# Patient Record
Sex: Female | Born: 1960
Health system: Southern US, Community
[De-identification: ages and names within clinical notes are randomized; demographics above are authoritative.]

## PROBLEM LIST (undated history)

## (undated) DIAGNOSIS — F419 Anxiety disorder, unspecified: Secondary | ICD-10-CM

## (undated) DIAGNOSIS — R011 Cardiac murmur, unspecified: Secondary | ICD-10-CM

## (undated) DIAGNOSIS — I10 Essential (primary) hypertension: Secondary | ICD-10-CM

## (undated) DIAGNOSIS — E785 Hyperlipidemia, unspecified: Secondary | ICD-10-CM

## (undated) DIAGNOSIS — K219 Gastro-esophageal reflux disease without esophagitis: Secondary | ICD-10-CM

## (undated) DIAGNOSIS — K8010 Calculus of gallbladder with chronic cholecystitis without obstruction: Secondary | ICD-10-CM

## (undated) DIAGNOSIS — R079 Chest pain, unspecified: Secondary | ICD-10-CM

## (undated) DIAGNOSIS — C801 Malignant (primary) neoplasm, unspecified: Secondary | ICD-10-CM

## (undated) HISTORY — PX: UPPER GASTROINTESTINAL ENDOSCOPY: SHX188

## (undated) HISTORY — DX: Malignant (primary) neoplasm, unspecified: C80.1

## (undated) HISTORY — DX: Calculus of gallbladder with chronic cholecystitis without obstruction: K80.10

## (undated) HISTORY — DX: Gastro-esophageal reflux disease without esophagitis: K21.9

## (undated) HISTORY — PX: COLONOSCOPY: SHX174

## (undated) HISTORY — PX: ABDOMINAL HYSTERECTOMY: SHX81

## (undated) HISTORY — DX: Chest pain, unspecified: R07.9

## (undated) HISTORY — PX: ABDOMINAL SURGERY: SHX537

## (undated) HISTORY — DX: Cardiac murmur, unspecified: R01.1

---

## 1997-07-25 ENCOUNTER — Ambulatory Visit (HOSPITAL_COMMUNITY): Admission: RE | Admit: 1997-07-25 | Discharge: 1997-07-25 | Payer: Self-pay | Admitting: *Deleted

## 1997-10-13 ENCOUNTER — Encounter: Admission: RE | Admit: 1997-10-13 | Discharge: 1998-01-11 | Payer: Self-pay | Admitting: Obstetrics and Gynecology

## 1997-11-17 ENCOUNTER — Inpatient Hospital Stay (HOSPITAL_COMMUNITY): Admission: AD | Admit: 1997-11-17 | Discharge: 1997-11-17 | Payer: Self-pay | Admitting: Obstetrics and Gynecology

## 1997-12-12 ENCOUNTER — Encounter (HOSPITAL_COMMUNITY): Admission: RE | Admit: 1997-12-12 | Discharge: 1997-12-28 | Payer: Self-pay | Admitting: Obstetrics and Gynecology

## 1997-12-19 ENCOUNTER — Inpatient Hospital Stay (HOSPITAL_COMMUNITY): Admission: AD | Admit: 1997-12-19 | Discharge: 1997-12-19 | Payer: Self-pay | Admitting: Obstetrics and Gynecology

## 1997-12-28 ENCOUNTER — Inpatient Hospital Stay (HOSPITAL_COMMUNITY): Admission: AD | Admit: 1997-12-28 | Discharge: 1997-12-30 | Payer: Self-pay | Admitting: Obstetrics and Gynecology

## 1998-02-27 ENCOUNTER — Ambulatory Visit (HOSPITAL_COMMUNITY): Admission: RE | Admit: 1998-02-27 | Discharge: 1998-02-27 | Payer: Self-pay | Admitting: Obstetrics and Gynecology

## 1999-08-25 ENCOUNTER — Emergency Department (HOSPITAL_COMMUNITY): Admission: EM | Admit: 1999-08-25 | Discharge: 1999-08-25 | Payer: Self-pay | Admitting: Emergency Medicine

## 1999-10-24 ENCOUNTER — Ambulatory Visit (HOSPITAL_COMMUNITY): Admission: RE | Admit: 1999-10-24 | Discharge: 1999-10-24 | Payer: Self-pay | Admitting: Gastroenterology

## 1999-11-28 ENCOUNTER — Other Ambulatory Visit: Admission: RE | Admit: 1999-11-28 | Discharge: 1999-11-28 | Payer: Self-pay | Admitting: Obstetrics and Gynecology

## 2002-07-05 ENCOUNTER — Other Ambulatory Visit: Admission: RE | Admit: 2002-07-05 | Discharge: 2002-07-05 | Payer: Self-pay | Admitting: Obstetrics and Gynecology

## 2003-08-16 ENCOUNTER — Ambulatory Visit (HOSPITAL_COMMUNITY): Admission: RE | Admit: 2003-08-16 | Discharge: 2003-08-16 | Payer: Self-pay | Admitting: Obstetrics and Gynecology

## 2003-08-21 ENCOUNTER — Other Ambulatory Visit: Admission: RE | Admit: 2003-08-21 | Discharge: 2003-08-21 | Payer: Self-pay | Admitting: Obstetrics and Gynecology

## 2004-10-29 ENCOUNTER — Emergency Department (HOSPITAL_COMMUNITY): Admission: EM | Admit: 2004-10-29 | Discharge: 2004-10-30 | Payer: Self-pay | Admitting: Emergency Medicine

## 2004-12-18 ENCOUNTER — Other Ambulatory Visit: Admission: RE | Admit: 2004-12-18 | Discharge: 2004-12-18 | Payer: Self-pay | Admitting: Obstetrics and Gynecology

## 2005-01-28 ENCOUNTER — Observation Stay (HOSPITAL_COMMUNITY): Admission: AD | Admit: 2005-01-28 | Discharge: 2005-01-29 | Payer: Self-pay | Admitting: Obstetrics and Gynecology

## 2005-02-04 ENCOUNTER — Encounter (INDEPENDENT_AMBULATORY_CARE_PROVIDER_SITE_OTHER): Payer: Self-pay | Admitting: *Deleted

## 2005-02-04 ENCOUNTER — Observation Stay (HOSPITAL_COMMUNITY): Admission: RE | Admit: 2005-02-04 | Discharge: 2005-02-05 | Payer: Self-pay | Admitting: Obstetrics and Gynecology

## 2005-08-14 ENCOUNTER — Ambulatory Visit (HOSPITAL_COMMUNITY): Admission: RE | Admit: 2005-08-14 | Discharge: 2005-08-14 | Payer: Self-pay | Admitting: Obstetrics and Gynecology

## 2005-08-14 IMAGING — MG MM DIGITAL SCREENING BILAT
5 series · 5 of 5 positions shown · non-contrast
Comparison: none

Bilateral CC and MLO view(s) were taken.

SCREENING MAMMOGRAM:
There is a fibroglandular pattern.  No masses or malignant type calcifications are identified.  
Compared with prior studies.

[R CC]
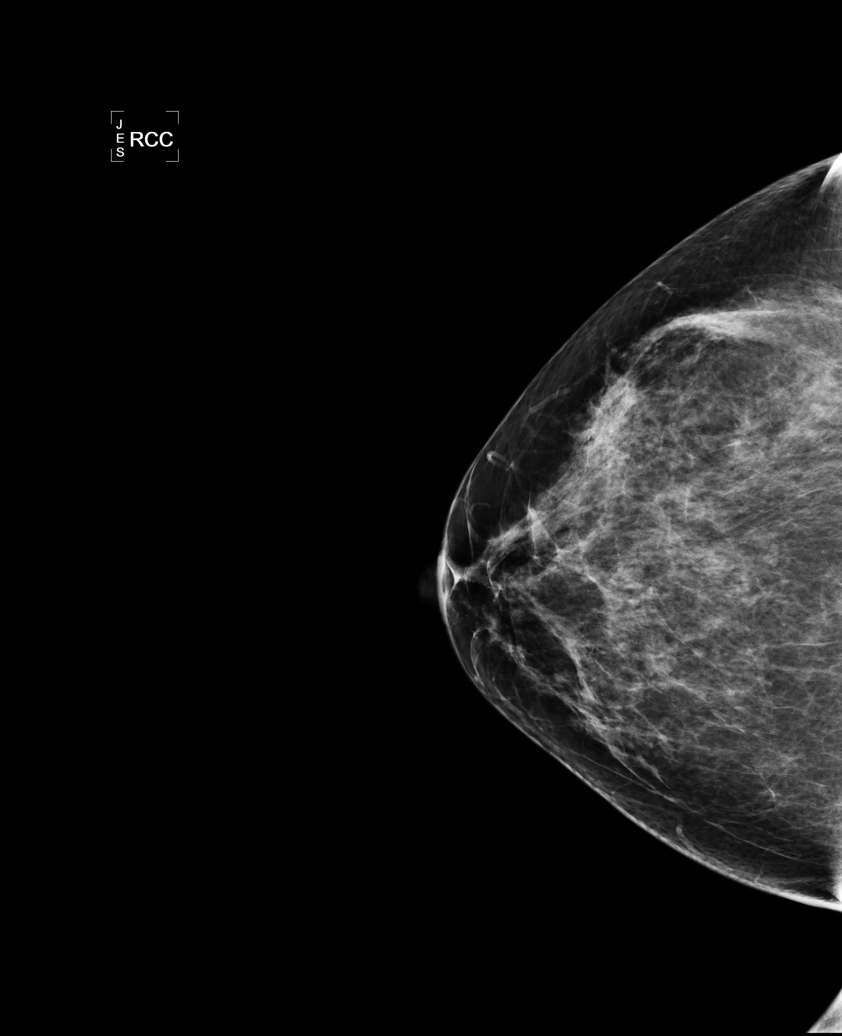

[R MLO]
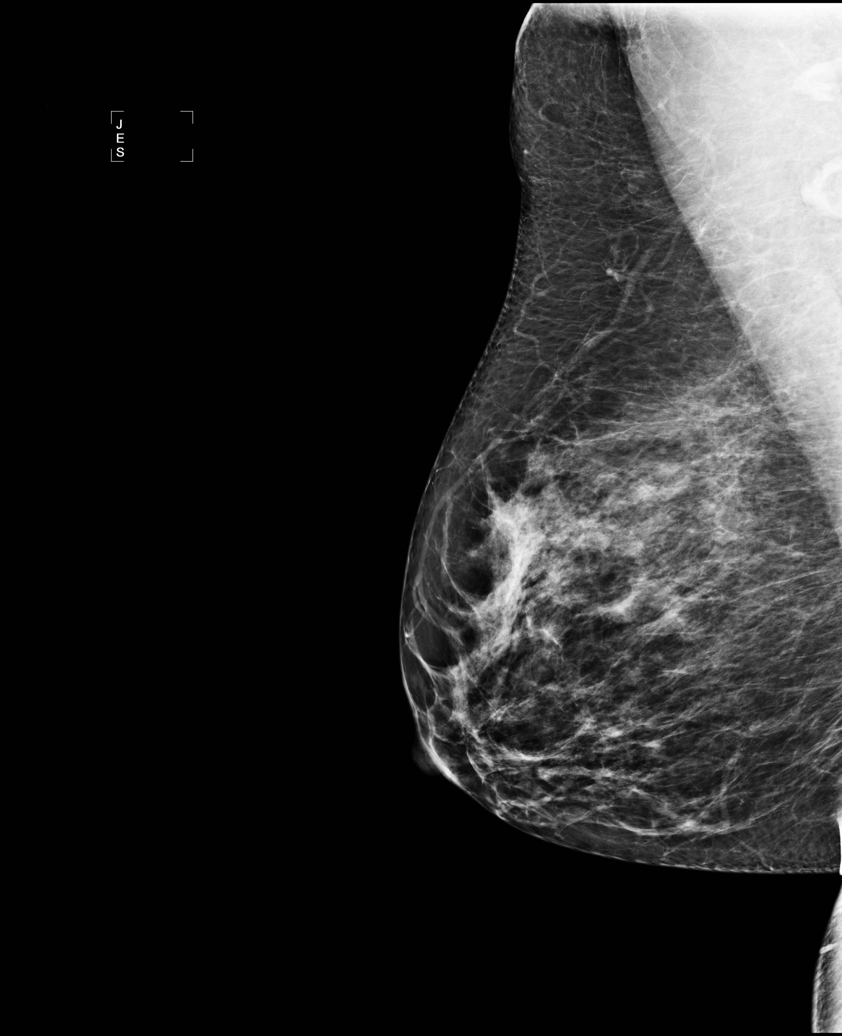

[L CC]
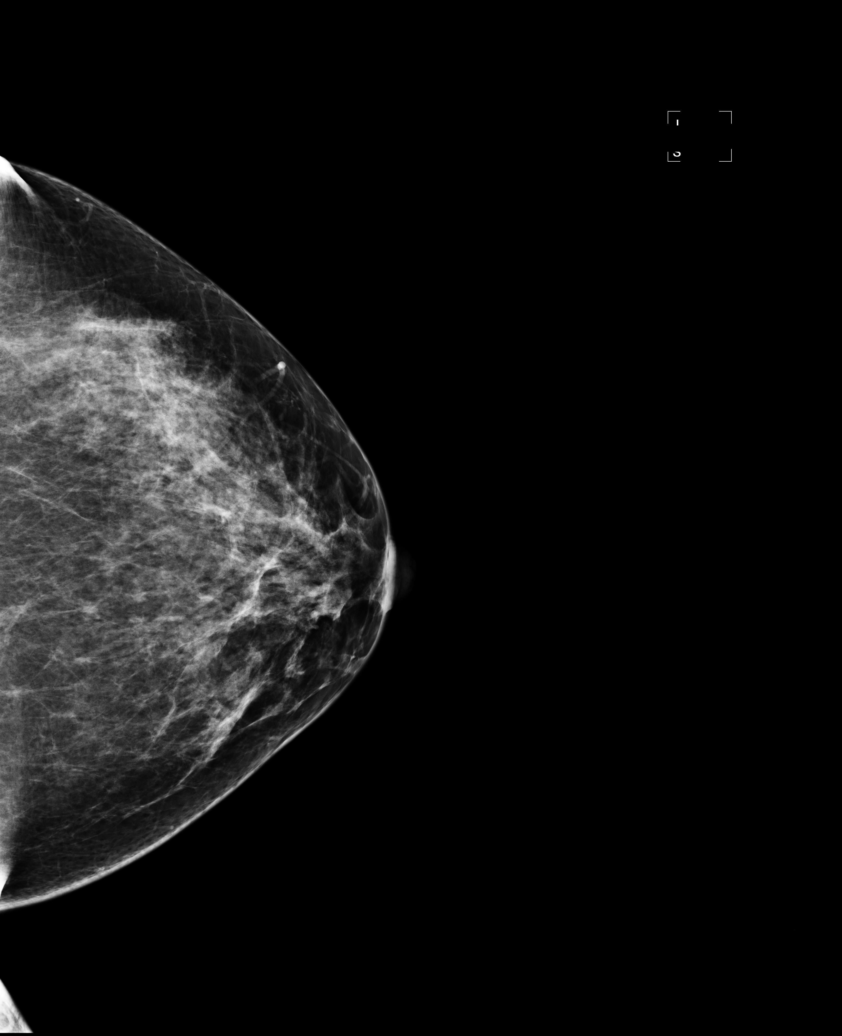

[L MLO (1 of 2)]
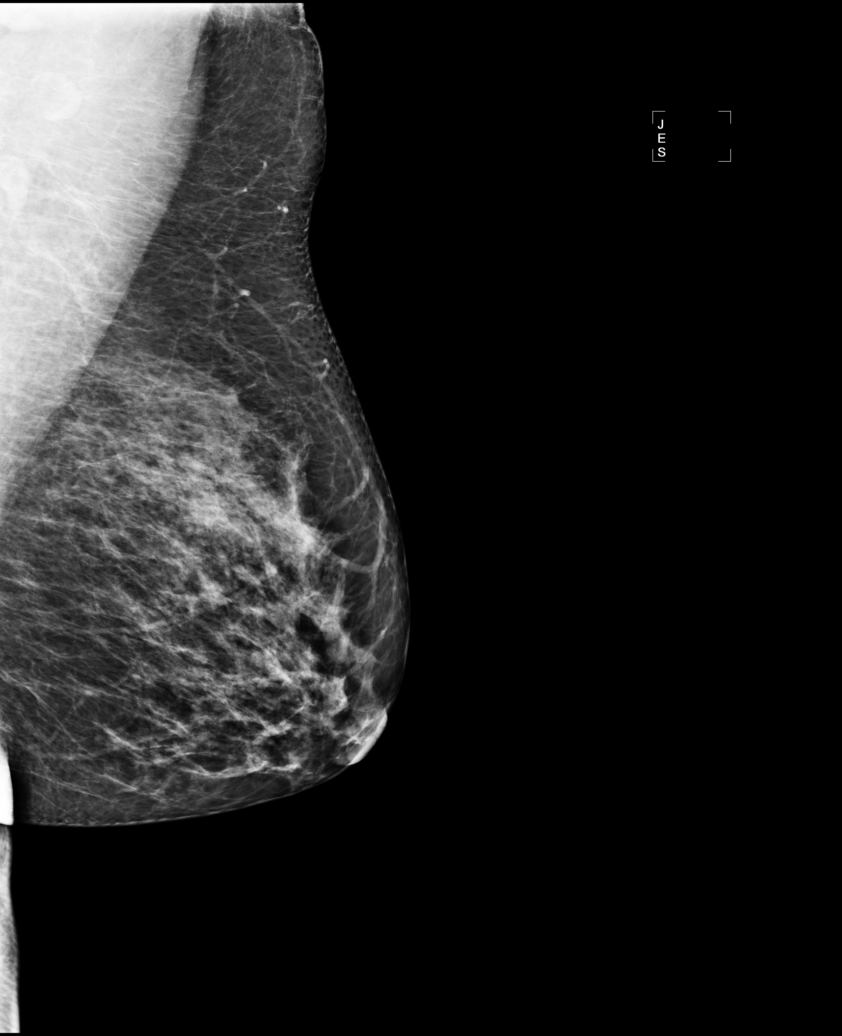

[L MLO (2 of 2)]
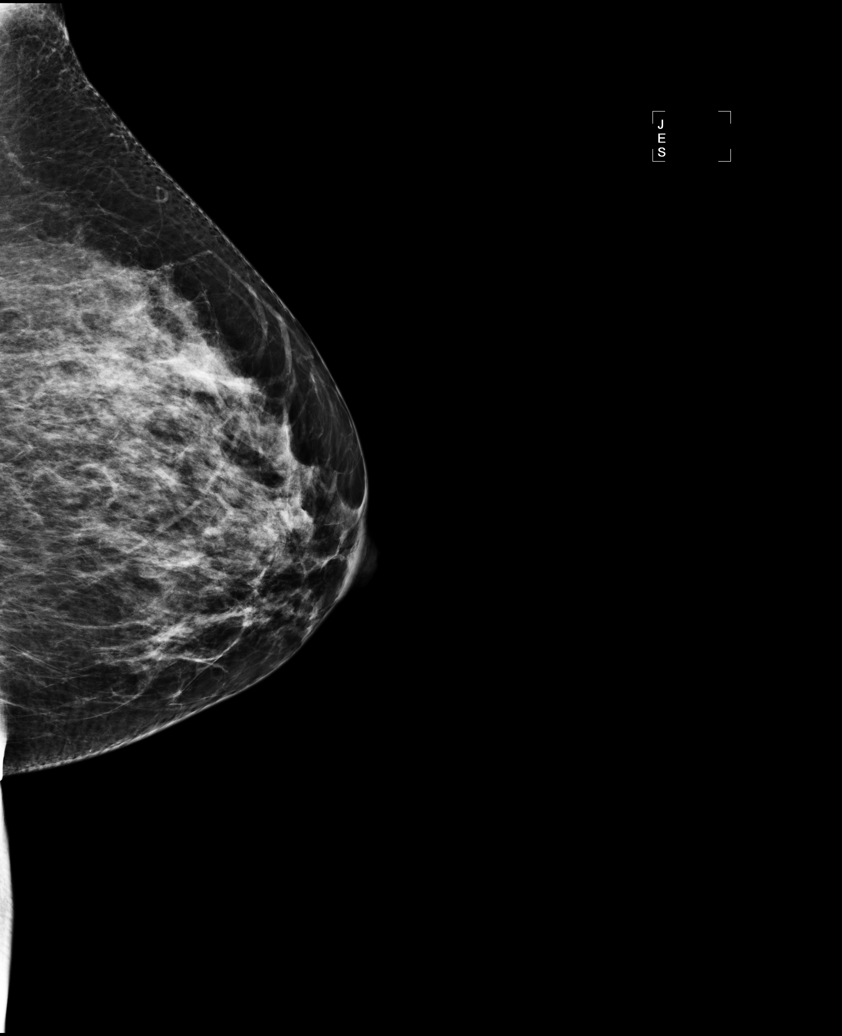

[5 of 5 positions shown; findings below may reference images not displayed]

IMPRESSION: No specific mammographic evidence of malignancy.  Next screening mammogram is recommended in one 
year.

ASSESSMENT: Negative - BI-RADS 1

Screening mammogram in 1 year.

## 2005-09-01 ENCOUNTER — Other Ambulatory Visit: Admission: RE | Admit: 2005-09-01 | Discharge: 2005-09-01 | Payer: Self-pay | Admitting: Obstetrics and Gynecology

## 2008-08-12 ENCOUNTER — Emergency Department (HOSPITAL_COMMUNITY): Admission: EM | Admit: 2008-08-12 | Discharge: 2008-08-12 | Payer: Self-pay | Admitting: Emergency Medicine

## 2008-08-16 ENCOUNTER — Other Ambulatory Visit: Admission: RE | Admit: 2008-08-16 | Discharge: 2008-08-16 | Payer: Self-pay | Admitting: Obstetrics and Gynecology

## 2008-08-16 ENCOUNTER — Ambulatory Visit: Payer: Self-pay | Admitting: Obstetrics and Gynecology

## 2008-08-16 LAB — CONVERTED CEMR LAB: Trich, Wet Prep: NONE SEEN

## 2008-09-07 ENCOUNTER — Ambulatory Visit: Payer: Self-pay | Admitting: Obstetrics and Gynecology

## 2010-04-03 ENCOUNTER — Encounter
Admission: RE | Admit: 2010-04-03 | Discharge: 2010-04-03 | Payer: Self-pay | Source: Home / Self Care | Admitting: Obstetrics and Gynecology

## 2010-04-03 IMAGING — MG MM DIGITAL SCREENING
5 series · 5 of 5 positions shown · non-contrast
Comparison: none

DG SCREEN MAMMOGRAM BILATERAL
Bilateral CC and MLO view(s) were taken.

DIGITAL SCREENING MAMMOGRAM WITH CAD:
There are scattered fibroglandular densities.  No masses or malignant type calcifications are 
identified.  Compared with prior studies.
Images were processed with CAD.

[R CC]
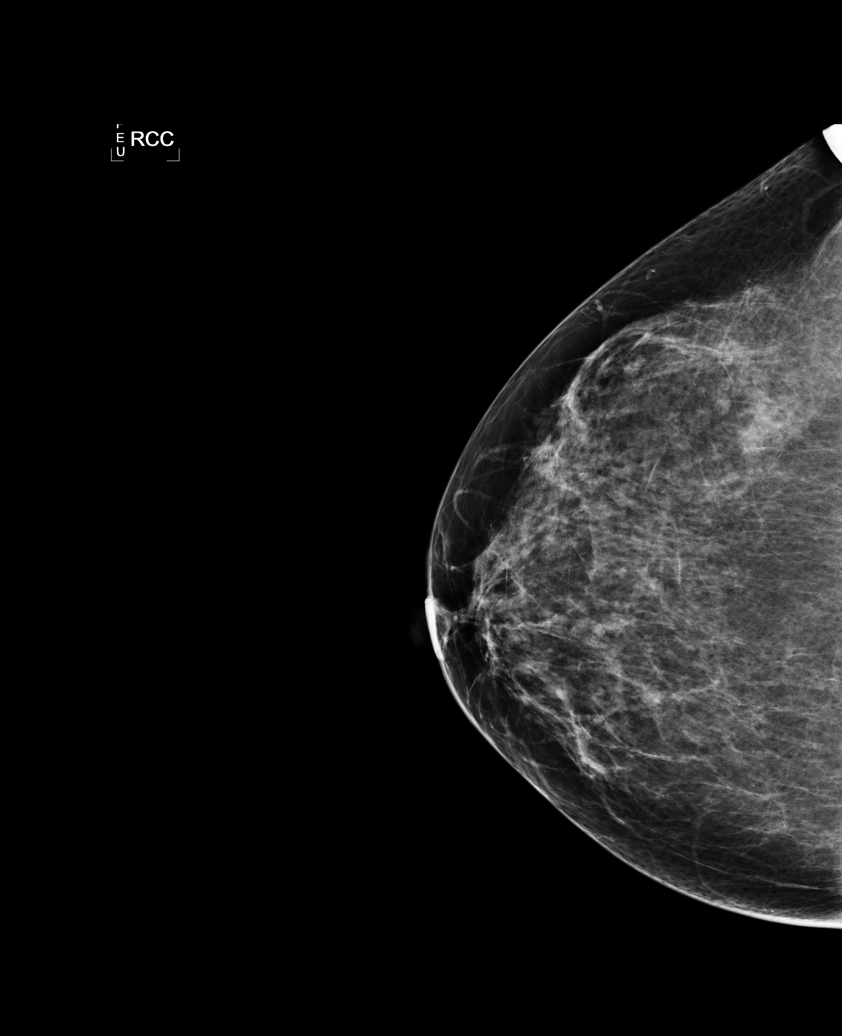

[L CC]
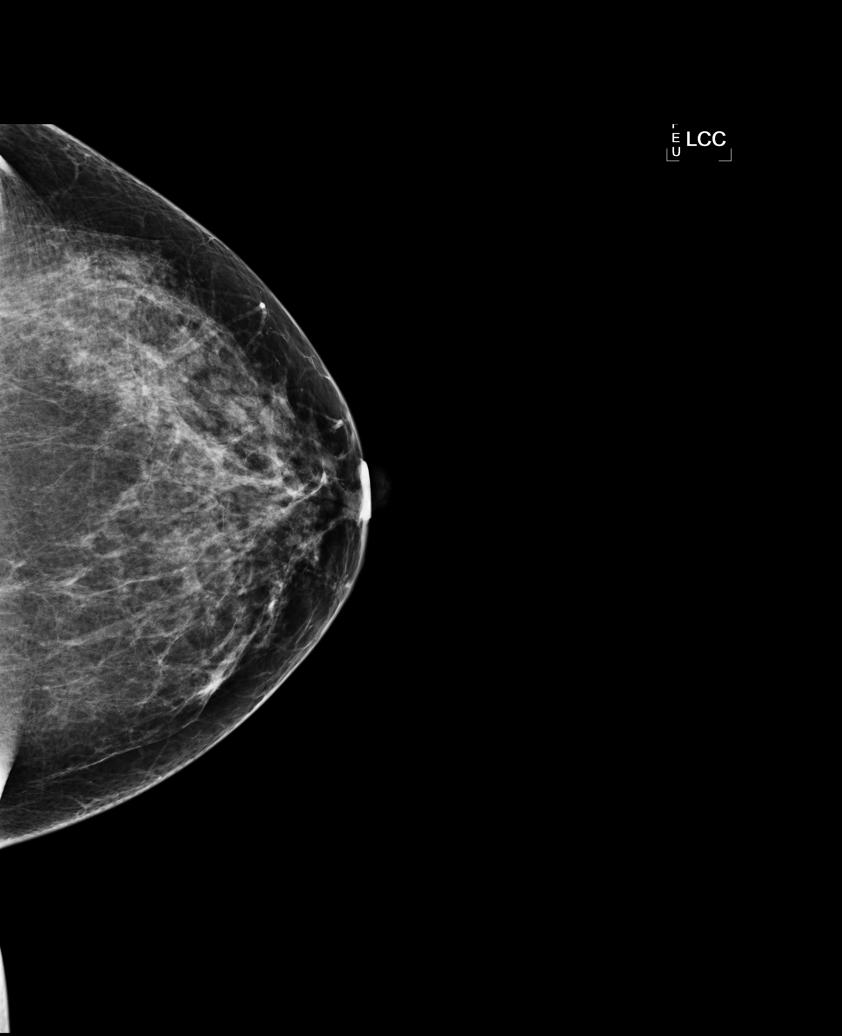

[L MLO (1 of 2)]
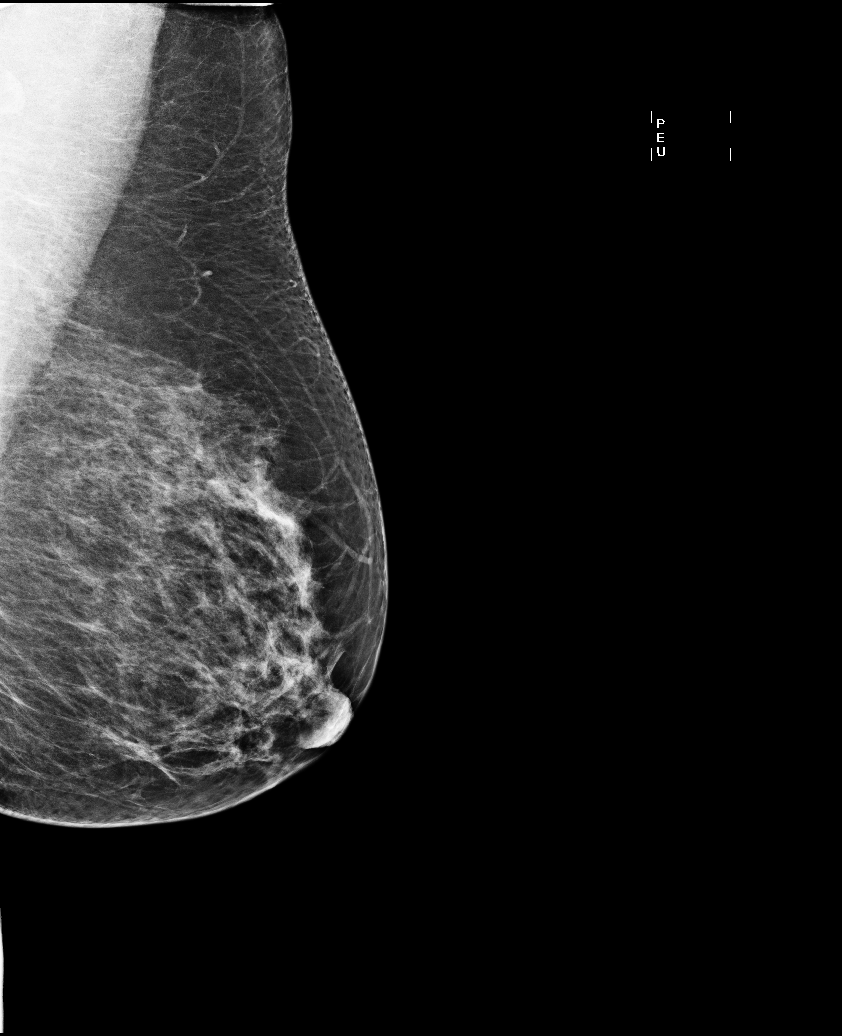

[R MLO]
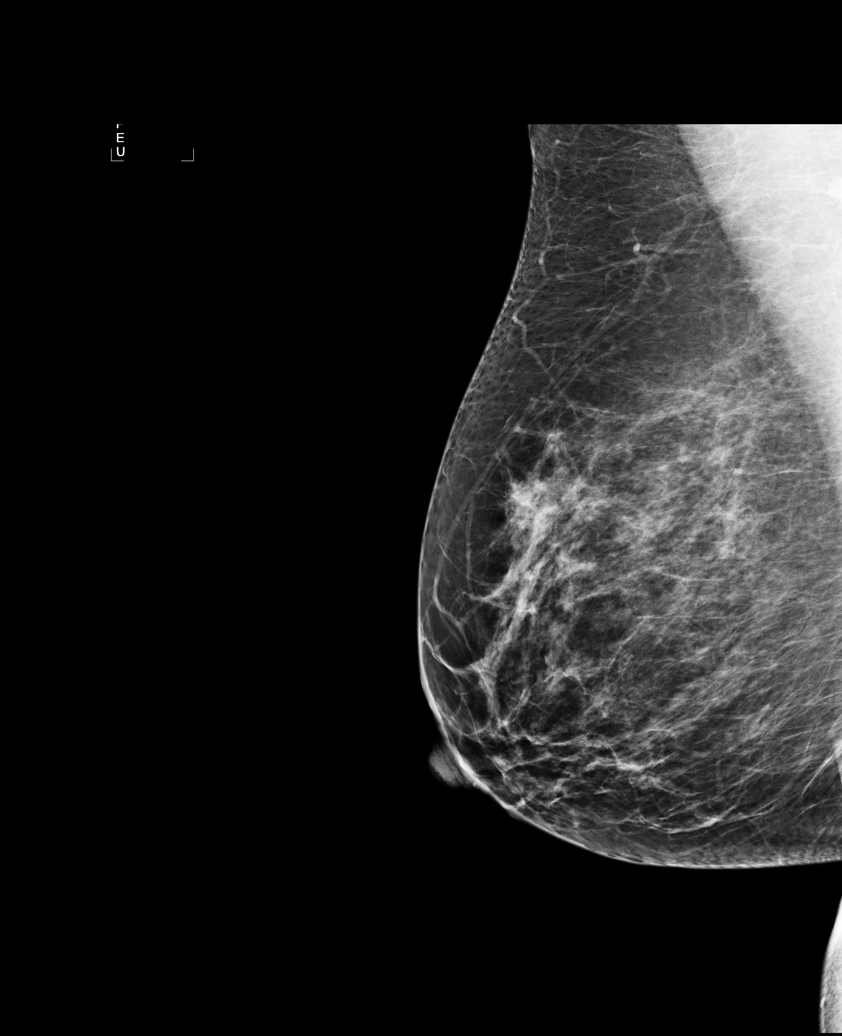

[L MLO (2 of 2)]
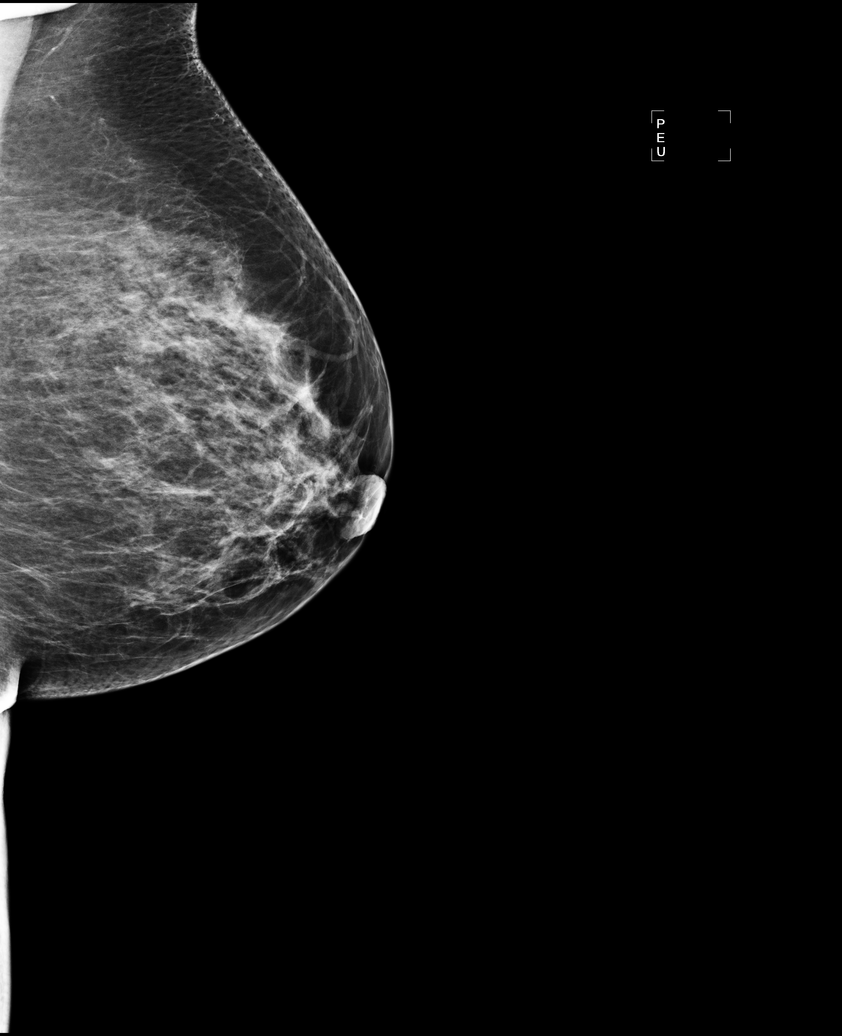

[5 of 5 positions shown; findings below may reference images not displayed]

IMPRESSION: No specific mammographic evidence of malignancy.  Next screening mammogram is recommended in one 
year.

A result letter of this screening mammogram will be mailed directly to the patient.

ASSESSMENT: Negative - BI-RADS 1

Screening mammogram in 1 year.
,

## 2010-08-07 LAB — WET PREP, GENITAL
Clue Cells Wet Prep HPF POC: NONE SEEN
Trich, Wet Prep: NONE SEEN
Yeast Wet Prep HPF POC: NONE SEEN

## 2010-09-10 NOTE — Group Therapy Note (Signed)
NAME:  Christina Burke, Christina Burke NO.:  [id number]   MEDICAL RECORD NO.:  [id number]          PATIENT TYPE:  WOC   LOCATION:  [facility]                   FACILITY:  [facility]   PHYSICIAN:  Deirdre Christy Gentles, CNM       DATE OF BIRTH:  21-Aug-1960   DATE OF SERVICE:                                  CLINIC NOTE   REASON FOR VISIT:  She is here for a vulvar biopsy after being evaluated  at [facility].   HISTORY:  This is a 50 year old G3, P 2-0-1-2 who presented to [facility] with a 79-month history of itching of the  perivulvar region and white skin discoloration.  She self-treated with  OTC antifungal medications with no relief.  She was seen 08/10/2008 at  [facility] and was noted to have cyst at the vaginal cuff and  vulvar perineal lesions that were thought to be vitiligo versus lichen  sclerosus.  Dr. Penne Lash examined her there and felt that she needed  biopsy.   ALLERGIES:  AMOXICILLIN.   CURRENT MEDICATIONS:  1. Clonazepam.  2. Unknown type of blood pressure pill.   IMMUNIZATIONS:  Usual childhood immunizations.   Surgical menopause in 2006.  No vaginal bleeding.   GYNECOLOGIC HISTORY:  Significant for having had a LAVH by Dr. Jackelyn Knife  for abnormal uterine bleeding and pathology report showed endometrioid  carcinoma.  No other surgeries.   FAMILY HISTORY:  Significant for the parents and maternal grandmother  for heart disease.  Father and grandmother with MI.  Hypertension in  mother, father, and brother.  Cancer in both parents.  Blood clots in  father and sister.   PAST MEDICAL HISTORY:  Significant for heart murmur, hypertension and  endometrioid carcinoma as above.   SOCIAL HISTORY:  Does not work outside the home.  Lives with 2 sons age  47 and 25.  Nonsmoker.  Drinks occasional alcohol, caffeine once a day.  Negative history of illicit drug use.   REVIEW OF SYSTEMS:  Positive for some mild SUI and feeling short  of  breath during the periods of anxiety and some night sweats, but no  severe hot flashes.   PHYSICAL EXAMINATION:  Physical exam was done August 10, 2008.  Today her  blood pressure is 142/87, her weight is 210 and her height 5 feet 3  inches.  She is afebrile.  Dr. Okey Dupre was present for speculum exam and  exam of external genitalia.  Vaginal cuff is well visualized, thought to  be some small inclusion cyst posteriorly, for which the patient is  reassured, is of no real concerns.  Both labia majora have white skin  discoloration and some neurodermatitis from scratching.  This whiteness  extends to perineum.  In consultation with Dr. Okey Dupre.  Biopsy was done,  the left labium majus at fourchette.   PROCEDURE NOTE:  Using sterile technique the area is cleansed with  Betadine and it is then infiltrated with 1% Xylocaine.  A punch biopsy  was undertaken and with take pickups grasping about 2 mm area, which  easily twisted off.  There was slight bleeding and silver nitrate stick  used  to achieve hemostasis.  The patient tolerated procedure very well.   ASSESSMENT:  Vulvar dystrophy.   PLAN:  Await path results and have the patient return in 2 weeks.  She  should return if she has severe bleeding or pain from biopsy site.  We  will also find the results of her path of cervical cuff as well as GC  chlamydia, which were done August 10, 2008 at [facility].           ______________________________  Caren Griffins, CNM     DP/MEDQ  D:  08/16/2008  T:  08/17/2008  Job:  [id number]

## 2010-09-13 NOTE — H&P (Signed)
Christina Burke, Christina Burke              ACCOUNT NO.:  [id number]   MEDICAL RECORD NO.:  [id number]           PATIENT TYPE:   LOCATION:                                 FACILITY:   PHYSICIAN:  Zenaida Niece, M.D.DATE OF BIRTH:  [birth year]   DATE OF ADMISSION:  02/04/2005  DATE OF DISCHARGE:                                HISTORY & PHYSICAL   CHIEF COMPLAINT:  Menorrhagia with anemia and possible fibroids.   HISTORY OF PRESENT ILLNESS:  This is a 50 year old, white female, P2-0-2-2  who was seen for an annual exam on December 18, 2004.  At that time, she was  saying that she had menses every month which were fairly heavy and that she  had recently bled for approximately 6 weeks.  She does have a ParaGard IUD  in proper position.  Hemoglobin at that visit was 7.7.  She was counseled on  all options and did decide to proceed with definitive surgical therapy.  At  her preop visit on October 3, she complained of significant shortness of  breath and she had just finished a menses.  Hemoglobin in the office was  6.5.  She was thus sent to the hospital and transfused with 3 units of  packed red blood cells in preparation for surgery.   PAST OBSTETRICAL HISTORY:  Significant for two vaginal deliveries at term  without complications.  She did have gestational diabetes with both  pregnancies.  She has also had one miscarriage and one elected termination.   PAST MEDICAL HISTORY:  1.  Remote history of a heart murmur.  2.  She does have recent depression.   PAST SURGICAL HISTORY:  She has had a laparoscopy in 1993, for infertility  and denies other surgeries.   ALLERGIES:  PENICILLIN causes a rash.   CURRENT MEDICATIONS:  Paxil 20 mg daily.   FAMILY HISTORY:  Mother had uterine cancer.  Maternal aunt had breast  cancer.   REVIEW OF SYSTEMS:  She does have some urinary frequency and normal bowel  movements.   SOCIAL HISTORY:  She is married and denies alcohol, tobacco or drug use.   PHYSICAL EXAMINATION:  VITAL SIGNS:  Weight is approximately 180 pounds.  Last blood pressure was 140/110, pulse 80.  GENERAL:  This is a pale, white female in no acute distress.  NECK:  Supple without lymphadenopathy or thyromegaly.  LUNGS:  Clear to auscultation.  HEART:  Regular rate and rhythm with a 3/6 systolic murmur.  ABDOMEN:  Soft, nontender, nondistended without palpable masses.  EXTREMITIES:  No edema and are nontender.  PELVIC:  External genitalia reveals normal cervix.  On speculum exam, the  cervix is normal with ParaGard in proper position with strings at  approximately 3 cm.  On bimanual exam, she has an anteverted, slightly  enlarged irregular uterus and no adnexal masses.  This is confirmed by  rectovaginal exam.   ASSESSMENT:  Abnormal uterine bleeding with significant anemia that has  required blood transfusion.  All options have been discussed with the  patient and she agrees to undergo surgical therapy with hysterectomy.  Risks  of surgery including bleeding, infection and damage to surrounding organs  have been discussed with the patient.   PLAN:  Plan is to admit the patient on the day of surgery for a vaginal  hysterectomy and cystoscopy.      Zenaida Niece, M.D.  Electronically Signed     TDM/MEDQ  D:  02/03/2005  T:  02/03/2005  Job:  [id number]

## 2010-09-13 NOTE — Procedures (Signed)
[facility]  Patient:    Christina Burke, Christina Burke                     MRN: [id number] Proc. Date: 10/24/99 Adm. Date:  [id number] Attending:  Charna Elizabeth CC:         Reuben Likes, M.D.                           Procedure Report  DATE OF BIRTH:  09-30-1960  REFERRING PHYSICIAN:  Reuben Likes, M.D.  PROCEDURE PERFORMED:  Colonoscopy.  ENDOSCOPIST:  Anselmo Rod, M.D.  INSTRUMENT USED:  Olympus video colonoscope.  INDICATIONS FOR PROCEDURE:  A 50 year old white female with a history of questionable proctitis on a recent flexible sigmoidoscopy, rule out UC.  PREPROCEDURE PREPARATION:  Informed consent was procured from the patient. The patient was fasted for eight hours prior to the procedure, and prepped with a bottle of magnesium citrate and a gallon of NuLytely the night prior to the procedure.  PREPROCEDURE PHYSICAL:  VITAL SIGNS:  Stable.  NECK:  Supple.  CHEST:  Clear to auscultation, S1 and S2 regular.  ABDOMEN:  Soft with normal abdominal bowel sounds.  DESCRIPTION OF PROCEDURE:  The patient was placed in the left lateral decubitus position, and sedated with Demerol and Versed for the colonoscopy. Once the patient was adequately sedated, maintained on low flow oxygen and continuous cardiac monitoring, the Olympus video colonoscope was advanced from the rectum to the cecum, and terminal ileum without difficulty.  The entire colonic mucosa appeared healthy without any evidence of erosions, ulcerations, masses, or polyps.  The patient tolerated the procedure well.  The terminal ileum also appeared normal.  IMPRESSION:  Normal colonoscopy to terminal ileum.  RECOMMENDATIONS: 1. Continue high fiber diet. 2. Follow up with Reuben Likes, M.D. for further recommendations. 3. The patient is to contact the office if she has any GI problems.  Her    diarrhea seems to have resolved at this time. DD:  10/24/99 TD:   10/25/99 Job: [id number]

## 2010-09-13 NOTE — Op Note (Signed)
Christina Burke, Christina Burke              ACCOUNT NO.:  [id number]   MEDICAL RECORD NO.:  [id number]          PATIENT TYPE:  INP   LOCATION:  9399                          FACILITY:  [facility]   PHYSICIAN:  Zenaida Niece, M.D.DATE OF BIRTH:  28-Jul-1960   DATE OF PROCEDURE:  02/04/2005  DATE OF DISCHARGE:                                 OPERATIVE REPORT   PREOPERATIVE DIAGNOSIS:  Abnormal uterine bleeding with anemia.   POSTOPERATIVE DIAGNOSIS:  Abnormal uterine bleeding with anemia.   PROCEDURES:  Transvaginal hysterectomy and cystoscopy.   SURGEON:  Zenaida Niece, M.D.   ASSISTANT:  Malachi Pro. Ambrose Mantle, M.D.   ANESTHESIA:  Spinal.   ESTIMATED BLOOD LOSS:  250 mL.   SPECIMENS:  Uterus.   COMPLICATIONS:  None.   FINDINGS:  She had a slightly enlarged uterus with normal tubes and ovaries  with a simple cyst on the right ovary. She had normal bladder and patent  ureters by cystoscopy.   PROCEDURE IN DETAIL:  The patient was taken to the operating room, placed in  the sitting position. Dr. Pamalee Leyden instilled spinal anesthesia and she was  placed in the dorsosupine position. Legs were then placed in mobile stirrups  and elevated. Perineum and vagina were then prepped and draped in the usual  sterile fashion and bladder drained with a red rubber catheter. The patient  did appear to have adequate anesthesia. Weighted speculum was inserted into  the vagina and the cervix was grasped with Christella Hartigan tenaculums. The  cervicovaginal mucosa was then infiltrated with a dilute solution of  Pitressin and then incised circumferentially with electrocautery. Sharp  dissection was then used to free the cervix and uterus from the bladder  anteriorly and then to enter the posterior cul-de-sac. A bonano speculum was  placed in the posterior cul-de-sac. Uterosacral ligaments were clamped,  transected and ligated on each side and tagged for later use. Uterine  arteries were clamped, transected and ligated on  each side with #1 chromic.  The bladder was pushed off anteriorly and anterior peritoneum was identified  and entered sharply. The Deaver retractor was then used to retract the  bladder anterior. Cardinal ligaments, broad ligaments were clamped,  transected and ligated on each side with #1 chromic. Uterus was then  delivered through the incision posteriorly. Utero-ovarian pedicles were  clamped and transected and the uterus was removed. Both utero-ovarian  pedicles were doubly ligated with #1 chromic. Bleeding from both sides was  controlled with figure-of-eight sutures of #1 chromic to achieve hemostasis.  Uterosacral ligaments were then plicated in the midline with 2-0 silk. A  simple cyst on the right ovary was incised with electrocautery and drained  clear fluid. The previously tagged uterosacral pedicles were then tied in  the midline. There was bleeding coming from the posterior vaginal cuff which  I felt would be closed with the vaginal closure. The vagina was then closed  in a vertical fashion with running locking 2-0 Vicryl with adequate closure  and adequate hemostasis.   Attention was turned to cystoscopy. The patient had been given indigo  carmine IV. The bladder  was drained of approximately 150 mL of blue-tinged  urine. The 70 degrees cystoscope was inserted. The bladder filled with  sterile solution. Both ureteral orifices were identified and were fairly  lateral. Indigo carmine was seen to flow freely from each orifice. There was  no obvious injury to the bladder. The cystoscope was removed and a Foley  catheter was placed. The patient tolerated the procedure well and was taken  to the recovery room in stable condition. All instrument counts were  correct, she received Ancef 1 gram prior to the procedure, she had PAS hose  on throughout procedure.      Zenaida Niece, M.D.  Electronically Signed     TDM/MEDQ  D:  02/04/2005  T:  02/04/2005  Job:  [id number]

## 2011-02-21 ENCOUNTER — Other Ambulatory Visit: Payer: Self-pay | Admitting: Obstetrics and Gynecology

## 2011-02-21 DIAGNOSIS — Z1231 Encounter for screening mammogram for malignant neoplasm of breast: Secondary | ICD-10-CM

## 2011-04-09 ENCOUNTER — Ambulatory Visit
Admission: RE | Admit: 2011-04-09 | Discharge: 2011-04-09 | Disposition: A | Discharge status: Home / Self Care | Payer: BC Managed Care – PPO | Source: Ambulatory Visit | Attending: Obstetrics and Gynecology | Admitting: Obstetrics and Gynecology

## 2011-04-09 DIAGNOSIS — Z1231 Encounter for screening mammogram for malignant neoplasm of breast: Secondary | ICD-10-CM

## 2011-04-09 IMAGING — MG MM DIGITAL SCREENING BILAT
4 series · 4 of 4 positions shown · non-contrast
Comparison: none

DG SCREEN MAMMOGRAM BILATERAL
Bilateral CC and MLO view(s) were taken.

DIGITAL SCREENING MAMMOGRAM WITH CAD:
The breast tissue is heterogeneously dense.  No masses or malignant type calcifications are 
identified.  Compared with prior studies.
Images were processed with CAD.

[R CC]
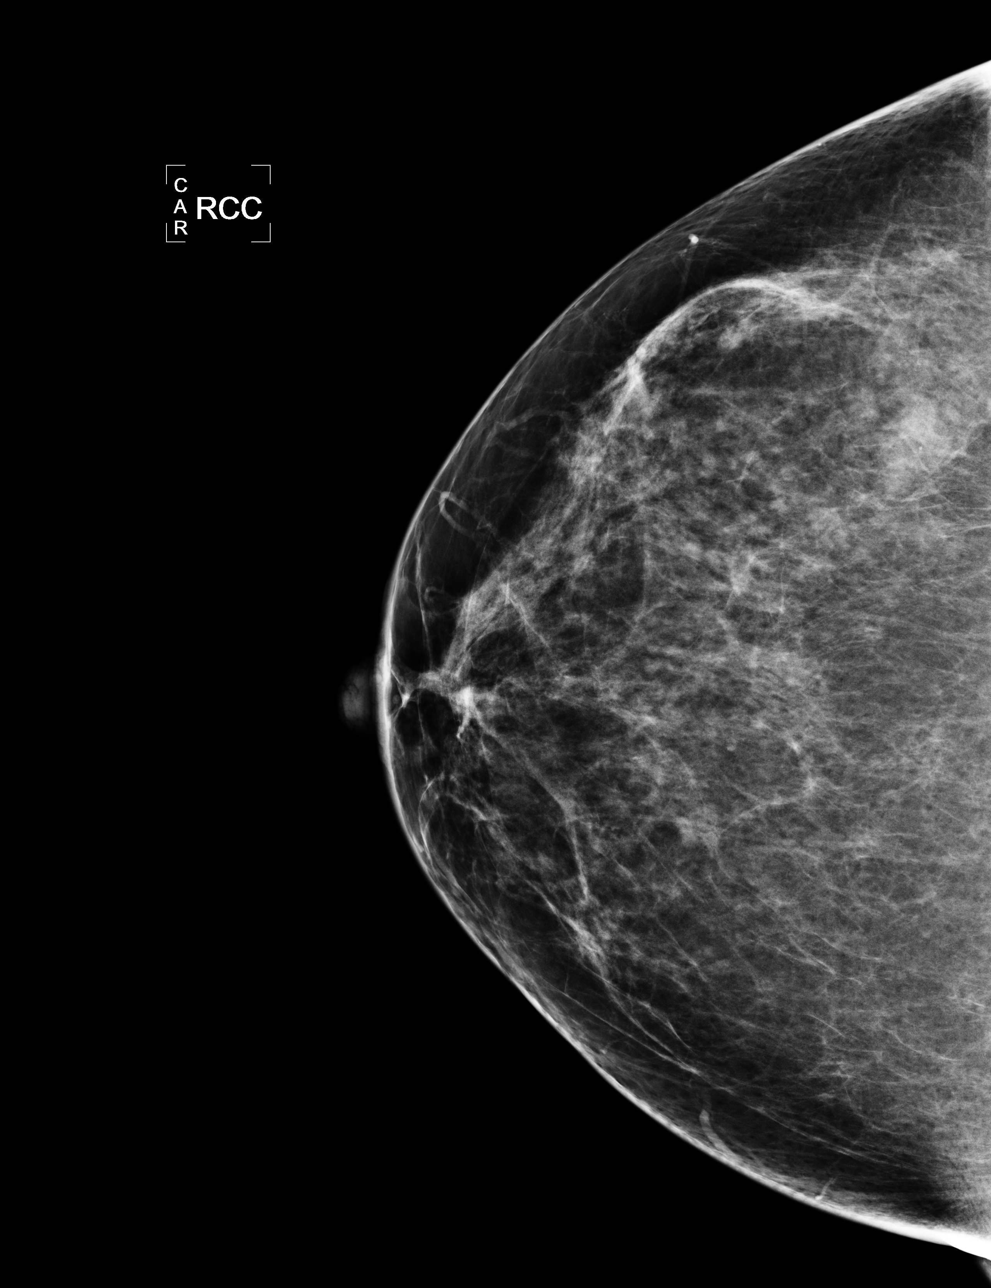

[L CC]
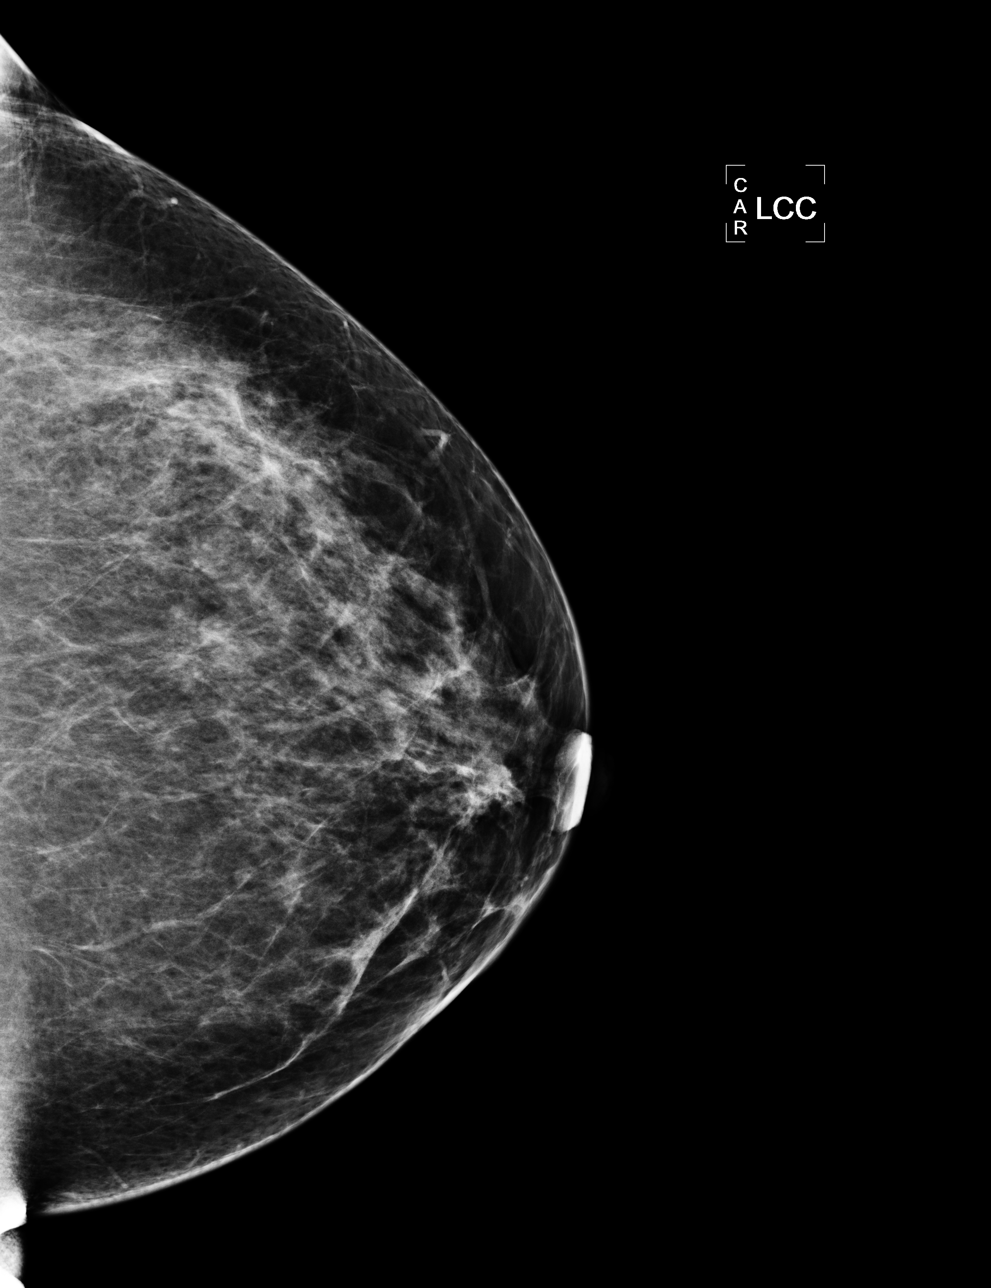

[L MLO]
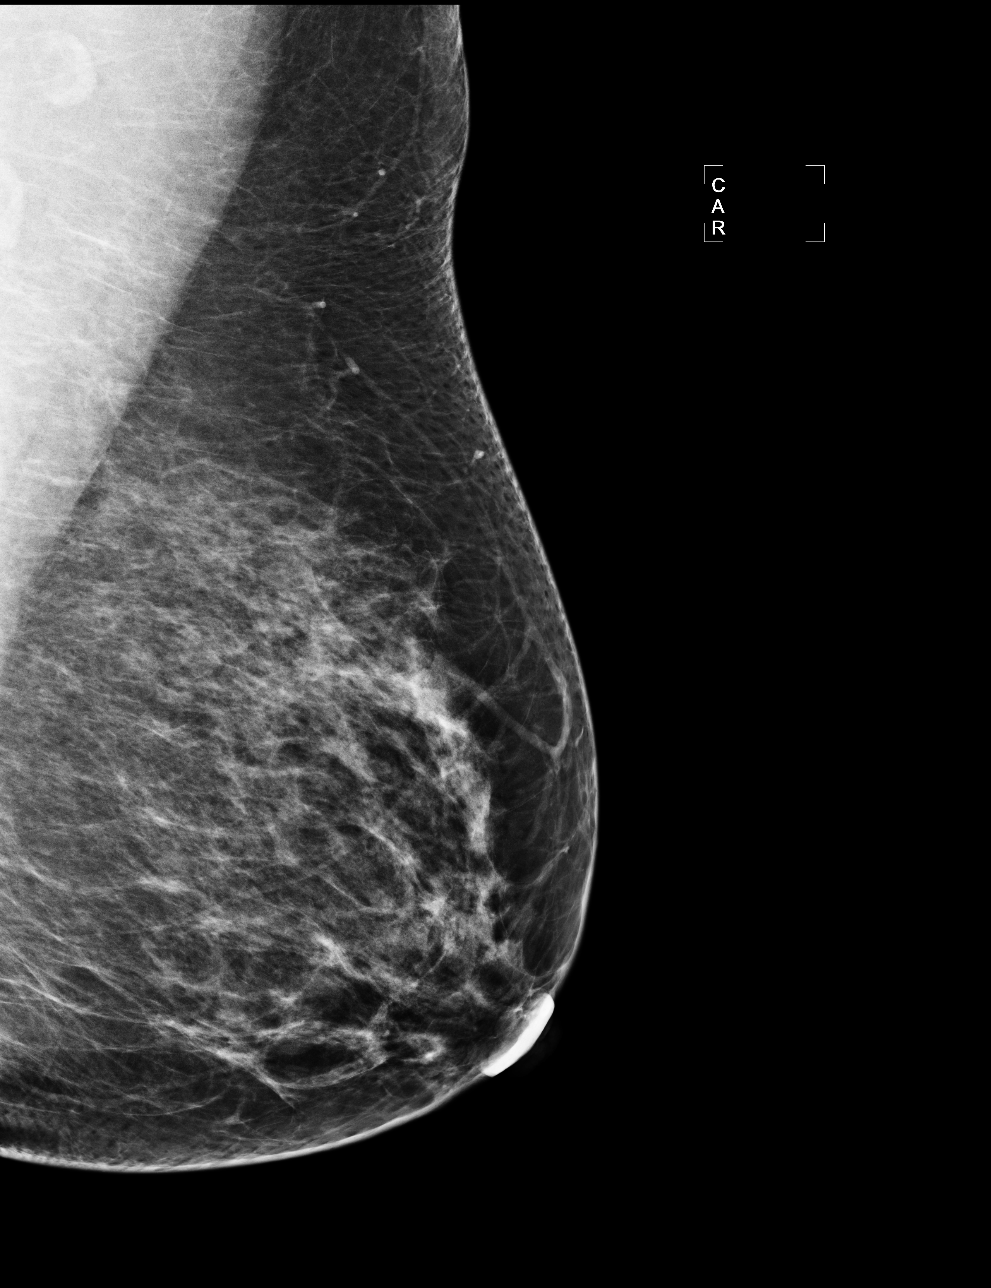

[R MLO]
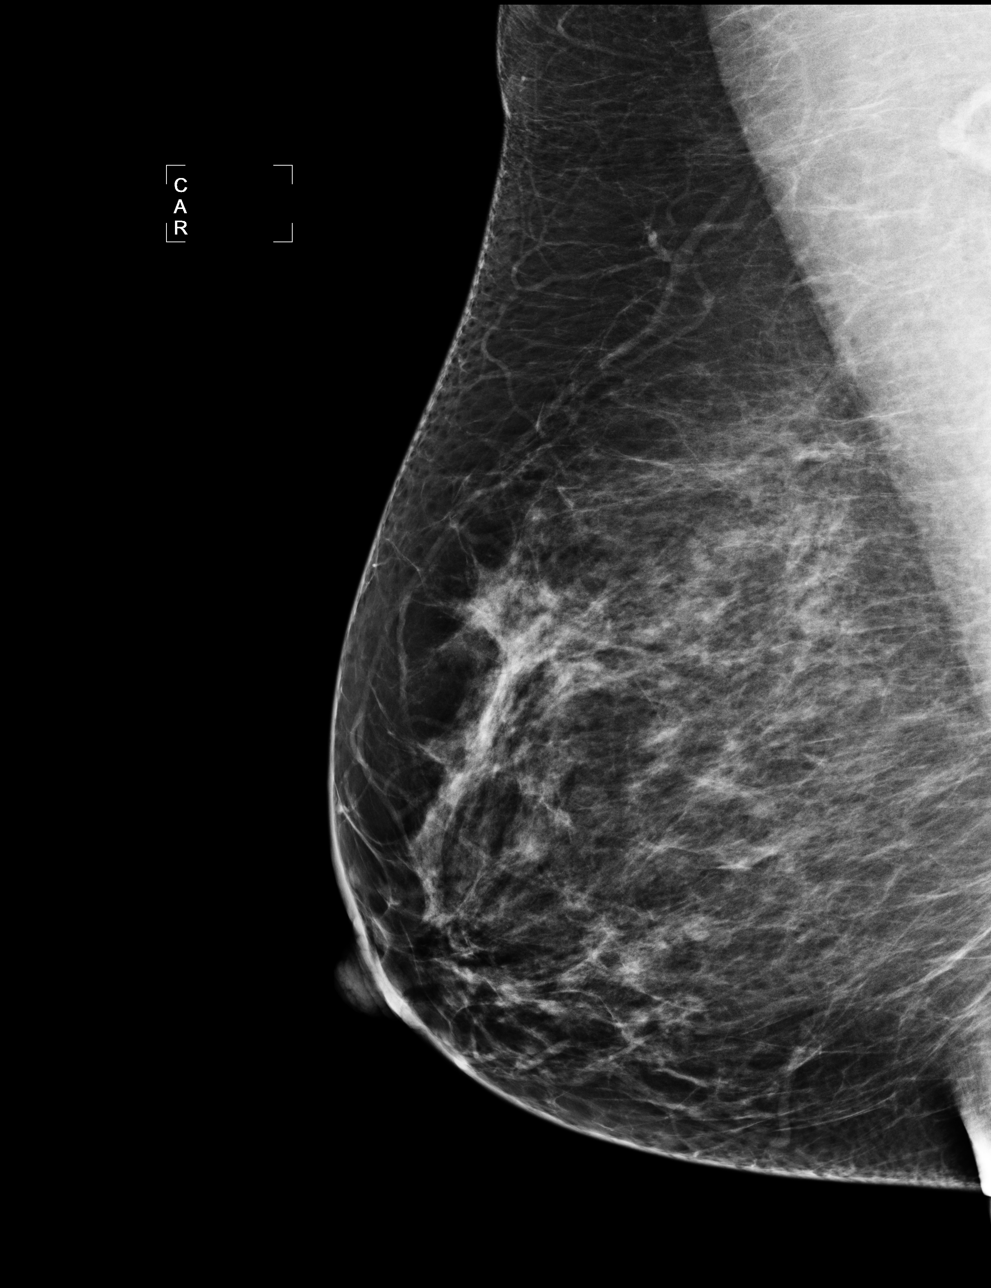

[4 of 4 positions shown; findings below may reference images not displayed]

IMPRESSION: No specific mammographic evidence of malignancy.  Next screening mammogram is recommended in one 
year.

A result letter of this screening mammogram will be mailed directly to the patient.

ASSESSMENT: Negative - BI-RADS 1

Screening mammogram in 1 year.
,

## 2013-07-24 ENCOUNTER — Emergency Department (HOSPITAL_COMMUNITY): Payer: Medicaid Other

## 2013-07-24 ENCOUNTER — Encounter (HOSPITAL_COMMUNITY): Payer: Self-pay | Admitting: Emergency Medicine

## 2013-07-24 ENCOUNTER — Emergency Department (HOSPITAL_COMMUNITY)
Admission: EM | Admit: 2013-07-24 | Discharge: 2013-07-24 | Disposition: A | Discharge status: Home / Self Care | Payer: Medicaid Other | Attending: Emergency Medicine | Admitting: Emergency Medicine

## 2013-07-24 DIAGNOSIS — I10 Essential (primary) hypertension: Secondary | ICD-10-CM | POA: Insufficient documentation

## 2013-07-24 DIAGNOSIS — E860 Dehydration: Secondary | ICD-10-CM | POA: Insufficient documentation

## 2013-07-24 DIAGNOSIS — Z8659 Personal history of other mental and behavioral disorders: Secondary | ICD-10-CM | POA: Insufficient documentation

## 2013-07-24 HISTORY — DX: Essential (primary) hypertension: I10

## 2013-07-24 HISTORY — DX: Anxiety disorder, unspecified: F41.9

## 2013-07-24 LAB — COMPREHENSIVE METABOLIC PANEL
ALK PHOS: 69 U/L (ref 39–117)
ALT: 17 U/L (ref 0–35)
AST: 19 U/L (ref 0–37)
Albumin: 4.1 g/dL (ref 3.5–5.2)
BUN: 15 mg/dL (ref 6–23)
CALCIUM: 9.1 mg/dL (ref 8.4–10.5)
CHLORIDE: 95 meq/L — AB (ref 96–112)
CO2: 24 mEq/L (ref 19–32)
CREATININE: 0.89 mg/dL (ref 0.50–1.10)
GFR calc Af Amer: 85 mL/min — ABNORMAL LOW (ref >90)
GFR calc non Af Amer: 73 mL/min — ABNORMAL LOW (ref >90)
Glucose, Bld: 178 mg/dL — ABNORMAL HIGH (ref 70–99)
Potassium: 3.5 mEq/L — ABNORMAL LOW (ref 3.7–5.3)
Sodium: 133 mEq/L — ABNORMAL LOW (ref 137–147)
TOTAL PROTEIN: 7.3 g/dL (ref 6.0–8.3)
Total Bilirubin: 0.3 mg/dL (ref 0.3–1.2)

## 2013-07-24 LAB — CBC WITH DIFFERENTIAL/PLATELET
BASOS PCT: 0 % (ref 0–1)
Basophils Absolute: 0 10*3/uL (ref 0.0–0.1)
EOS PCT: 0 % (ref 0–5)
Eosinophils Absolute: 0 10*3/uL (ref 0.0–0.7)
HEMATOCRIT: 38.3 % (ref 36.0–46.0)
Hemoglobin: 13 g/dL (ref 12.0–15.0)
LYMPHS ABS: 0.6 10*3/uL — AB (ref 0.7–4.0)
LYMPHS PCT: 10 % — AB (ref 12–46)
MCH: 30.6 pg (ref 26.0–34.0)
MCHC: 33.9 g/dL (ref 30.0–36.0)
MCV: 90.1 fL (ref 78.0–100.0)
Monocytes Absolute: 0.4 10*3/uL (ref 0.1–1.0)
Monocytes Relative: 6 % (ref 3–12)
Neutro Abs: 5.3 10*3/uL (ref 1.7–7.7)
Neutrophils Relative %: 83 % — ABNORMAL HIGH (ref 43–77)
Platelets: 181 10*3/uL (ref 150–400)
RBC: 4.25 MIL/uL (ref 3.87–5.11)
RDW: 13 % (ref 11.5–15.5)
WBC: 6.3 10*3/uL (ref 4.0–10.5)

## 2013-07-24 LAB — URINALYSIS, ROUTINE W REFLEX MICROSCOPIC
Bilirubin Urine: NEGATIVE
GLUCOSE, UA: NEGATIVE mg/dL
Hgb urine dipstick: NEGATIVE
KETONES UR: NEGATIVE mg/dL
Leukocytes, UA: NEGATIVE
Nitrite: NEGATIVE
PH: 5.5 (ref 5.0–8.0)
Protein, ur: NEGATIVE mg/dL
SPECIFIC GRAVITY, URINE: 1.022 (ref 1.005–1.030)
UROBILINOGEN UA: 0.2 mg/dL (ref 0.0–1.0)

## 2013-07-24 LAB — TROPONIN I

## 2013-07-24 LAB — I-STAT CG4 LACTIC ACID, ED: LACTIC ACID, VENOUS: 1.32 mmol/L (ref 0.5–2.2)

## 2013-07-24 IMAGING — CR DG CHEST 2V
2 series · 2 of 2 positions shown · non-contrast
Comparison: None

CLINICAL DATA: Hypotension, shortness of breath, dizziness and
weakness.

EXAM:
CHEST - 2 VIEW

[w chest lat]
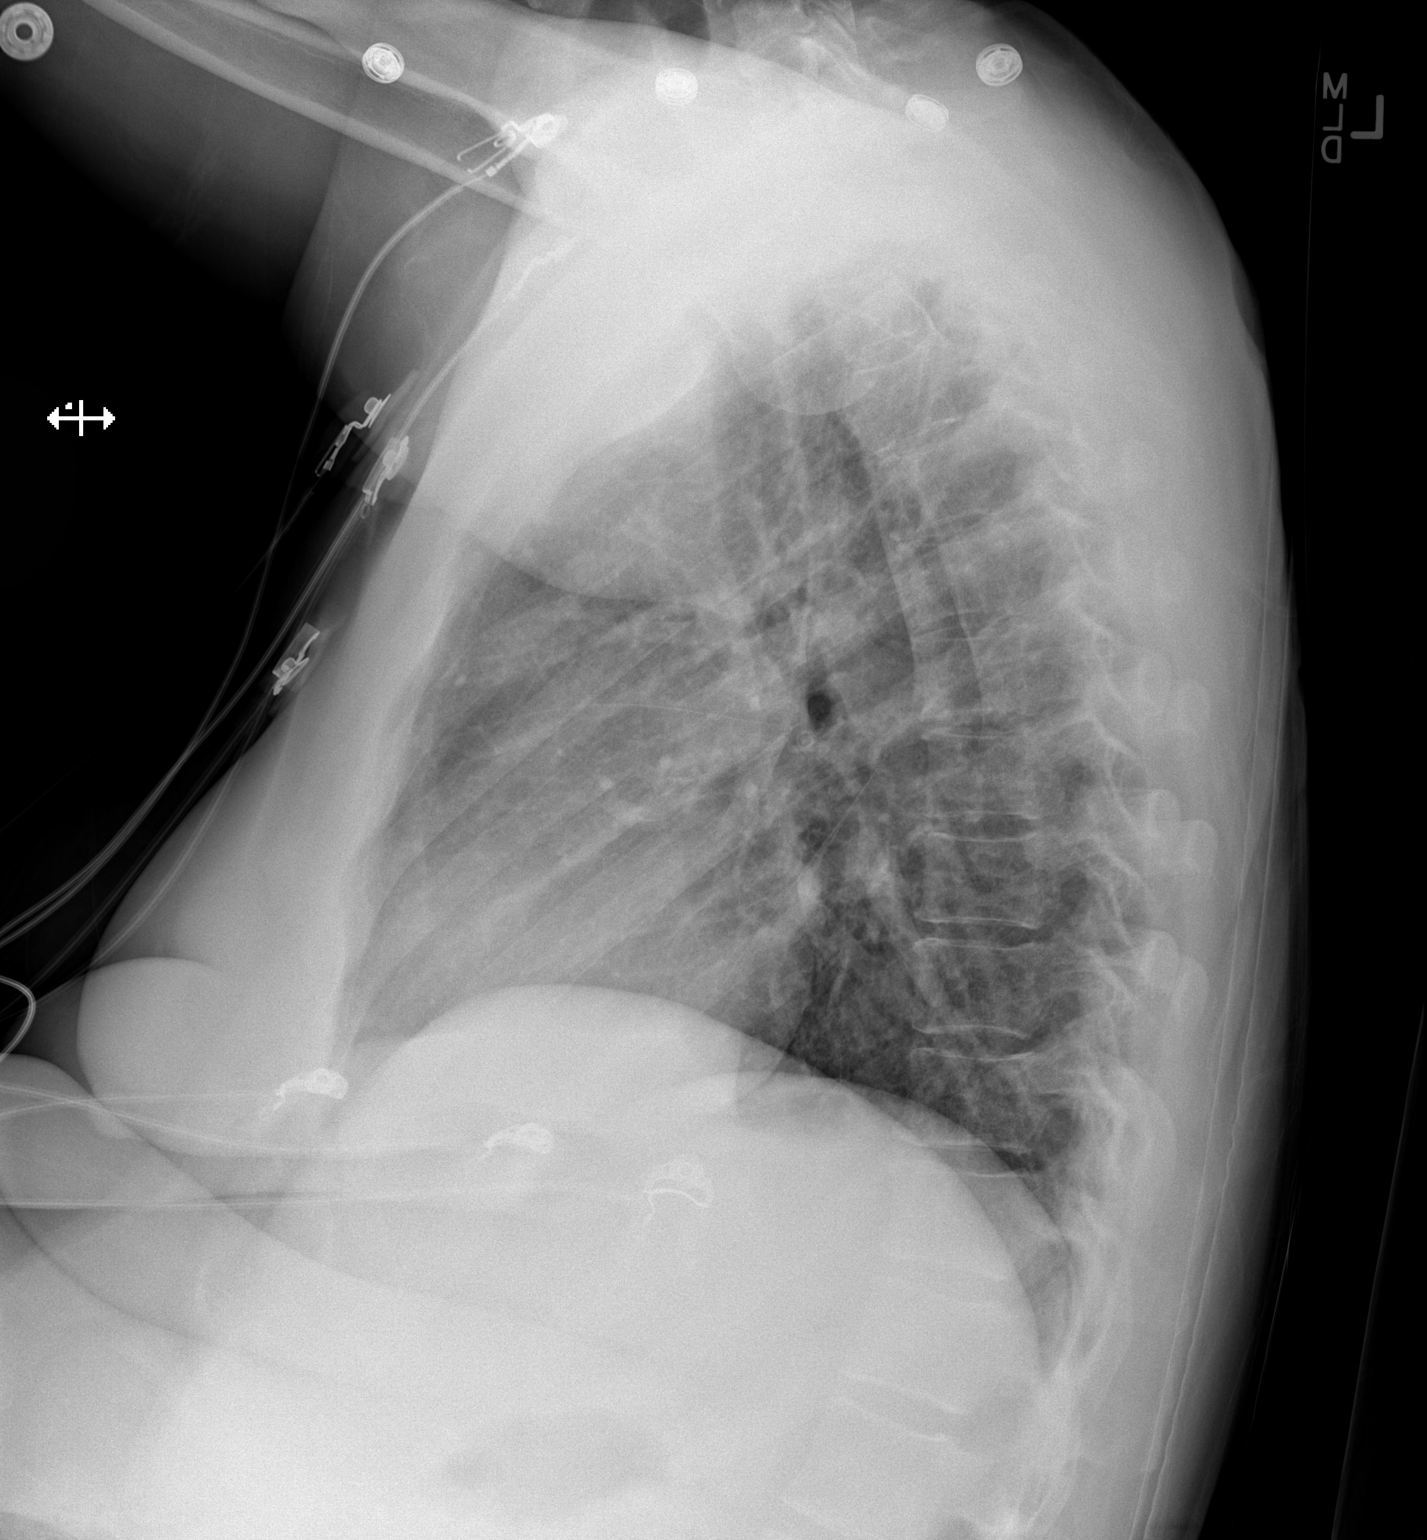

[w chest pa]
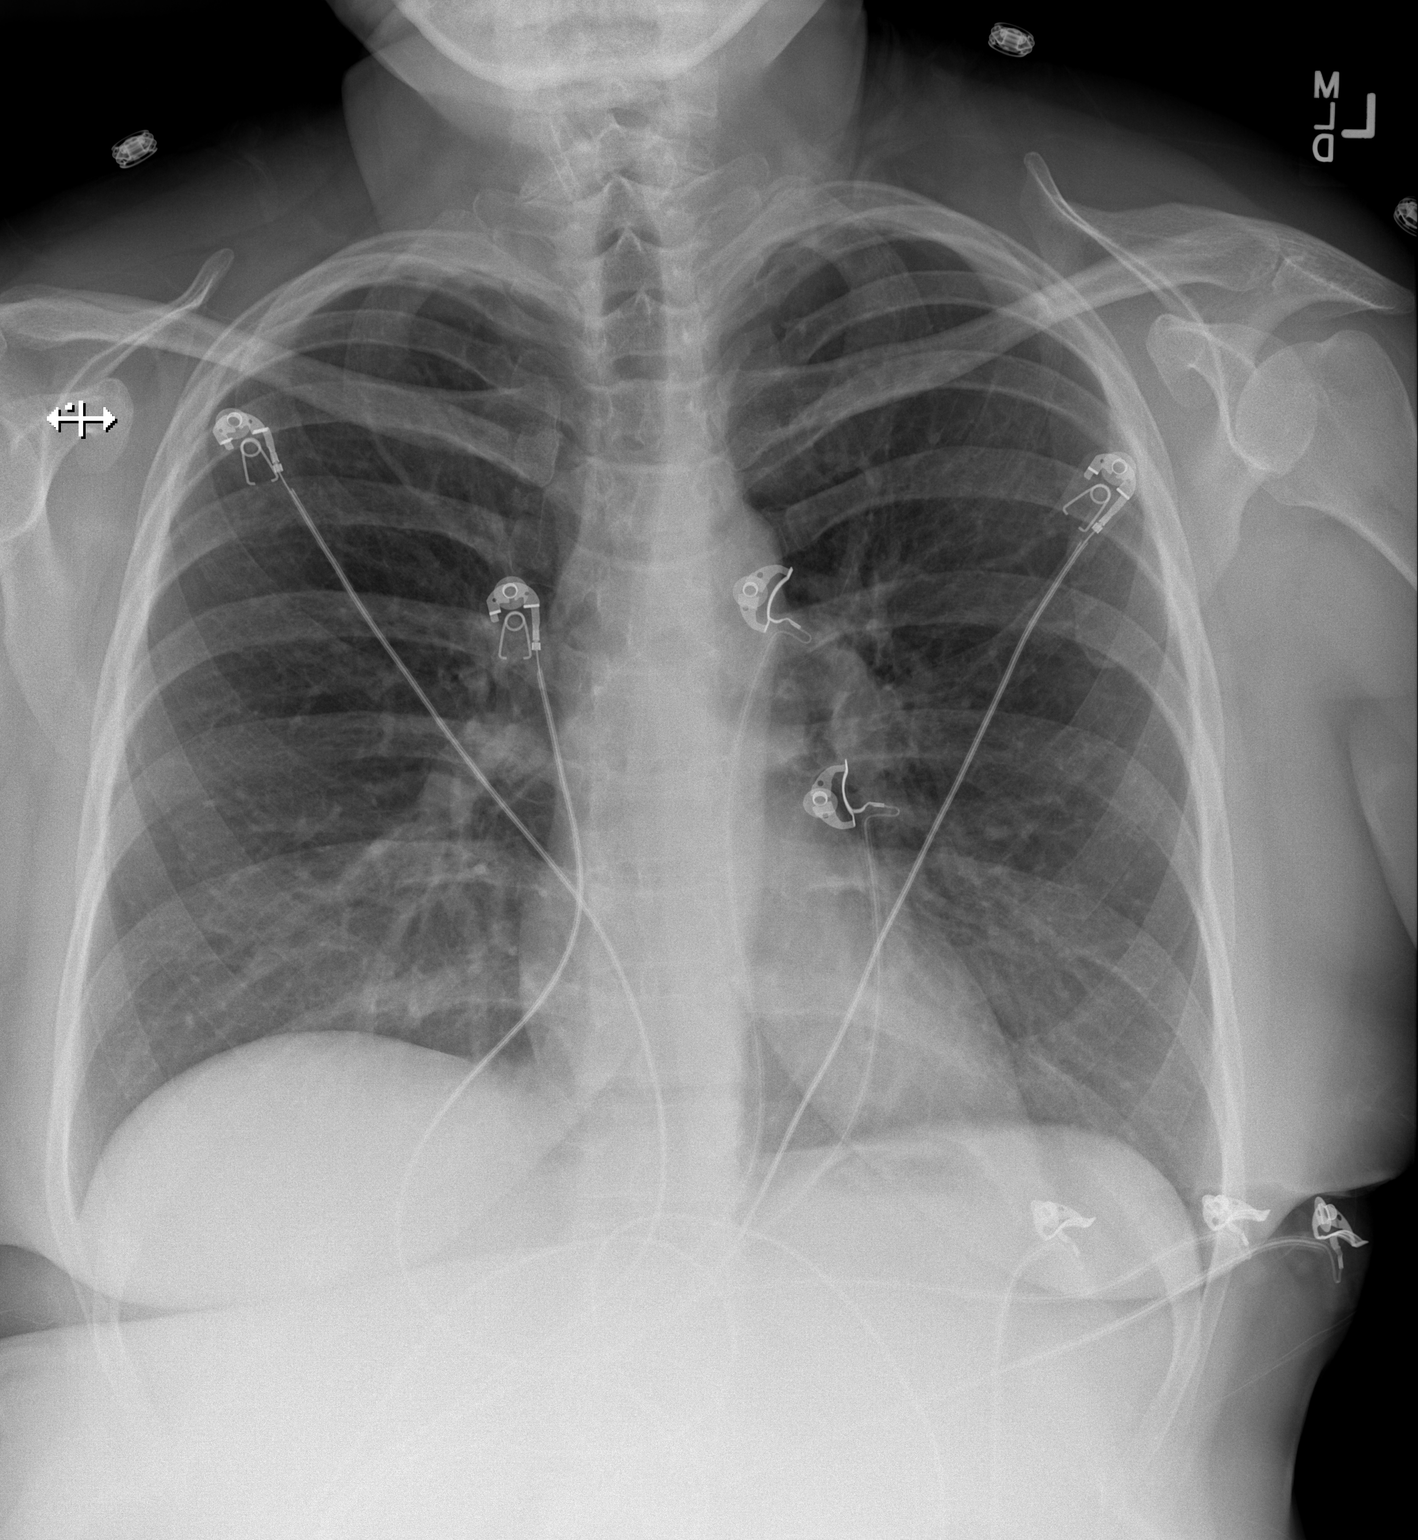

[2 of 2 positions shown; findings below may reference images not displayed]

FINDINGS: The heart size and mediastinal contours are within normal limits.
There is minimal atelectasis at the right lung base. There is no
evidence of pulmonary edema, consolidation, pneumothorax, nodule or
pleural fluid. The visualized skeletal structures are unremarkable.
IMPRESSION: No active disease.

## 2013-07-24 MED ORDER — SODIUM CHLORIDE 0.9 % IV BOLUS (SEPSIS)
1000.0000 mL | Freq: Once | INTRAVENOUS | Status: AC
  Administered 2013-07-24: 1000 mL via INTRAVENOUS

## 2013-07-24 MED ORDER — SODIUM CHLORIDE 0.9 % IV SOLN: INTRAVENOUS | Status: DC

## 2013-07-24 MED ORDER — DEXAMETHASONE SODIUM PHOSPHATE 10 MG/ML IJ SOLN
10.0000 mg | Freq: Once | INTRAMUSCULAR | Status: AC
  Administered 2013-07-24: 10 mg via INTRAMUSCULAR
  Filled 2013-07-24: qty 1

## 2013-07-24 NOTE — ED Provider Notes (Signed)
CSN: [id number]     Arrival date & time 07/24/13  1055 History   First MD Initiated Contact with Patient 07/24/13 1109     Chief Complaint  Patient presents with  . Near Syncope     (Consider location/radiation/quality/duration/timing/severity/associated sxs/prior Treatment) Patient is a 53 y.o. female presenting with near-syncope. The history is provided by the patient.  Near Syncope   patient here complaining of syncope x2 prior to arrival. She notes URI symptoms x3 days. No reported vomiting or diarrhea. No chest pain prior to her syncopal event. She did become dizzy and lightheaded. She was noted to be orthostatic per EMS. Does have recent sick exposure. Symptoms are persistent and worse with standing. Denies any rashes. No recent travel history. No treatment used prior to arrival. She does present via EMS.  Past Medical History  Diagnosis Date  . Hypertension   . Anxiety    History reviewed. No pertinent past surgical history. History reviewed. No pertinent family history. History  Substance Use Topics  . Smoking status: Never Smoker   . Smokeless tobacco: Not on file  . Alcohol Use: Yes     Comment: occsional   OB History   Grav Para Term Preterm Abortions TAB SAB Ect Mult Living                 Review of Systems  Cardiovascular: Positive for near-syncope.  All other systems reviewed and are negative.      Allergies  Review of patient's allergies indicates not on file.  Home Medications  No current outpatient prescriptions on file. BP 94/49  Pulse 104  Temp(Src) 98.8 F (37.1 C) (Oral)  Resp 22  SpO2 98% Physical Exam  Nursing note and vitals reviewed. Constitutional: She is oriented to person, place, and time. She appears well-developed and well-nourished.  Non-toxic appearance. No distress.  HENT:  Head: Normocephalic and atraumatic.  Eyes: Conjunctivae, EOM and lids are normal. Pupils are equal, round, and reactive to light.  Neck: Normal range of  motion. Neck supple. No rigidity. No tracheal deviation and normal range of motion present. No mass present.  Cardiovascular: Normal rate, regular rhythm and normal heart sounds.  Exam reveals no gallop.   No murmur heard. Pulmonary/Chest: Effort normal and breath sounds normal. No stridor. No respiratory distress. She has no decreased breath sounds. She has no wheezes. She has no rhonchi. She has no rales.  Abdominal: Soft. Normal appearance and bowel sounds are normal. She exhibits no distension. There is no tenderness. There is no rebound and no CVA tenderness.  Musculoskeletal: Normal range of motion. She exhibits no edema and no tenderness.  Neurological: She is alert and oriented to person, place, and time. She has normal strength. No cranial nerve deficit or sensory deficit. GCS eye subscore is 4. GCS verbal subscore is 5. GCS motor subscore is 6.  Skin: Skin is warm and dry. No abrasion and no rash noted.  Psychiatric: Her speech is normal and behavior is normal. Her affect is blunt.    ED Course  Procedures (including critical care time) Labs Review Labs Reviewed  CBC WITH DIFFERENTIAL  COMPREHENSIVE METABOLIC PANEL  URINALYSIS, ROUTINE W REFLEX MICROSCOPIC  TROPONIN I  I-STAT CG4 LACTIC ACID, ED   Imaging Review No results found.   EKG Interpretation None      MDM   Final diagnoses:  None    Patient given IV fluids for orthostatic vital signs. Feels much better after 2 L. Patient is concerned that  some  of her symptoms may be due to seasonal allergies. She has requested dose of IM steroids. Repeat orthostatics have improved. She is stable for discharge    Leota Jacobsen, MD 07/24/13 [phone]

## 2013-07-24 NOTE — ED Notes (Signed)
EMS reports pt has had cold symptoms for a few days, today orthostatic and dizzy, husband just returned from [place] with strep throat. IV started by EMS without any fluids given for bp.

## 2013-07-24 NOTE — Discharge Instructions (Signed)
Dehydration, Adult Dehydration is when you lose more fluids from the body than you take in. Vital organs like the kidneys, brain, and heart cannot function without a proper amount of fluids and salt. Any loss of fluids from the body can cause dehydration.  CAUSES   Vomiting.  Diarrhea.  Excessive sweating.  Excessive urine output.  Fever. SYMPTOMS  Mild dehydration  Thirst.  Dry lips.  Slightly dry mouth. Moderate dehydration  Very dry mouth.  Sunken eyes.  Skin does not bounce back quickly when lightly pinched and released.  Dark urine and decreased urine production.  Decreased tear production.  Headache. Severe dehydration  Very dry mouth.  Extreme thirst.  Rapid, weak pulse (more than 100 beats per minute at rest).  Cold hands and feet.  Not able to sweat in spite of heat and temperature.  Rapid breathing.  Blue lips.  Confusion and lethargy.  Difficulty being awakened.  Minimal urine production.  No tears. DIAGNOSIS  Your caregiver will diagnose dehydration based on your symptoms and your exam. Blood and urine tests will help confirm the diagnosis. The diagnostic evaluation should also identify the cause of dehydration. TREATMENT  Treatment of mild or moderate dehydration can often be done at home by increasing the amount of fluids that you drink. It is best to drink small amounts of fluid more often. Drinking too much at one time can make vomiting worse. Refer to the home care instructions below. Severe dehydration needs to be treated at the hospital where you will probably be given intravenous (IV) fluids that contain water and electrolytes. HOME CARE INSTRUCTIONS   Ask your caregiver about specific rehydration instructions.  Drink enough fluids to keep your urine clear or pale yellow.  Drink small amounts frequently if you have nausea and vomiting.  Eat as you normally do.  Avoid:  Foods or drinks high in sugar.  Carbonated  drinks.  Juice.  Extremely hot or cold fluids.  Drinks with caffeine.  Fatty, greasy foods.  Alcohol.  Tobacco.  Overeating.  Gelatin desserts.  Wash your hands well to avoid spreading bacteria and viruses.  Only take over-the-counter or prescription medicines for pain, discomfort, or fever as directed by your caregiver.  Ask your caregiver if you should continue all prescribed and over-the-counter medicines.  Keep all follow-up appointments with your caregiver. SEEK MEDICAL CARE IF:  You have abdominal pain and it increases or stays in one area (localizes).  You have a rash, stiff neck, or severe headache.  You are irritable, sleepy, or difficult to awaken.  You are weak, dizzy, or extremely thirsty. SEEK IMMEDIATE MEDICAL CARE IF:   You are unable to keep fluids down or you get worse despite treatment.  You have frequent episodes of vomiting or diarrhea.  You have blood or green matter (bile) in your vomit.  You have blood in your stool or your stool looks black and tarry.  You have not urinated in 6 to 8 hours, or you have only urinated a small amount of very dark urine.  You have a fever.  You faint. MAKE SURE YOU:   Understand these instructions.  Will watch your condition.  Will get help right away if you are not doing well or get worse. Document Released: 04/14/2005 Document Revised: 07/07/2011 Document Reviewed: 12/02/2010 [employer] Patient Information 2014 [employer].  

## 2013-11-21 ENCOUNTER — Emergency Department (HOSPITAL_COMMUNITY): Payer: Medicaid Other

## 2013-11-21 ENCOUNTER — Emergency Department (HOSPITAL_COMMUNITY)
Admission: EM | Admit: 2013-11-21 | Discharge: 2013-11-21 | Disposition: A | Discharge status: Home / Self Care | Payer: Medicaid Other | Attending: Emergency Medicine | Admitting: Emergency Medicine

## 2013-11-21 ENCOUNTER — Encounter (HOSPITAL_COMMUNITY): Payer: Self-pay | Admitting: Emergency Medicine

## 2013-11-21 ENCOUNTER — Other Ambulatory Visit: Payer: Self-pay

## 2013-11-21 DIAGNOSIS — R079 Chest pain, unspecified: Secondary | ICD-10-CM

## 2013-11-21 DIAGNOSIS — Z862 Personal history of diseases of the blood and blood-forming organs and certain disorders involving the immune mechanism: Secondary | ICD-10-CM | POA: Diagnosis not present

## 2013-11-21 DIAGNOSIS — Z79899 Other long term (current) drug therapy: Secondary | ICD-10-CM | POA: Diagnosis not present

## 2013-11-21 DIAGNOSIS — Z8639 Personal history of other endocrine, nutritional and metabolic disease: Secondary | ICD-10-CM | POA: Diagnosis not present

## 2013-11-21 DIAGNOSIS — Z7982 Long term (current) use of aspirin: Secondary | ICD-10-CM | POA: Insufficient documentation

## 2013-11-21 DIAGNOSIS — Z87891 Personal history of nicotine dependence: Secondary | ICD-10-CM | POA: Insufficient documentation

## 2013-11-21 DIAGNOSIS — I10 Essential (primary) hypertension: Secondary | ICD-10-CM | POA: Diagnosis not present

## 2013-11-21 DIAGNOSIS — Z880 Allergy status to penicillin: Secondary | ICD-10-CM | POA: Diagnosis not present

## 2013-11-21 DIAGNOSIS — F411 Generalized anxiety disorder: Secondary | ICD-10-CM | POA: Insufficient documentation

## 2013-11-21 HISTORY — DX: Hyperlipidemia, unspecified: E78.5

## 2013-11-21 LAB — TROPONIN I: Troponin I: <0.3 ng/mL (ref <0.30)

## 2013-11-21 LAB — BASIC METABOLIC PANEL
ANION GAP: 15 (ref 5–15)
BUN: 12 mg/dL (ref 6–23)
CO2: 24 mEq/L (ref 19–32)
Calcium: 9.2 mg/dL (ref 8.4–10.5)
Chloride: 98 mEq/L (ref 96–112)
Creatinine, Ser: 0.75 mg/dL (ref 0.50–1.10)
Glucose, Bld: 83 mg/dL (ref 70–99)
POTASSIUM: 3.8 meq/L (ref 3.7–5.3)
SODIUM: 137 meq/L (ref 137–147)

## 2013-11-21 LAB — CBC
HCT: 38.2 % (ref 36.0–46.0)
Hemoglobin: 12.6 g/dL (ref 12.0–15.0)
MCH: 30.5 pg (ref 26.0–34.0)
MCHC: 33 g/dL (ref 30.0–36.0)
MCV: 92.5 fL (ref 78.0–100.0)
PLATELETS: 237 10*3/uL (ref 150–400)
RBC: 4.13 MIL/uL (ref 3.87–5.11)
RDW: 13.7 % (ref 11.5–15.5)
WBC: 8.1 10*3/uL (ref 4.0–10.5)

## 2013-11-21 LAB — PRO B NATRIURETIC PEPTIDE: Pro B Natriuretic peptide (BNP): 61.6 pg/mL (ref 0–125)

## 2013-11-21 LAB — I-STAT TROPONIN, ED: TROPONIN I, POC: 0 ng/mL (ref 0.00–0.08)

## 2013-11-21 IMAGING — CR DG CHEST 2V
2 series · 2 of 2 positions shown · non-contrast
Comparison: [DATE]

CLINICAL DATA: Intermittent chest pain for 1 week.

EXAM:
CHEST  2 VIEW

[w chest pa]
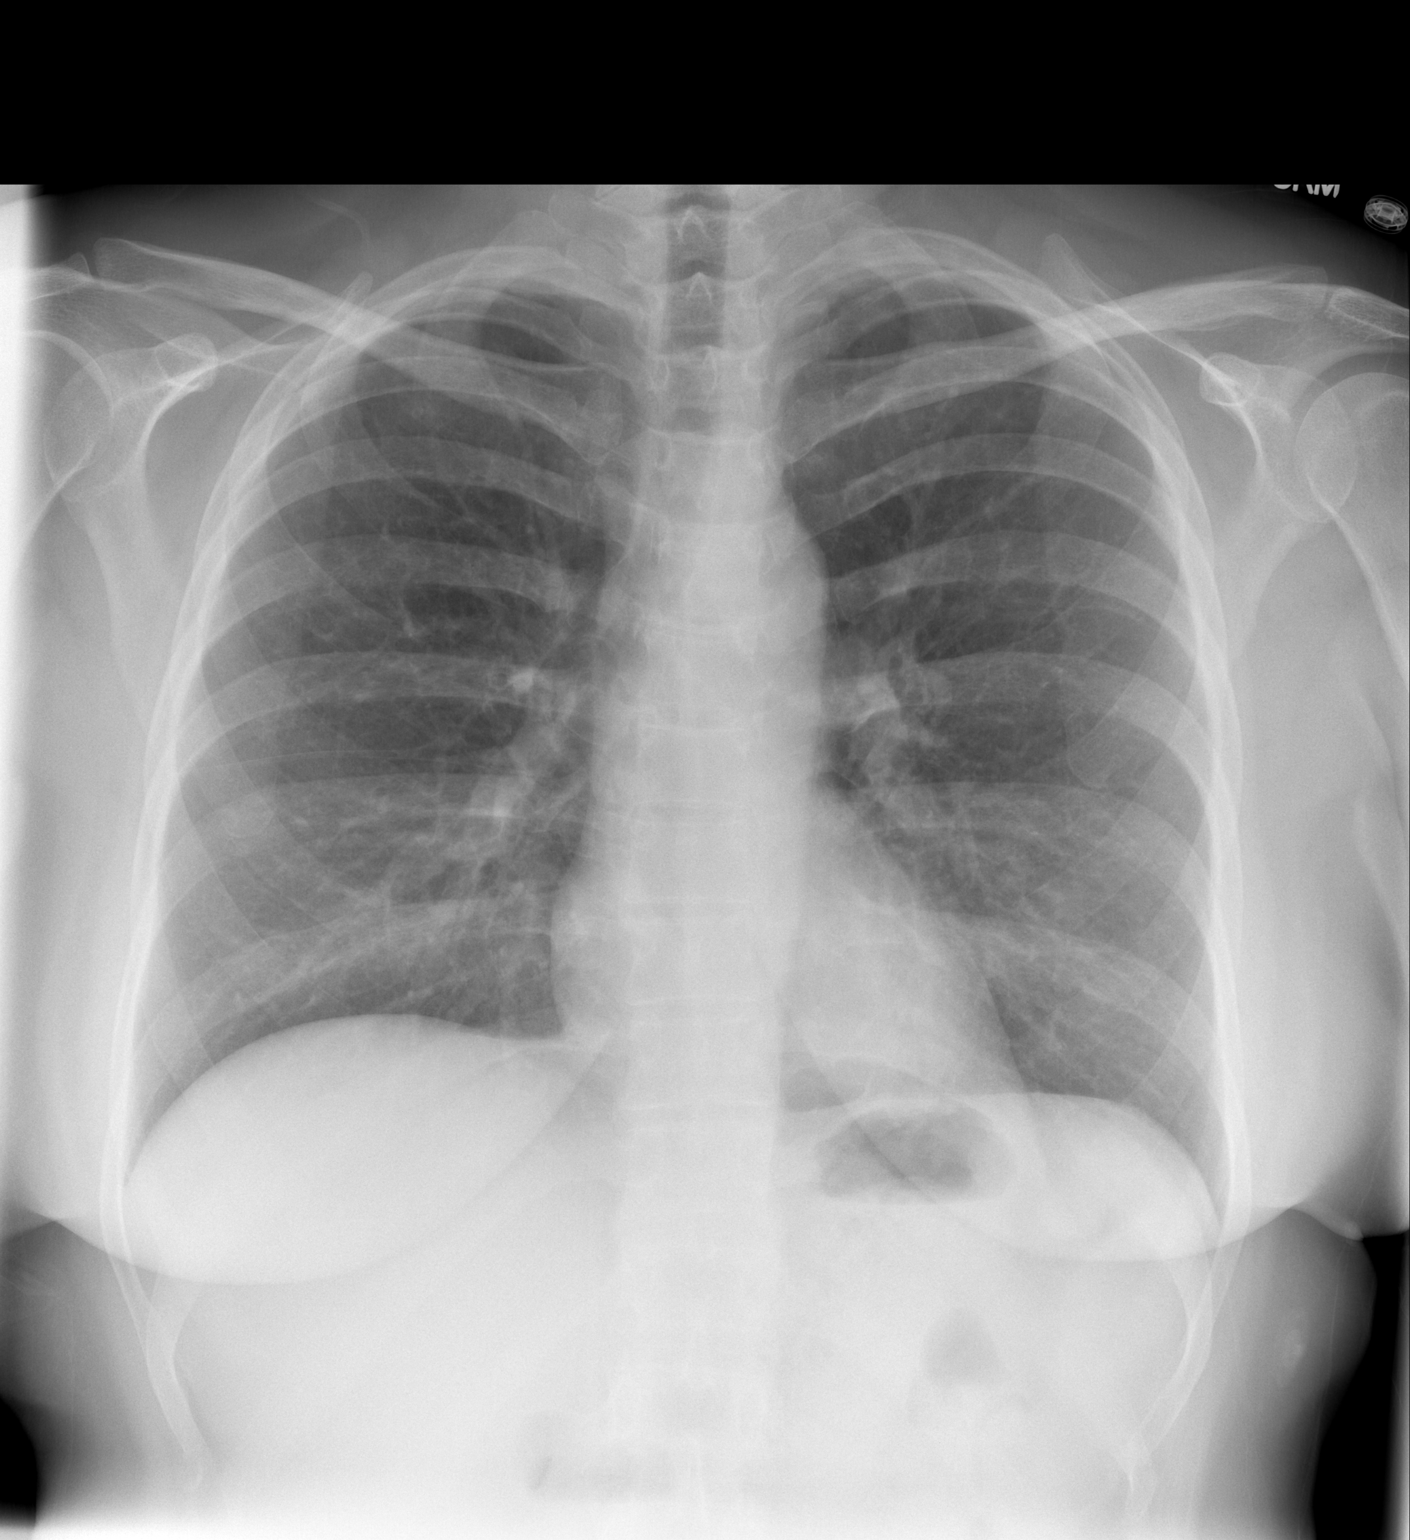

[w chest lat]
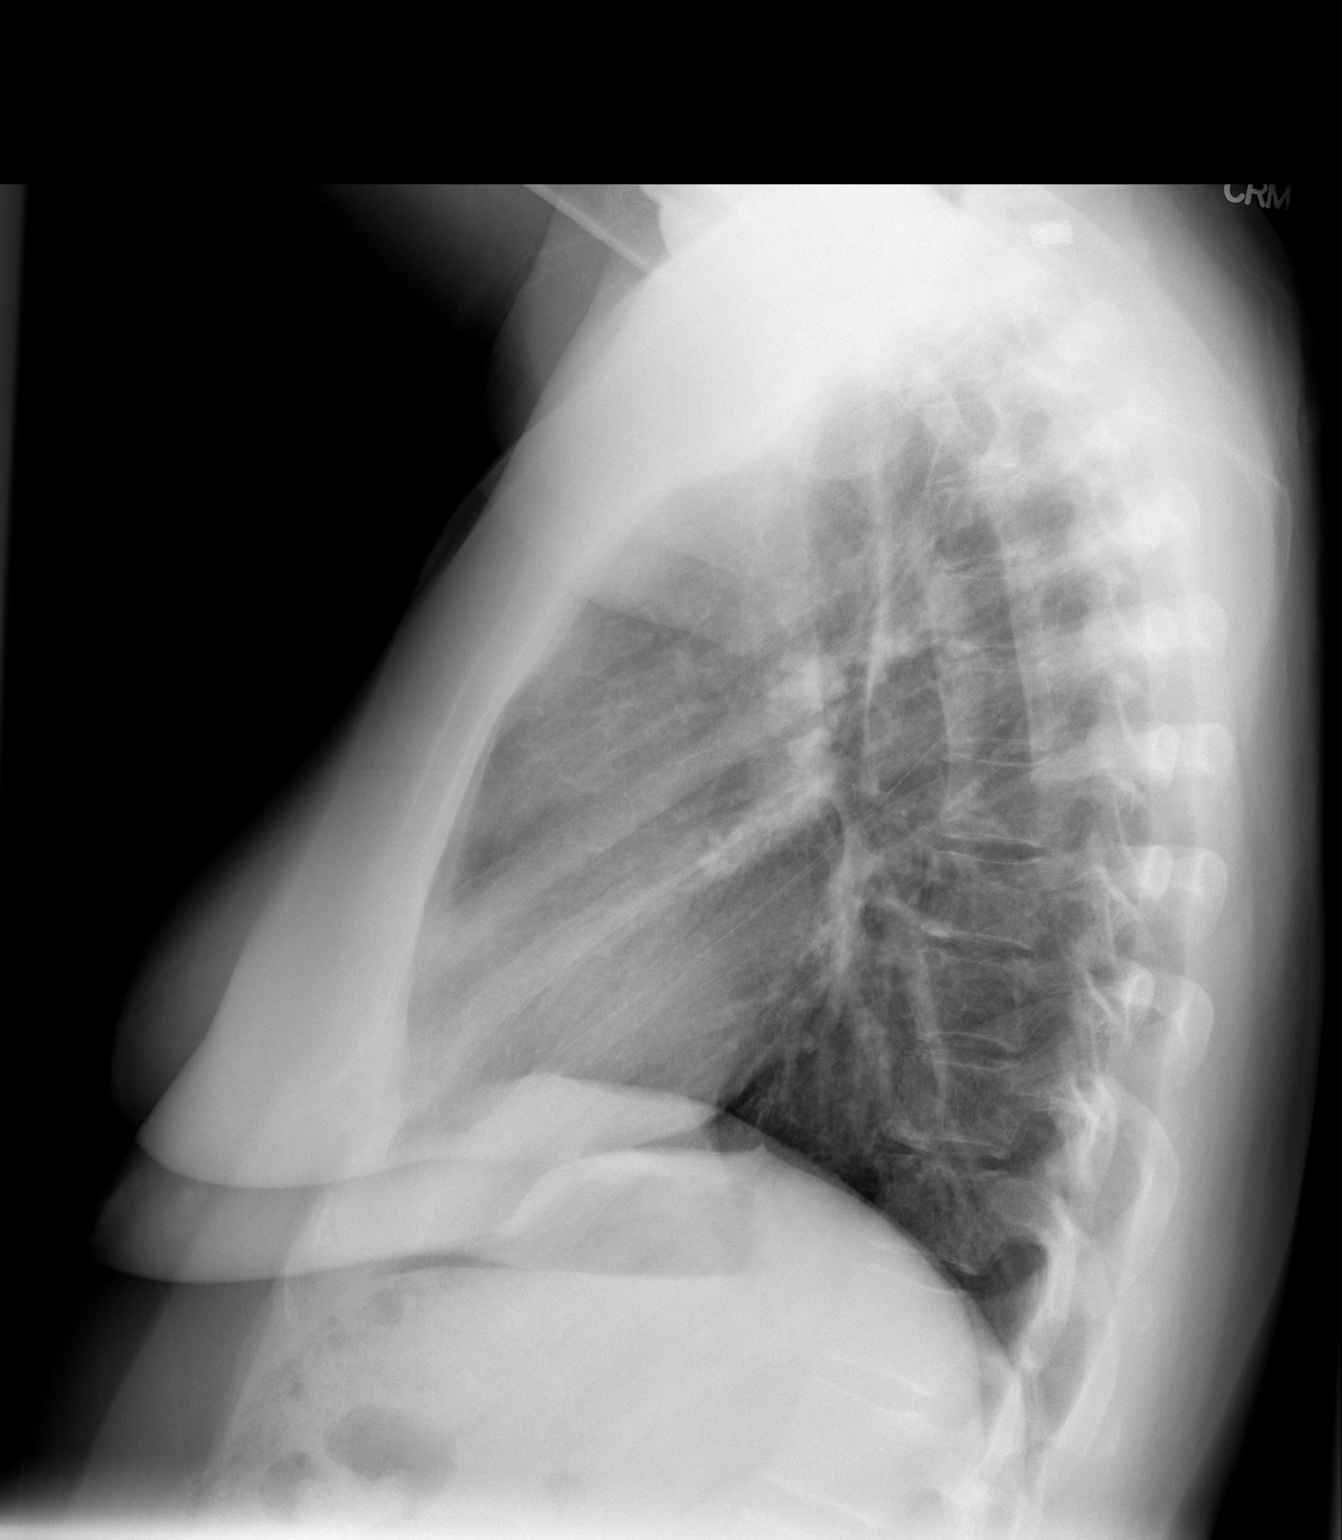

[2 of 2 positions shown; findings below may reference images not displayed]

FINDINGS: The heart size and mediastinal contours are within normal limits.
Both lungs are clear. The visualized skeletal structures are
unremarkable.
IMPRESSION: Normal chest.

## 2013-11-21 NOTE — ED Provider Notes (Signed)
CSN: [id number]     Arrival date & time 11/21/13  1518 History   First MD Initiated Contact with Patient 11/21/13 1728     Chief Complaint  Patient presents with  . Chest Pain    The patient has been having chest pain for a week.  She went to her PCP two weeks ago and was told she was having "irregular heartbeat".  They gave her aspirin and referred her to a cardiologist.     (Consider location/radiation/quality/duration/timing/severity/associated sxs/prior Treatment) HPI Patient presents with concerns of chest pain. Has been present for greater than one week. The pain is intermittent, currently not present. There is associated sensation of palpitations, but no dyspnea, no pleuritic or exertional change. Pain is left-sided, sore, moderate. No clear precipitating, alleviating, exacerbating factors. When the pain is present, there is no lightheadedness, syncope, nausea, vomiting, confusion, disorientation.  Past Medical History  Diagnosis Date  . Hypertension   . Anxiety   . Hyperlipidemia    Past Surgical History  Procedure Laterality Date  . Abdominal surgery    . Abdominal hysterectomy     History reviewed. No pertinent family history. History  Substance Use Topics  . Smoking status: Former Research scientist (life sciences)  . Smokeless tobacco: Never Used  . Alcohol Use: Yes     Comment: occsional   OB History   Grav Para Term Preterm Abortions TAB SAB Ect Mult Living                 Review of Systems  Constitutional:       Per HPI, otherwise negative  HENT:       Per HPI, otherwise negative  Respiratory:       Per HPI, otherwise negative  Cardiovascular:       Per HPI, otherwise negative  Gastrointestinal: Negative for vomiting.  Endocrine:       Negative aside from HPI  Genitourinary:       Neg aside from HPI   Musculoskeletal:       Per HPI, otherwise negative  Skin: Negative.   Neurological: Negative for syncope.      Allergies  Amoxicillin  Home Medications   Prior  to Admission medications   Medication Sig Start Date End Date Taking? Authorizing Provider  aspirin EC 81 MG tablet Take 324 mg by mouth once.   Yes Historical Provider, MD  benazepril (LOTENSIN) 20 MG tablet Take 20 mg by mouth daily.   Yes Historical Provider, MD  clonazePAM (KLONOPIN) 1 MG tablet Take 1 mg by mouth 2 (two) times daily.   Yes Historical Provider, MD  hydrochlorothiazide (HYDRODIURIL) 25 MG tablet Take 25 mg by mouth daily.   Yes Historical Provider, MD   BP 123/66  Pulse 77  Temp(Src) 98.3 F (36.8 C) (Oral)  Resp 18  Ht 5\' 3"  (1.6 m)  Wt 195 lb (88.451 kg)  BMI 34.55 kg/m2  SpO2 98% Physical Exam  Nursing note and vitals reviewed. Constitutional: She is oriented to person, place, and time. She appears well-developed and well-nourished. No distress.  HENT:  Head: Normocephalic and atraumatic.  Eyes: Conjunctivae and EOM are normal.  Cardiovascular: Normal rate and regular rhythm.   Pulmonary/Chest: Effort normal and breath sounds normal. No stridor. No respiratory distress.  Abdominal: She exhibits no distension.  Musculoskeletal: She exhibits no edema.  Neurological: She is alert and oriented to person, place, and time. No cranial nerve deficit.  Skin: Skin is warm and dry.  Psychiatric: She has a normal mood  and affect.    ED Course  Procedures (including critical care time) Labs Review Labs Reviewed  [place], ED    Imaging Review No results found.   EKG Interpretation   Date/Time:  Monday November 21 2013 15:29:27 EDT Ventricular Rate:  80 PR Interval:  150 QRS Duration: 84 QT Interval:  376 QTC Calculation: 433 R Axis:   62 Text Interpretation:  Normal sinus rhythm Normal ECG Sinus rhythm Normal  ECG Confirmed by Carmin Muskrat  MD ([id number]) on 11/21/2013 5:30:03 PM     On repeat exam the patient is in no distress. Second troponin is unremarkable.  She will followup with primary care. MDM   Final  diagnoses:  Chest pain at rest    Patient presents with chest pain palpitations, is asymptomatic here.  Patient has minimal risk profile, is awake and alert, with no notable changes on monitor or in vital signs emergency course the patient has previously been advised to call cardiology for dizziness monitoring, and after reassuring evaluation here was discharged in similar condition.     Carmin Muskrat, MD 11/21/13 2317

## 2013-11-21 NOTE — Discharge Instructions (Signed)
As discussed, your evaluation today has been largely reassuring.  But, it is important that you monitor your condition carefully, and do not hesitate to return to the ED if you develop new, or concerning changes in your condition.  Otherwise, please follow-up with our cardiologists for appropriate ongoing care.   Chest Pain (Nonspecific) It is often hard to give a specific diagnosis for the cause of chest pain. There is always a chance that your pain could be related to something serious, such as a heart attack or a blood clot in the lungs. You need to follow up with your health care provider for further evaluation. CAUSES   Heartburn.  Pneumonia or bronchitis.  Anxiety or stress.  Inflammation around your heart (pericarditis) or lung (pleuritis or pleurisy).  A blood clot in the lung.  A collapsed lung (pneumothorax). It can develop suddenly on its own (spontaneous pneumothorax) or from trauma to the chest.  Shingles infection (herpes zoster virus). The chest wall is composed of bones, muscles, and cartilage. Any of these can be the source of the pain.  The bones can be bruised by injury.  The muscles or cartilage can be strained by coughing or overwork.  The cartilage can be affected by inflammation and become sore (costochondritis). DIAGNOSIS  Lab tests or other studies may be needed to find the cause of your pain. Your health care provider may have you take a test called an ambulatory electrocardiogram (ECG). An ECG records your heartbeat patterns over a 24-hour period. You may also have other tests, such as:  Transthoracic echocardiogram (TTE). During echocardiography, sound waves are used to evaluate how blood flows through your heart.  Transesophageal echocardiogram (TEE).  Cardiac monitoring. This allows your health care provider to monitor your heart rate and rhythm in real time.  Holter monitor. This is a portable device that records your heartbeat and can help  diagnose heart arrhythmias. It allows your health care provider to track your heart activity for several days, if needed.  Stress tests by exercise or by giving medicine that makes the heart beat faster. TREATMENT   Treatment depends on what may be causing your chest pain. Treatment may include:  Acid blockers for heartburn.  Anti-inflammatory medicine.  Pain medicine for inflammatory conditions.  Antibiotics if an infection is present.  You may be advised to change lifestyle habits. This includes stopping smoking and avoiding alcohol, caffeine, and chocolate.  You may be advised to keep your head raised (elevated) when sleeping. This reduces the chance of acid going backward from your stomach into your esophagus. Most of the time, nonspecific chest pain will improve within 2-3 days with rest and mild pain medicine.  HOME CARE INSTRUCTIONS   If antibiotics were prescribed, take them as directed. Finish them even if you start to feel better.  For the next few days, avoid physical activities that bring on chest pain. Continue physical activities as directed.  Do not use any tobacco products, including cigarettes, chewing tobacco, or electronic cigarettes.  Avoid drinking alcohol.  Only take medicine as directed by your health care provider.  Follow your health care provider's suggestions for further testing if your chest pain does not go away.  Keep any follow-up appointments you made. If you do not go to an appointment, you could develop lasting (chronic) problems with pain. If there is any problem keeping an appointment, call to reschedule. SEEK MEDICAL CARE IF:   Your chest pain does not go away, even after treatment.  You  have a rash with blisters on your chest.  You have a fever. SEEK IMMEDIATE MEDICAL CARE IF:   You have increased chest pain or pain that spreads to your arm, neck, jaw, back, or abdomen.  You have shortness of breath.  You have an increasing cough,  or you cough up blood.  You have severe back or abdominal pain.  You feel nauseous or vomit.  You have severe weakness.  You faint.  You have chills. This is an emergency. Do not wait to see if the pain will go away. Get medical help at once. Call your local emergency services (911 in [place] Do not drive yourself to the hospital. MAKE SURE YOU:   Understand these instructions.  Will watch your condition.  Will get help right away if you are not doing well or get worse. Document Released: 01/22/2005 Document Revised: 04/19/2013 Document Reviewed: 11/18/2007 [facility] Patient Information 2015 [place], [place]. This information is not intended to replace advice given to you by your health care provider. Make sure you discuss any questions you have with your health care provider.

## 2013-11-21 NOTE — ED Notes (Signed)
The patient has been having chest pain for a week.  She went to her PCP two weeks ago and was told she was having "irregular heartbeat".  They gave her aspirin and referred her to a cardiologist.  The patient says her chest pain is intermittent.  Today she began to feel the pain so she decided to come to the ED to be evaluated.  She denies any other symptoms except for trouble sleeping.

## 2013-12-08 ENCOUNTER — Encounter: Payer: Self-pay | Admitting: Cardiovascular Disease

## 2013-12-08 ENCOUNTER — Ambulatory Visit (INDEPENDENT_AMBULATORY_CARE_PROVIDER_SITE_OTHER): Payer: Medicaid Other | Admitting: Cardiovascular Disease

## 2013-12-08 VITALS — BP 148/70 | HR 76 | Ht 63.0 in | Wt 198.0 lb

## 2013-12-08 DIAGNOSIS — R079 Chest pain, unspecified: Secondary | ICD-10-CM

## 2013-12-08 DIAGNOSIS — R011 Cardiac murmur, unspecified: Secondary | ICD-10-CM

## 2013-12-08 HISTORY — DX: Cardiac murmur, unspecified: R01.1

## 2013-12-08 HISTORY — DX: Chest pain, unspecified: R07.9

## 2013-12-08 NOTE — Progress Notes (Signed)
Patient ID: Christina Burke, female   DOB: 1960-05-19, 53 y.o.   MRN: [id number]    53 yo seen in ER last week with atypical chest pain.  R/O normal ECG and told to f/u with cardiology  She has been under some stress with relationships  Single parent to two teenagers.  Divorced 3 years ago.  Drinks on occasion not daily.  Non smoker.  Had positional left and right sided pain No pleurisy.  No associated PND, orhtopnea or syncope Gets palpitations at times but nothing sustained. Long standing history of murmur Indicates murmur with [place] 20 years ago during her first pregnancy but no f/u.  No history of SBE or congenital disease  Chest pain not returned since ER d/c    ROS: Denies fever, malais, weight loss, blurry vision, decreased visual acuity, cough, sputum, SOB, hemoptysis, pleuritic pain, palpitaitons, heartburn, abdominal pain, melena, lower extremity edema, claudication, or rash.  All other systems reviewed and negative   General: Affect appropriate Healthy:  appears stated age 68: normal Neck supple with no adenopathy JVP normal no bruits no thyromegaly Lungs clear with no wheezing and good diaphragmatic motion Heart:  S1/S2 2/6 SEM  murmur,rub, gallop or click PMI normal Abdomen: benighn, BS positve, no tenderness, no AAA no bruit.  No HSM or HJR Distal pulses intact with no bruits No edema Neuro non-focal Skin warm and dry No muscular weakness  Medications Current Outpatient Prescriptions  Medication Sig Dispense Refill  . benazepril (LOTENSIN) 20 MG tablet Take 20 mg by mouth daily.      . clonazePAM (KLONOPIN) 1 MG tablet Take 1 mg by mouth 2 (two) times daily.      . hydrochlorothiazide (HYDRODIURIL) 25 MG tablet Take 25 mg by mouth daily.       No current facility-administered medications for this visit.    Allergies Amoxicillin  Family History: No family history on file.  Social History: History   Social History  . Marital Status: Legally Separated   Spouse Name: N/A    Number of Children: N/A  . Years of Education: N/A   Occupational History  . Not on file.   Social History Main Topics  . Smoking status: Former Research scientist (life sciences)  . Smokeless tobacco: Never Used  . Alcohol Use: Yes     Comment: occsional  . Drug Use: No  . Sexual Activity: Yes    Birth Control/ Protection: None   Other Topics Concern  . Not on file   Social History Narrative  . No narrative on file    Electrocardiogram:  NSR normal ECG   Assessment and Plan

## 2013-12-08 NOTE — Assessment & Plan Note (Signed)
Significant aortic outflow tract murmur  F/U echo

## 2013-12-08 NOTE — Patient Instructions (Signed)
Your physician recommends that you continue on your current medications as directed. Please refer to the Current Medication list given to you today.  Your physician has requested that you have an echocardiogram. Echocardiography is a painless test that uses sound waves to create images of your heart. It provides your doctor with information about the size and shape of your heart and how well your heart's chambers and valves are working. This procedure takes approximately one hour. There are no restrictions for this procedure.   Your physician has requested that you have an exercise tolerance test. For further information please visit [email or web address]. Please also follow instruction sheet, as given.  Your physician wants you to follow-up in: 1 year. You will receive a reminder letter in the mail two months in advance. If you don't receive a letter, please call our office to schedule the follow-up appointment.

## 2013-12-08 NOTE — Assessment & Plan Note (Signed)
Atypical requiring ER visit Normal ECG  F/U ETT

## 2014-01-18 ENCOUNTER — Ambulatory Visit (HOSPITAL_COMMUNITY)
Admission: RE | Admit: 2014-01-18 | Discharge: 2014-01-18 | Disposition: A | Discharge status: Home / Self Care | Payer: Medicaid Other | Source: Ambulatory Visit | Attending: Cardiology | Admitting: Cardiology

## 2014-01-18 ENCOUNTER — Ambulatory Visit (HOSPITAL_BASED_OUTPATIENT_CLINIC_OR_DEPARTMENT_OTHER)
Admission: RE | Admit: 2014-01-18 | Discharge: 2014-01-18 | Disposition: A | Discharge status: Home / Self Care | Payer: Medicaid Other | Source: Ambulatory Visit | Attending: Cardiology | Admitting: Cardiology

## 2014-01-18 ENCOUNTER — Other Ambulatory Visit (HOSPITAL_COMMUNITY): Payer: Medicaid Other

## 2014-01-18 DIAGNOSIS — R011 Cardiac murmur, unspecified: Secondary | ICD-10-CM | POA: Insufficient documentation

## 2014-01-18 DIAGNOSIS — R079 Chest pain, unspecified: Secondary | ICD-10-CM | POA: Insufficient documentation

## 2014-01-18 DIAGNOSIS — Z87891 Personal history of nicotine dependence: Secondary | ICD-10-CM | POA: Insufficient documentation

## 2014-01-18 DIAGNOSIS — I369 Nonrheumatic tricuspid valve disorder, unspecified: Secondary | ICD-10-CM

## 2014-01-18 DIAGNOSIS — Z8249 Family history of ischemic heart disease and other diseases of the circulatory system: Secondary | ICD-10-CM | POA: Insufficient documentation

## 2014-01-18 NOTE — Progress Notes (Signed)
2D Echocardiogram Complete.  01/18/2014   Carmon Sahli, RDCS

## 2014-01-18 NOTE — Procedures (Signed)
Exercise Treadmill Test  Pre-Exercise Testing Evaluation  NSR, LVH by voltage criteria only  Test  Exercise Tolerance Test Ordering MD: Jenkins Rouge, MD    Unique Test No: 1  Treadmill:  1  Indication for ETT: chest pain - rule out ischemia  Contraindication to ETT: No   Stress Modality: exercise - treadmill  Cardiac Imaging Performed: non   Protocol: standard Bruce - maximal  Max BP:  198/71  Max MPHR (bpm):  167 85% MPR (bpm):  141  MPHR obtained (bpm):  153 % MPHR obtained:  91  Reached 85% MPHR (min:sec):  3:30 Total Exercise Time (min-sec):  5  Workload in METS:  7.0 Borg Scale: 15  Reason ETT Terminated:  ST depression > 2.5 mm Pt. ok'd to be D/C home per Dr. Sallyanne Kuster   ST Segment Analysis At Rest: normal ST segments - no evidence of significant ST depression With Exercise: significant ischemic ST depression; > 31mm horizontal ST depression is seen in the inferior leads and > 1 mm horizontal depression is seen in V4-V6  Other Information Arrhythmia:  No Angina during ETT:  absent (0) Quality of ETT:  diagnostic  ETT Interpretation:  abnormal - evidence of ST depression consistent with ischemia  Comments: Good exercise tolerance. Normotensive response to exercise. Duke score -14  Recommendations: Further evaluation for coronary disease is probably indicated.  Sanda Klein, MD, [facility] [phone] office [phone] pager

## 2014-01-25 ENCOUNTER — Ambulatory Visit (INDEPENDENT_AMBULATORY_CARE_PROVIDER_SITE_OTHER): Payer: Medicaid Other | Admitting: Cardiovascular Disease

## 2014-01-25 ENCOUNTER — Encounter: Payer: Self-pay | Admitting: Cardiovascular Disease

## 2014-01-25 VITALS — BP 122/78 | HR 78 | Ht 63.5 in | Wt 204.0 lb

## 2014-01-25 DIAGNOSIS — R079 Chest pain, unspecified: Secondary | ICD-10-CM

## 2014-01-25 DIAGNOSIS — R9430 Abnormal result of cardiovascular function study, unspecified: Secondary | ICD-10-CM

## 2014-01-25 DIAGNOSIS — I10 Essential (primary) hypertension: Secondary | ICD-10-CM

## 2014-01-25 MED ORDER — NITROGLYCERIN 0.4 MG SL SUBL: 0.4000 mg | SUBLINGUAL_TABLET | SUBLINGUAL | Status: DC | PRN

## 2014-01-25 NOTE — Patient Instructions (Addendum)
Your physician has recommended you make the following change in your medication:  1) an Rx has been sent to your pharmacy for Nitro-glycerin use as directed.  Your physician has requested that you have cardiac CT. Cardiac computed tomography (CT) is a painless test that uses an x-ray machine to take clear, detailed pictures of your heart. For further information please visit [email or web address]. Please follow instruction sheet as given.   Your physician recommends that you schedule a follow-up appointment with Dr.Nishan his  next available day in the office   Nitroglycerin sublingual tablets What is this medicine? NITROGLYCERIN (nye troe GLI ser in) is a type of vasodilator. It relaxes blood vessels, increasing the blood and oxygen supply to your heart. This medicine is used to relieve chest pain caused by angina. It is also used to prevent chest pain before activities like climbing stairs, going outdoors in cold weather, or sexual activity. This medicine may be used for other purposes; ask your health care provider or pharmacist if you have questions. COMMON BRAND NAME(S): Nitroquick, Nitrostat, Nitrotab What should I tell my health care provider before I take this medicine? They need to know if you have any of these conditions: -anemia -head injury, recent stroke, or bleeding in the brain -liver disease -previous heart attack -an unusual or allergic reaction to nitroglycerin, other medicines, foods, dyes, or preservatives -pregnant or trying to get pregnant -breast-feeding How should I use this medicine? Take this medicine by mouth as needed. At the first sign of an angina attack (chest pain or tightness) place one tablet under your tongue. You can also take this medicine 5 to 10 minutes before an event likely to produce chest pain. Follow the directions on the prescription label. Let the tablet dissolve under the tongue. Do not swallow whole. Replace the dose if you accidentally swallow  it. It will help if your mouth is not dry. Saliva around the tablet will help it to dissolve more quickly. Do not eat or drink, smoke or chew tobacco while a tablet is dissolving. If you are not better within 5 minutes after taking ONE dose of nitroglycerin, call 9-1-1 immediately to seek emergency medical care. Do not take more than 3 nitroglycerin tablets over 15 minutes. If you take this medicine often to relieve symptoms of angina, your doctor or health care professional may provide you with different instructions to manage your symptoms. If symptoms do not go away after following these instructions, it is important to call 9-1-1 immediately. Do not take more than 3 nitroglycerin tablets over 15 minutes. Talk to your pediatrician regarding the use of this medicine in children. Special care may be needed. Overdosage: If you think you have taken too much of this medicine contact a poison control center or emergency room at once. NOTE: This medicine is only for you. Do not share this medicine with others. What if I miss a dose? This does not apply. This medicine is only used as needed. What may interact with this medicine? Do not take this medicine with any of the following medications: -certain migraine medicines like ergotamine and dihydroergotamine (DHE) -medicines used to treat erectile dysfunction like sildenafil, tadalafil, and vardenafil -riociguat This medicine may also interact with the following medications: -alteplase -aspirin -heparin -medicines for high blood pressure -medicines for mental depression -other medicines used to treat angina -phenothiazines like chlorpromazine, mesoridazine, prochlorperazine, thioridazine This list may not describe all possible interactions. Give your health care provider a list of all the medicines, herbs, non-prescription drugs,  or dietary supplements you use. Also tell them if you smoke, drink alcohol, or use illegal drugs. Some items may interact  with your medicine. What should I watch for while using this medicine? Tell your doctor or health care professional if you feel your medicine is no longer working. Keep this medicine with you at all times. Sit or lie down when you take your medicine to prevent falling if you feel dizzy or faint after using it. Try to remain calm. This will help you to feel better faster. If you feel dizzy, take several deep breaths and lie down with your feet propped up, or bend forward with your head resting between your knees. You may get drowsy or dizzy. Do not drive, use machinery, or do anything that needs mental alertness until you know how this drug affects you. Do not stand or sit up quickly, especially if you are an older patient. This reduces the risk of dizzy or fainting spells. Alcohol can make you more drowsy and dizzy. Avoid alcoholic drinks. Do not treat yourself for coughs, colds, or pain while you are taking this medicine without asking your doctor or health care professional for advice. Some ingredients may increase your blood pressure. What side effects may I notice from receiving this medicine? Side effects that you should report to your doctor or health care professional as soon as possible: -blurred vision -dry mouth -skin rash -sweating -the feeling of extreme pressure in the head -unusually weak or tired Side effects that usually do not require medical attention (report to your doctor or health care professional if they continue or are bothersome): -flushing of the face or neck -headache -irregular heartbeat, palpitations -nausea, vomiting This list may not describe all possible side effects. Call your doctor for medical advice about side effects. You may report side effects to FDA at 1-800-FDA-1088. Where should I keep my medicine? Keep out of the reach of children. Store at room temperature between 20 and 25 degrees C (68 and 77 degrees F). Store in [employer]. Protect from  light and moisture. Keep tightly closed. Throw away any unused medicine after the expiration date. NOTE: This sheet is a summary. It may not cover all possible information. If you have questions about this medicine, talk to your doctor, pharmacist, or health care provider.  2015, Elsevier/Gold Standard. (2013-02-10 17:57:36)

## 2014-01-25 NOTE — Assessment & Plan Note (Signed)
With abnormal ETT  Family history and HTN  Patient prefers f/u non invasive study Cardiac CT preferred if this not approved can do myovue   SL nitro called in

## 2014-01-25 NOTE — Progress Notes (Signed)
Patient ID: Christina Burke, female   DOB: [birth year], 53 y.o.   MRN: [id number] 53 y.o seen in ER last week with atypical chest pain. R/O normal ECG and told to f/u with cardiology She has been under some stress with relationships Single parent to two teenagers. Divorced 3 years ago. Drinks on occasion not daily. Non smoker. Had positional left and right sided pain No pleurisy. No associated PND, orhtopnea or syncope Gets palpitations at times but nothing sustained. Long standing history of murmur Indicates murmur with [place] 20 years ago during her first pregnancy but no f/u. No history of SBE or congenital disease Chest pain not returned since ER d/c   9/23  ETT abnormal  With Exercise: significant ischemic ST depression; > 23mm  horizontal ST depression is seen in the inferior leads and > 1 mm horizontal depression is seen in V4-V6  Other Information Arrhythmia: No Angina during ETT: absent (0) Quality of ETT: diagnostic  ETT Interpretation: abnormal - evidence of ST depression  consistent with ischemia  Comments: Good exercise tolerance. Normotensive response to exercise. Duke score -14  Recommendations: Further evaluation for coronary disease is probably indicated.  Long discussion with patient  Options including cardiac CT, myovue and cath discussed she prefers non invasive evaluation first      ROS: Denies fever, malais, weight loss, blurry vision, decreased visual acuity, cough, sputum, SOB, hemoptysis, pleuritic pain, palpitaitons, heartburn, abdominal pain, melena, lower extremity edema, claudication, or rash.  All other systems reviewed and negative  General: Affect appropriate Healthy:  appears stated age 53: normal Neck supple with no adenopathy JVP normal no bruits no thyromegaly Lungs clear with no wheezing and good diaphragmatic motion Heart:  S1/S2 no murmur, no rub, gallop or click PMI normal Abdomen: benighn, BS positve, no tenderness, no AAA no bruit.  No  HSM or HJR Distal pulses intact with no bruits No edema Neuro non-focal Skin warm and dry No muscular weakness   Current Outpatient Prescriptions  Medication Sig Dispense Refill  . benazepril (LOTENSIN) 20 MG tablet Take 20 mg by mouth daily.      . clonazePAM (KLONOPIN) 1 MG tablet Take 1 mg by mouth 2 (two) times daily.      . hydrochlorothiazide (HYDRODIURIL) 25 MG tablet Take 25 mg by mouth daily.       No current facility-administered medications for this visit.    Allergies  Amoxicillin  Electrocardiogram:  SR rate 72 normal   Assessment and Plan

## 2014-01-25 NOTE — Assessment & Plan Note (Signed)
Well controlled.  Continue current medications and low sodium Dash type diet.   Continue lotensin

## 2014-01-30 ENCOUNTER — Ambulatory Visit (HOSPITAL_COMMUNITY)
Admission: RE | Admit: 2014-01-30 | Discharge: 2014-01-30 | Disposition: A | Discharge status: Home / Self Care | Payer: Medicaid Other | Source: Ambulatory Visit | Attending: Cardiovascular Disease | Admitting: Cardiovascular Disease

## 2014-01-30 DIAGNOSIS — R9439 Abnormal result of other cardiovascular function study: Secondary | ICD-10-CM | POA: Diagnosis present

## 2014-01-30 DIAGNOSIS — R9430 Abnormal result of cardiovascular function study, unspecified: Secondary | ICD-10-CM

## 2014-01-30 DIAGNOSIS — R079 Chest pain, unspecified: Secondary | ICD-10-CM | POA: Diagnosis not present

## 2014-01-30 IMAGING — CT CT HEART MORP W/ CTA COR W/ SCORE W/ CA W/CM &/OR W/O CM
1 of 10 series · 1 of 20 positions shown, 2 images · non-contrast
Comparison: No priors.

CLINICAL DATA: Chest pain abnormal exercise treadmill test

EXAM:
Cardiac CTA
MEDICATIONS:
Sub lingual nitro. 4mg and lopressor 15mg
TECHNIQUE: The patient was scanned on a [employer] [REDACTED]ice scanner. Gantry
rotation speed was 270 msecs. Collimation was .9mm. A 100 kV
prospective scan was triggered in the descending thoracic aorta at
111 HU's with 5% padding centered around 78% of the R-R interval.
Average HR during the scan was 74 bpm. The 3D data set was
interpreted on a dedicated work station using MPR, MIP and VRT
modes. A total of 80cc of contrast was used.

[Series 300: locator · axial · 0.35mm/px · z∈[-153,-153]mm · 1 of 1 slices shown, 2 images]
[im 1/1  vessel]
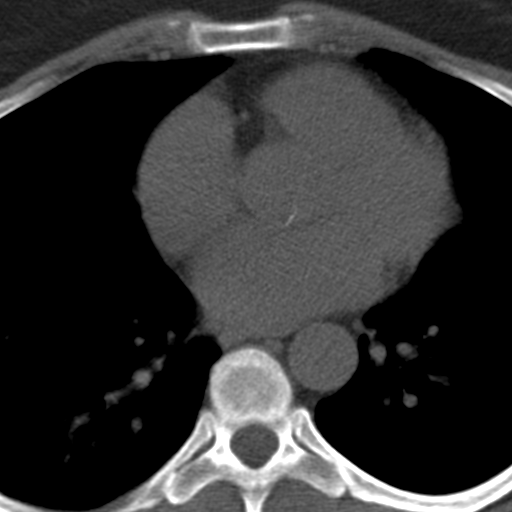
[im 1/1  lung]
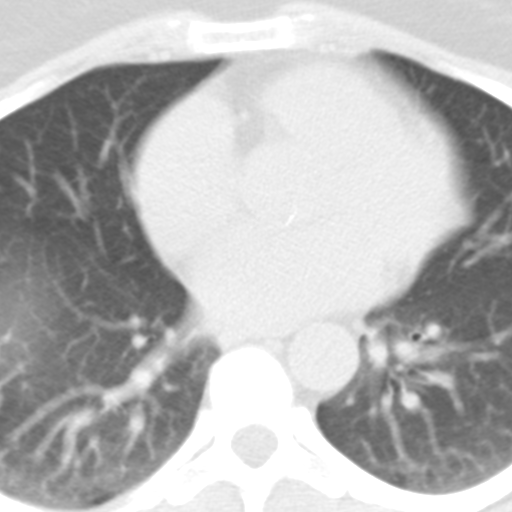

[1 of 20 positions shown; findings below may reference images not displayed]

FINDINGS: Non-cardiac: See separate report from [REDACTED]. No
significant findings on limited lung and soft tissue windows.

Calcium Score: 0

Aorta:  Normal root 3.4 cm Descending thoracic aorta 2.4 cm

Coronary Arteries: Right dominant with no anomalies

LM: Normal

LAD:  Normal

D1:  Small and normal

D2: Normal

Circumflex: Normal

OM1: Normal

RCA:  Normal

PDA: Normal

PLA:  Normal
IMPRESSION: 1) Normal right dominant coronary arteries

2) Calcium Score 0

3) Normal ascending aorta 3.4 cm

4) No significant non cardiac findings

METZLER

EXAM:
OVER-READ INTERPRETATION  CT CHEST

The following report is an over-read performed by radiologist Dr.
over-read does not include interpretation of cardiac or coronary
anatomy or pathology. The coronary calcium score/coronary CTA
interpretation by the cardiologist is attached.
FINDINGS: Within the visualized portions of the thorax there are no suspicious
appearing pulmonary nodules or masses, there is no acute
consolidative airspace disease, no lymphadenopathy and no
pneumothorax. Visualized portions of the upper abdomen are
unremarkable. There are no aggressive appearing lytic or blastic
lesions noted in the visualized portions of the skeleton.
IMPRESSION: 1. No significant incidental noncardiac findings noted.

## 2014-01-30 MED ORDER — NITROGLYCERIN 0.4 MG SL SUBL
SUBLINGUAL_TABLET | SUBLINGUAL | Status: AC
  Administered 2014-01-30: 0.4 mg
  Filled 2014-01-30: qty 1

## 2014-01-30 MED ORDER — IOHEXOL 350 MG/ML SOLN
80.0000 mL | Freq: Once | INTRAVENOUS | Status: AC | PRN
  Administered 2014-01-30: 80 mL via INTRAVENOUS

## 2014-01-30 MED ORDER — METOPROLOL TARTRATE 1 MG/ML IV SOLN
5.0000 mg | INTRAVENOUS | Status: DC | PRN
Start: 2014-01-30 — End: 2014-01-31
  Administered 2014-01-30 (×3): 5 mg via INTRAVENOUS
  Filled 2014-01-30: qty 5

## 2014-01-30 MED ORDER — NITROGLYCERIN 0.4 MG SL SUBL
0.4000 mg | SUBLINGUAL_TABLET | SUBLINGUAL | Status: DC | PRN
Start: 2014-01-30 — End: 2014-01-31
  Filled 2014-01-30: qty 25

## 2014-01-30 MED ORDER — METOPROLOL TARTRATE 1 MG/ML IV SOLN
INTRAVENOUS | Status: AC
  Filled 2014-01-30: qty 15

## 2014-05-22 ENCOUNTER — Emergency Department (HOSPITAL_COMMUNITY)
Admission: EM | Admit: 2014-05-22 | Discharge: 2014-05-22 | Disposition: A | Discharge status: Home / Self Care | Payer: No Typology Code available for payment source | Attending: Emergency Medicine | Admitting: Emergency Medicine

## 2014-05-22 ENCOUNTER — Encounter (HOSPITAL_COMMUNITY): Payer: Self-pay | Admitting: Emergency Medicine

## 2014-05-22 ENCOUNTER — Emergency Department (HOSPITAL_COMMUNITY): Payer: No Typology Code available for payment source

## 2014-05-22 DIAGNOSIS — I10 Essential (primary) hypertension: Secondary | ICD-10-CM | POA: Diagnosis not present

## 2014-05-22 DIAGNOSIS — Y9389 Activity, other specified: Secondary | ICD-10-CM | POA: Diagnosis not present

## 2014-05-22 DIAGNOSIS — Y92410 Unspecified street and highway as the place of occurrence of the external cause: Secondary | ICD-10-CM | POA: Insufficient documentation

## 2014-05-22 DIAGNOSIS — Y998 Other external cause status: Secondary | ICD-10-CM | POA: Diagnosis not present

## 2014-05-22 DIAGNOSIS — Z8639 Personal history of other endocrine, nutritional and metabolic disease: Secondary | ICD-10-CM | POA: Diagnosis not present

## 2014-05-22 DIAGNOSIS — S3991XA Unspecified injury of abdomen, initial encounter: Secondary | ICD-10-CM | POA: Insufficient documentation

## 2014-05-22 DIAGNOSIS — Z880 Allergy status to penicillin: Secondary | ICD-10-CM | POA: Diagnosis not present

## 2014-05-22 DIAGNOSIS — Z87891 Personal history of nicotine dependence: Secondary | ICD-10-CM | POA: Diagnosis not present

## 2014-05-22 DIAGNOSIS — Z79899 Other long term (current) drug therapy: Secondary | ICD-10-CM | POA: Diagnosis not present

## 2014-05-22 DIAGNOSIS — F419 Anxiety disorder, unspecified: Secondary | ICD-10-CM | POA: Insufficient documentation

## 2014-05-22 DIAGNOSIS — S3992XA Unspecified injury of lower back, initial encounter: Secondary | ICD-10-CM | POA: Diagnosis present
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
## 2014-05-22 MED ORDER — HYDROCODONE-ACETAMINOPHEN 5-325 MG PO TABS: 1.0000 | ORAL_TABLET | Freq: Four times a day (QID) | ORAL | Status: DC | PRN

## 2014-05-22 MED ORDER — KETOROLAC TROMETHAMINE 60 MG/2ML IM SOLN
60.0000 mg | Freq: Once | INTRAMUSCULAR | Status: AC
  Administered 2014-05-22: 60 mg via INTRAMUSCULAR
  Filled 2014-05-22: qty 2

## 2014-05-22 MED ORDER — PREDNISONE 50 MG PO TABS: 50.0000 mg | ORAL_TABLET | Freq: Every day | ORAL | Status: DC

## 2014-05-22 NOTE — Discharge Instructions (Signed)
Return here as needed. Follow up with your doctor. Use ice and heat on your lower back. The x-rays were normal.

## 2014-05-22 NOTE — ED Notes (Signed)
Declined W/C at D/C and was escorted to lobby by RN. 
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
## 2014-06-09 ENCOUNTER — Ambulatory Visit: Payer: Medicaid Other | Admitting: Gastroenterology

## 2014-07-28 ENCOUNTER — Telehealth: Payer: Self-pay | Admitting: Gastroenterology

## 2014-07-28 NOTE — Telephone Encounter (Signed)
Rec'd from [facility] forward 4 pages to [place]

## 2014-08-01 ENCOUNTER — Ambulatory Visit: Payer: Medicaid Other | Admitting: Gastroenterology

## 2014-08-10 ENCOUNTER — Ambulatory Visit (INDEPENDENT_AMBULATORY_CARE_PROVIDER_SITE_OTHER): Payer: Medicaid Other | Admitting: Gastroenterology

## 2014-08-10 ENCOUNTER — Encounter: Payer: Self-pay | Admitting: Gastroenterology

## 2014-08-10 VITALS — BP 124/76 | HR 80 | Ht 63.5 in | Wt 198.0 lb

## 2014-08-10 DIAGNOSIS — K219 Gastro-esophageal reflux disease without esophagitis: Secondary | ICD-10-CM | POA: Diagnosis not present

## 2014-08-10 HISTORY — DX: Gastro-esophageal reflux disease without esophagitis: K21.9

## 2014-08-10 MED ORDER — DEXLANSOPRAZOLE 60 MG PO CPDR: 60.0000 mg | DELAYED_RELEASE_CAPSULE | Freq: Every day | ORAL | Status: DC

## 2014-08-10 NOTE — Patient Instructions (Signed)

## 2014-08-10 NOTE — Progress Notes (Signed)
_                                                                                                                History of Present Illness:  Ms. Christina Burke is a 54 year old white female referred at the request of Dr. Pricilla Holm for evaluation of chest discomfort and burning.  This has been a problem for years.  She complains of burning in the back of her throat and chest and sometimes the inability to belch.  She's had intermittent dysphagia to solids.  She denies sore throat or hoarseness.  They occur anytime during the day.  She has tried over-the-counter Prilosec and Nexium without much improvement.  She is on no gastric irritants including nonsteroidals.  She had a screening colonoscopy approximately 3 years ago and claims it was normal.  Past Medical History  Diagnosis Date  . Hypertension   . Anxiety   . Hyperlipidemia    Past Surgical History  Procedure Laterality Date  . Abdominal surgery    . Abdominal hysterectomy     family history includes Colon polyps in her sister; Diabetes in her brother and sister; Gallbladder disease in her mother; Heart disease in her father. There is no history of Colon cancer, Kidney disease, or Esophageal cancer. Current Outpatient Prescriptions  Medication Sig Dispense Refill  . benazepril (LOTENSIN) 20 MG tablet Take 20 mg by mouth daily.    . clonazePAM (KLONOPIN) 1 MG tablet Take 1 mg by mouth 2 (two) times daily.    . hydrochlorothiazide (HYDRODIURIL) 25 MG tablet Take 25 mg by mouth daily.    Marland Kitchen HYDROcodone-acetaminophen (NORCO/VICODIN) 5-325 MG per tablet Take 1 tablet by mouth every 6 (six) hours as needed for moderate pain. 20 tablet 0  . nitroGLYCERIN (NITROSTAT) 0.4 MG SL tablet Place 1 tablet (0.4 mg total) under the tongue every 5 (five) minutes as needed. 25 tablet 3   No current facility-administered medications for this visit.   Allergies as of 08/10/2014 - Review Complete 08/10/2014  Allergen Reaction Noted  .  Amoxicillin Rash 07/24/2013    reports that she has never smoked. She has never used smokeless tobacco. She reports that she drinks alcohol. She reports that she does not use illicit drugs.   Review of Systems: Pertinent positive and negative review of systems were noted in the above HPI section. All other review of systems were otherwise negative.  Vital signs were reviewed in today's medical record Physical Exam: General: Well developed , well nourished, no acute distress Skin: anicteric Head: Normocephalic and atraumatic Eyes:  sclerae anicteric, EOMI Ears: Normal auditory acuity Mouth: No deformity or lesions Neck: Supple, no masses or thyromegaly Lungs: Clear throughout to auscultation Heart: Regular rate and rhythm; no murmurs, rubs or bruits Abdomen: Soft, non tender and non distended. No masses, hepatosplenomegaly or hernias noted. Normal Bowel sounds.  There is no succussion splash Rectal:deferred Musculoskeletal: Symmetrical with no gross deformities  Skin: No lesions on visible extremities Pulses:  Normal pulses noted Extremities: No clubbing, cyanosis,  edema or deformities noted Neurological: Alert oriented x 4, grossly nonfocal Cervical Nodes:  No significant cervical adenopathy Inguinal Nodes: No significant inguinal adenopathy Psychological:  Alert and cooperative. Normal mood and affect  See Assessment and Plan under Problem List

## 2014-08-10 NOTE — Addendum Note (Signed)
Addended by: Oda Kilts on: 08/10/2014 02:00 PM   Modules accepted: Orders

## 2014-08-10 NOTE — Assessment & Plan Note (Signed)
Patient is symptomatic despite PPI therapy with Prilosec and Nexium.  She also has some dysphagia which may represent an early esophageal stricture.  Recommendations #1 antireflux measures #2 dexilant 60 Mill grams daily #3 upper endoscopy with dilation as indicated

## 2014-08-17 ENCOUNTER — Telehealth: Payer: Self-pay | Admitting: Gastroenterology

## 2014-08-17 ENCOUNTER — Ambulatory Visit (AMBULATORY_SURGERY_CENTER): Payer: Medicaid Other | Admitting: Gastroenterology

## 2014-08-17 ENCOUNTER — Encounter: Payer: Self-pay | Admitting: Gastroenterology

## 2014-08-17 VITALS — BP 92/52 | HR 63 | Temp 97.9°F | Resp 16 | Ht 63.5 in | Wt 198.0 lb

## 2014-08-17 DIAGNOSIS — R1310 Dysphagia, unspecified: Secondary | ICD-10-CM

## 2014-08-17 DIAGNOSIS — K222 Esophageal obstruction: Secondary | ICD-10-CM

## 2014-08-17 DIAGNOSIS — K219 Gastro-esophageal reflux disease without esophagitis: Secondary | ICD-10-CM | POA: Diagnosis not present

## 2014-08-17 MED ORDER — SODIUM CHLORIDE 0.9 % IV SOLN: 500.0000 mL | INTRAVENOUS | Status: DC

## 2014-08-17 NOTE — Patient Instructions (Signed)
Impressions/recommendations:  Stricture (handout given) Dilation diet (handout given)  Continue Dexilant Dilation as needed  YOU HAD AN ENDOSCOPIC PROCEDURE TODAY AT [facility]:   Refer to the procedure report that was given to you for any specific questions about what was found during the examination.  If the procedure report does not answer your questions, please call your gastroenterologist to clarify.  If you requested that your care partner not be given the details of your procedure findings, then the procedure report has been included in a sealed envelope for you to review at your convenience later.  YOU SHOULD EXPECT: Some feelings of bloating in the abdomen. Passage of more gas than usual.  Walking can help get rid of the air that was put into your GI tract during the procedure and reduce the bloating. If you had a lower endoscopy (such as a colonoscopy or flexible sigmoidoscopy) you may notice spotting of blood in your stool or on the toilet paper. If you underwent a bowel prep for your procedure, you may not have a normal bowel movement for a few days.  Please Note:  You might notice some irritation and congestion in your nose or some drainage.  This is from the oxygen used during your procedure.  There is no need for concern and it should clear up in a day or so.  SYMPTOMS TO REPORT IMMEDIATELY:    Following upper endoscopy (EGD)  Vomiting of blood or coffee ground material  New chest pain or pain under the shoulder blades  Painful or persistently difficult swallowing  New shortness of breath  Fever of 100F or higher  Black, tarry-looking stools  For urgent or emergent issues, a gastroenterologist can be reached at any hour by calling [phone].  DIET: Your first meal following the procedure should be a small meal and then it is ok to progress to your normal diet. Heavy or fried foods are harder to digest and may make you feel nauseous or bloated.   Likewise, meals heavy in dairy and vegetables can increase bloating.  Drink plenty of fluids but you should avoid alcoholic beverages for 24 hours.  ACTIVITY:  You should plan to take it easy for the rest of today and you should NOT DRIVE or use heavy machinery until tomorrow (because of the sedation medicines used during the test).    FOLLOW UP: Our staff will call the number listed on your records the next business day following your procedure to check on you and address any questions or concerns that you may have regarding the information given to you following your procedure. If we do not reach you, we will leave a message.  However, if you are feeling well and you are not experiencing any problems, there is no need to return our call.  We will assume that you have returned to your regular daily activities without incident.  If any biopsies were taken you will be contacted by phone or by letter within the next 1-3 weeks.  Please call us at [phone] if you have not heard about the biopsies in 3 weeks.    SIGNATURES/CONFIDENTIALITY: You and/or your care partner have signed paperwork which will be entered into your electronic medical record.  These signatures attest to the fact that that the information above on your After Visit Summary has been reviewed and is understood.  Full responsibility of the confidentiality of this discharge information lies with you and/or your care-partner.

## 2014-08-17 NOTE — Progress Notes (Signed)
A/ox3 pleased with MAC, report to Tracy RN 

## 2014-08-17 NOTE — Progress Notes (Signed)
Patient states that Dexilant is not covered with Medicaid. Spoke with Dr. Deatra Ina, he wants patient to take Vashon and asked Beth to follow up on. [facility], she will look into getting this resolved and be in touch with patient.

## 2014-08-17 NOTE — Progress Notes (Signed)
Called to room to assist during endoscopic procedure.  Patient ID and intended procedure confirmed with present staff. Received instructions for my participation in the procedure from the performing physician.  

## 2014-08-17 NOTE — Progress Notes (Signed)
Pt has dried fever blister lower lip, reported to anesthesia

## 2014-08-17 NOTE — Telephone Encounter (Signed)
Received 4 pages from [facility]. Sent to Dr. Deatra Ina. 08/17/14/ss

## 2014-08-17 NOTE — Telephone Encounter (Signed)
Received medical records from [facility]. Sent to Dr. Deatra Ina. 08/17/14/ss.

## 2014-08-17 NOTE — Op Note (Signed)
[place]  [address]. [place], [address]   ENDOSCOPY PROCEDURE REPORT  PATIENT: Christina Burke, Christina Burke  MR#: [id number] BIRTHDATE: 01-08-1961 , 57  yrs. old GENDER: female ENDOSCOPIST: Inda Castle, MD REFERRED BY: PROCEDURE DATE:  08/17/2014 PROCEDURE:  EGD, diagnostic and Maloney dilation of esophagus ASA CLASS:     Class II INDICATIONS:  dysphagia. MEDICATIONS: Monitored anesthesia care and Propofol 150 mg IV TOPICAL ANESTHETIC:  DESCRIPTION OF PROCEDURE: After the risks benefits and alternatives of the procedure were thoroughly explained, informed consent was obtained.  The LB [id number] endoscope was introduced through the mouth and advanced to the second portion of the duodenum , Without limitations.  The instrument was slowly withdrawn as the mucosa was fully examined.    ESOPHAGUS: There was a peptic stricture at the gastroesophageal junction.  The stricture was traversable.  The stricture was dilated using a 20mm (52Fr) Maloney dilator.  there was mild resistance and no heme. Except for the findings listed, the EGD was otherwise normal.  Retroflexed views revealed no abnormalities. The scope was then withdrawn from the patient and the procedure completed.  COMPLICATIONS: There were no immediate complications.  ENDOSCOPIC IMPRESSION: 1.   There was a stricture at the gastroesophageal junction; The stricture was dilated using a 45mm (52Fr) Maloney dilator 2.   EGD was otherwise normal  RECOMMENDATIONS: Continue dexilant repeat dilation as needed  REPEAT EXAM:  eSigned:  Inda Castle, MD 08/17/2014 2:32 PM    CC: Anders Grant, PA-C

## 2014-08-18 ENCOUNTER — Telehealth: Payer: Self-pay | Admitting: *Deleted

## 2014-08-18 NOTE — Telephone Encounter (Signed)
  Follow up Call-  Call back number 08/17/2014  Post procedure Call Back phone  # [phone]  Permission to leave phone message Yes     Patient questions:  Do you have a fever, pain , or abdominal swelling? No. Pain Score  0 *  Have you tolerated food without any problems? Yes.    Have you been able to return to your normal activities? Yes.    Do you have any questions about your discharge instructions: Diet   No. Medications  No. Follow up visit  No.  Do you have questions or concerns about your Care? No.  Actions: * If pain score is 4 or above: No action needed, pain <4.

## 2014-08-23 ENCOUNTER — Telehealth: Payer: Self-pay | Admitting: Gastroenterology

## 2014-08-23 MED ORDER — OMEPRAZOLE 40 MG PO CPDR: 40.0000 mg | DELAYED_RELEASE_CAPSULE | Freq: Every day | ORAL | Status: DC

## 2014-08-23 NOTE — Telephone Encounter (Signed)
Ok

## 2014-08-23 NOTE — Telephone Encounter (Signed)
FYI DR Deatra Ina,  PTS INSURANCE WOULD NOT COVER DEXILANT BECAUSE SHE HAD NEVER BEEN ON ANOTHER PPI. SENT HER IN OMEPRAZOLE 40MG  ONCE A DAY

## 2015-03-01 ENCOUNTER — Emergency Department (HOSPITAL_COMMUNITY): Payer: Medicaid Other

## 2015-03-01 ENCOUNTER — Emergency Department (HOSPITAL_COMMUNITY)
Admission: EM | Admit: 2015-03-01 | Discharge: 2015-03-01 | Disposition: A | Discharge status: Home / Self Care | Payer: Medicaid Other | Attending: Emergency Medicine | Admitting: Emergency Medicine

## 2015-03-01 ENCOUNTER — Encounter (HOSPITAL_COMMUNITY): Payer: Self-pay | Admitting: Emergency Medicine

## 2015-03-01 DIAGNOSIS — Z880 Allergy status to penicillin: Secondary | ICD-10-CM | POA: Insufficient documentation

## 2015-03-01 DIAGNOSIS — Z79899 Other long term (current) drug therapy: Secondary | ICD-10-CM | POA: Diagnosis not present

## 2015-03-01 DIAGNOSIS — I10 Essential (primary) hypertension: Secondary | ICD-10-CM | POA: Diagnosis not present

## 2015-03-01 DIAGNOSIS — R5383 Other fatigue: Secondary | ICD-10-CM | POA: Diagnosis not present

## 2015-03-01 DIAGNOSIS — R42 Dizziness and giddiness: Secondary | ICD-10-CM | POA: Diagnosis present

## 2015-03-01 DIAGNOSIS — R55 Syncope and collapse: Secondary | ICD-10-CM | POA: Diagnosis not present

## 2015-03-01 DIAGNOSIS — F419 Anxiety disorder, unspecified: Secondary | ICD-10-CM | POA: Insufficient documentation

## 2015-03-01 DIAGNOSIS — Z8639 Personal history of other endocrine, nutritional and metabolic disease: Secondary | ICD-10-CM | POA: Diagnosis not present

## 2015-03-01 DIAGNOSIS — Z8719 Personal history of other diseases of the digestive system: Secondary | ICD-10-CM | POA: Insufficient documentation

## 2015-03-01 LAB — URINALYSIS, ROUTINE W REFLEX MICROSCOPIC
BILIRUBIN URINE: NEGATIVE
Glucose, UA: NEGATIVE mg/dL
HGB URINE DIPSTICK: NEGATIVE
KETONES UR: 40 mg/dL — AB
Leukocytes, UA: NEGATIVE
Nitrite: NEGATIVE
PROTEIN: NEGATIVE mg/dL
Specific Gravity, Urine: 1.016 (ref 1.005–1.030)
UROBILINOGEN UA: 0.2 mg/dL (ref 0.0–1.0)
pH: 6.5 (ref 5.0–8.0)

## 2015-03-01 LAB — CBC WITH DIFFERENTIAL/PLATELET
BASOS ABS: 0 10*3/uL (ref 0.0–0.1)
Basophils Relative: 1 %
EOS ABS: 0.1 10*3/uL (ref 0.0–0.7)
EOS PCT: 1 %
HCT: 39.5 % (ref 36.0–46.0)
Hemoglobin: 13 g/dL (ref 12.0–15.0)
LYMPHS ABS: 1.3 10*3/uL (ref 0.7–4.0)
Lymphocytes Relative: 22 %
MCH: 30 pg (ref 26.0–34.0)
MCHC: 32.9 g/dL (ref 30.0–36.0)
MCV: 91 fL (ref 78.0–100.0)
Monocytes Absolute: 0.3 10*3/uL (ref 0.1–1.0)
Monocytes Relative: 5 %
Neutro Abs: 4.4 10*3/uL (ref 1.7–7.7)
Neutrophils Relative %: 71 %
PLATELETS: 215 10*3/uL (ref 150–400)
RBC: 4.34 MIL/uL (ref 3.87–5.11)
RDW: 13.4 % (ref 11.5–15.5)
WBC: 6.1 10*3/uL (ref 4.0–10.5)

## 2015-03-01 LAB — BASIC METABOLIC PANEL
Anion gap: 13 (ref 5–15)
BUN: 17 mg/dL (ref 6–20)
CALCIUM: 9.6 mg/dL (ref 8.9–10.3)
CO2: 24 mmol/L (ref 22–32)
CREATININE: 0.86 mg/dL (ref 0.44–1.00)
Chloride: 101 mmol/L (ref 101–111)
Glucose, Bld: 88 mg/dL (ref 65–99)
Potassium: 3.4 mmol/L — ABNORMAL LOW (ref 3.5–5.1)
SODIUM: 138 mmol/L (ref 135–145)

## 2015-03-01 LAB — RAPID URINE DRUG SCREEN, HOSP PERFORMED
AMPHETAMINES: NOT DETECTED
Barbiturates: NOT DETECTED
Benzodiazepines: NOT DETECTED
Cocaine: NOT DETECTED
OPIATES: NOT DETECTED
Tetrahydrocannabinol: NOT DETECTED

## 2015-03-01 LAB — I-STAT TROPONIN, ED: Troponin i, poc: 0 ng/mL (ref 0.00–0.08)

## 2015-03-01 IMAGING — DX DG CHEST 2V
2 series · 2 of 2 positions shown · non-contrast
Comparison: Prior chest x-ray [DATE]

CLINICAL DATA: 54-year-old female with shortness of breath,
weakness and dizziness

EXAM:
CHEST  2 VIEW

[chest pa]
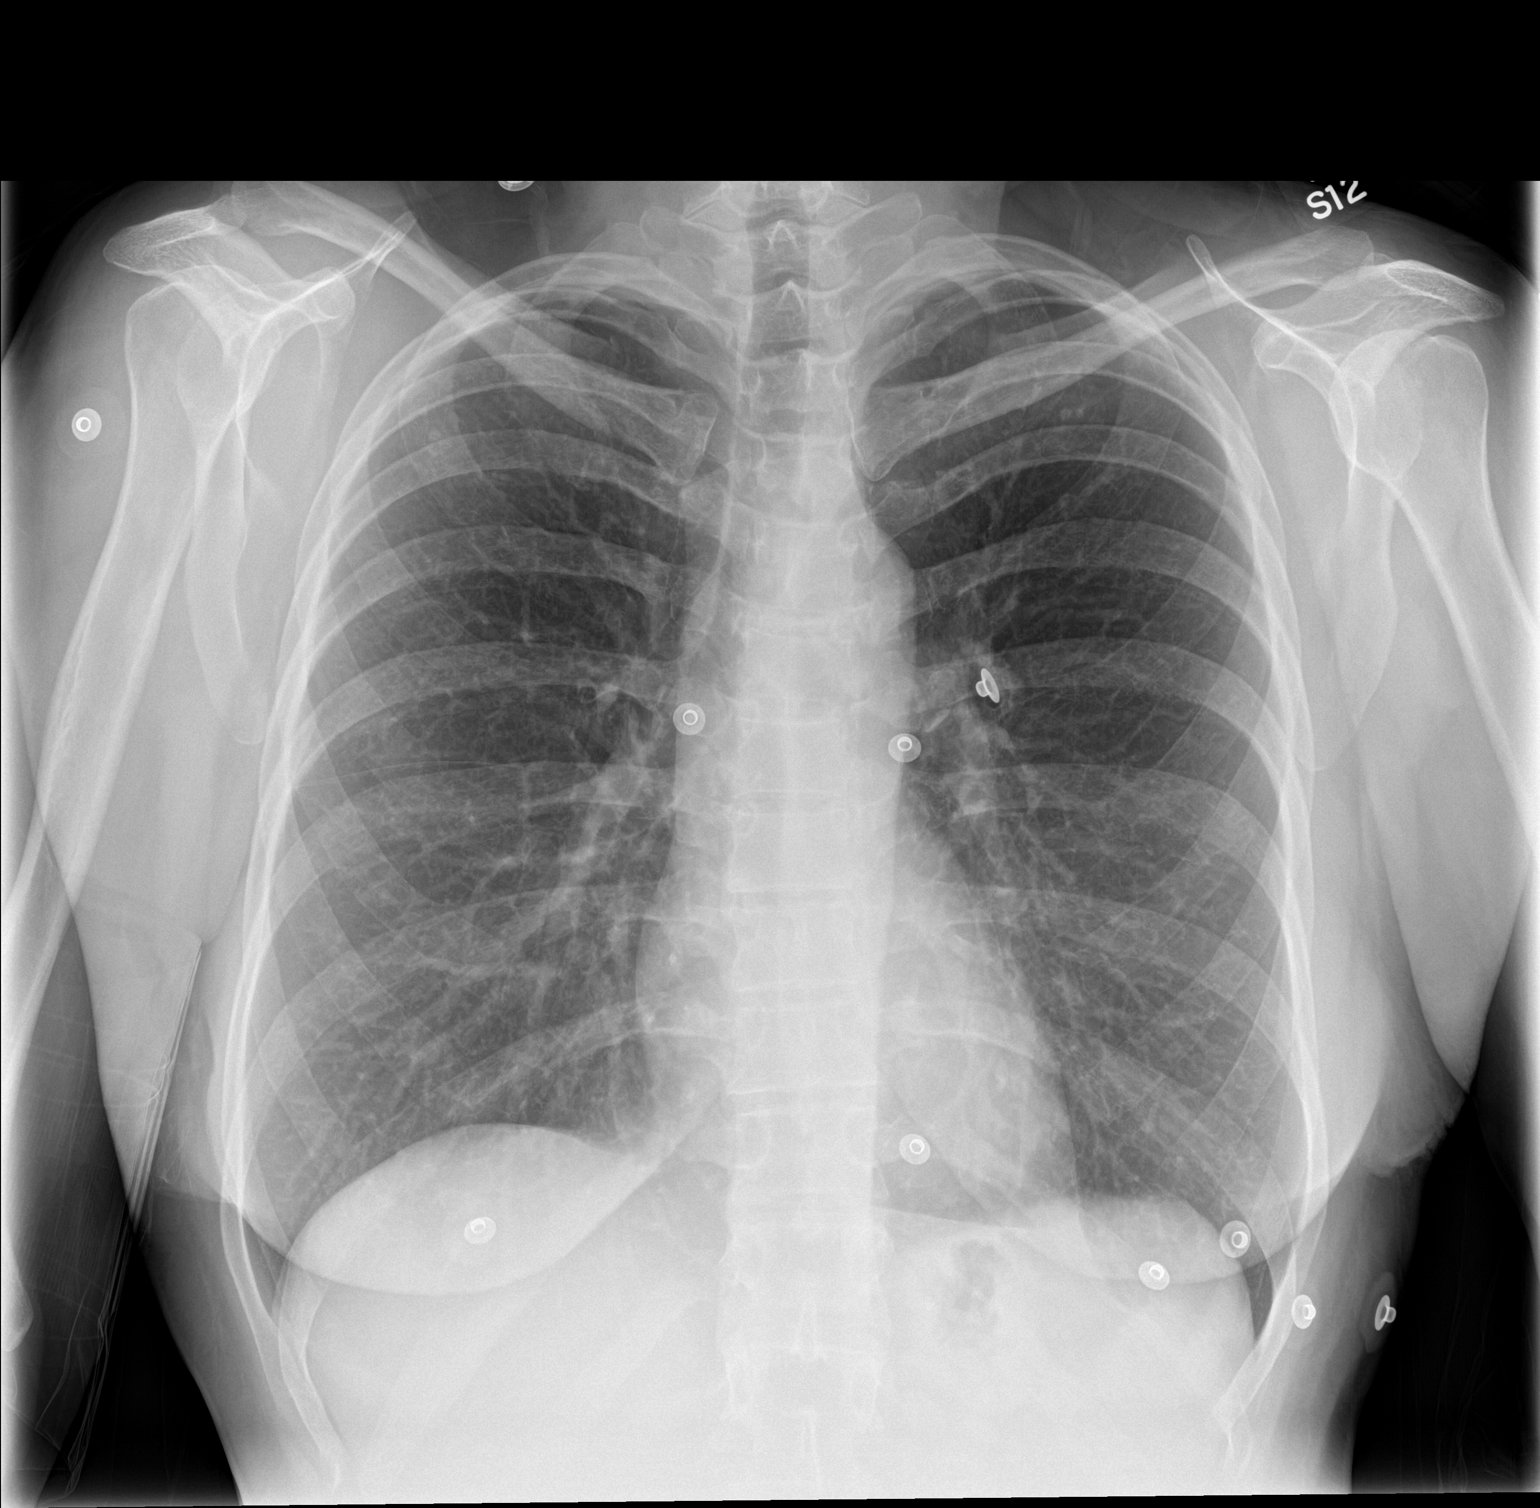

[chest lat]
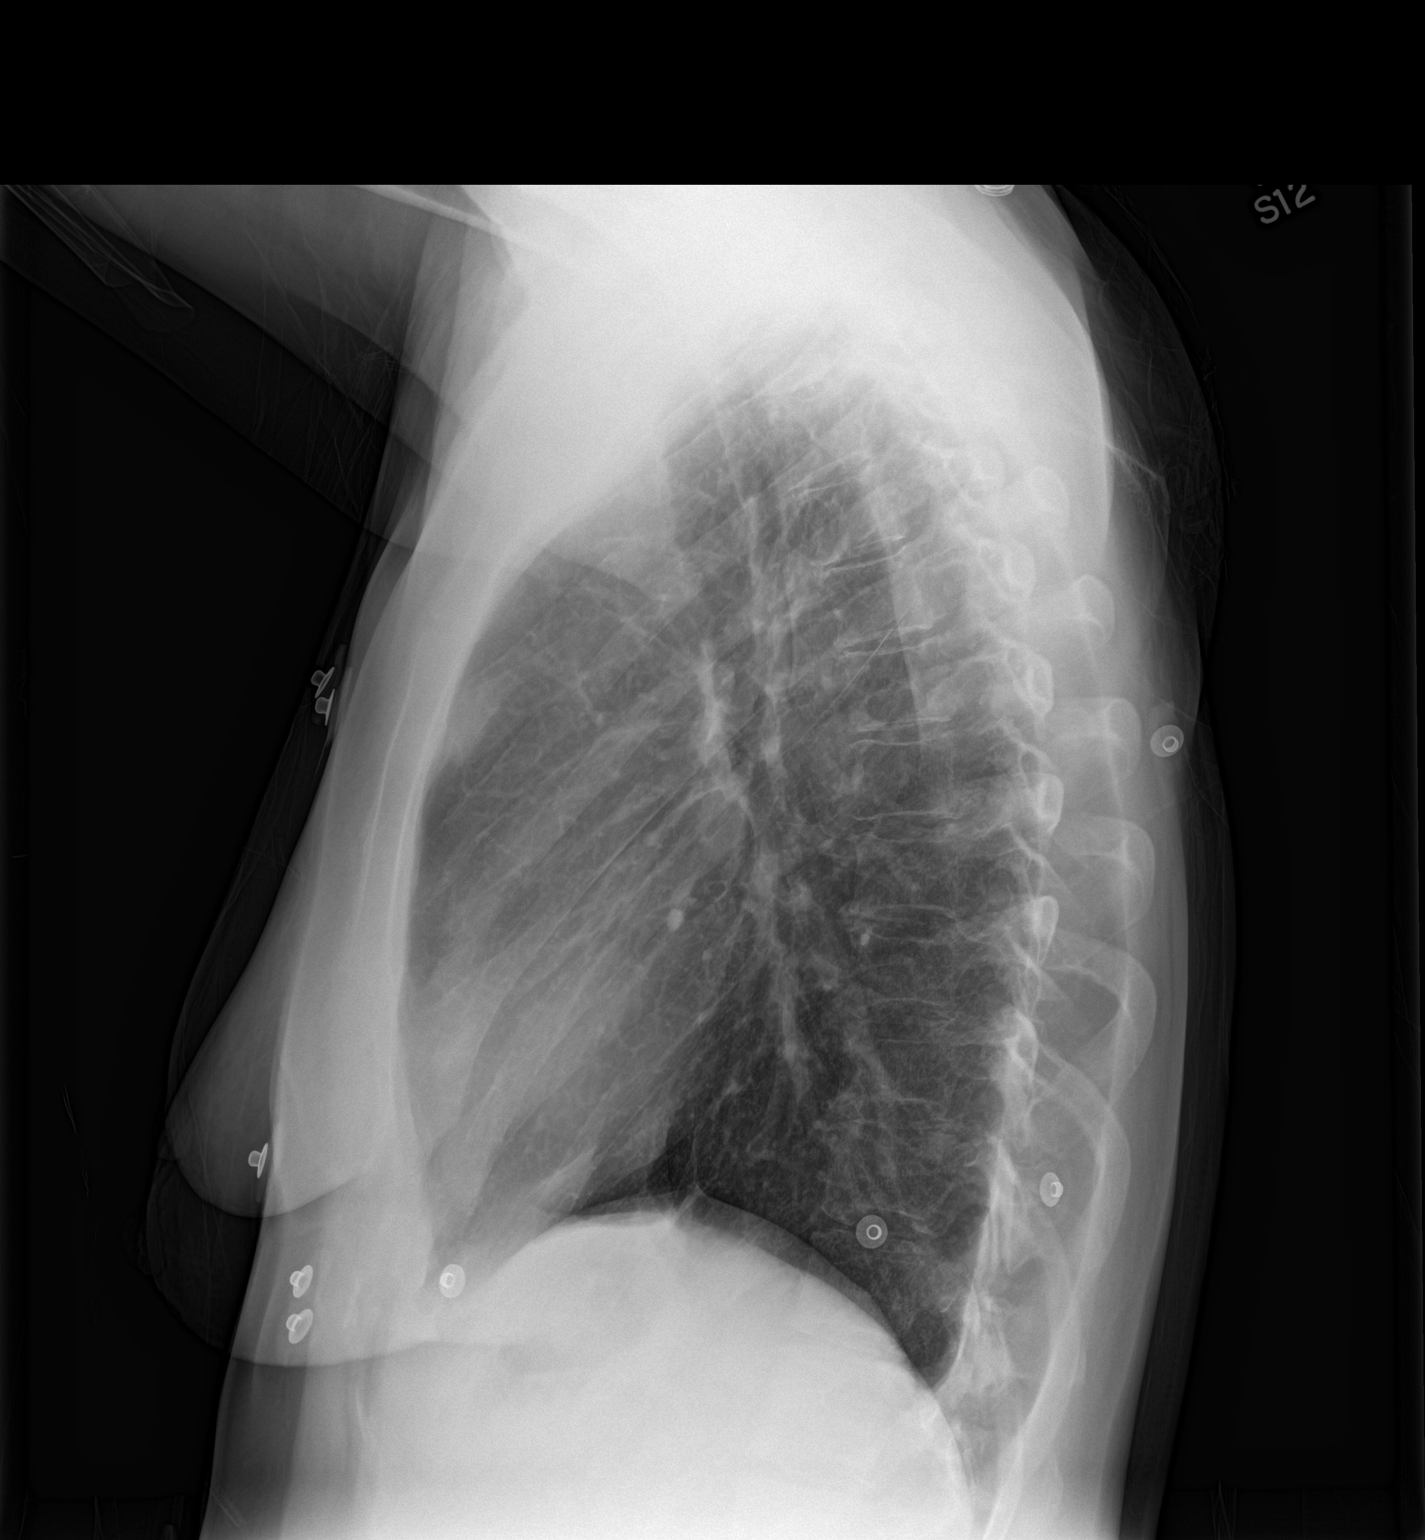

[2 of 2 positions shown; findings below may reference images not displayed]

FINDINGS: The lungs are clear and negative for focal airspace consolidation,
pulmonary edema or suspicious pulmonary nodule. No pleural effusion
or pneumothorax. Cardiac and mediastinal contours are within normal
limits. No acute fracture or lytic or blastic osseous lesions. The
visualized upper abdominal bowel gas pattern is unremarkable.
IMPRESSION: Negative chest x-ray.
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
## 2015-03-01 NOTE — Discharge Instructions (Signed)
Dizziness Dizziness is a common problem. It makes you feel unsteady or lightheaded. You may feel like you are about to pass out (faint). Dizziness can lead to injury if you stumble or fall. Anyone can get dizzy, but dizziness is more common in older adults. This condition can be caused by a number of things, including:  Medicines.  Dehydration.  Illness. HOME CARE Following these instructions may help with your condition: Eating and Drinking  Drink enough fluid to keep your pee (urine) clear or pale yellow. This helps to keep you from getting dehydrated. Try to drink more clear fluids, such as water.  Do not drink alcohol.  Limit how much caffeine you drink or eat if told by your doctor.  Limit how much salt you drink or eat if told by your doctor. Activity  Avoid making quick movements.  When you stand up from sitting in a chair, steady yourself until you feel okay.  In the morning, first sit up on the side of the bed. When you feel okay, stand slowly while you hold onto something. Do this until you know that your balance is fine.  Move your legs often if you need to stand in one place for a long time. Tighten and relax your muscles in your legs while you are standing.  Do not drive or use heavy machinery if you feel dizzy.  Avoid bending down if you feel dizzy. Place items in your home so that they are easy for you to reach without leaning over. Lifestyle  Do not use any tobacco products, including cigarettes, chewing tobacco, or electronic cigarettes. If you need help quitting, ask your doctor.  Try to lower your stress level, such as with yoga or meditation. Talk with your doctor if you need help. General Instructions  Watch your dizziness for any changes.  Take medicines only as told by your doctor. Talk with your doctor if you think that your dizziness is caused by a medicine that you are taking.  Tell a friend or a family member that you are feeling dizzy. If he or  she notices any changes in your behavior, have this person call your doctor.  Keep all follow-up visits as told by your doctor. This is important. GET HELP IF:  Your dizziness does not go away.  Your dizziness or light-headedness gets worse.  You feel sick to your stomach (nauseous).  You have trouble hearing.  You have new symptoms.  You are unsteady on your feet or you feel like the room is spinning. GET HELP RIGHT AWAY IF:  You throw up (vomit) or have diarrhea and are unable to eat or drink anything.  You have trouble:  Talking.  Walking.  Swallowing.  Using your arms, hands, or legs.  You feel generally weak.  You are not thinking clearly or you have trouble forming sentences. It may take a friend or family member to notice this.  You have:  Chest pain.  Pain in your belly (abdomen).  Shortness of breath.  Sweating.  Your vision changes.  You are bleeding.  You have a headache.  You have neck pain or a stiff neck.  You have a fever.   This information is not intended to replace advice given to you by your health care provider. Make sure you discuss any questions you have with your health care provider.   Document Released: 04/03/2011 Document Revised: 08/29/2014 Document Reviewed: 04/10/2014 Elsevier Interactive Patient Education 2016 [employer]  Fatigue Fatigue is feeling  tired all of the time, a lack of energy, or a lack of motivation. Occasional or mild fatigue is often a normal response to activity or life in general. However, long-lasting (chronic) or extreme fatigue may indicate an underlying medical condition. HOME CARE INSTRUCTIONS  Watch your fatigue for any changes. The following actions may help to lessen any discomfort you are feeling:  Talk to your health care provider about how much sleep you need each night. Try to get the required amount every night.  Take medicines only as directed by your health care provider.  Eat a  healthy and nutritious diet. Ask your health care provider if you need help changing your diet.  Drink enough fluid to keep your urine clear or pale yellow.  Practice ways of relaxing, such as yoga, meditation, massage therapy, or acupuncture.  Exercise regularly.   Change situations that cause you stress. Try to keep your work and personal routine reasonable.  Do not abuse illegal drugs.  Limit alcohol intake to no more than 1 drink per day for nonpregnant women and 2 drinks per day for men. One drink equals 12 ounces of beer, 5 ounces of wine, or 1 ounces of hard liquor.  Take a multivitamin, if directed by your health care provider. SEEK MEDICAL CARE IF:   Your fatigue does not get better.  You have a fever.   You have unintentional weight loss or gain.  You have headaches.   You have difficulty:   Falling asleep.  Sleeping throughout the night.  You feel angry, guilty, anxious, or sad.   You are unable to have a bowel movement (constipation).   You skin is dry.   Your legs or another part of your body is swollen.  SEEK IMMEDIATE MEDICAL CARE IF:   You feel confused.   Your vision is blurry.  You feel faint or pass out.   You have a severe headache.   You have severe abdominal, pelvic, or back pain.   You have chest pain, shortness of breath, or an irregular or fast heartbeat.   You are unable to urinate or you urinate less than normal.   You develop abnormal bleeding, such as bleeding from the rectum, vagina, nose, lungs, or nipples.  You vomit blood.   You have thoughts about harming yourself or committing suicide.   You are worried that you might harm someone else.    This information is not intended to replace advice given to you by your health care provider. Make sure you discuss any questions you have with your health care provider.   Document Released: 02/09/2007 Document Revised: 05/05/2014 Document Reviewed:  08/16/2013 Elsevier Interactive Patient Education [employer].

## 2015-03-01 NOTE — ED Provider Notes (Signed)
Arrival Date & Time: 03/01/15 & 1549 History   Chief Complaint  Patient presents with  . Shortness of Breath  . Dizziness  . Near Syncope  . Nausea   HPI Christina Burke is a 54 y.o. female who presents after a presyncopal episode with dizziness and lightheadedness without vertiginous symptoms that occurred around 12:15. 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 Patient states she became cold clammy felt weak vision began to "become very gray". Patient went to car turned on air conditioner did not feel better and made decision to call EMS. EMS arrived and transported the patient to the emergency department and patient endorses all symptomatically resolution except for a sensation that patient believes "I think I need to use the restroom". When asked clarify she states that she feels as if she has developed diarrhea over the past 2 days. No urinary symptoms no abdominal pain no back pain no chest pain or shortness of breath currently.   Past on I of having withdrawal from the fungus or ascites really Medical History  I reviewed & agree with nursing's documentation on PMHx, PSHx, SHx and FHx. Past Medical History  Diagnosis Date  . Hypertension   . Anxiety   . Hyperlipidemia   . GERD (gastroesophageal reflux disease) 08/10/2014   Past Surgical History  Procedure Laterality Date  . Abdominal surgery    . Abdominal hysterectomy     Social History   Social History  . Marital Status: Legally Separated    Spouse Name: N/A  . Number of Children: 2  . Years of Education: N/A   Occupational History  . Unemployed    Social History Main Topics  . Smoking status: Never Smoker   . Smokeless tobacco: Never Used  . Alcohol Use: 0.0 oz/week    0 Standard drinks or equivalent per week     Comment: occsional  . Drug Use: No  . Sexual Activity: Yes    Birth Control/  Protection: None   Other Topics Concern  . None   Social History Narrative   Family History  Problem Relation Age of Onset  . Colon cancer Neg Hx   . Colon polyps Sister   . Diabetes Sister     x3  . Diabetes Brother   . Kidney disease Neg Hx   . Gallbladder disease Mother   . Esophageal cancer Neg Hx   . Heart disease Father    Review of Systems   Complete ROS performed by me, all positives & negatives documented as above in HPI. All other ROS negative.  Allergies  Amoxicillin  Home Medications   Prior to Admission medications   Medication Sig Start Date End Date Taking? Authorizing Provider  clonazePAM (KLONOPIN) 1 MG tablet Take 1 mg by mouth 2 (two) times daily.   Yes Historical Provider, MD  hydrochlorothiazide (HYDRODIURIL) 25 MG tablet Take 25 mg by mouth daily.   Yes Historical Provider, MD  benazepril (LOTENSIN) 20 MG tablet Take 20 mg by mouth daily.    Historical Provider, MD  dexlansoprazole (DEXILANT) 60 MG capsule Take 1 capsule (60 mg total) by mouth daily. Patient not taking: Reported on 08/17/2014 08/10/14   Inda Castle, MD  HYDROcodone-acetaminophen (NORCO/VICODIN) 5-325 MG per tablet Take 1 tablet by mouth every 6 (six) hours as needed for moderate pain. Patient not taking: Reported on 08/17/2014 05/22/14   Dalia Heading,  PA-C  nitroGLYCERIN (NITROSTAT) 0.4 MG SL tablet Place 1 tablet (0.4 mg total) under the tongue every 5 (five) minutes as needed. 01/25/14   Josue Hector, MD  omeprazole (PRILOSEC) 40 MG capsule Take 1 capsule (40 mg total) by mouth daily. Patient not taking: Reported on 03/01/2015 08/23/14   Inda Castle, MD    Physical Exam  BP 134/67 mmHg  Pulse 68  Temp(Src) 98.6 F (37 C) (Oral)  Resp 10  Ht 5\' 3"  (1.6 m)  Wt 158 lb (71.668 kg)  BMI 28.00 kg/m2  SpO2 96% Physical Exam  Constitutional: She is oriented to person, place, and time. She appears well-developed and well-nourished.  Non-toxic appearance. She does not appear  ill. No distress.  HENT:  Head: Normocephalic and atraumatic.  Right Ear: External ear normal.  Left Ear: External ear normal.  Eyes: Pupils are equal, round, and reactive to light. No scleral icterus.  Neck: Normal range of motion. Neck supple. No tracheal deviation present.  Cardiovascular: Normal heart sounds and intact distal pulses.   No murmur heard. Pulmonary/Chest: Effort normal and breath sounds normal. No stridor. No respiratory distress. She has no wheezes. She has no rales.  Abdominal: Soft. Bowel sounds are normal. She exhibits no distension. There is no tenderness. There is no rebound and no guarding.  Musculoskeletal: Normal range of motion.  Neurological: She is alert and oriented to person, place, and time. She has normal strength and normal reflexes. No cranial nerve deficit or sensory deficit.  Skin: Skin is warm and dry. No pallor.  Psychiatric: She has a normal mood and affect. Her behavior is normal.  Nursing note and vitals reviewed.  ED Course  Procedures Labs Review Labs Reviewed  BASIC METABOLIC PANEL - Abnormal; Notable for the following:    Potassium 3.4 (*)    All other components within normal limits  URINALYSIS, ROUTINE W REFLEX MICROSCOPIC (NOT AT [facility]) - Abnormal; Notable for the following:    APPearance HAZY (*)    Ketones, ur 40 (*)    All other components within normal limits  CBC WITH DIFFERENTIAL/PLATELET  URINE RAPID DRUG SCREEN, HOSP PERFORMED  I-STAT TROPOININ, ED   Imaging Review No results found.  Laboratory and Imaging results were personally reviewed by myself and used in the medical decision making of this patient's treatment and disposition.  EKG Interpretation  EKG Interpretation  Date/Time:  Thursday March 01 2015 20:20:16 EDT Ventricular Rate:  75 PR Interval:  167 QRS Duration: 77 QT Interval:  386 QTC Calculation: 431 R Axis:   77 Text Interpretation:  Sinus rhythm Ventricular premature complex Inferior infarct,  acute (LCx) Minimal ST elevation, anterior leads Lateral leads are also involved No significant change since last tracing except pVC Confirmed by KNAPP  MD-J, JON ([id number]) on 03/01/2015 8:23:25 PM      MDM  Christina Burke is a 54 y.o. female with H&P as above. ED clinical course as follows:  Patient presents with symptoms concerning for presyncope.  Endorses no chest pain, no palpitations, and no SOB.  PMHx remarkable for no CAD but HTN.  Patient endorses no prior MI, no prior Arrhythmia and no blood clots/clotting disorders.  DDx I considered includes neurocardiogenic (vasovagal), arrhythmia, structural heart disease, MI, orthostatic hypotension (from intravascular depletion), electrolyte abnormalities, symptomatic anemia and PE.  VS stable, without evidence of cardiopulmonary instability. Exam without evidence of impaired perfusion or worsening volume overload.  Ascultation of Heart and Lung sounds without concern.  Patient does not  have clinical signs or symptoms of DVT. I do not believe PE is the most likely diagnosis. The patient's heart rate is below 100, the patient has not been immobilized, and the patient has not had surgery in the previous 4 weeks. The patient is not displaying hemoptysis and has not suffered from a malignancy within the last 6 months.  Initial ECG reveals no evidence of STEMI, or new onset arrhythmia. STE is diffuse and reveals no reciprocal changes. ECG obtained in repeat which revealed no interval changes suggestive of progression to ischemia.   Troponin sent, which was negative  Labs reveal no abnormalities that would be the likely culprit of today's symptoms. Hgb/Hct stable. BG and electrolytes within normal limits.  My visualization of Imaging revealed CXR without evidence of acute cardiopulmonary disease.   Patient Orthostatics are normal and ambulation without symptom return.  Patient required no interventions at this time.  Disposition:  Discharge  No further emergency or inpatient evaluation or treatment is indicated at this time. Patient without high risk PMHx requiring further emergency or inpatient evaluation or treatment and therefore is being discharged in stable condition. Patient was given strict return precautions and expresses understanding. All questions and concerns were answered. Follow up arranged per AVS, and patient endorses understanding to do so without fail.   Clinical Impression:  1. Other fatigue   2. Lightheaded    Patient care discussed with Dr. Tomi Bamberger, who oversaw their evaluation & treatment & voiced agreement. House Officer: Voncille Lo, MD, Emergency Medicine.  Voncille Lo, MD 03/04/15 0240  Dorie Rank, MD 03/05/15 [phone]

## 2015-03-30 DIAGNOSIS — E785 Hyperlipidemia, unspecified: Secondary | ICD-10-CM | POA: Insufficient documentation

## 2015-03-30 DIAGNOSIS — F419 Anxiety disorder, unspecified: Secondary | ICD-10-CM | POA: Insufficient documentation

## 2015-03-30 DIAGNOSIS — I10 Essential (primary) hypertension: Secondary | ICD-10-CM | POA: Insufficient documentation

## 2015-04-03 ENCOUNTER — Ambulatory Visit (INDEPENDENT_AMBULATORY_CARE_PROVIDER_SITE_OTHER): Payer: Medicaid Other | Admitting: Cardiovascular Disease

## 2015-04-03 ENCOUNTER — Encounter: Payer: Self-pay | Admitting: Cardiovascular Disease

## 2015-04-03 VITALS — BP 110/66 | HR 71 | Ht 63.5 in | Wt 153.8 lb

## 2015-04-03 DIAGNOSIS — I10 Essential (primary) hypertension: Secondary | ICD-10-CM | POA: Diagnosis not present

## 2015-04-03 DIAGNOSIS — R011 Cardiac murmur, unspecified: Secondary | ICD-10-CM | POA: Diagnosis not present

## 2015-04-03 NOTE — Patient Instructions (Signed)

## 2015-04-03 NOTE — Progress Notes (Signed)
Patient ID: Christina Burke, female   DOB: Apr 25, 1961, 54 y.o.   MRN: [id number]   54 y.o.  seen in ER last week with atypical chest pain. R/O normal ECG and told to f/u with cardiology She has been under some stress with relationships Single parent to two teenagers. Divorced 3 years ago. Drinks on occasion not daily. Non smoker. Had positional left and right sided pain No pleurisy. No associated PND, orhtopnea or syncope Gets palpitations at times but nothing sustained. Long standing history of murmur Indicates murmur with [place] 20 years ago during her first pregnancy but no f/u. No history of SBE or congenital disease Chest pain not returned since ER d/c   01/18/14   ETT abnormal  With Exercise: significant ischemic ST depression; > 48mm  horizontal ST depression is seen in the inferior leads and > 1 mm horizontal depression is seen in V4-V6  Other Information Arrhythmia: No Angina during ETT: absent (0) Quality of ETT: diagnostic  ETT Interpretation: abnormal - evidence of ST depression  consistent with ischemia  Comments: Good exercise tolerance. Normotensive response to exercise. Duke score -14  Recommendations: Further evaluation for coronary disease is probably indicated.  Long discussion with patient  Options including cardiac CT, myovue and cath discussed she prefers non invasive evaluation first   01/30/14 had normal cardiac CT Calcium score 0 and normal right dominant coronary arteries  Seen in ER 11/3 with pre syncope in setting of heat, standing diarrhea and taking BP meds.  ER w/u negative and d/c off BP meds Note on low carb diet she has lost 60 lbs    ROS: Denies fever, malais, weight loss, blurry vision, decreased visual acuity, cough, sputum, SOB, hemoptysis, pleuritic pain, palpitaitons, heartburn, abdominal pain, melena, lower extremity edema, claudication, or rash.  All other systems reviewed and negative  General: Affect appropriate Healthy:  appears stated  age 49: normal Neck supple with no adenopathy JVP normal no bruits no thyromegaly Lungs clear with no wheezing and good diaphragmatic motion Heart:  S1/S2 no murmur, no rub, gallop or click PMI normal Abdomen: benighn, BS positve, no tenderness, no AAA no bruit.  No HSM or HJR Distal pulses intact with no bruits No edema Neuro non-focal Skin warm and dry No muscular weakness   Current Outpatient Prescriptions  Medication Sig Dispense Refill  . clonazePAM (KLONOPIN) 1 MG tablet Take 1 mg by mouth 2 (two) times daily.    . nitroGLYCERIN (NITROSTAT) 0.4 MG SL tablet Place 1 tablet (0.4 mg total) under the tongue every 5 (five) minutes as needed. 25 tablet 3  . omeprazole (PRILOSEC) 40 MG capsule Take 1 capsule (40 mg total) by mouth daily. 30 capsule 3   No current facility-administered medications for this visit.    Allergies  Amoxicillin   Electrocardiogram:  September 2015  SR rate 72 normal   03/02/15  SR PAC  J point elevation inferolateral leads no other signs pericarditis   Assessment and Plan  Chest Pain : resolved normal cardiac CT 12/2013  observe GERD:  Doing better on low carb diet with weight loss continue prilosec Pre syncope:  Likely related to heat and dehydration with weight loss and diarrhea.  Stay off BP meds for now      [facility]

## 2015-04-27 ENCOUNTER — Encounter (HOSPITAL_COMMUNITY): Payer: Self-pay | Admitting: Emergency Medicine

## 2015-04-27 ENCOUNTER — Emergency Department (INDEPENDENT_AMBULATORY_CARE_PROVIDER_SITE_OTHER)
Admission: EM | Admit: 2015-04-27 | Discharge: 2015-04-27 | Disposition: A | Discharge status: Home / Self Care | Payer: PRIVATE HEALTH INSURANCE | Source: Home / Self Care | Attending: Family Medicine | Admitting: Family Medicine

## 2015-04-27 DIAGNOSIS — T148 Other injury of unspecified body region: Secondary | ICD-10-CM

## 2015-04-27 DIAGNOSIS — T148XXA Other injury of unspecified body region, initial encounter: Secondary | ICD-10-CM

## 2015-04-27 DIAGNOSIS — M545 Low back pain, unspecified: Secondary | ICD-10-CM

## 2015-04-27 MED ORDER — DICLOFENAC SODIUM 1 % TD GEL: 1.0000 "application " | Freq: Four times a day (QID) | TRANSDERMAL | Status: DC

## 2015-04-27 MED ORDER — NAPROXEN 375 MG PO TABS: 375.0000 mg | ORAL_TABLET | Freq: Two times a day (BID) | ORAL | Status: DC
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
## 2015-10-08 ENCOUNTER — Emergency Department (HOSPITAL_COMMUNITY): Payer: Medicaid Other

## 2015-10-08 ENCOUNTER — Encounter (HOSPITAL_COMMUNITY): Payer: Self-pay | Admitting: *Deleted

## 2015-10-08 ENCOUNTER — Emergency Department (HOSPITAL_COMMUNITY)
Admission: EM | Admit: 2015-10-08 | Discharge: 2015-10-08 | Disposition: A | Discharge status: Home / Self Care | Payer: Medicaid Other | Attending: Emergency Medicine | Admitting: Emergency Medicine

## 2015-10-08 DIAGNOSIS — Z79899 Other long term (current) drug therapy: Secondary | ICD-10-CM | POA: Insufficient documentation

## 2015-10-08 DIAGNOSIS — E785 Hyperlipidemia, unspecified: Secondary | ICD-10-CM | POA: Insufficient documentation

## 2015-10-08 DIAGNOSIS — I10 Essential (primary) hypertension: Secondary | ICD-10-CM | POA: Insufficient documentation

## 2015-10-08 DIAGNOSIS — I493 Ventricular premature depolarization: Secondary | ICD-10-CM | POA: Insufficient documentation

## 2015-10-08 DIAGNOSIS — R002 Palpitations: Secondary | ICD-10-CM

## 2015-10-08 LAB — I-STAT TROPONIN, ED: TROPONIN I, POC: 0 ng/mL (ref 0.00–0.08)

## 2015-10-08 LAB — BASIC METABOLIC PANEL
ANION GAP: 7 (ref 5–15)
BUN: 10 mg/dL (ref 6–20)
CALCIUM: 9.9 mg/dL (ref 8.9–10.3)
CO2: 29 mmol/L (ref 22–32)
CREATININE: 0.77 mg/dL (ref 0.44–1.00)
Chloride: 105 mmol/L (ref 101–111)
Glucose, Bld: 110 mg/dL — ABNORMAL HIGH (ref 65–99)
Potassium: 4.4 mmol/L (ref 3.5–5.1)
SODIUM: 141 mmol/L (ref 135–145)

## 2015-10-08 LAB — CBC
HCT: 41.6 % (ref 36.0–46.0)
HEMOGLOBIN: 13.5 g/dL (ref 12.0–15.0)
MCH: 29.4 pg (ref 26.0–34.0)
MCHC: 32.5 g/dL (ref 30.0–36.0)
MCV: 90.6 fL (ref 78.0–100.0)
PLATELETS: 218 10*3/uL (ref 150–400)
RBC: 4.59 MIL/uL (ref 3.87–5.11)
RDW: 13 % (ref 11.5–15.5)
WBC: 4.7 10*3/uL (ref 4.0–10.5)

## 2015-10-08 IMAGING — CR DG CHEST 2V
2 series · 2 of 2 positions shown · non-contrast
Comparison: Chest radiograph dated [DATE]

CLINICAL DATA: 54-year-old female with palpitations.

EXAM:
CHEST  2 VIEW

[chest pa]
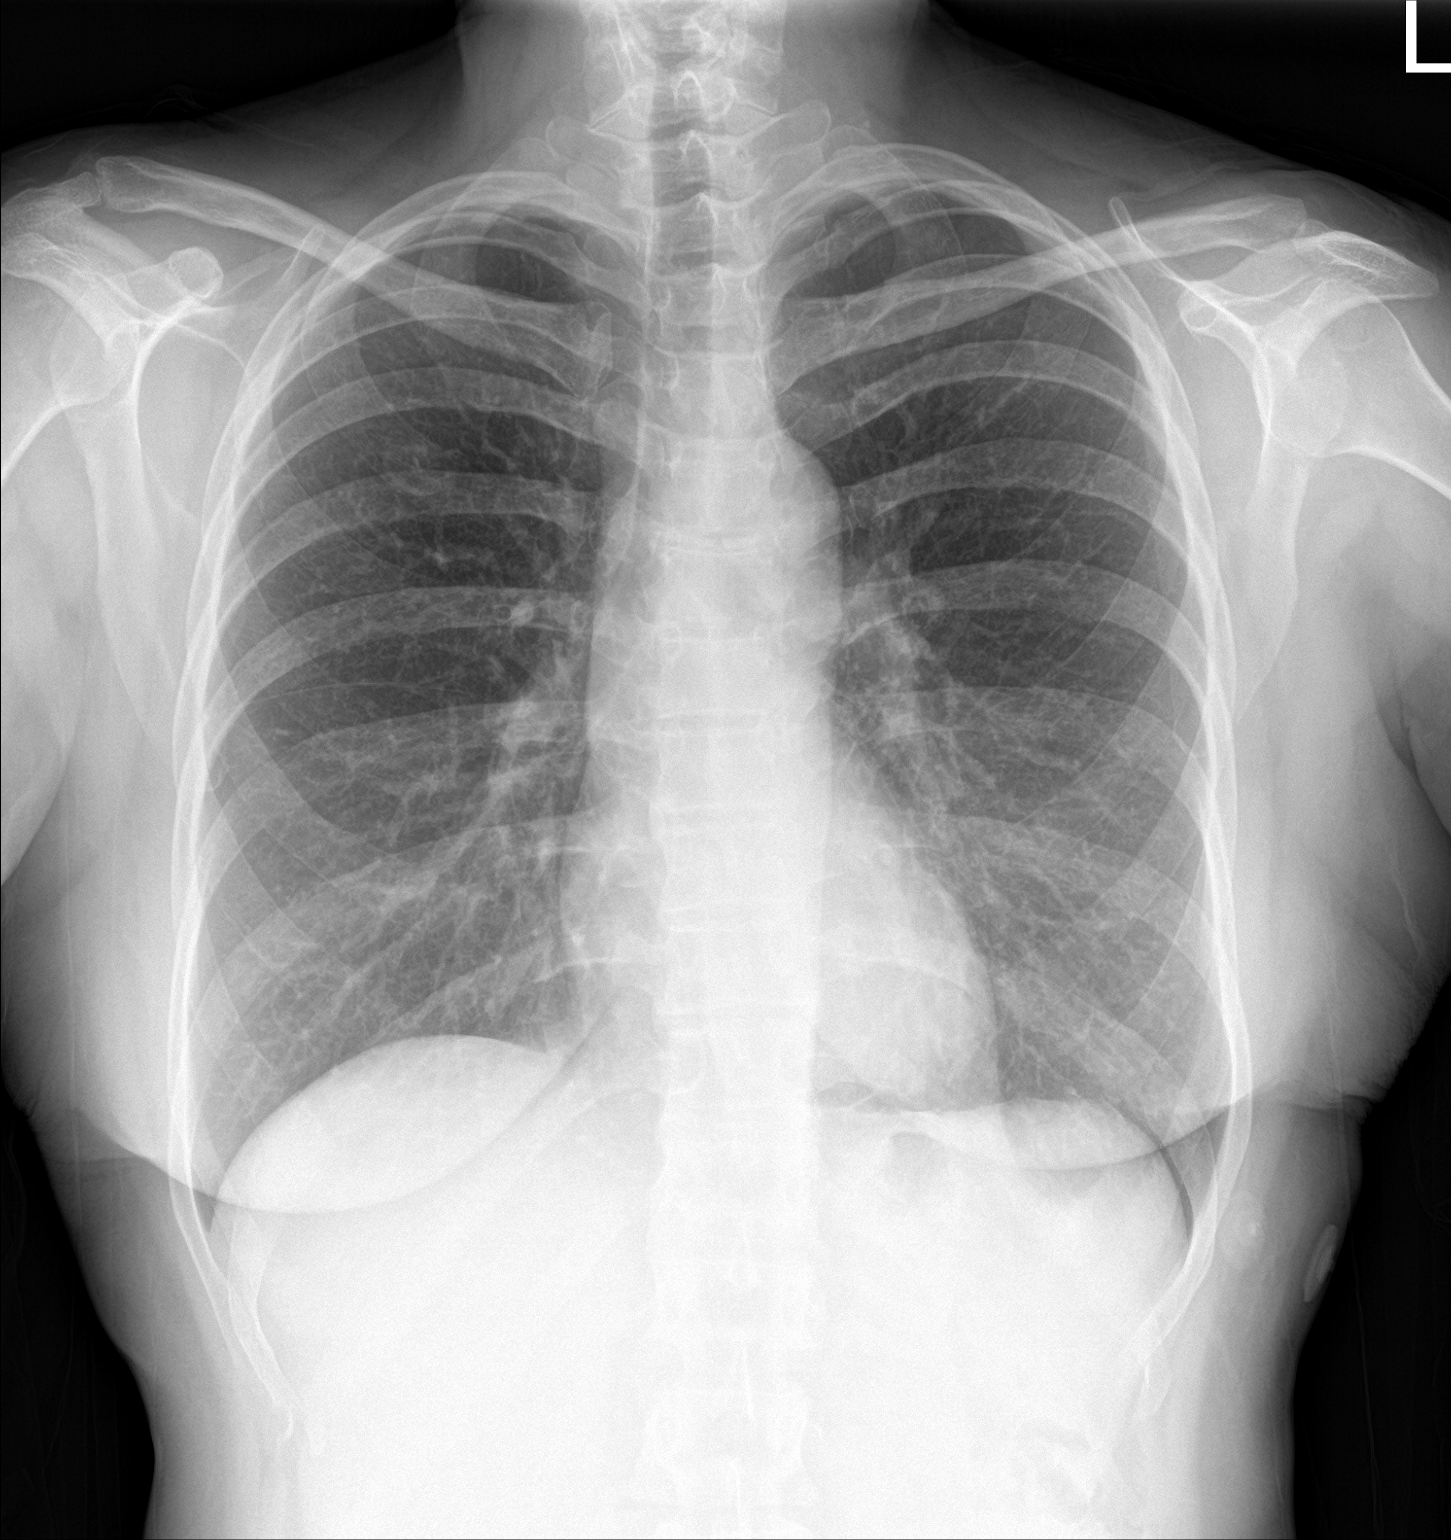

[chest lat]
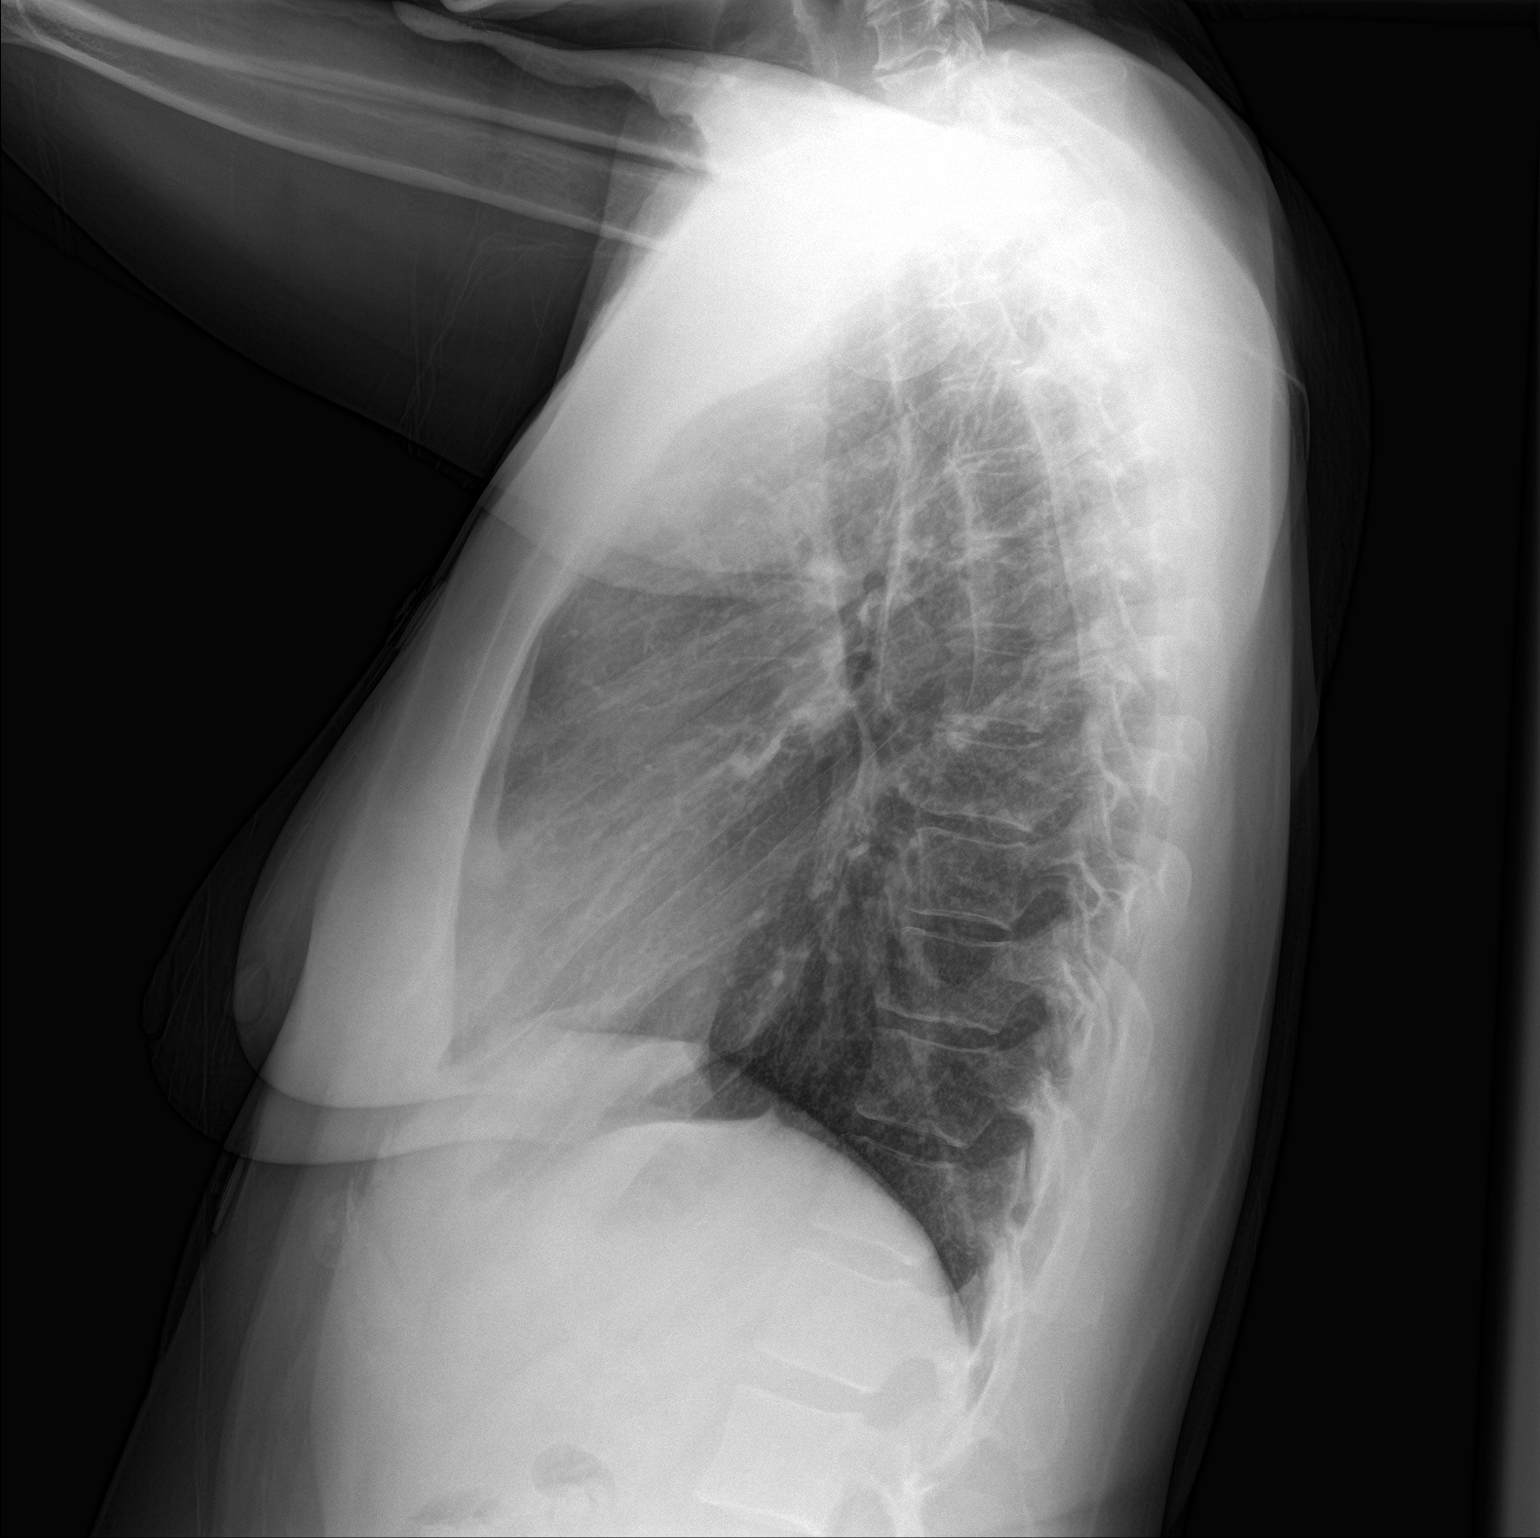

[2 of 2 positions shown; findings below may reference images not displayed]

FINDINGS: The heart size and mediastinal contours are within normal limits.
Both lungs are clear. The visualized skeletal structures are
unremarkable.
IMPRESSION: No active cardiopulmonary disease.

## 2015-10-08 NOTE — ED Notes (Signed)
Pt states hx of heart palpitations and heart murmur.  States the past several months symptoms and been increasing and the last week she has been experiencing sob.  Denies chest pain/tightness/pressure. States lump in throat.

## 2015-10-08 NOTE — ED Provider Notes (Signed)
CSN: [id number]     Arrival date & time 10/08/15  [id number] History   First MD Initiated Contact with Patient 10/08/15 1417     Chief Complaint  Patient presents with  . Palpitations  . Shortness of Breath     (Consider location/radiation/quality/duration/timing/severity/associated sxs/prior Treatment) HPI Jaree Nasir is a 55 y.o. female with PMH significant for HTN, HLD, GERD, anxiety who presents with "a couple months" hx of gradual onset, intermittent, palpitations she describes as "my heart is out of rhythm" that will last a couple of hours.  Aggravating factors include heat.  No modifying factors.  Associated symptoms include SOB at the time of palpitations.  No CP, chest tightness, fever, chills, cough, DOE, syncope, presyncope, dizziness, or lightheadedness.  No hx of DVT/PE.  No exogenous estrogen use.  No recent surgery/immobilization.  Cardiologist: Johnsie Cancel PCP: [facility]  Past Medical History  Diagnosis Date  . Hypertension   . Anxiety   . Hyperlipidemia   . GERD (gastroesophageal reflux disease) 08/10/2014  . Chest pain 12/08/2013  . Murmur 12/08/2013   Past Surgical History  Procedure Laterality Date  . Abdominal surgery    . Abdominal hysterectomy     Family History  Problem Relation Age of Onset  . Colon cancer Neg Hx   . Kidney disease Neg Hx   . Esophageal cancer Neg Hx   . Colon polyps Sister   . Diabetes Sister     x3  . Diabetes Brother   . Gallbladder disease Mother   . Heart disease Father    Social History  Substance Use Topics  . Smoking status: Never Smoker   . Smokeless tobacco: Never Used  . Alcohol Use: 0.0 oz/week    0 Standard drinks or equivalent per week     Comment: occsional   OB History    No data available     Review of Systems All other systems negative unless otherwise stated in HPI    Allergies  Amoxicillin  Home Medications   Prior to Admission medications   Medication Sig Start Date End Date Taking?  Authorizing Provider  clonazePAM (KLONOPIN) 1 MG tablet Take 1 mg by mouth 2 (two) times daily.   Yes Historical Provider, MD  nitroGLYCERIN (NITROSTAT) 0.4 MG SL tablet Place 1 tablet (0.4 mg total) under the tongue every 5 (five) minutes as needed. 01/25/14  Yes Josue Hector, MD  diclofenac sodium (VOLTAREN) 1 % GEL Apply 1 application topically 4 (four) times daily. Patient not taking: Reported on 10/08/2015 04/27/15   Janne Napoleon, NP  naproxen (NAPROSYN) 375 MG tablet Take 1 tablet (375 mg total) by mouth 2 (two) times daily. Prn pain Patient not taking: Reported on 10/08/2015 04/27/15   Janne Napoleon, NP  omeprazole (PRILOSEC) 40 MG capsule Take 1 capsule (40 mg total) by mouth daily. Patient not taking: Reported on 10/08/2015 08/23/14   Inda Castle, MD   BP 112/75 mmHg  Pulse 82  Temp(Src) 97.7 F (36.5 C) (Oral)  Resp 20  Ht 5' 3.5" (1.613 m)  Wt 73.936 kg  BMI 28.42 kg/m2  SpO2 100% Physical Exam  Constitutional: She is oriented to person, place, and time. She appears well-developed and well-nourished.  Non-toxic appearance. She does not have a sickly appearance. She does not appear ill.  HENT:  Head: Normocephalic and atraumatic.  Mouth/Throat: Oropharynx is clear and moist.  Eyes: Conjunctivae are normal. Pupils are equal, round, and reactive to light.  Neck: Normal range of motion.  Neck supple.  Cardiovascular: Normal rate, regular rhythm and normal heart sounds.   No murmur heard. No unilateral leg swelling.   Pulmonary/Chest: Effort normal and breath sounds normal. No accessory muscle usage or stridor. No respiratory distress. She has no wheezes. She has no rhonchi. She has no rales.  Abdominal: Soft. Bowel sounds are normal. She exhibits no distension. There is no tenderness.  Musculoskeletal: Normal range of motion.  Lymphadenopathy:    She has no cervical adenopathy.  Neurological: She is alert and oriented to person, place, and time.  Speech clear without dysarthria.   Skin: Skin is warm and dry.  Psychiatric: She has a normal mood and affect. Her behavior is normal.    ED Course  Procedures (including critical care time) Labs Review Labs Reviewed  BASIC METABOLIC PANEL - Abnormal; Notable for the following:    Glucose, Bld 110 (*)    All other components within normal limits  CBC  I-STAT TROPOININ, ED    Imaging Review Dg Chest 2 View  10/08/2015  CLINICAL DATA:  56 year old female with palpitations. EXAM: CHEST  2 VIEW COMPARISON:  Chest radiograph dated 03/01/2015 FINDINGS: The heart size and mediastinal contours are within normal limits. Both lungs are clear. The visualized skeletal structures are unremarkable. IMPRESSION: No active cardiopulmonary disease. Electronically Signed   By: Anner Crete M.D.   On: 10/08/2015 10:08   I have personally reviewed and evaluated these images and lab results as part of my medical decision-making.   EKG Interpretation   Date/Time:  Monday October 08 2015 09:16:56 EDT Ventricular Rate:  65 PR Interval:  160 QRS Duration: 88 QT Interval:  382 QTC Calculation: 397 R Axis:   69 Text Interpretation:  Sinus rhythm with occasional Premature ventricular  complexes and Premature atrial complexes Otherwise normal ECG No  significant change since last tracing Confirmed by ALLEN  MD, ANTHONY  ([id number]) on 10/08/2015 2:27:35 PM      MDM   Final diagnoses:  Intermittent palpitations  PVC (premature ventricular contraction)   Patient presents with intermittent palpitations and SOB over the last couple of months.  No syncope, dizziness, presyncope, or CP.  Cardiologist Dr. Johnsie Cancel.  EKG without acute changes, occasional PVC and PACs.  Troponin x 1 normal.  Labs without acute abnormalities.  CXR negative.  VSS, NAD.  On exam, patient appears well, non-toxic or septic appearing.  Heart RRR, lungs CTAB, abdomen soft and benign.  No unilateral lower extremity edema.  Low risk PE with Well's Criteria.  HEART score 2,  doubt ACS.  Plan to follow up Dr. Johnsie Cancel.  Discussed return precautions.  Patient agrees and acknowledges the above plan for discharge.  Case has been discussed with and seen by Dr. Zenia Resides who agrees with the above plan for discharge.     Gloriann Loan, PA-C 10/08/15 1446

## 2015-10-08 NOTE — Discharge Instructions (Signed)
Palpitations A palpitation is the feeling that your heartbeat is irregular or is faster than normal. It may feel like your heart is fluttering or skipping a beat. Palpitations are usually not a serious problem. However, in some cases, you may need further medical evaluation. CAUSES  Palpitations can be caused by:  Smoking.  Caffeine or other stimulants, such as diet pills or energy drinks.  Alcohol.  Stress and anxiety.  Strenuous physical activity.  Fatigue.  Certain medicines.  Heart disease, especially if you have a history of irregular heart rhythms (arrhythmias), such as atrial fibrillation, atrial flutter, or supraventricular tachycardia.  An improperly working pacemaker or defibrillator. DIAGNOSIS  To find the cause of your palpitations, your health care provider will take your medical history and perform a physical exam. Your health care provider may also have you take a test called an ambulatory electrocardiogram (ECG). An ECG records your heartbeat patterns over a 24-hour period. You may also have other tests, such as:  Transthoracic echocardiogram (TTE). During echocardiography, sound waves are used to evaluate how blood flows through your heart.  Transesophageal echocardiogram (TEE).  Cardiac monitoring. This allows your health care provider to monitor your heart rate and rhythm in real time.  Holter monitor. This is a portable device that records your heartbeat and can help diagnose heart arrhythmias. It allows your health care provider to track your heart activity for several days, if needed.  Stress tests by exercise or by giving medicine that makes the heart beat faster. TREATMENT  Treatment of palpitations depends on the cause of your symptoms and can vary greatly. Most cases of palpitations do not require any treatment other than time, relaxation, and monitoring your symptoms. Other causes, such as atrial fibrillation, atrial flutter, or supraventricular  tachycardia, usually require further treatment. HOME CARE INSTRUCTIONS   Avoid:  Caffeinated coffee, tea, soft drinks, diet pills, and energy drinks.  Chocolate.  Alcohol.  Stop smoking if you smoke.  Reduce your stress and anxiety. Things that can help you relax include:  A method of controlling things in your body, such as your heartbeats, with your mind (biofeedback).  Yoga.  Meditation.  Physical activity such as swimming, jogging, or walking.  Get plenty of rest and sleep. SEEK MEDICAL CARE IF:   You continue to have a fast or irregular heartbeat beyond 24 hours.  Your palpitations occur more often. SEEK IMMEDIATE MEDICAL CARE IF:  You have chest pain or shortness of breath.  You have a severe headache.  You feel dizzy or you faint. MAKE SURE YOU:  Understand these instructions.  Will watch your condition.  Will get help right away if you are not doing well or get worse.   This information is not intended to replace advice given to you by your health care provider. Make sure you discuss any questions you have with your health care provider.   Document Released: 04/11/2000 Document Revised: 04/19/2013 Document Reviewed: 06/13/2011 Elsevier Interactive Patient Education 2016 [employer]  Premature Ventricular Contraction A premature ventricular contraction is an irregularity in the normal heart rhythm. These contractions are extra heartbeats that occur too early in the normal sequence. In most cases, these contractions are harmless and do not require treatment. CAUSES Premature ventricular contractions may occur without a known cause. In healthy people, the extra contractions may be caused by:  Smoking.  Drinking alcohol.  Caffeine.  Certain medicines.  Some illegal drugs.  Stress. Sometimes, changes in chemicals in the blood (electrolytes) can also cause premature  ventricular contractions. They can also occur in people with heart diseases that  cause a decrease in blood flow to the heart. SIGNS AND SYMPTOMS Premature ventricular contractions often do not cause any symptoms. In some cases, you may have a feeling of your heart beating fast or skipping a beat (palpitations). DIAGNOSIS Your health care provider will take your medical history and do a physical exam. During the exam, the health care provider will check for irregular heartbeats. Various tests may be done to help diagnose premature ventricular contractions. These tests may include:  An ECG (electrocardiogram) to monitor the electrical activity of your heart.  Holter monitor testing. A Holter monitor is a portable device that can monitor the electrical activity of your heart over longer periods of time.  Stress tests to see how exercise affects your heart rhythm.  Echocardiogram. This test uses sound waves (ultrasound) to produce an image of your heart.  Electrophysiology study. This is used to evaluate the electrical conduction system of your heart. TREATMENT Usually, no treatment is needed. You may be advised to avoid things that can trigger the premature contractions, such as caffeine or alcohol. Medicines are sometimes given if symptoms are severe or if the extra heartbeats are very frequent. Treatment may also be needed for an underlying cause of the contractions if one is found. HOME CARE INSTRUCTIONS  Take medicines only as directed by your health care provider.  Make any lifestyle changes recommended by your health care provider. These may include:  Quitting smoking.  Avoiding or limiting caffeine or alcohol.  Exercising. Talk to your health care provider about what type of exercise is safe for you.  Trying to reduce stress.  Keep all follow-up visits with your health care provider. This is important. SEEK IMMEDIATE MEDICAL CARE IF:  You feel palpitations that are frequent or continual.  You have chest pain.  You have shortness of breath.  You have  sweating for no reason.  You have nausea and vomiting.  You become light-headed or faint.   This information is not intended to replace advice given to you by your health care provider. Make sure you discuss any questions you have with your health care provider.   Document Released: 11/30/2003 Document Revised: 05/05/2014 Document Reviewed: 09/15/2013 Elsevier Interactive Patient Education [employer].

## 2015-10-08 NOTE — ED Provider Notes (Signed)
Medical screening examination/treatment/procedure(s) were conducted as a shared visit with non-physician practitioner(s) and myself.  I personally evaluated the patient during the encounter.   EKG Interpretation   Date/Time:  Monday October 08 2015 09:16:56 EDT Ventricular Rate:  65 PR Interval:  160 QRS Duration: 88 QT Interval:  382 QTC Calculation: 397 R Axis:   69 Text Interpretation:  Sinus rhythm with occasional Premature ventricular  complexes and Premature atrial complexes Otherwise normal ECG No  significant change since last tracing Confirmed by Quinten Allerton  MD, Haiden Clucas  ([id number]) on 10/08/2015 2:27:35 PM     Patient here complaining of palpitations for several months without associated syncope or dizziness. Labs and EKG and chest x-ray are reassuring. Patient will follow-up with her cardiologist  Lacretia Leigh, MD 10/08/15 1439

## 2015-10-15 ENCOUNTER — Emergency Department (HOSPITAL_COMMUNITY)
Admission: EM | Admit: 2015-10-15 | Discharge: 2015-10-15 | Disposition: A | Discharge status: Home / Self Care | Payer: Medicaid Other | Attending: Emergency Medicine | Admitting: Emergency Medicine

## 2015-10-15 ENCOUNTER — Encounter (HOSPITAL_COMMUNITY): Payer: Self-pay | Admitting: Emergency Medicine

## 2015-10-15 DIAGNOSIS — R300 Dysuria: Secondary | ICD-10-CM | POA: Diagnosis present

## 2015-10-15 DIAGNOSIS — Z79899 Other long term (current) drug therapy: Secondary | ICD-10-CM | POA: Insufficient documentation

## 2015-10-15 DIAGNOSIS — E785 Hyperlipidemia, unspecified: Secondary | ICD-10-CM | POA: Diagnosis not present

## 2015-10-15 DIAGNOSIS — I10 Essential (primary) hypertension: Secondary | ICD-10-CM | POA: Diagnosis not present

## 2015-10-15 LAB — URINALYSIS, ROUTINE W REFLEX MICROSCOPIC
Bilirubin Urine: NEGATIVE
Glucose, UA: NEGATIVE mg/dL
Hgb urine dipstick: NEGATIVE
KETONES UR: NEGATIVE mg/dL
LEUKOCYTES UA: NEGATIVE
NITRITE: NEGATIVE
PROTEIN: NEGATIVE mg/dL
Specific Gravity, Urine: 1.03 (ref 1.005–1.030)
pH: 5.5 (ref 5.0–8.0)

## 2015-10-15 MED ORDER — PHENAZOPYRIDINE HCL 200 MG PO TABS: 200.0000 mg | ORAL_TABLET | Freq: Three times a day (TID) | ORAL | Status: DC

## 2015-10-15 NOTE — Discharge Instructions (Signed)
Your urine does NOT appear infected. I am discharging you with medication that may help  With your urinary discomfort. Please make an appointment at the [facility] at [facility] or with your primary care doctor or your Gynecologist.  BE AWARE THAT THIS MEDICATION CAN TURN YOUR URINE ORANGE AND STAIN YOUR UNDERWEAR.    Dysuria Dysuria is pain or discomfort while urinating. The pain or discomfort may be felt in the tube that carries urine out of the bladder (urethra) or in the surrounding tissue of the genitals. The pain may also be felt in the groin area, lower abdomen, and lower back. You may have to urinate frequently or have the sudden feeling that you have to urinate (urgency). Dysuria can affect both men and women, but is more common in women. Dysuria can be caused by many different things, including:  Urinary tract infection in women.  Infection of the kidney or bladder.  Kidney stones or bladder stones.  Certain sexually transmitted infections (STIs), such as chlamydia.  Dehydration.  Inflammation of the vagina.  Use of certain medicines.  Use of certain soaps or scented products that cause irritation. HOME CARE INSTRUCTIONS Watch your dysuria for any changes. The following actions may help to reduce any discomfort you are feeling:  Drink enough fluid to keep your urine clear or pale yellow.  Empty your bladder often. Avoid holding urine for long periods of time.  After a bowel movement or urination, women should cleanse from front to back, using each tissue only once.  Empty your bladder after sexual intercourse.  Take medicines only as directed by your health care provider.  If you were prescribed an antibiotic medicine, finish it all even if you start to feel better.  Avoid caffeine, tea, and alcohol. They can irritate the bladder and make dysuria worse. In men, alcohol may irritate the prostate.  Keep all follow-up visits as directed by your  health care provider. This is important.  If you had any tests done to find the cause of dysuria, it is your responsibility to obtain your test results. Ask the lab or department performing the test when and how you will get your results. Talk with your health care provider if you have any questions about your results. SEEK MEDICAL CARE IF:  You develop pain in your back or sides.  You have a fever.  You have nausea or vomiting.  You have blood in your urine.  You are not urinating as often as you usually do. SEEK IMMEDIATE MEDICAL CARE IF:  You pain is severe and not relieved with medicines.  You are unable to hold down any fluids.  You or someone else notices a change in your mental function.  You have a rapid heartbeat at rest.  You have shaking or chills.  You feel extremely weak.   This information is not intended to replace advice given to you by your health care provider. Make sure you discuss any questions you have with your health care provider.   Document Released: 01/11/2004 Document Revised: 05/05/2014 Document Reviewed: 12/08/2013 Elsevier Interactive Patient Education 2016 Elsevier Inc.  Urodynamic Testing WHAT IS URODYNAMIC TESTING? Urodynamic tests are done to determine how well your lower urinary tract is working. The lower urinary tract includes your bladder and the tube that empties your bladder (urethra).  When your kidneys filter your blood, urine is stored in your bladder until you feel the urge to pass urine (urinate). Urination requires coordination between the nerves and  muscles of your bladder and urethra. When your lower urinary tract is working well, you should be able to:   Start urinating when your bladder is full.  Empty your bladder completely.  Control the flow of your urine. WHY DO I NEED URODYNAMIC TESTING? You may need urodynamic testing if you:  Are leaking urine (incontinence).  Have problems starting or stopping your urine  flow.  Have frequent or painful urination.  Have frequent urinary tract infections.  Cannot empty your bladder completely.  Have strong urges to pass urine (urgency).  Have a weak flow of urine. HOW IS URODYNAMIC TESTING DONE? Urodynamic tests may be done separately or all during one testing visit. These tests may be done at your health care provider's office, a clinic, or a hospital. You may be given an antibiotic medicine before or after testing to prevent infection. Ask your health care provider if you should:  Stop taking any of your regular medicines.  Arrive for the test with a full bladder. The urodynamic tests you may have done include: Uroflowmetry This test measures how much urine you pass and how long it takes to pass.  You sit on a special toilet to urinate.  The toilet measures the volume and the time of your urine flow.  These measurements are sent to a computer that creates a graph of your urine flow. Postvoid residual measurement This test measures how much urine is left in your bladder after you urinate.  It uses sound waves (ultrasound) to create an image of your bladder.  The test can also be done by inserting a thin, flexible tube (catheter) into your bladder after you urinate.  Remaining urine is measured in milliliters (mL). If you have more than 100 mL left in your bladder after you urinate, your bladder is not emptying as it should. Cystometric testing This test uses a special bladder catheter that can measure pressure.   A numbing medicine (local anesthetic) may be used.  First, a normal catheter is used to empty your bladder completely.  Then the measuring catheter is placed, and your bladder is filled with warm, germ-free (sterile) water.  Pressure measurements will be taken:  As your bladder fills.  When you feel the need to urinate.  As your bladder is emptied.  You may be asked to cough or bear down to check for leakage.  In some  cases, your bladder may be filled with a material that shows up on X-rays (contrast material) so that X-ray pictures can be taken during the test. Electromyography This test measures the electrical activity of the nerves and muscles of your bladder and the opening of your urethra.   It tells how well your nerves are communicating with your muscles.  Sticky patches are placed near your rectum and urethra to measure electrical activity. WHAT ARE THE RISKS OF THIS TESTING? Generally, these tests are safe. However, problems can occur and include:  Discomfort.  Frequent urge to urinate.  Bleeding.  Infection.  An allergic reaction to contrast material, if contrast material is used. WHAT HAPPENS AFTER THE TESTING?   You should be able to go home right away and do your usual activities.  You may be instructed to drink a tall glass of water every 30 minutes for the first 2 hours you are home.  Taking a warm bath or using a warm compress may relieve any discomfort near your urethra. Let your health care provider know if you have:  Pain.  Blood in  your urine.  Chills.  Fever. WHAT DO MY TEST RESULTS MEAN? Discuss the results of your urodynamic tests with your health care provider. Your health care provider will use the results of these and other tests, along with your signs and symptoms, to make a diagnosis. Some common causes for abnormal results from urodynamic tests include:  Enlarged prostate in men.  Overactive bladder.  Urinary tract infection.  Nervous system diseases.  Spinal cord damage.   This information is not intended to replace advice given to you by your health care provider. Make sure you discuss any questions you have with your health care provider.   Document Released: 02/09/2007 Document Revised: 05/05/2014 Document Reviewed: 07/25/2013 Elsevier Interactive Patient Education [employer].

## 2015-10-15 NOTE — ED Provider Notes (Signed)
CSN: [id number]     Arrival date & time 10/15/15  1521 History   First MD Initiated Contact with Patient 10/15/15 1544     Chief Complaint  Patient presents with  . Dysuria     (Consider location/radiation/quality/duration/timing/severity/associated sxs/prior Treatment) HPI  This is a 55 year old female who presents emergency Department with chief complaint of urinary urgency, and burning. She states that she has had symptoms for about a month. They have been worsening. She has a history of stress incontinence since her previous pregnancies and hysterectomy. She denies vaginal symptoms. She denies hematuria, back pain. She denies fevers, chills, myalgias. Past Medical History  Diagnosis Date  . Hypertension   . Anxiety   . Hyperlipidemia   . GERD (gastroesophageal reflux disease) 08/10/2014  . Chest pain 12/08/2013  . Murmur 12/08/2013   Past Surgical History  Procedure Laterality Date  . Abdominal surgery    . Abdominal hysterectomy     Family History  Problem Relation Age of Onset  . Colon cancer Neg Hx   . Kidney disease Neg Hx   . Esophageal cancer Neg Hx   . Colon polyps Sister   . Diabetes Sister     x3  . Diabetes Brother   . Gallbladder disease Mother   . Heart disease Father    Social History  Substance Use Topics  . Smoking status: Never Smoker   . Smokeless tobacco: Never Used  . Alcohol Use: 0.0 oz/week    0 Standard drinks or equivalent per week     Comment: occsional   OB History    No data available     Review of Systems  Ten systems reviewed and are negative for acute change, except as noted in the HPI.    Allergies  Amoxicillin  Home Medications   Prior to Admission medications   Medication Sig Start Date End Date Taking? Authorizing Provider  clonazePAM (KLONOPIN) 1 MG tablet Take 1 mg by mouth 2 (two) times daily.   Yes Historical Provider, MD  nitroGLYCERIN (NITROSTAT) 0.4 MG SL tablet Place 1 tablet (0.4 mg total) under the tongue  every 5 (five) minutes as needed. 01/25/14  Yes Josue Hector, MD  diclofenac sodium (VOLTAREN) 1 % GEL Apply 1 application topically 4 (four) times daily. Patient not taking: Reported on 10/08/2015 04/27/15   Janne Napoleon, NP  naproxen (NAPROSYN) 375 MG tablet Take 1 tablet (375 mg total) by mouth 2 (two) times daily. Prn pain Patient not taking: Reported on 10/08/2015 04/27/15   Janne Napoleon, NP  omeprazole (PRILOSEC) 40 MG capsule Take 1 capsule (40 mg total) by mouth daily. Patient not taking: Reported on 10/08/2015 08/23/14   Inda Castle, MD  phenazopyridine (PYRIDIUM) 200 MG tablet Take 1 tablet (200 mg total) by mouth 3 (three) times daily. 10/15/15   Javier Mamone, PA-C   BP 146/87 mmHg  Pulse 72  Temp(Src) 98.3 F (36.8 C) (Oral)  Resp 18  SpO2 99% Physical Exam Physical Exam  Nursing note and vitals reviewed. Constitutional: She is oriented to person, place, and time. She appears well-developed and well-nourished. No distress.  HENT:  Head: Normocephalic and atraumatic.  Eyes: Conjunctivae normal and EOM are normal. Pupils are equal, round, and reactive to light. No scleral icterus.  Neck: Normal range of motion.  Cardiovascular: Normal rate, regular rhythm and normal heart sounds.  Exam reveals no gallop and no friction rub.   No murmur heard. Pulmonary/Chest: Effort normal and breath sounds normal. No respiratory  distress.  Abdominal: Soft. Bowel sounds are normal. She exhibits no distension and no mass. There is no tenderness. There is no guarding.  Neurological: She is alert and oriented to person, place, and time.  Skin: Skin is warm and dry. She is not diaphoretic.    ED Course  Procedures (including critical care time) Labs Review Labs Reviewed  URINALYSIS, ROUTINE W REFLEX MICROSCOPIC (NOT AT [facility])    Imaging Review No results found. I have personally reviewed and evaluated these images and lab results as part of my medical decision-making.   EKG  Interpretation None      MDM   Final diagnoses:  Dysuria    Patient with negative urinalysis. Will discharge with pyridium. Patient is advised to follow up with [facility].   Margarita Mail, PA-C 10/15/15 1627  Carmin Muskrat, MD 10/15/15 1800

## 2015-10-15 NOTE — ED Notes (Signed)
Per pt, states burning with urination and frequency for over a month

## 2015-12-28 ENCOUNTER — Other Ambulatory Visit: Payer: Self-pay | Admitting: Obstetrics and Gynecology

## 2015-12-28 DIAGNOSIS — R928 Other abnormal and inconclusive findings on diagnostic imaging of breast: Secondary | ICD-10-CM

## 2016-01-02 ENCOUNTER — Other Ambulatory Visit: Payer: Self-pay | Admitting: Obstetrics and Gynecology

## 2016-01-02 DIAGNOSIS — R928 Other abnormal and inconclusive findings on diagnostic imaging of breast: Secondary | ICD-10-CM

## 2016-01-03 ENCOUNTER — Ambulatory Visit
Admission: RE | Admit: 2016-01-03 | Discharge: 2016-01-03 | Disposition: A | Discharge status: Home / Self Care | Payer: Medicaid Other | Source: Ambulatory Visit | Attending: Obstetrics and Gynecology | Admitting: Obstetrics and Gynecology

## 2016-01-03 DIAGNOSIS — R928 Other abnormal and inconclusive findings on diagnostic imaging of breast: Secondary | ICD-10-CM

## 2016-01-03 IMAGING — MG 2D DIGITAL DIAGNOSTIC UNILATERAL RIGHT MAMMOGRAM WITH CAD AND AD
6 series · 6 of 14 positions shown · non-contrast
Comparison: Previous exam(s).

CLINICAL DATA: Callback from screening mammogram

EXAM:
2D DIGITAL DIAGNOSTIC UNILATERAL RIGHT MAMMOGRAM WITH CAD AND
ADJUNCT TOMO

[R CC]
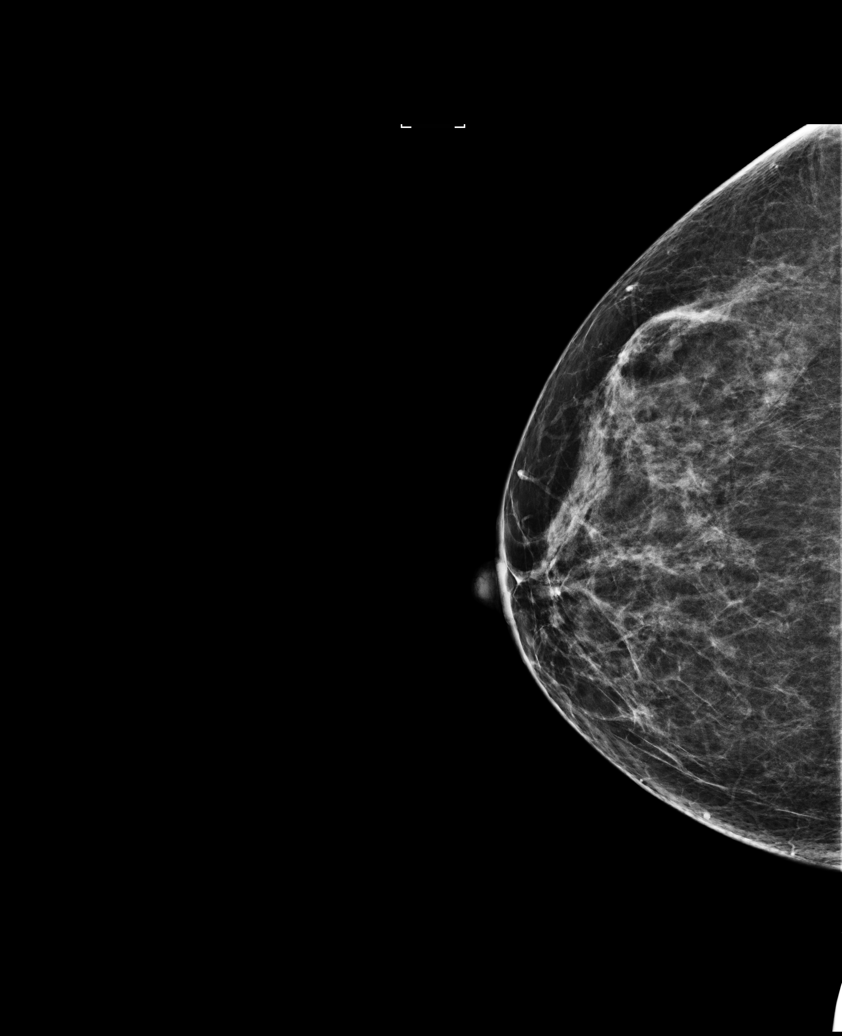

[R CC synth-2D]
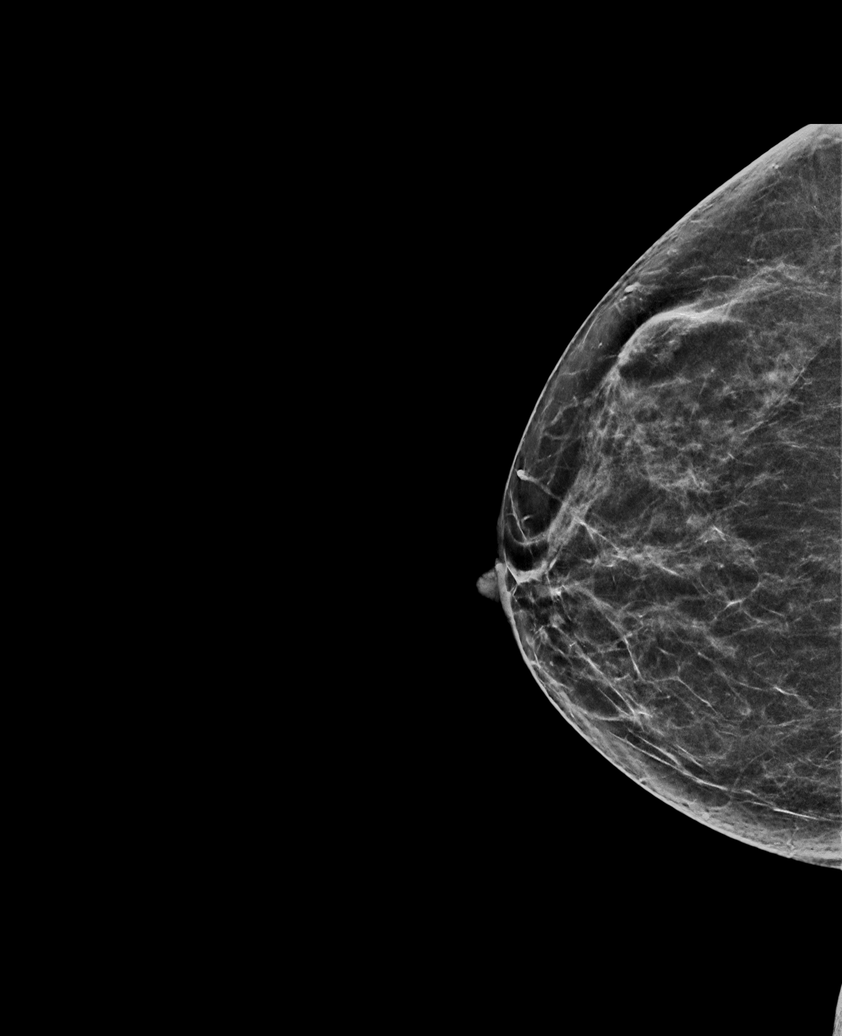

[R MLO synth-2D]
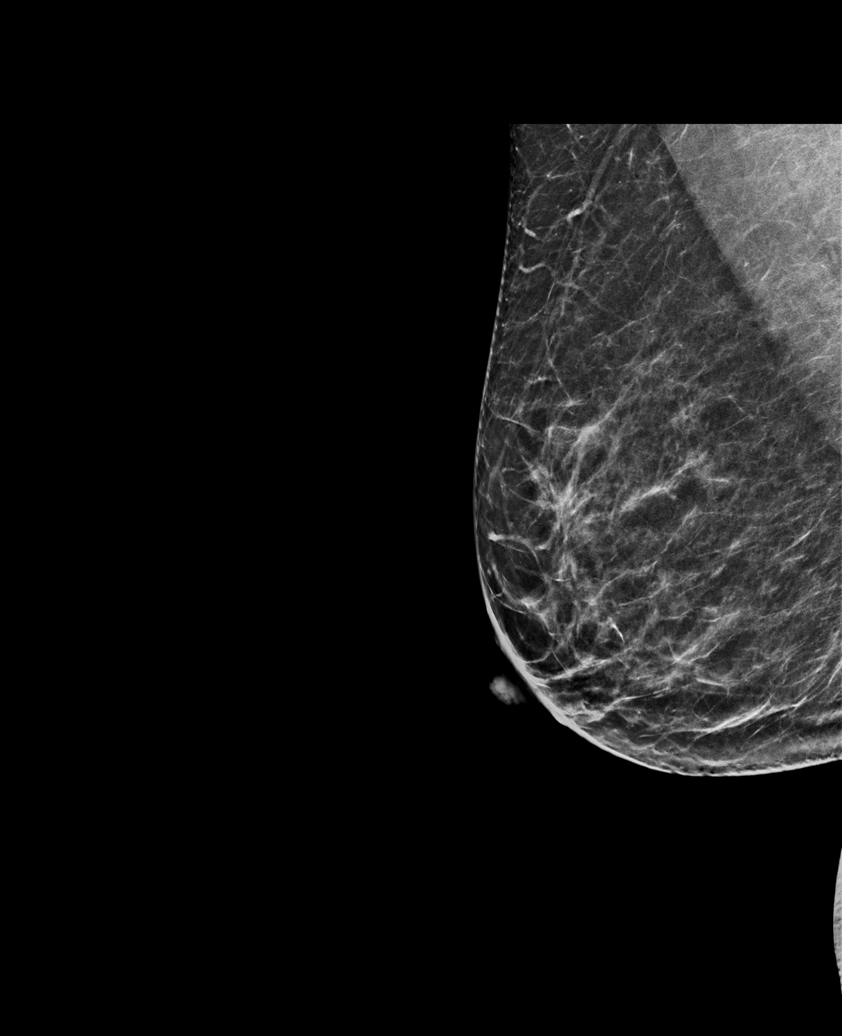

[R MLO]
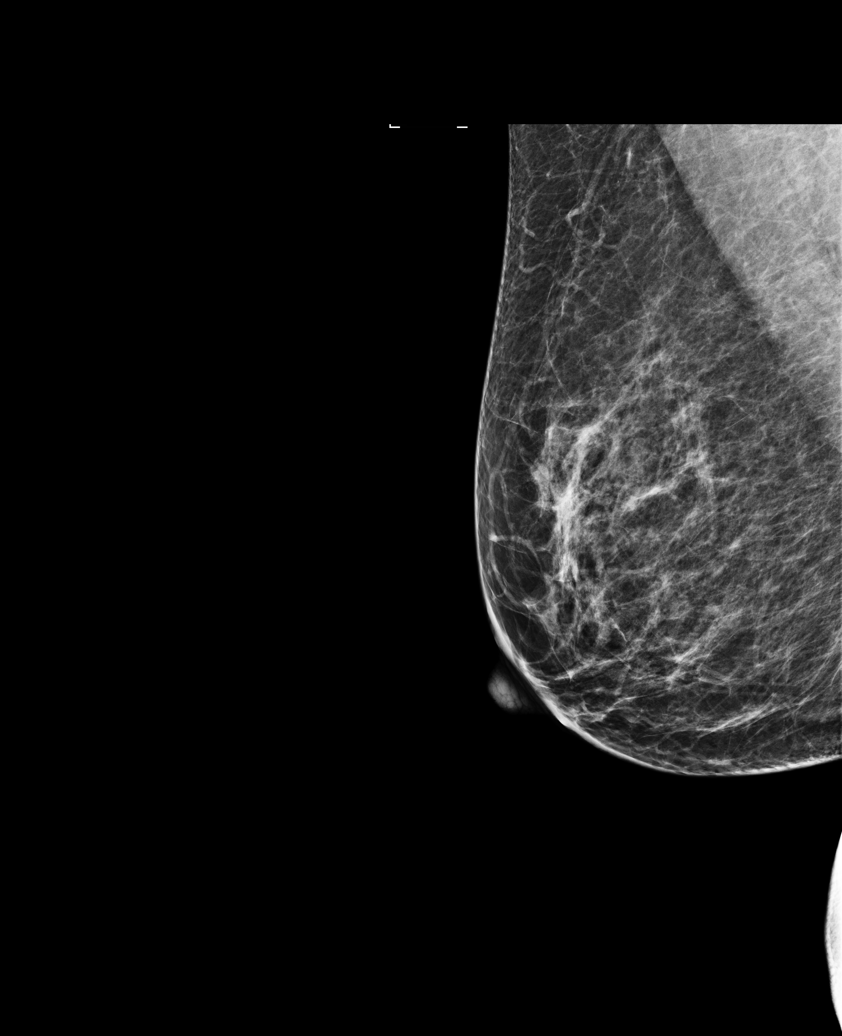

[R MLO tomo · tomo slice 33/65.0]
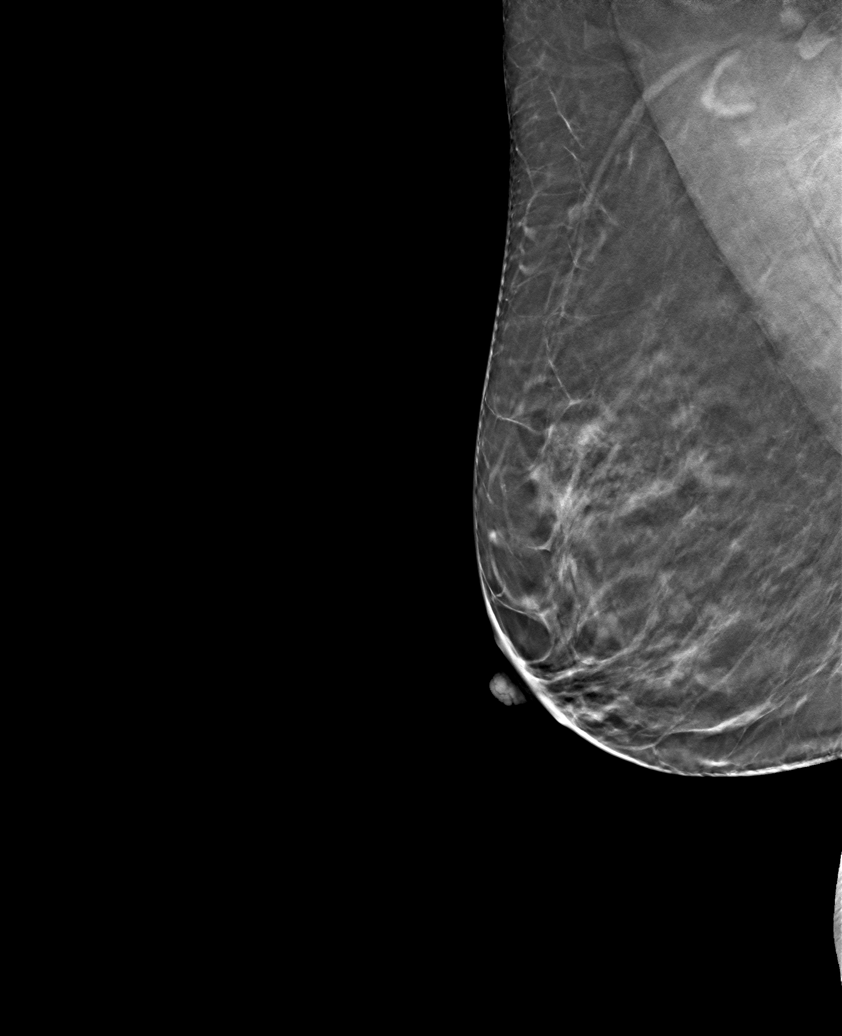

[R CC tomo · tomo slice 31/62.0]
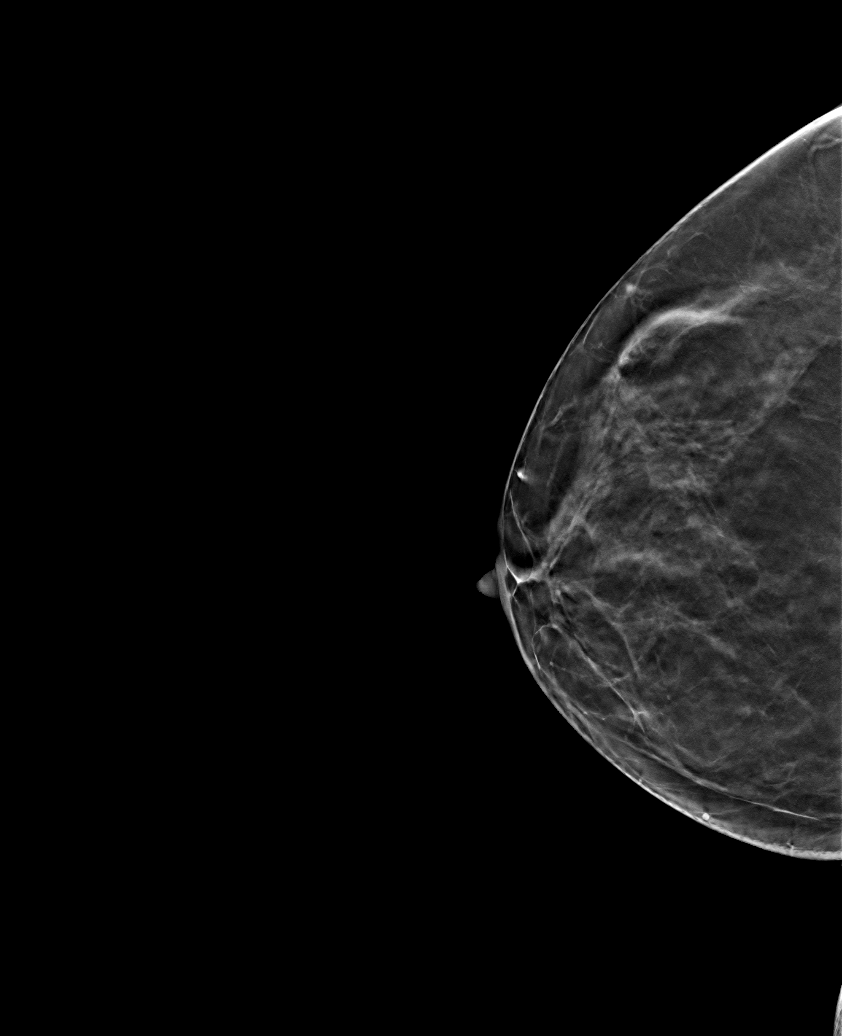

[6 of 14 positions shown; findings below may reference images not displayed]

ACR Breast Density Category b: There are scattered areas of
fibroglandular density.
FINDINGS: Cc and MLO views of the right breast were performed with
tomosynthesis. On the additional views, there is no suspicious mass,
calcifications, or other abnormality in the area of concern seen on
screening mammogram. Two adjacent oval, circumscribed, low-density
masses within the upper, outer right breast are unchanged from [CF]
and consistent with normal intramammary lymph nodes.

Mammographic images were processed with CAD.
IMPRESSION: No mammographic evidence of malignancy.

RECOMMENDATION:
Screening mammogram in one year.(Code:[CF])

I have discussed the findings and recommendations with the patient.
Results were also provided in writing at the conclusion of the
visit. If applicable, a reminder letter will be sent to the patient
regarding the next appointment.

BI-RADS CATEGORY  2: Benign.

## 2016-04-07 ENCOUNTER — Ambulatory Visit: Payer: Medicaid Other | Attending: Family Medicine | Admitting: Family Medicine

## 2016-04-07 ENCOUNTER — Other Ambulatory Visit: Payer: Self-pay

## 2016-04-07 ENCOUNTER — Encounter: Payer: Self-pay | Admitting: Family Medicine

## 2016-04-07 VITALS — BP 133/83 | HR 74 | Temp 98.1°F | Ht 63.5 in | Wt 200.0 lb

## 2016-04-07 DIAGNOSIS — F419 Anxiety disorder, unspecified: Secondary | ICD-10-CM | POA: Diagnosis not present

## 2016-04-07 DIAGNOSIS — Z8249 Family history of ischemic heart disease and other diseases of the circulatory system: Secondary | ICD-10-CM | POA: Insufficient documentation

## 2016-04-07 DIAGNOSIS — Z880 Allergy status to penicillin: Secondary | ICD-10-CM | POA: Insufficient documentation

## 2016-04-07 DIAGNOSIS — I10 Essential (primary) hypertension: Secondary | ICD-10-CM | POA: Insufficient documentation

## 2016-04-07 DIAGNOSIS — Z79899 Other long term (current) drug therapy: Secondary | ICD-10-CM | POA: Insufficient documentation

## 2016-04-07 DIAGNOSIS — E785 Hyperlipidemia, unspecified: Secondary | ICD-10-CM | POA: Insufficient documentation

## 2016-04-07 DIAGNOSIS — Z9889 Other specified postprocedural states: Secondary | ICD-10-CM | POA: Insufficient documentation

## 2016-04-07 DIAGNOSIS — I493 Ventricular premature depolarization: Secondary | ICD-10-CM | POA: Insufficient documentation

## 2016-04-07 DIAGNOSIS — K219 Gastro-esophageal reflux disease without esophagitis: Secondary | ICD-10-CM | POA: Insufficient documentation

## 2016-04-07 MED ORDER — BENAZEPRIL HCL 20 MG PO TABS: 20.0000 mg | ORAL_TABLET | Freq: Every day | ORAL | 3 refills | Status: DC

## 2016-04-07 MED FILL — BENAZEPRIL HCL 20 MG TABLET: 20 | 30 days supply | Qty: 30 | Fill #0

## 2016-04-07 NOTE — Progress Notes (Signed)
Subjective:  Patient ID: Christina Burke, female    DOB: 1960-06-21  Age: 55 y.o. MRN: [id number]  CC: Annual Exam and Hypertension (started back on BP meds 3 days ago)   HPI Christina Burke is a 55 year old female with history of hypertension, anxiety was previously followed by a PCP on [place] until the practice closed.  She was previously off blood pressure medications as she has lost 50 pounds but then regained the weight back and so had to restart her antihypertensives. She informs me of an episode of waking up with palpitations after a phone call and noticed she was tachycardic; she would like to have an EKG as she has a family history of cardiac disease in her dad. Denies chest pains, shortness of breath or palpitations at this time.  Also received clonazepam from a psychiatrist but no longer has insurance and cannot go there anymore   Past Medical History:  Diagnosis Date  . Anxiety   . Chest pain 12/08/2013  . GERD (gastroesophageal reflux disease) 08/10/2014  . Hyperlipidemia   . Hypertension   . Murmur 12/08/2013    Past Surgical History:  Procedure Laterality Date  . ABDOMINAL HYSTERECTOMY    . ABDOMINAL SURGERY      Allergies  Allergen Reactions  . Amoxicillin Rash    Has patient had a PCN reaction causing immediate rash, facial/tongue/throat swelling, SOB or lightheadedness with hypotension: yes Has patient had a PCN reaction causing severe rash involving mucus membranes or skin necrosis: no Has patient had a PCN reaction that required hospitalization: no Has patient had a PCN reaction occurring within the last 10 years: no If all of the above answers are "NO", then may proceed with Cephalosporin use.      Outpatient Medications Prior to Visit  Medication Sig Dispense Refill  . clonazePAM (KLONOPIN) 1 MG tablet Take 1 mg by mouth 2 (two) times daily.    . diclofenac sodium (VOLTAREN) 1 % GEL Apply 1 application topically 4 (four) times daily.  100 g 0  . nitroGLYCERIN (NITROSTAT) 0.4 MG SL tablet Place 1 tablet (0.4 mg total) under the tongue every 5 (five) minutes as needed. 25 tablet 3  . naproxen (NAPROSYN) 375 MG tablet Take 1 tablet (375 mg total) by mouth 2 (two) times daily. Prn pain (Patient not taking: Reported on 04/07/2016) 20 tablet 0  . omeprazole (PRILOSEC) 40 MG capsule Take 1 capsule (40 mg total) by mouth daily. (Patient not taking: Reported on 04/07/2016) 30 capsule 3  . phenazopyridine (PYRIDIUM) 200 MG tablet Take 1 tablet (200 mg total) by mouth 3 (three) times daily. 6 tablet 0   No facility-administered medications prior to visit.     ROS Review of Systems  Constitutional: Negative for activity change, appetite change and fatigue.  HENT: Negative for congestion, sinus pressure and sore throat.   Eyes: Negative for visual disturbance.  Respiratory: Negative for cough, chest tightness, shortness of breath and wheezing.   Cardiovascular: Negative for chest pain and palpitations.  Gastrointestinal: Negative for abdominal distention, abdominal pain and constipation.  Endocrine: Negative for polydipsia.  Genitourinary: Negative for dysuria and frequency.  Musculoskeletal: Negative for arthralgias and back pain.  Skin: Negative for rash.  Neurological: Negative for tremors, light-headedness and numbness.  Hematological: Does not bruise/bleed easily.  Psychiatric/Behavioral: Negative for agitation and behavioral problems.    Objective:  BP 133/83 (BP Location: Right Arm, Patient Position: Sitting, Cuff Size: Small)   Pulse 74   Temp 98.1 F (  36.7 C) (Oral)   Ht 5' 3.5" (1.613 m)   Wt 200 lb (90.7 kg)   SpO2 100%   BMI 34.87 kg/m   BP/Weight 04/07/2016 10/15/2015 [id number]  Systolic BP [id number]  Diastolic BP 83 87 77  Wt. (Lbs) 200 - 163  BMI 34.87 - 28.42      Physical Exam  Constitutional: She is oriented to person, place, and time. She appears well-developed and well-nourished.  HENT:    Right Ear: External ear normal.  Left Ear: External ear normal.  Cardiovascular: Normal rate, normal heart sounds and intact distal pulses.   No murmur heard. Pulmonary/Chest: Effort normal and breath sounds normal. She has no wheezes. She has no rales. She exhibits no tenderness.  Abdominal: Soft. Bowel sounds are normal. She exhibits no distension and no mass. There is no tenderness.  Musculoskeletal: Normal range of motion.  Neurological: She is alert and oriented to person, place, and time.     Assessment & Plan:   1. Essential hypertension Controlled - benazepril (LOTENSIN) 20 MG tablet; Take 1 tablet (20 mg total) by mouth daily.  Dispense: 30 tablet; Refill: 3 - COMPLETE METABOLIC PANEL WITH GFR; Future - Lipid panel; Future  2. Anxiety Advised of dependent nature of Klonopin and she will need to obtain this from psych otherwise I will be tapering her off this I have discussed the longer acting medications like SSRIs, BuSpar and she will think about this We'll see her back in 3 weeks to discuss this again. EKG revealed normal sinus rhythm with occasional PVCs, unchanged from previous EKG from 09/2015. - TSH; Future  3. HCM Up-to-date on mammogram and Pap smear as per patient Advised to bring in reports Declines flu shot  Meds ordered this encounter  Medications  . benazepril (LOTENSIN) 20 MG tablet    Sig: Take 1 tablet (20 mg total) by mouth daily.    Dispense:  30 tablet    Refill:  3    Follow-up: Return in about 1 month (around 05/08/2016) for Follow-up on anxiety.Arnoldo Morale MD

## 2016-04-07 NOTE — Patient Instructions (Signed)
Hypertension Hypertension, commonly called high blood pressure, is when the force of blood pumping through your arteries is too strong. Your arteries are the blood vessels that carry blood from your heart throughout your body. A blood pressure reading consists of a higher number over a lower number, such as 110/72. The higher number (systolic) is the pressure inside your arteries when your heart pumps. The lower number (diastolic) is the pressure inside your arteries when your heart relaxes. Ideally you want your blood pressure below 120/80. Hypertension forces your heart to work harder to pump blood. Your arteries may become narrow or stiff. Having untreated or uncontrolled hypertension can cause heart attack, stroke, kidney disease, and other problems. What increases the risk? Some risk factors for high blood pressure are controllable. Others are not. Risk factors you cannot control include:  Race. You may be at higher risk if you are African American.  Age. Risk increases with age.  Gender. Men are at higher risk than women before age 45 years. After age 65, women are at higher risk than men. Risk factors you can control include:  Not getting enough exercise or physical activity.  Being overweight.  Getting too much fat, sugar, calories, or salt in your diet.  Drinking too much alcohol. What are the signs or symptoms? Hypertension does not usually cause signs or symptoms. Extremely high blood pressure (hypertensive crisis) may cause headache, anxiety, shortness of breath, and nosebleed. How is this diagnosed? To check if you have hypertension, your health care provider will measure your blood pressure while you are seated, with your arm held at the level of your heart. It should be measured at least twice using the same arm. Certain conditions can cause a difference in blood pressure between your right and left arms. A blood pressure reading that is higher than normal on one occasion does  not mean that you need treatment. If it is not clear whether you have high blood pressure, you may be asked to return on a different day to have your blood pressure checked again. Or, you may be asked to monitor your blood pressure at home for 1 or more weeks. How is this treated? Treating high blood pressure includes making lifestyle changes and possibly taking medicine. Living a healthy lifestyle can help lower high blood pressure. You may need to change some of your habits. Lifestyle changes may include:  Following the DASH diet. This diet is high in fruits, vegetables, and whole grains. It is low in salt, red meat, and added sugars.  Keep your sodium intake below 2,300 mg per day.  Getting at least 30-45 minutes of aerobic exercise at least 4 times per week.  Losing weight if necessary.  Not smoking.  Limiting alcoholic beverages.  Learning ways to reduce stress. Your health care provider may prescribe medicine if lifestyle changes are not enough to get your blood pressure under control, and if one of the following is true:  You are 18-59 years of age and your systolic blood pressure is above 140.  You are 60 years of age or older, and your systolic blood pressure is above 150.  Your diastolic blood pressure is above 90.  You have diabetes, and your systolic blood pressure is over 140 or your diastolic blood pressure is over 90.  You have kidney disease and your blood pressure is above 140/90.  You have heart disease and your blood pressure is above 140/90. Your personal target blood pressure may vary depending on your medical   conditions, your age, and other factors. Follow these instructions at home:  Have your blood pressure rechecked as directed by your health care provider.  Take medicines only as directed by your health care provider. Follow the directions carefully. Blood pressure medicines must be taken as prescribed. The medicine does not work as well when you skip  doses. Skipping doses also puts you at risk for problems.  Do not smoke.  Monitor your blood pressure at home as directed by your health care provider. Contact a health care provider if:  You think you are having a reaction to medicines taken.  You have recurrent headaches or feel dizzy.  You have swelling in your ankles.  You have trouble with your vision. Get help right away if:  You develop a severe headache or confusion.  You have unusual weakness, numbness, or feel faint.  You have severe chest or abdominal pain.  You vomit repeatedly.  You have trouble breathing. This information is not intended to replace advice given to you by your health care provider. Make sure you discuss any questions you have with your health care provider. Document Released: 04/14/2005 Document Revised: 09/20/2015 Document Reviewed: 02/04/2013 Elsevier Interactive Patient Education  2017 [employer] Inc.  

## 2016-04-08 ENCOUNTER — Ambulatory Visit: Payer: Medicaid Other | Attending: Internal Medicine

## 2016-04-08 DIAGNOSIS — I10 Essential (primary) hypertension: Secondary | ICD-10-CM | POA: Insufficient documentation

## 2016-04-08 DIAGNOSIS — F419 Anxiety disorder, unspecified: Secondary | ICD-10-CM | POA: Insufficient documentation

## 2016-04-08 LAB — TSH: TSH: 1.25 m[IU]/L

## 2016-04-08 NOTE — Progress Notes (Signed)
Patient here for lab visit only 

## 2016-04-09 LAB — COMPLETE METABOLIC PANEL WITH GFR
ALT: 12 U/L (ref 6–29)
AST: 14 U/L (ref 10–35)
Albumin: 4.4 g/dL (ref 3.6–5.1)
Alkaline Phosphatase: 57 U/L (ref 33–130)
BUN: 20 mg/dL (ref 7–25)
CHLORIDE: 106 mmol/L (ref 98–110)
CO2: 25 mmol/L (ref 20–31)
Calcium: 9.2 mg/dL (ref 8.6–10.4)
Creat: 0.78 mg/dL (ref 0.50–1.05)
GFR, EST NON AFRICAN AMERICAN: 86 mL/min (ref >=60)
GLUCOSE: 97 mg/dL (ref 65–99)
Potassium: 4.5 mmol/L (ref 3.5–5.3)
SODIUM: 141 mmol/L (ref 135–146)
Total Bilirubin: 0.4 mg/dL (ref 0.2–1.2)
Total Protein: 6.5 g/dL (ref 6.1–8.1)

## 2016-04-09 LAB — LIPID PANEL
Cholesterol: 182 mg/dL (ref <200)
HDL: 47 mg/dL — AB (ref >50)
LDL CALC: 123 mg/dL — AB (ref <100)
TRIGLYCERIDES: 58 mg/dL (ref <150)
Total CHOL/HDL Ratio: 3.9 Ratio (ref <5.0)
VLDL: 12 mg/dL (ref <30)

## 2016-04-10 ENCOUNTER — Telehealth: Payer: Self-pay

## 2016-04-10 NOTE — Telephone Encounter (Signed)
-----   Message from Arnoldo Morale, MD sent at 04/09/2016  1:54 PM EST ----- Please inform the patient that labs are normal. Thank you.

## 2016-04-10 NOTE — Telephone Encounter (Signed)
Writer call ed patient per Dr. Jarold Song and discussed lab results.  Patient stated understanding.

## 2016-04-23 ENCOUNTER — Ambulatory Visit: Payer: Self-pay | Attending: Family Medicine

## 2016-05-06 ENCOUNTER — Ambulatory Visit: Payer: Self-pay | Attending: Family Medicine | Admitting: Family Medicine

## 2016-05-06 ENCOUNTER — Encounter: Payer: Self-pay | Admitting: Family Medicine

## 2016-05-06 VITALS — BP 152/78 | HR 92 | Temp 98.3°F | Ht 63.5 in | Wt 205.8 lb

## 2016-05-06 DIAGNOSIS — Z90710 Acquired absence of both cervix and uterus: Secondary | ICD-10-CM | POA: Insufficient documentation

## 2016-05-06 DIAGNOSIS — R011 Cardiac murmur, unspecified: Secondary | ICD-10-CM

## 2016-05-06 DIAGNOSIS — Z9189 Other specified personal risk factors, not elsewhere classified: Secondary | ICD-10-CM

## 2016-05-06 DIAGNOSIS — K219 Gastro-esophageal reflux disease without esophagitis: Secondary | ICD-10-CM | POA: Insufficient documentation

## 2016-05-06 DIAGNOSIS — Z880 Allergy status to penicillin: Secondary | ICD-10-CM | POA: Insufficient documentation

## 2016-05-06 DIAGNOSIS — I10 Essential (primary) hypertension: Secondary | ICD-10-CM | POA: Insufficient documentation

## 2016-05-06 DIAGNOSIS — E785 Hyperlipidemia, unspecified: Secondary | ICD-10-CM | POA: Insufficient documentation

## 2016-05-06 DIAGNOSIS — Z9889 Other specified postprocedural states: Secondary | ICD-10-CM | POA: Insufficient documentation

## 2016-05-06 DIAGNOSIS — F419 Anxiety disorder, unspecified: Secondary | ICD-10-CM | POA: Insufficient documentation

## 2016-05-06 NOTE — Patient Instructions (Signed)
[place], [place]

## 2016-05-06 NOTE — Progress Notes (Addendum)
   Subjective:    Patient ID: Christina Burke, female    DOB: 10/18/1960, 56 y.o.   MRN: [id number]  HPI She is a 56 year old female with a history of hypertension, anxiety who presents to the clinic today for a follow-up visit. She was previously on clonazepam for treatment of anxiety and we had discussed the use of longer acting medications like SSRI and BuSpar which she declines today and would like to stay on clonazepam. She is requesting a dental referral as she has not had dental cleaning in a while.  Past Medical History:  Diagnosis Date  . Anxiety   . Chest pain 12/08/2013  . GERD (gastroesophageal reflux disease) 08/10/2014  . Hyperlipidemia   . Hypertension   . Murmur 12/08/2013    Past Surgical History:  Procedure Laterality Date  . ABDOMINAL HYSTERECTOMY    . ABDOMINAL SURGERY      Allergies  Allergen Reactions  . Amoxicillin Rash    Has patient had a PCN reaction causing immediate rash, facial/tongue/throat swelling, SOB or lightheadedness with hypotension: yes Has patient had a PCN reaction causing severe rash involving mucus membranes or skin necrosis: no Has patient had a PCN reaction that required hospitalization: no Has patient had a PCN reaction occurring within the last 10 years: no If all of the above answers are "NO", then may proceed with Cephalosporin use.       Review of Systems  Constitutional: Negative for activity change and appetite change.  HENT: Negative for sinus pressure and sore throat.   Respiratory: Negative for chest tightness, shortness of breath and wheezing.   Cardiovascular: Negative for chest pain and palpitations.  Gastrointestinal: Negative for abdominal distention, abdominal pain and constipation.  Genitourinary: Negative.   Musculoskeletal: Negative.   Psychiatric/Behavioral: Negative for behavioral problems and dysphoric mood.       Positive for anxiety       Objective: Vitals:   05/06/16 1103  BP: (!) 152/78  Pulse: 92   Temp: 98.3 F (36.8 C)      Physical Exam  Constitutional: She is oriented to person, place, and time. She appears well-developed and well-nourished.  Cardiovascular: Normal rate and intact distal pulses.   Murmur (2/6 systolic) heard. Pulmonary/Chest: Effort normal and breath sounds normal. She has no wheezes. She has no rales. She exhibits no tenderness.  Abdominal: Soft. Bowel sounds are normal. She exhibits no distension and no mass. There is no tenderness.  Musculoskeletal: Normal range of motion.  Neurological: She is alert and oriented to person, place, and time.          Assessment & Plan:  1. Essential hypertension Uncontrolled due to the fact that she just took antihypertensives a few minutes ago Low-sodium diet - benazepril (LOTENSIN) 20 MG tablet; Take 1 tablet (20 mg total) by mouth daily.  Dispense: 30 tablet; Refill: 3  2. Anxiety She is refusing SSRI and buspirone would like to stay on Klonopin. Provided with resources for mental health where she can be seen without insurance- [facility] in [place] and [facility] in [place].   3. HCM Up-to-date on mammogram and Pap smear as per patient Advised to bring in reports Declines flu shot  4. Need for dental care Referred to Dentist

## 2016-07-04 MED FILL — BENAZEPRIL HCL 20 MG TABLET: 20 | 30 days supply | Qty: 30 | Fill #1

## 2016-07-21 ENCOUNTER — Encounter (HOSPITAL_COMMUNITY): Payer: Self-pay | Admitting: Emergency Medicine

## 2016-07-21 ENCOUNTER — Ambulatory Visit (HOSPITAL_COMMUNITY)
Admission: EM | Admit: 2016-07-21 | Discharge: 2016-07-21 | Disposition: A | Discharge status: Home / Self Care | Payer: Self-pay | Attending: Family Medicine | Admitting: Family Medicine

## 2016-07-21 ENCOUNTER — Ambulatory Visit (INDEPENDENT_AMBULATORY_CARE_PROVIDER_SITE_OTHER): Payer: Self-pay

## 2016-07-21 DIAGNOSIS — M25562 Pain in left knee: Secondary | ICD-10-CM

## 2016-07-21 DIAGNOSIS — S838X2A Sprain of other specified parts of left knee, initial encounter: Secondary | ICD-10-CM

## 2016-07-21 IMAGING — DX DG KNEE AP/LAT W/ SUNRISE*L*
3 series · 3 of 3 positions shown · non-contrast
Comparison: None.

CLINICAL DATA: Chronic left knee pain and popping. No known injury.

EXAM:
LEFT KNEE 3 VIEWS

[knee ap]
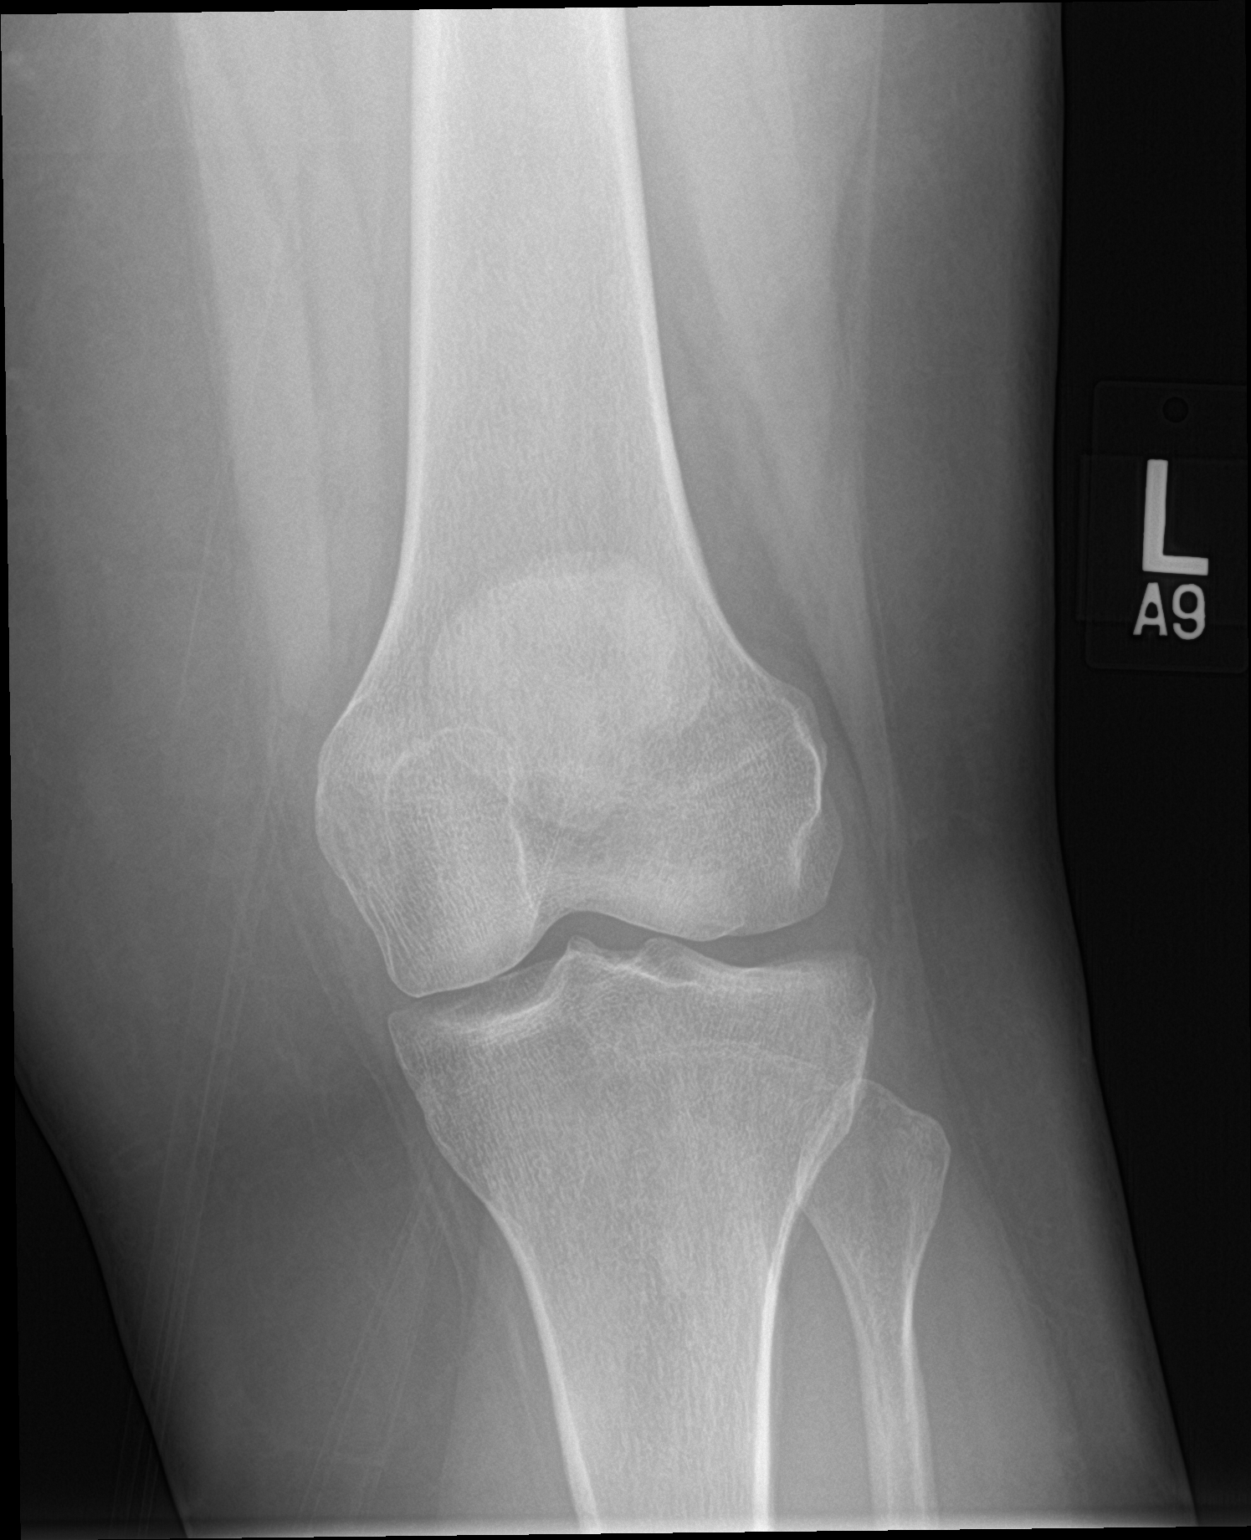

[knee lat]
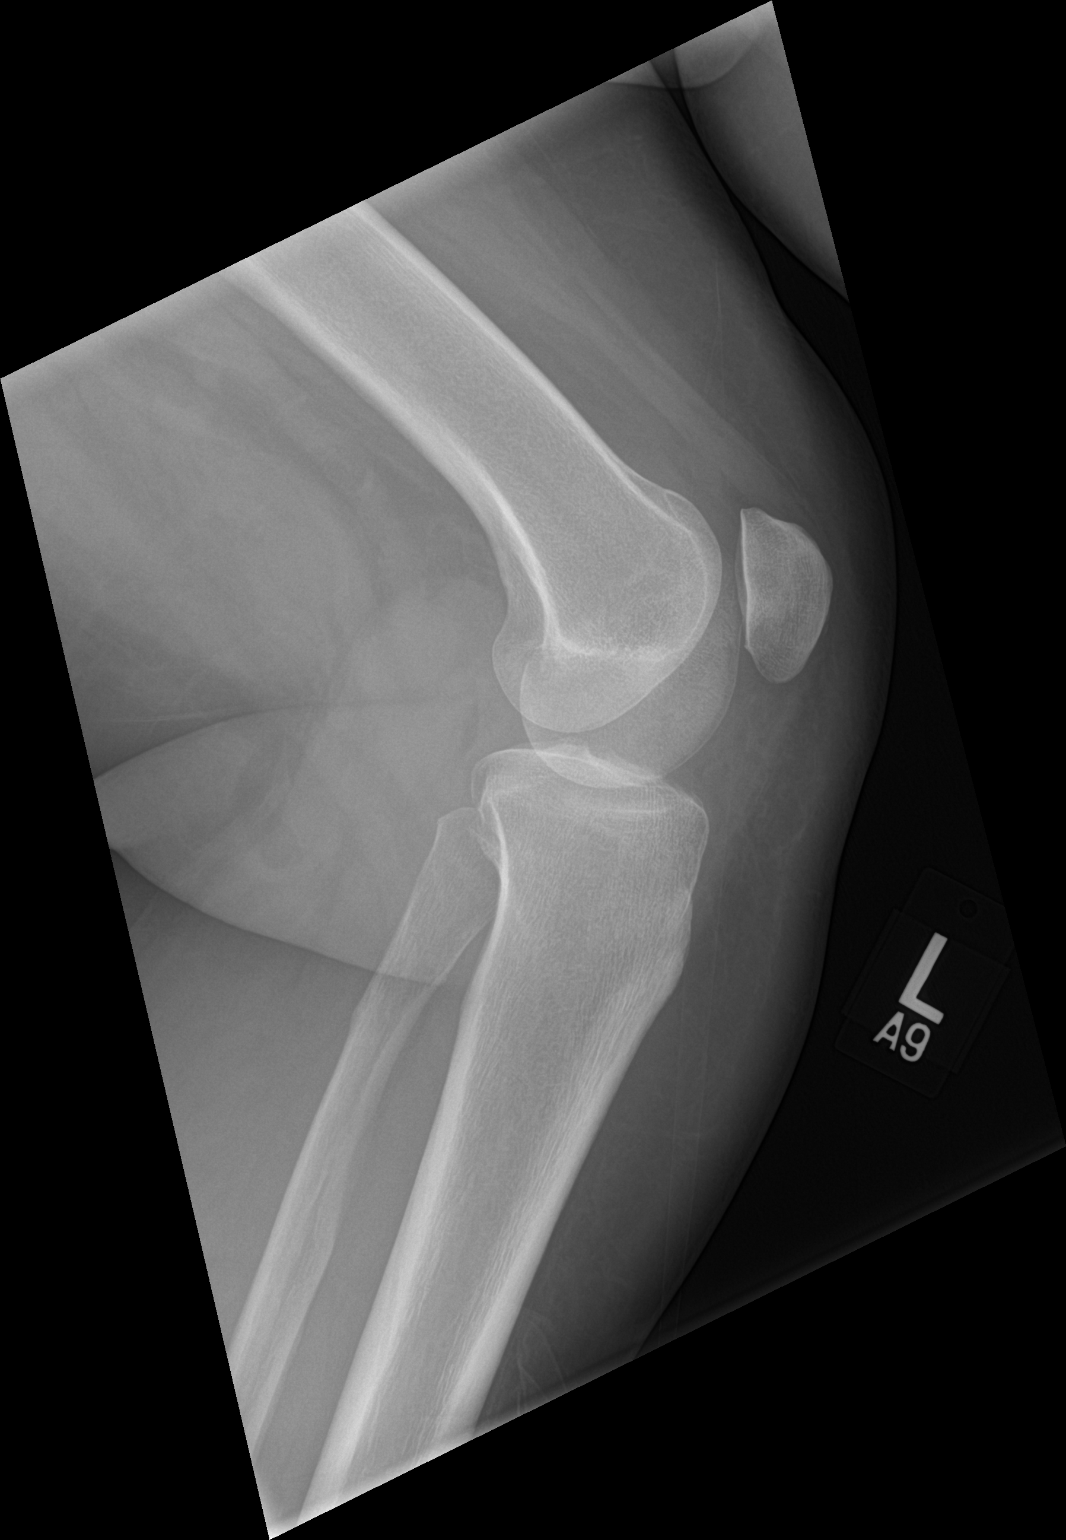

[knee sunrise]
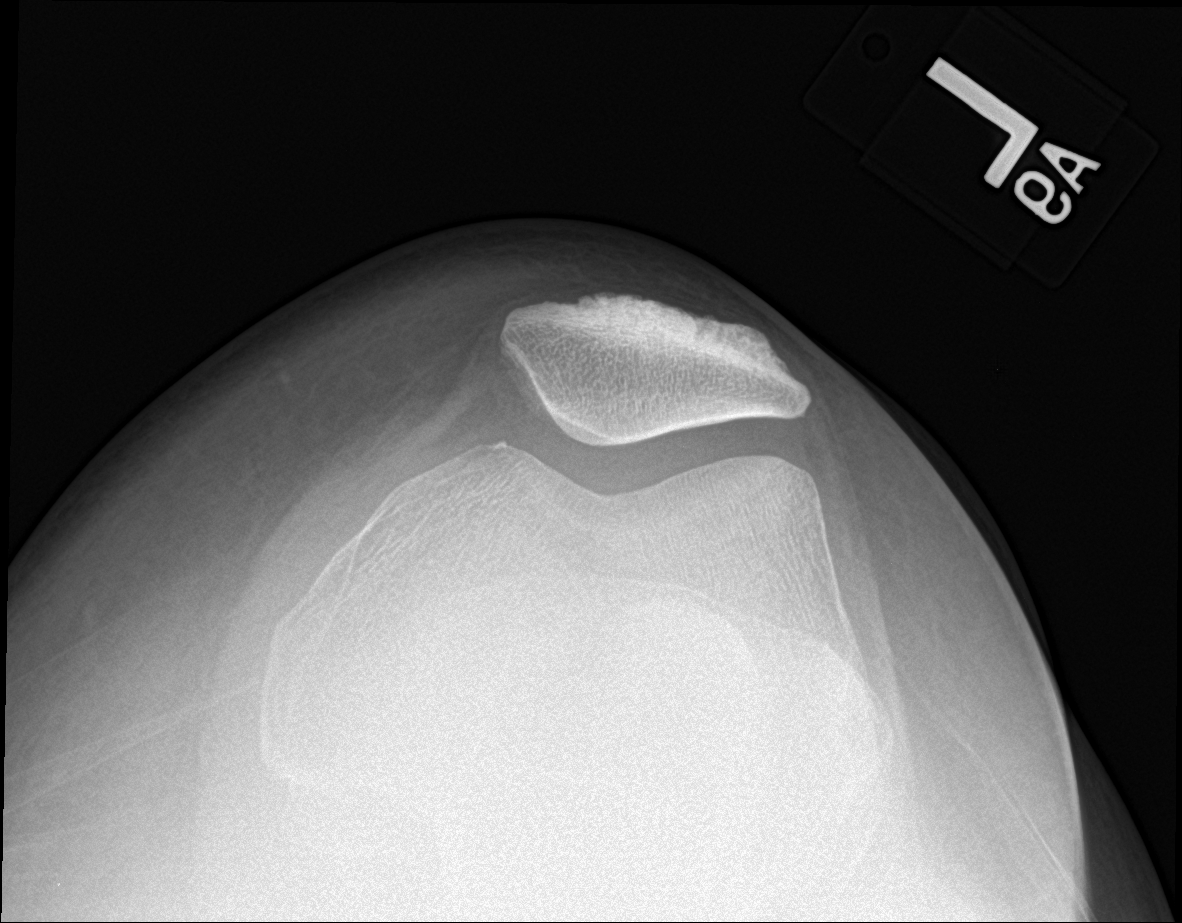

[3 of 3 positions shown; findings below may reference images not displayed]

FINDINGS: No evidence of fracture, dislocation, or joint effusion. No evidence
of arthropathy or other focal bone abnormality. Soft tissues are
unremarkable.
IMPRESSION: Normal exam.

## 2016-07-21 NOTE — ED Triage Notes (Signed)
The patient presented to the [facility] with a complaint of left knee pain x 1 month that got worse on Friday when moving a couch.

## 2016-07-21 NOTE — ED Provider Notes (Signed)
CSN: [id number]     Arrival date & time 07/21/16  1258 History   None    Chief Complaint  Patient presents with  . Knee Pain   (Consider location/radiation/quality/duration/timing/severity/associated sxs/prior Treatment) The history is provided by the patient.  Knee Pain  Location:  Knee Time since incident:  3 days (worse since Friday ) Injury: no   Knee location:  L knee Pain details:    Quality:  Aching and pressure   Severity:  Mild   Onset quality:  Sudden   Duration:  3 days   Timing:  Intermittent Chronicity:  New Dislocation: no   %% y/o female presented with left knee pain x 1 month worse x 3 days when engaged in pushing a cough. Reports feels knee popping out. No visible swelling. bruising noted. Gait steady.   Past Medical History:  Diagnosis Date  . Anxiety   . Chest pain 12/08/2013  . GERD (gastroesophageal reflux disease) 08/10/2014  . Hyperlipidemia   . Hypertension   . Murmur 12/08/2013   Past Surgical History:  Procedure Laterality Date  . ABDOMINAL HYSTERECTOMY    . ABDOMINAL SURGERY     Family History  Problem Relation Age of Onset  . Gallbladder disease Mother   . Heart disease Father   . Colon polyps Sister   . Diabetes Sister     x3  . Diabetes Brother   . Colon cancer Neg Hx   . Kidney disease Neg Hx   . Esophageal cancer Neg Hx    Social History  Substance Use Topics  . Smoking status: Never Smoker  . Smokeless tobacco: Never Used  . Alcohol use 0.0 oz/week     Comment: occasional   OB History    No data available     Review of Systems  Constitutional: Negative.   Musculoskeletal:       Left knee pain x 3 days  Skin: Negative for color change.    Allergies  Amoxicillin  Home Medications   Prior to Admission medications   Medication Sig Start Date End Date Taking? Authorizing Provider  benazepril (LOTENSIN) 20 MG tablet Take 1 tablet (20 mg total) by mouth daily. 04/07/16  Yes Arnoldo Morale, MD  clonazePAM (KLONOPIN) 1 MG  tablet Take 1 mg by mouth 2 (two) times daily.   Yes Historical Provider, MD  nitroGLYCERIN (NITROSTAT) 0.4 MG SL tablet Place 1 tablet (0.4 mg total) under the tongue every 5 (five) minutes as needed. 01/25/14   Josue Hector, MD   Meds Ordered and Administered this Visit  Medications - No data to display  BP 137/69 (BP Location: Right Arm)   Pulse 88   Temp 98.7 F (37.1 C) (Oral)   Resp 18   SpO2 99%  No data found.   Physical Exam  Constitutional: She appears well-developed and well-nourished. No distress.  HENT:  Head: Normocephalic.  Eyes: Pupils are equal, round, and reactive to light.  Cardiovascular: Normal rate, regular rhythm and normal heart sounds.   Pulmonary/Chest: Effort normal and breath sounds normal.  Musculoskeletal: Normal range of motion. She exhibits no edema (No visible swelling to left knee ), tenderness (No pain to palpation to knee. Normal ROM with flexion and extension. Negative Valgus/Varus/anetrior /posterior draw test.) or deformity.  Skin: Skin is warm.    Urgent Care Course     Procedures (including critical care time)  Labs Review Labs Reviewed - No data to display  Imaging Review Dg Knee Ap/lat W/sunrise Left  Result Date: 07/21/2016 CLINICAL DATA:  Chronic left knee pain and popping. No known injury. EXAM: LEFT KNEE 3 VIEWS COMPARISON:  None. FINDINGS: No evidence of fracture, dislocation, or joint effusion. No evidence of arthropathy or other focal bone abnormality. Soft tissues are unremarkable. IMPRESSION: Normal exam. Electronically Signed   By: Inge Rise M.D.   On: 07/21/2016 15:29     Visual Acuity Review  Right Eye Distance:   Left Eye Distance:   Bilateral Distance:    Right Eye Near:   Left Eye Near:    Bilateral Near:         MDM   1. Acute pain of left knee   2. Sprain of other ligament of left knee, initial encounter   XR: Negative for fracture/dislocation. Ace wrap applied. Educated on [place]. If no  improvement in Sx X 2 weeks advised to FU with PCP for further recommendations     Trevione Wert, NP 07/21/16 1548

## 2016-07-21 NOTE — Discharge Instructions (Signed)
Heating pad to knee as directed. Use ace wrap and Tylenol/Motrin as needed for pain. If no improvement x 2 weeks recommended to FU with Ortho for further evaluation and recommendations.

## 2016-08-04 ENCOUNTER — Ambulatory Visit: Payer: Self-pay | Attending: Family Medicine | Admitting: Family Medicine

## 2016-08-04 ENCOUNTER — Encounter: Payer: Self-pay | Admitting: Family Medicine

## 2016-08-04 VITALS — BP 129/84 | HR 68 | Temp 98.0°F | Ht 63.5 in | Wt 202.0 lb

## 2016-08-04 DIAGNOSIS — M25562 Pain in left knee: Secondary | ICD-10-CM | POA: Insufficient documentation

## 2016-08-04 DIAGNOSIS — G8929 Other chronic pain: Secondary | ICD-10-CM | POA: Insufficient documentation

## 2016-08-04 DIAGNOSIS — I10 Essential (primary) hypertension: Secondary | ICD-10-CM | POA: Insufficient documentation

## 2016-08-04 DIAGNOSIS — Z90710 Acquired absence of both cervix and uterus: Secondary | ICD-10-CM | POA: Insufficient documentation

## 2016-08-04 DIAGNOSIS — E785 Hyperlipidemia, unspecified: Secondary | ICD-10-CM | POA: Insufficient documentation

## 2016-08-04 DIAGNOSIS — Z79899 Other long term (current) drug therapy: Secondary | ICD-10-CM | POA: Insufficient documentation

## 2016-08-04 DIAGNOSIS — F419 Anxiety disorder, unspecified: Secondary | ICD-10-CM | POA: Insufficient documentation

## 2016-08-04 DIAGNOSIS — Z9889 Other specified postprocedural states: Secondary | ICD-10-CM | POA: Insufficient documentation

## 2016-08-04 DIAGNOSIS — K219 Gastro-esophageal reflux disease without esophagitis: Secondary | ICD-10-CM | POA: Insufficient documentation

## 2016-08-04 DIAGNOSIS — Z880 Allergy status to penicillin: Secondary | ICD-10-CM | POA: Insufficient documentation

## 2016-08-04 MED ORDER — BENAZEPRIL HCL 20 MG PO TABS: 20.0000 mg | ORAL_TABLET | Freq: Every day | ORAL | 3 refills | Status: DC

## 2016-08-04 MED FILL — BENAZEPRIL HCL 20 MG TABLET: 20 | 30 days supply | Qty: 30 | Fill #2

## 2016-08-04 NOTE — Patient Instructions (Signed)

## 2016-08-04 NOTE — Progress Notes (Signed)
Subjective:  Patient ID: Christina Burke, female    DOB: June 24, 1960  Age: 56 y.o. MRN: [id number]  CC: Hypertension and knee issue (left side "popping")   HPI Christina Burke is a 56 year old female with a history of hypertension who presents today for follow-up visit. She endorses compliance with medications and is working to lose weight; currently on a low-carb diet. She had an ED visit on 07/21/16 for left knee pain and xraywas unremarkable. Today she complains of left knee popping and has occasional pain of up to 5/10 whenever she wears shoes with heels but pain decreases to about 2-3/10 with tennis shoes. Onset of pain dates back to 3-4 months ago while she was attempting to change a car tire; also feels increased tightness in her posterior left knee when walking and does not take any analgesic, denies swelling.   Past Medical History:  Diagnosis Date  . Anxiety   . Chest pain 12/08/2013  . GERD (gastroesophageal reflux disease) 08/10/2014  . Hyperlipidemia   . Hypertension   . Murmur 12/08/2013    Past Surgical History:  Procedure Laterality Date  . ABDOMINAL HYSTERECTOMY    . ABDOMINAL SURGERY      Allergies  Allergen Reactions  . Amoxicillin Rash    Has patient had a PCN reaction causing immediate rash, facial/tongue/throat swelling, SOB or lightheadedness with hypotension: yes Has patient had a PCN reaction causing severe rash involving mucus membranes or skin necrosis: no Has patient had a PCN reaction that required hospitalization: no Has patient had a PCN reaction occurring within the last 10 years: no If all of the above answers are "NO", then may proceed with Cephalosporin use.      Outpatient Medications Prior to Visit  Medication Sig Dispense Refill  . clonazePAM (KLONOPIN) 1 MG tablet Take 1 mg by mouth 2 (two) times daily.    . nitroGLYCERIN (NITROSTAT) 0.4 MG SL tablet Place 1 tablet (0.4 mg total) under the tongue every 5 (five) minutes as needed. 25  tablet 3  . benazepril (LOTENSIN) 20 MG tablet Take 1 tablet (20 mg total) by mouth daily. 30 tablet 3   No facility-administered medications prior to visit.     ROS Review of Systems  Constitutional: Negative for activity change, appetite change and fatigue.  HENT: Negative for congestion, sinus pressure and sore throat.   Eyes: Negative for visual disturbance.  Respiratory: Negative for cough, chest tightness, shortness of breath and wheezing.   Cardiovascular: Negative for chest pain and palpitations.  Gastrointestinal: Negative for abdominal distention, abdominal pain and constipation.  Endocrine: Negative for polydipsia.  Genitourinary: Negative for dysuria and frequency.  Musculoskeletal:       See hpi  Skin: Negative for rash.  Neurological: Negative for tremors, light-headedness and numbness.  Hematological: Does not bruise/bleed easily.  Psychiatric/Behavioral: Negative for agitation and behavioral problems.    Objective:  BP 129/84 (BP Location: Right Arm, Patient Position: Sitting, Cuff Size: Small)   Pulse 68   Temp 98 F (36.7 C) (Oral)   Ht 5' 3.5" (1.613 m)   Wt 202 lb (91.6 kg)   SpO2 98%   BMI 35.22 kg/m   BP/Weight 08/04/2016 09/13/8414 6/0/6301  Systolic BP 601 093 235  Diastolic BP 84 69 78  Wt. (Lbs) 202 - 205.8  BMI 35.22 - 35.88      Physical Exam Constitutional: She is oriented to person, place, and time. She appears well-developed and well-nourished.  Cardiovascular: Normal rate and intact distal pulses.  Murmur (2/6 systolic) heard. Pulmonary/Chest: Effort normal and breath sounds normal. She has no wheezes. She has no rales. She exhibits no tenderness.  Abdominal: Soft. Bowel sounds are normal. She exhibits no distension and no mass. There is no tenderness.  Musculoskeletal: Normal range of motion.  no tenderness on palpation or range of motion of knees  Neurological: She is alert and oriented to person, place, and time.    Assessment &  Plan:   1. Essential hypertension Controlled Low-sodium diet - benazepril (LOTENSIN) 20 MG tablet; Take 1 tablet (20 mg total) by mouth daily.  Dispense: 30 tablet; Refill: 3 - CMP14+EGFR  2. Chronic pain of left knee Pain is minimal Advised to use OTC NSAIDs Advised weight loss will also help Knee brace,RICE prn   Meds ordered this encounter  Medications  . benazepril (LOTENSIN) 20 MG tablet    Sig: Take 1 tablet (20 mg total) by mouth daily.    Dispense:  30 tablet    Refill:  3    Follow-up: Return in about 3 weeks (around 08/25/2016) for Complete physical exam.   Arnoldo Morale MD

## 2016-08-05 LAB — CMP14+EGFR
A/G RATIO: 1.7 (ref 1.2–2.2)
ALBUMIN: 4.5 g/dL (ref 3.5–5.5)
ALT: 14 IU/L (ref 0–32)
AST: 22 IU/L (ref 0–40)
Alkaline Phosphatase: 78 IU/L (ref 39–117)
BUN / CREAT RATIO: 23 (ref 9–23)
BUN: 17 mg/dL (ref 6–24)
Bilirubin Total: 0.3 mg/dL (ref 0.0–1.2)
CO2: 21 mmol/L (ref 18–29)
Calcium: 9.5 mg/dL (ref 8.7–10.2)
Chloride: 100 mmol/L (ref 96–106)
Creatinine, Ser: 0.73 mg/dL (ref 0.57–1.00)
GFR calc non Af Amer: 93 mL/min/{1.73_m2} (ref >59)
GFR, EST AFRICAN AMERICAN: 107 mL/min/{1.73_m2} (ref >59)
GLOBULIN, TOTAL: 2.7 g/dL (ref 1.5–4.5)
Glucose: 90 mg/dL (ref 65–99)
POTASSIUM: 4.2 mmol/L (ref 3.5–5.2)
Sodium: 142 mmol/L (ref 134–144)
TOTAL PROTEIN: 7.2 g/dL (ref 6.0–8.5)

## 2016-08-07 ENCOUNTER — Telehealth: Payer: Self-pay | Admitting: Family Medicine

## 2016-08-07 NOTE — Telephone Encounter (Signed)
Patient states she received a call from the nurse. Please follow up

## 2016-08-08 NOTE — Telephone Encounter (Signed)
Pt aware of lab results. Name and DOB verified.  

## 2016-08-29 ENCOUNTER — Encounter: Payer: Self-pay | Admitting: Family Medicine

## 2016-08-29 ENCOUNTER — Ambulatory Visit: Payer: Self-pay | Attending: Family Medicine | Admitting: Family Medicine

## 2016-08-29 VITALS — BP 125/85 | HR 73 | Temp 98.0°F | Resp 18 | Ht 63.5 in | Wt 197.0 lb

## 2016-08-29 DIAGNOSIS — Z01419 Encounter for gynecological examination (general) (routine) without abnormal findings: Secondary | ICD-10-CM | POA: Insufficient documentation

## 2016-08-29 DIAGNOSIS — Z79899 Other long term (current) drug therapy: Secondary | ICD-10-CM | POA: Insufficient documentation

## 2016-08-29 DIAGNOSIS — Z23 Encounter for immunization: Secondary | ICD-10-CM

## 2016-08-29 DIAGNOSIS — Z114 Encounter for screening for human immunodeficiency virus [HIV]: Secondary | ICD-10-CM

## 2016-08-29 DIAGNOSIS — Z1239 Encounter for other screening for malignant neoplasm of breast: Secondary | ICD-10-CM

## 2016-08-29 DIAGNOSIS — E7800 Pure hypercholesterolemia, unspecified: Secondary | ICD-10-CM | POA: Insufficient documentation

## 2016-08-29 DIAGNOSIS — I10 Essential (primary) hypertension: Secondary | ICD-10-CM | POA: Insufficient documentation

## 2016-08-29 DIAGNOSIS — Z880 Allergy status to penicillin: Secondary | ICD-10-CM | POA: Insufficient documentation

## 2016-08-29 DIAGNOSIS — Z1231 Encounter for screening mammogram for malignant neoplasm of breast: Secondary | ICD-10-CM

## 2016-08-29 DIAGNOSIS — Z0000 Encounter for general adult medical examination without abnormal findings: Secondary | ICD-10-CM

## 2016-08-29 DIAGNOSIS — Z90710 Acquired absence of both cervix and uterus: Secondary | ICD-10-CM | POA: Insufficient documentation

## 2016-08-29 MED ORDER — BENAZEPRIL HCL 20 MG PO TABS: 20.0000 mg | ORAL_TABLET | Freq: Every day | ORAL | 6 refills | Status: DC

## 2016-08-29 NOTE — Progress Notes (Signed)
Physical. Pt fasting  hysterectomy due to heavy menses  No pain today  No tobacco user, no drugs ETOH OCC

## 2016-08-29 NOTE — Progress Notes (Signed)
Subjective:  Patient ID: Christina Burke, female    DOB: [birth year]  Age: 56 y.o. MRN: [id number]  CC: Annual Physical  HPI Christina Burke presents for a complete physical exam. Christina Burke Christina Burke had a normal colonoscopy in 12/2010. Christina Burke is status post hysterectomy due to menorrhagia but also informs me that Christina Burke had 'early cancer cells'.  Past Medical History:  Diagnosis Date  . Anxiety   . Chest pain 12/08/2013  . GERD (gastroesophageal reflux disease) 08/10/2014  . Hyperlipidemia   . Hypertension   . Murmur 12/08/2013    Past Surgical History:  Procedure Laterality Date  . ABDOMINAL HYSTERECTOMY    . ABDOMINAL SURGERY      Allergies  Allergen Reactions  . Amoxicillin Rash    Has patient had a PCN reaction causing immediate rash, facial/tongue/throat swelling, SOB or lightheadedness with hypotension: yes Has patient had a PCN reaction causing severe rash involving mucus membranes or skin necrosis: no Has patient had a PCN reaction that required hospitalization: no Has patient had a PCN reaction occurring within the last 10 years: no If all of the above answers are "NO", then may proceed with Cephalosporin use.      Outpatient Medications Prior to Visit  Medication Sig Dispense Refill  . clonazePAM (KLONOPIN) 1 MG tablet Take 1 mg by mouth 2 (two) times daily.    . nitroGLYCERIN (NITROSTAT) 0.4 MG SL tablet Place 1 tablet (0.4 mg total) under the tongue every 5 (five) minutes as needed. 25 tablet 3  . benazepril (LOTENSIN) 20 MG tablet Take 1 tablet (20 mg total) by mouth daily. 30 tablet 3   No facility-administered medications prior to visit.     ROS Review of Systems  Constitutional: Negative for activity change, appetite change and fatigue.  HENT: Negative for congestion, sinus pressure and sore throat.   Eyes: Negative for visual disturbance.  Respiratory: Negative for cough, chest tightness, shortness of breath and wheezing.   Cardiovascular: Negative for chest pain and  palpitations.  Gastrointestinal: Negative for abdominal distention, abdominal pain and constipation.  Endocrine: Negative for polydipsia.  Genitourinary: Negative for dysuria and frequency.  Musculoskeletal: Negative for arthralgias and back pain.  Skin: Negative for rash.  Neurological: Negative for tremors, light-headedness and numbness.  Hematological: Does not bruise/bleed easily.  Psychiatric/Behavioral: Negative for agitation and behavioral problems.    Objective:  BP 125/85 (BP Location: Left Arm, Patient Position: Sitting, Cuff Size: Normal)   Pulse 73   Temp 98 F (36.7 C) (Oral)   Resp 18   Ht 5' 3.5" (1.613 m)   Wt 197 lb (89.4 kg)   SpO2 100%   BMI 34.35 kg/m   BP/Weight 08/29/2016 08/04/2016 0/24/0973  Systolic BP 532 992 426  Diastolic BP 85 84 69  Wt. (Lbs) 197 202 -  BMI 34.35 35.22 -      Physical Exam  Constitutional: Christina Burke is oriented to person, place, and time. Christina Burke appears well-developed and well-nourished. No distress.  HENT:  Head: Normocephalic.  Right Ear: External ear normal.  Left Ear: External ear normal.  Nose: Nose normal.  Mouth/Throat: Oropharynx is clear and moist.  Eyes: Conjunctivae and EOM are normal. Pupils are equal, round, and reactive to light.  Neck: Normal range of motion. No JVD present.  Cardiovascular: Normal rate, regular rhythm and intact distal pulses.  Exam reveals no gallop.   Murmur (2/6 murmur) heard. Pulmonary/Chest: Effort normal and breath sounds normal. No respiratory distress. Christina Burke has no wheezes. Christina Burke has no rales.  Christina Burke exhibits no tenderness. Right breast exhibits no mass and no nipple discharge. Left breast exhibits no mass and no nipple discharge.  Abdominal: Soft. Bowel sounds are normal. Christina Burke exhibits no distension and no mass. There is no tenderness.  Genitourinary:  Genitourinary Comments: Normal external genitalia and vagina.  Cervix is absent  Musculoskeletal: Normal range of motion. Christina Burke exhibits no edema or  tenderness.  Neurological: Christina Burke is alert and oriented to person, place, and time. Christina Burke has normal reflexes.  Skin: Skin is warm and dry. Christina Burke is not diaphoretic.  Psychiatric: Christina Burke has a normal mood and affect.     Assessment & Plan:   1. Annual physical exam Given history of questionable early stage cancer I will perform a vaginal Pap smear even though Christina Burke is status post hysterectomy. We will need to obtain records - Cytology - PAP [facility]  2. Screening for breast cancer - MM Digital Screening; Future  3. Pure hypercholesterolemia - CMP14+EGFR - Lipid panel  4. Screening for HIV (human immunodeficiency virus) - HIV antibody  5. Need for prophylactic vaccination with combined diphtheria-tetanus-pertussis (DTaP) vaccine   6. Essential hypertension - benazepril (LOTENSIN) 20 MG tablet; Take 1 tablet (20 mg total) by mouth daily.  Dispense: 30 tablet; Refill: 6   Meds ordered this encounter  Medications  . benazepril (LOTENSIN) 20 MG tablet    Sig: Take 1 tablet (20 mg total) by mouth daily.    Dispense:  30 tablet    Refill:  6    Follow-up: Return in about 6 months (around 03/01/2017) for Follow-up of hypertension.   Arnoldo Morale MD

## 2016-08-29 NOTE — Patient Instructions (Signed)
Health Maintenance, Female Adopting a healthy lifestyle and getting preventive care can go a long way to promote health and wellness. Talk with your health care provider about what schedule of regular examinations is right for you. This is a good chance for you to check in with your provider about disease prevention and staying healthy. In between checkups, there are plenty of things you can do on your own. Experts have done a lot of research about which lifestyle changes and preventive measures are most likely to keep you healthy. Ask your health care provider for more information. Weight and diet Eat a healthy diet  Be sure to include plenty of vegetables, fruits, low-fat dairy products, and lean protein.  Do not eat a lot of foods high in solid fats, added sugars, or salt.  Get regular exercise. This is one of the most important things you can do for your health.  Most adults should exercise for at least 150 minutes each week. The exercise should increase your heart rate and make you sweat (moderate-intensity exercise).  Most adults should also do strengthening exercises at least twice a week. This is in addition to the moderate-intensity exercise. Maintain a healthy weight  Body mass index (BMI) is a measurement that can be used to identify possible weight problems. It estimates body fat based on height and weight. Your health care provider can help determine your BMI and help you achieve or maintain a healthy weight.  For females 76 years of age and older:  A BMI below 18.5 is considered underweight.  A BMI of 18.5 to 24.9 is normal.  A BMI of 25 to 29.9 is considered overweight.  A BMI of 30 and above is considered obese. Watch levels of cholesterol and blood lipids  You should start having your blood tested for lipids and cholesterol at 56 years of age, then have this test every 5 years.  You may need to have your cholesterol levels checked more often if:  Your lipid or  cholesterol levels are high.  You are older than 56 years of age.  You are at high risk for heart disease. Cancer screening Lung Cancer  Lung cancer screening is recommended for adults 64-42 years old who are at high risk for lung cancer because of a history of smoking.  A yearly low-dose CT scan of the lungs is recommended for people who:  Currently smoke.  Have quit within the past 15 years.  Have at least a 30-pack-year history of smoking. A pack year is smoking an average of one pack of cigarettes a day for 1 year.  Yearly screening should continue until it has been 15 years since you quit.  Yearly screening should stop if you develop a health problem that would prevent you from having lung cancer treatment. Breast Cancer  Practice breast self-awareness. This means understanding how your breasts normally appear and feel.  It also means doing regular breast self-exams. Let your health care provider know about any changes, no matter how small.  If you are in your 20s or 30s, you should have a clinical breast exam (CBE) by a health care provider every 1-3 years as part of a regular health exam.  If you are 34 or older, have a CBE every year. Also consider having a breast X-ray (mammogram) every year.  If you have a family history of breast cancer, talk to your health care provider about genetic screening.  If you are at high risk for breast cancer, talk  to your health care provider about having an MRI and a mammogram every year.  Breast cancer gene (BRCA) assessment is recommended for women who have family members with BRCA-related cancers. BRCA-related cancers include:  Breast.  Ovarian.  Tubal.  Peritoneal cancers.  Results of the assessment will determine the need for genetic counseling and BRCA1 and BRCA2 testing. Cervical Cancer  Your health care provider may recommend that you be screened regularly for cancer of the pelvic organs (ovaries, uterus, and vagina).  This screening involves a pelvic examination, including checking for microscopic changes to the surface of your cervix (Pap test). You may be encouraged to have this screening done every 3 years, beginning at age 24.  For women ages 66-65, health care providers may recommend pelvic exams and Pap testing every 3 years, or they may recommend the Pap and pelvic exam, combined with testing for human papilloma virus (HPV), every 5 years. Some types of HPV increase your risk of cervical cancer. Testing for HPV may also be done on women of any age with unclear Pap test results.  Other health care providers may not recommend any screening for nonpregnant women who are considered low risk for pelvic cancer and who do not have symptoms. Ask your health care provider if a screening pelvic exam is right for you.  If you have had past treatment for cervical cancer or a condition that could lead to cancer, you need Pap tests and screening for cancer for at least 20 years after your treatment. If Pap tests have been discontinued, your risk factors (such as having a new sexual partner) need to be reassessed to determine if screening should resume. Some women have medical problems that increase the chance of getting cervical cancer. In these cases, your health care provider may recommend more frequent screening and Pap tests. Colorectal Cancer  This type of cancer can be detected and often prevented.  Routine colorectal cancer screening usually begins at 56 years of age and continues through 56 years of age.  Your health care provider may recommend screening at an earlier age if you have risk factors for colon cancer.  Your health care provider may also recommend using home test kits to check for hidden blood in the stool.  A small camera at the end of a tube can be used to examine your colon directly (sigmoidoscopy or colonoscopy). This is done to check for the earliest forms of colorectal cancer.  Routine  screening usually begins at age 41.  Direct examination of the colon should be repeated every 5-10 years through 56 years of age. However, you may need to be screened more often if early forms of precancerous polyps or small growths are found. Skin Cancer  Check your skin from head to toe regularly.  Tell your health care provider about any new moles or changes in moles, especially if there is a change in a mole's shape or color.  Also tell your health care provider if you have a mole that is larger than the size of a pencil eraser.  Always use sunscreen. Apply sunscreen liberally and repeatedly throughout the day.  Protect yourself by wearing long sleeves, pants, a wide-brimmed hat, and sunglasses whenever you are outside. Heart disease, diabetes, and high blood pressure  High blood pressure causes heart disease and increases the risk of stroke. High blood pressure is more likely to develop in:  People who have blood pressure in the high end of the normal range (130-139/85-89 mm Hg).  People who are overweight or obese.  People who are African American.  If you are 59-24 years of age, have your blood pressure checked every 3-5 years. If you are 34 years of age or older, have your blood pressure checked every year. You should have your blood pressure measured twice-once when you are at a hospital or clinic, and once when you are not at a hospital or clinic. Record the average of the two measurements. To check your blood pressure when you are not at a hospital or clinic, you can use:  An automated blood pressure machine at a pharmacy.  A home blood pressure monitor.  If you are between 29 years and 60 years old, ask your health care provider if you should take aspirin to prevent strokes.  Have regular diabetes screenings. This involves taking a blood sample to check your fasting blood sugar level.  If you are at a normal weight and have a low risk for diabetes, have this test once  every three years after 56 years of age.  If you are overweight and have a high risk for diabetes, consider being tested at a younger age or more often. Preventing infection Hepatitis B  If you have a higher risk for hepatitis B, you should be screened for this virus. You are considered at high risk for hepatitis B if:  You were born in a country where hepatitis B is common. Ask your health care provider which countries are considered high risk.  Your parents were born in a high-risk country, and you have not been immunized against hepatitis B (hepatitis B vaccine).  You have HIV or AIDS.  You use needles to inject street drugs.  You live with someone who has hepatitis B.  You have had sex with someone who has hepatitis B.  You get hemodialysis treatment.  You take certain medicines for conditions, including cancer, organ transplantation, and autoimmune conditions. Hepatitis C  Blood testing is recommended for:  Everyone born from [birth year] through 1965.  Anyone with known risk factors for hepatitis C. Sexually transmitted infections (STIs)  You should be screened for sexually transmitted infections (STIs) including gonorrhea and chlamydia if:  You are sexually active and are younger than 56 years of age.  You are older than 56 years of age and your health care provider tells you that you are at risk for this type of infection.  Your sexual activity has changed since you were last screened and you are at an increased risk for chlamydia or gonorrhea. Ask your health care provider if you are at risk.  If you do not have HIV, but are at risk, it may be recommended that you take a prescription medicine daily to prevent HIV infection. This is called pre-exposure prophylaxis (PrEP). You are considered at risk if:  You are sexually active and do not regularly use condoms or know the HIV status of your partner(s).  You take drugs by injection.  You are sexually active with a partner  who has HIV. Talk with your health care provider about whether you are at high risk of being infected with HIV. If you choose to begin PrEP, you should first be tested for HIV. You should then be tested every 3 months for as long as you are taking PrEP. Pregnancy  If you are premenopausal and you may become pregnant, ask your health care provider about preconception counseling.  If you may become pregnant, take 400 to 800 micrograms (mcg) of folic acid  every day.  If you want to prevent pregnancy, talk to your health care provider about birth control (contraception). Osteoporosis and menopause  Osteoporosis is a disease in which the bones lose minerals and strength with aging. This can result in serious bone fractures. Your risk for osteoporosis can be identified using a bone density scan.  If you are 4 years of age or older, or if you are at risk for osteoporosis and fractures, ask your health care provider if you should be screened.  Ask your health care provider whether you should take a calcium or vitamin D supplement to lower your risk for osteoporosis.  Menopause may have certain physical symptoms and risks.  Hormone replacement therapy may reduce some of these symptoms and risks. Talk to your health care provider about whether hormone replacement therapy is right for you. Follow these instructions at home:  Schedule regular health, dental, and eye exams.  Stay current with your immunizations.  Do not use any tobacco products including cigarettes, chewing tobacco, or electronic cigarettes.  If you are pregnant, do not drink alcohol.  If you are breastfeeding, limit how much and how often you drink alcohol.  Limit alcohol intake to no more than 1 drink per day for nonpregnant women. One drink equals 12 ounces of beer, 5 ounces of wine, or 1 ounces of hard liquor.  Do not use street drugs.  Do not share needles.  Ask your health care provider for help if you need support  or information about quitting drugs.  Tell your health care provider if you often feel depressed.  Tell your health care provider if you have ever been abused or do not feel safe at home. This information is not intended to replace advice given to you by your health care provider. Make sure you discuss any questions you have with your health care provider. Document Released: 10/28/2010 Document Revised: 09/20/2015 Document Reviewed: 01/16/2015 Elsevier Interactive Patient Education  2017 [employer].

## 2016-08-30 LAB — CMP14+EGFR
A/G RATIO: 1.7 (ref 1.2–2.2)
ALK PHOS: 68 IU/L (ref 39–117)
ALT: 13 IU/L (ref 0–32)
AST: 13 IU/L (ref 0–40)
Albumin: 4.4 g/dL (ref 3.5–5.5)
BUN / CREAT RATIO: 19 (ref 9–23)
BUN: 14 mg/dL (ref 6–24)
Bilirubin Total: 0.4 mg/dL (ref 0.0–1.2)
CHLORIDE: 102 mmol/L (ref 96–106)
CO2: 26 mmol/L (ref 18–29)
Calcium: 9.5 mg/dL (ref 8.7–10.2)
Creatinine, Ser: 0.72 mg/dL (ref 0.57–1.00)
GFR calc Af Amer: 109 mL/min/{1.73_m2} (ref >59)
GFR calc non Af Amer: 95 mL/min/{1.73_m2} (ref >59)
GLOBULIN, TOTAL: 2.6 g/dL (ref 1.5–4.5)
Glucose: 92 mg/dL (ref 65–99)
Potassium: 4.4 mmol/L (ref 3.5–5.2)
SODIUM: 141 mmol/L (ref 134–144)
Total Protein: 7 g/dL (ref 6.0–8.5)

## 2016-08-30 LAB — LIPID PANEL
CHOLESTEROL TOTAL: 183 mg/dL (ref 100–199)
Chol/HDL Ratio: 3.5 ratio (ref 0.0–4.4)
HDL: 52 mg/dL (ref >39)
LDL CALC: 114 mg/dL — AB (ref 0–99)
Triglycerides: 87 mg/dL (ref 0–149)
VLDL Cholesterol Cal: 17 mg/dL (ref 5–40)

## 2016-08-30 LAB — HIV ANTIBODY (ROUTINE TESTING W REFLEX): HIV Screen 4th Generation wRfx: NONREACTIVE

## 2016-09-01 ENCOUNTER — Telehealth: Payer: Self-pay | Admitting: *Deleted

## 2016-09-01 LAB — CYTOLOGY - PAP
Bacterial vaginitis: NEGATIVE
CANDIDA VAGINITIS: NEGATIVE
CHLAMYDIA, DNA PROBE: NEGATIVE
Diagnosis: NEGATIVE
HPV: NOT DETECTED
NEISSERIA GONORRHEA: NEGATIVE
TRICH (WINDOWPATH): NEGATIVE

## 2016-09-01 NOTE — Telephone Encounter (Signed)
Patient verified DOB Patient is aware of labs being normal and PAP being normal. Patient expressed her understanding and had no further questions at this time.

## 2016-09-01 NOTE — Telephone Encounter (Signed)
-----   Message from Arnoldo Morale, MD sent at 09/01/2016  3:42 PM EDT ----- Labs are normal; Pap smear is also normal.

## 2016-10-02 ENCOUNTER — Ambulatory Visit (HOSPITAL_COMMUNITY)
Admission: EM | Admit: 2016-10-02 | Discharge: 2016-10-02 | Disposition: A | Discharge status: Home / Self Care | Payer: Self-pay | Attending: Internal Medicine | Admitting: Internal Medicine

## 2016-10-02 ENCOUNTER — Encounter (HOSPITAL_COMMUNITY): Payer: Self-pay | Admitting: Emergency Medicine

## 2016-10-02 DIAGNOSIS — J029 Acute pharyngitis, unspecified: Secondary | ICD-10-CM

## 2016-10-02 DIAGNOSIS — J069 Acute upper respiratory infection, unspecified: Secondary | ICD-10-CM

## 2016-10-02 DIAGNOSIS — B9789 Other viral agents as the cause of diseases classified elsewhere: Secondary | ICD-10-CM

## 2016-10-02 MED ORDER — IPRATROPIUM BROMIDE 0.06 % NA SOLN: 2.0000 | Freq: Four times a day (QID) | NASAL | 0 refills | Status: DC

## 2016-10-02 NOTE — ED Provider Notes (Signed)
CSN: [id number]     Arrival date & time 10/02/16  1741 History   First MD Initiated Contact with Patient 10/02/16 1859     Chief Complaint  Patient presents with  . Sore Throat   (Consider location/radiation/quality/duration/timing/severity/associated sxs/prior Treatment) 56 year old female presents with a 2 day history of cough, breast for congestion, sore throat, headache, fatigue and malaise. She taken some ibuprofen but was sleeping she woke up she had been sweating. She is sitting that she was having a fever but this is undocumented.      Past Medical History:  Diagnosis Date  . Anxiety   . Chest pain 12/08/2013  . GERD (gastroesophageal reflux disease) 08/10/2014  . Hyperlipidemia   . Hypertension   . Murmur 12/08/2013   Past Surgical History:  Procedure Laterality Date  . ABDOMINAL HYSTERECTOMY    . ABDOMINAL SURGERY     Family History  Problem Relation Age of Onset  . Gallbladder disease Mother   . Heart disease Father   . Colon polyps Sister   . Diabetes Sister        x3  . Diabetes Brother   . Colon cancer Neg Hx   . Kidney disease Neg Hx   . Esophageal cancer Neg Hx    Social History  Substance Use Topics  . Smoking status: Never Smoker  . Smokeless tobacco: Never Used  . Alcohol use 0.6 - 1.2 oz/week    1 - 2 Cans of beer per week     Comment: occasional   OB History    No data available     Review of Systems  Constitutional: Negative for activity change, appetite change, chills, fatigue and fever.  HENT: Positive for congestion, postnasal drip, rhinorrhea and sore throat. Negative for facial swelling.   Eyes: Negative.   Respiratory: Negative.  Negative for choking and shortness of breath.   Cardiovascular: Negative.   Genitourinary: Negative.   Musculoskeletal: Negative for neck pain and neck stiffness.  Skin: Negative for pallor and rash.  Neurological: Negative.   All other systems reviewed and are negative.   Allergies   Amoxicillin  Home Medications   Prior to Admission medications   Medication Sig Start Date End Date Taking? Authorizing Provider  benazepril (LOTENSIN) 20 MG tablet Take 1 tablet (20 mg total) by mouth daily. 08/29/16  Yes Arnoldo Morale, MD  clonazePAM (KLONOPIN) 1 MG tablet Take 1 mg by mouth 2 (two) times daily.   Yes [provider]  ipratropium (ATROVENT) 0.06 % nasal spray Place 2 sprays into both nostrils 4 (four) times daily. For runny nose 10/02/16   Janne Napoleon, NP  nitroGLYCERIN (NITROSTAT) 0.4 MG SL tablet Place 1 tablet (0.4 mg total) under the tongue every 5 (five) minutes as needed. 01/25/14   Josue Hector, MD   Meds Ordered and Administered this Visit  Medications - No data to display  BP (!) 144/83 (BP Location: Right Arm)   Pulse 83   Temp 98.5 F (36.9 C) (Oral)   Resp 20   SpO2 99%  No data found.   Physical Exam  Constitutional: She is oriented to person, place, and time. She appears well-developed and well-nourished. No distress.  HENT:  Bilateral TMs are normal. Oropharynx with smooth. Mucosa. No swelling or exudates.  Eyes: EOM are normal.  Neck: Normal range of motion. Neck supple.  Cardiovascular: Normal rate, regular rhythm, normal heart sounds and intact distal pulses.   Pulmonary/Chest: Effort normal and breath sounds normal. No  respiratory distress. She has no wheezes. She has no rales.  Musculoskeletal: Normal range of motion. She exhibits no edema.  Lymphadenopathy:    She has no cervical adenopathy.  Neurological: She is alert and oriented to person, place, and time.  Skin: Skin is warm and dry. No rash noted.  Psychiatric: She has a normal mood and affect.  Nursing note and vitals reviewed.   Urgent Care Course     Procedures (including critical care time)  Labs Review Labs Reviewed - No data to display  Imaging Review No results found.   Visual Acuity Review  Right Eye Distance:   Left Eye Distance:   Bilateral  Distance:    Right Eye Near:   Left Eye Near:    Bilateral Near:         MDM   1. Viral URI with cough   2. Viral pharyngitis    Sudafed PE 10 mg every 4 to 6 hours as needed for congestion Allegra or Zyrtec daily as needed for drainage and runny nose. For stronger antihistamine may take Chlor-Trimeton 2 to 4 mg every 4 to 6 hours, may cause drowsiness. Saline nasal spray used frequently. Drink plenty of fluids and stay well-hydrated. Flonase or Rhinocort nasal spray daily     Janne Napoleon, NP 10/02/16 1945

## 2016-10-02 NOTE — Discharge Instructions (Signed)
Sudafed PE 10 mg every 4 to 6 hours as needed for congestion °Allegra or Zyrtec daily as needed for drainage and runny nose. °For stronger antihistamine may take Chlor-Trimeton 2 to 4 mg every 4 to 6 hours, may cause drowsiness. °Saline nasal spray used frequently. °Drink plenty of fluids and stay well-hydrated. °Flonase or Rhinocort nasal spray daily °

## 2016-10-02 NOTE — ED Triage Notes (Signed)
Pt here for ST onset 3 days associated w/HA, nasal congestion, cough   Taking Ibup w/temp relief  A&O x4... NAD... Ambulatory

## 2016-11-10 ENCOUNTER — Ambulatory Visit: Payer: Self-pay | Attending: Family Medicine

## 2016-11-12 ENCOUNTER — Ambulatory Visit: Payer: Self-pay

## 2016-12-09 ENCOUNTER — Other Ambulatory Visit: Payer: Self-pay | Admitting: Family Medicine

## 2016-12-09 DIAGNOSIS — I10 Essential (primary) hypertension: Secondary | ICD-10-CM

## 2016-12-18 ENCOUNTER — Telehealth: Payer: Self-pay | Admitting: Family Medicine

## 2016-12-18 DIAGNOSIS — K009 Disorder of tooth development, unspecified: Secondary | ICD-10-CM

## 2016-12-18 NOTE — Telephone Encounter (Signed)
Pt called to request a referral to a dentist, need deep cleaning and possible tooth removal please sent it to the dentist Please follow up,  Pt will call back to give [place] the dentist information where  she want to go

## 2016-12-19 NOTE — Telephone Encounter (Signed)
Referral placed.

## 2017-01-12 MED FILL — BENAZEPRIL HCL 20 MG TABLET: 20 | 30 days supply | Qty: 30 | Fill #0

## 2017-04-09 ENCOUNTER — Encounter (HOSPITAL_COMMUNITY): Payer: Self-pay | Admitting: Emergency Medicine

## 2017-04-09 ENCOUNTER — Other Ambulatory Visit: Payer: Self-pay

## 2017-04-09 DIAGNOSIS — I10 Essential (primary) hypertension: Secondary | ICD-10-CM | POA: Insufficient documentation

## 2017-04-09 DIAGNOSIS — Z79899 Other long term (current) drug therapy: Secondary | ICD-10-CM | POA: Insufficient documentation

## 2017-04-09 DIAGNOSIS — M7732 Calcaneal spur, left foot: Secondary | ICD-10-CM | POA: Insufficient documentation

## 2017-04-09 NOTE — ED Triage Notes (Signed)
Pt c/o 6/10 left ankle and heel pain for the past 4 months getting worse. Pt states is worse when she wakes up in the morning or after she sits down.

## 2017-04-10 ENCOUNTER — Emergency Department (HOSPITAL_COMMUNITY)
Admission: EM | Admit: 2017-04-10 | Discharge: 2017-04-10 | Disposition: A | Discharge status: Home / Self Care | Payer: Self-pay | Attending: Emergency Medicine | Admitting: Emergency Medicine

## 2017-04-10 ENCOUNTER — Emergency Department (HOSPITAL_COMMUNITY): Payer: Self-pay

## 2017-04-10 DIAGNOSIS — M79672 Pain in left foot: Secondary | ICD-10-CM

## 2017-04-10 DIAGNOSIS — M7732 Calcaneal spur, left foot: Secondary | ICD-10-CM

## 2017-04-10 IMAGING — DX DG FOOT COMPLETE 3+V*L*
3 series · 3 of 3 positions shown · non-contrast
Comparison: None.

CLINICAL DATA: Left foot pain for 4 months, nontraumatic

EXAM:
LEFT FOOT - COMPLETE 3+ VIEW

[foot ap]
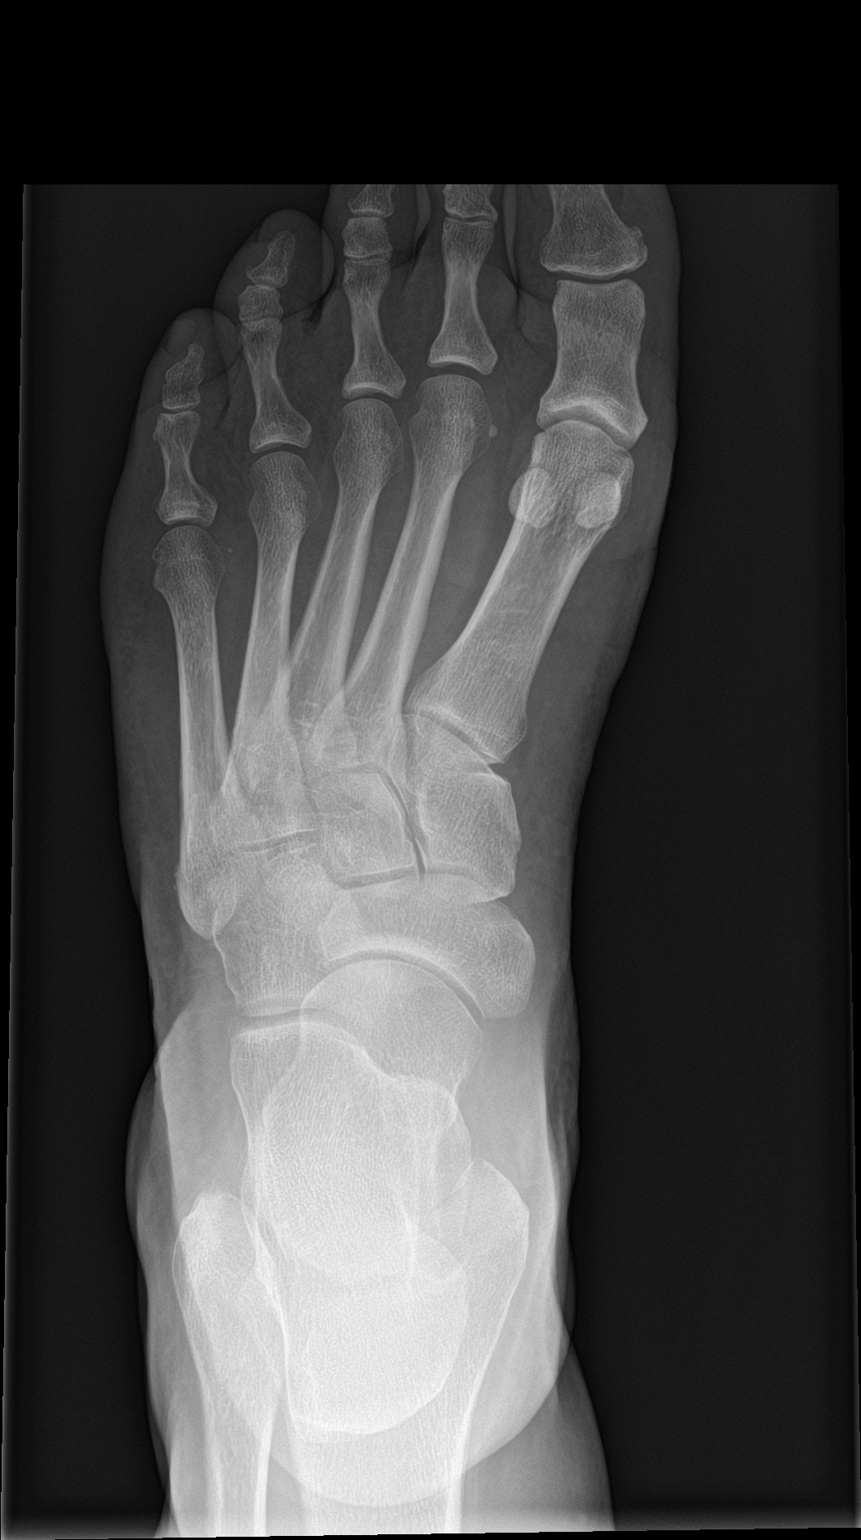

[foot obl]
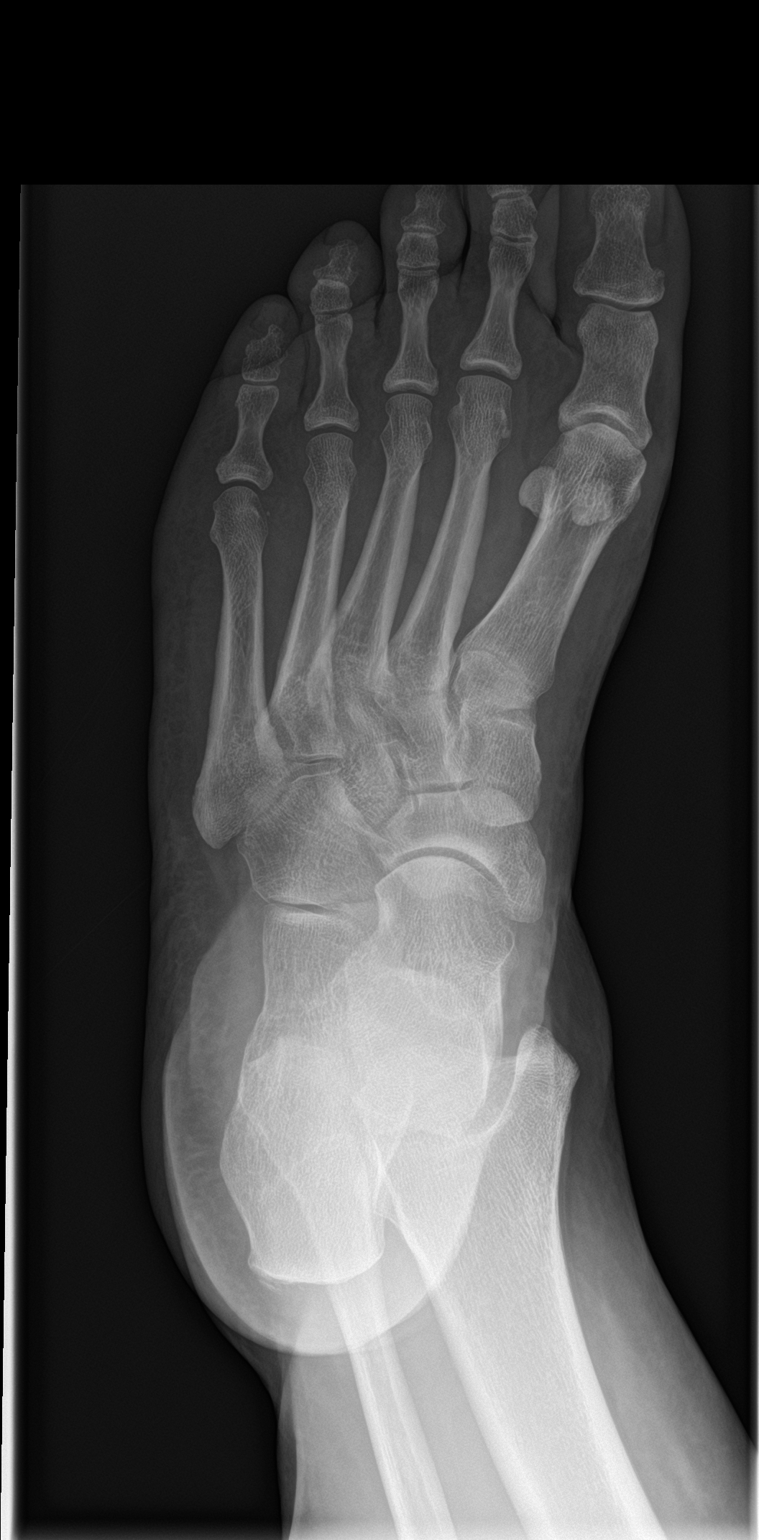

[foot lat]
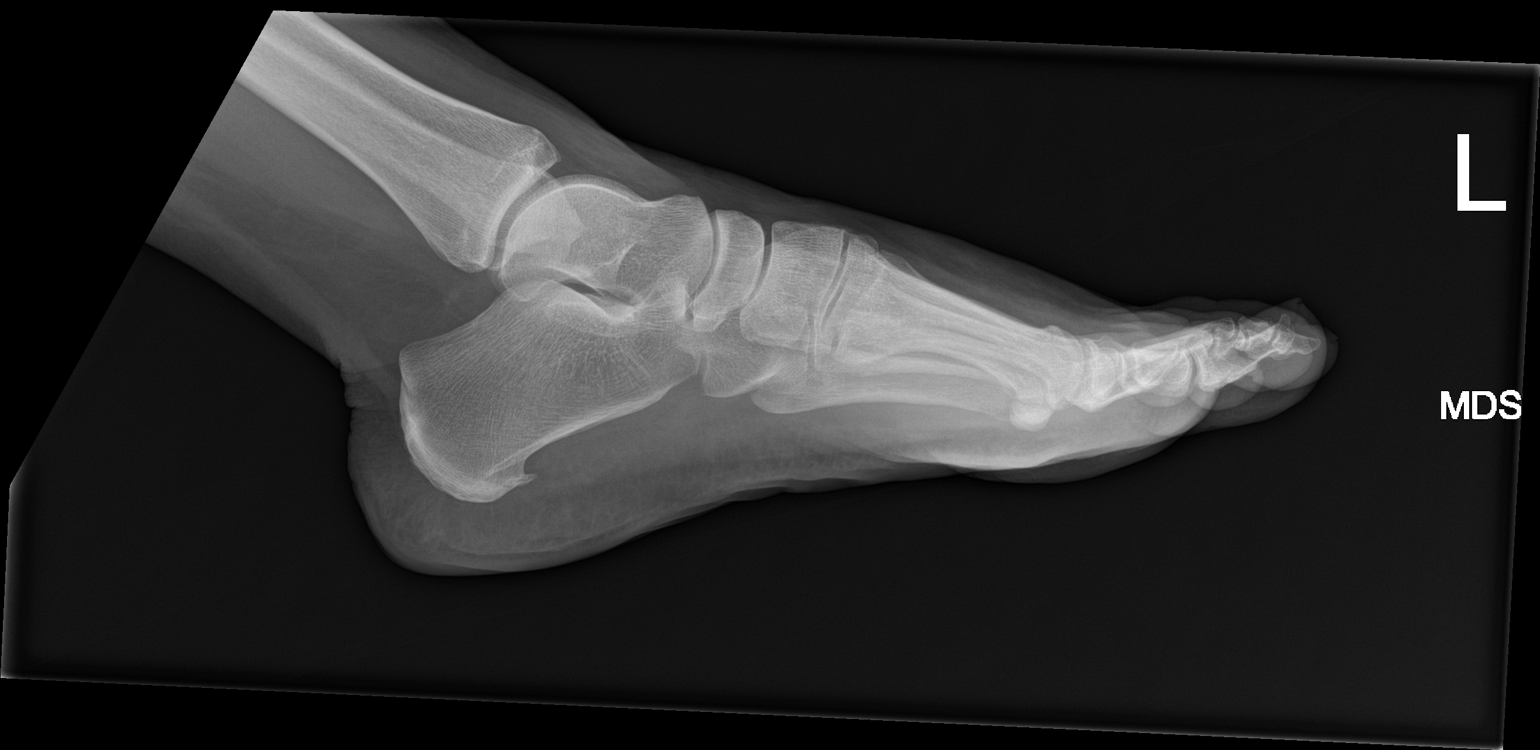

[3 of 3 positions shown; findings below may reference images not displayed]

FINDINGS: There is no evidence of fracture or dislocation. Mild arthritic
changes at the first MTP joint. Good preservation of the remainder
of the articulations of the foot. Small plantar calcaneal spur. Soft
tissues are unremarkable.
IMPRESSION: No acute findings. Mild first MTP degenerative changes. Small
plantar calcaneal spur.

## 2017-04-10 MED FILL — BENAZEPRIL HCL 20 MG TABLET: 20 | 30 days supply | Qty: 30 | Fill #1

## 2017-04-10 NOTE — Discharge Instructions (Signed)
Ibuprofen 600 mg 3 times daily for the next week.  Follow-up with podiatry.  The contact information for the [facility] podiatry clinic has been provided in this discharge summary for you to call and make these arrangements.

## 2017-04-10 NOTE — ED Provider Notes (Signed)
[facility] EMERGENCY DEPARTMENT Provider Note   CSN: [id number] Arrival date & time: 04/09/17  2314     History   Chief Complaint Chief Complaint  Patient presents with  . Ankle Pain    HPI Christina Burke is a 56 y.o. female.  Patient is a 56 year old female with past medical history of reflux and anxiety presenting for evaluation of left foot pain.  This is been ongoing for several months.  She denies a specific injury or trauma.  Her pain is worse in the morning and seems to improve somewhat throughout the day.  Her pain is located on the bottom of the left heel.   The history is provided by the patient.  Ankle Pain   Incident onset: Several months ago. There was no injury mechanism. The pain is present in the left foot. The quality of the pain is described as sharp. The pain is severe. The pain has been constant since onset. Pertinent negatives include no numbness.    Past Medical History:  Diagnosis Date  . Anxiety   . Chest pain 12/08/2013  . GERD (gastroesophageal reflux disease) 08/10/2014  . Hyperlipidemia   . Hypertension   . Murmur 12/08/2013    Patient Active Problem List   Diagnosis Date Noted  . Hypertension   . Anxiety   . Hyperlipidemia   . GERD (gastroesophageal reflux disease) 08/10/2014  . HTN (hypertension) 01/25/2014  . Chest pain 12/08/2013  . Murmur 12/08/2013    Past Surgical History:  Procedure Laterality Date  . ABDOMINAL HYSTERECTOMY    . ABDOMINAL SURGERY      OB History    No data available       Home Medications    Prior to Admission medications   Medication Sig Start Date End Date Taking? Authorizing Provider  benazepril (LOTENSIN) 20 MG tablet Take 1 tablet (20 mg total) by mouth daily. 08/29/16   Arnoldo Morale, MD  clonazePAM (KLONOPIN) 1 MG tablet Take 1 mg by mouth 2 (two) times daily.    [provider]  ipratropium (ATROVENT) 0.06 % nasal spray Place 2 sprays into both nostrils 4 (four)  times daily. For runny nose 10/02/16   Janne Napoleon, NP  nitroGLYCERIN (NITROSTAT) 0.4 MG SL tablet Place 1 tablet (0.4 mg total) under the tongue every 5 (five) minutes as needed. 01/25/14   Josue Hector, MD    Family History Family History  Problem Relation Age of Onset  . Gallbladder disease Mother   . Heart disease Father   . Colon polyps Sister   . Diabetes Sister        x3  . Diabetes Brother   . Colon cancer Neg Hx   . Kidney disease Neg Hx   . Esophageal cancer Neg Hx     Social History Social History   Tobacco Use  . Smoking status: Never Smoker  . Smokeless tobacco: Never Used  Substance Use Topics  . Alcohol use: Yes    Alcohol/week: 0.6 - 1.2 oz    Types: 1 - 2 Cans of beer per week    Comment: occasional  . Drug use: No     Allergies   Amoxicillin   Review of Systems Review of Systems  Neurological: Negative for numbness.  All other systems reviewed and are negative.    Physical Exam Updated Vital Signs BP (!) 154/104 (BP Location: Right Arm)   Pulse 78   Temp 98 F (36.7 C) (Oral)   Resp  16   Ht 5\' 3"  (1.6 m)   Wt 95.3 kg (210 lb)   SpO2 100%   BMI 37.20 kg/m   Physical Exam  Constitutional: She is oriented to person, place, and time. She appears well-developed and well-nourished. No distress.  HENT:  Head: Normocephalic and atraumatic.  Neck: Normal range of motion. Neck supple.  Musculoskeletal:  The left foot appears grossly normal.  There is tenderness to palpation over the plantar surface of the heel.  Neurological: She is alert and oriented to person, place, and time.  Skin: Skin is warm and dry. She is not diaphoretic.  Nursing note and vitals reviewed.    ED Treatments / Results  Labs (all labs ordered are listed, but only abnormal results are displayed) Labs Reviewed - No data to display  EKG  EKG Interpretation None       Radiology Dg Foot Complete Left  Result Date: 04/10/2017 CLINICAL DATA:  Left foot pain  for 4 months, nontraumatic EXAM: LEFT FOOT - COMPLETE 3+ VIEW COMPARISON:  None. FINDINGS: There is no evidence of fracture or dislocation. Mild arthritic changes at the first MTP joint. Good preservation of the remainder of the articulations of the foot. Small plantar calcaneal spur. Soft tissues are unremarkable. IMPRESSION: No acute findings. Mild first MTP degenerative changes. Small plantar calcaneal spur. Electronically Signed   By: Andreas Newport M.D.   On: 04/10/2017 01:21    Procedures Procedures (including critical care time)  Medications Ordered in ED Medications - No data to display   Initial Impression / Assessment and Plan / ED Course  I have reviewed the triage vital signs and the nursing notes.  Pertinent labs & imaging results that were available during my care of the patient were reviewed by me and considered in my medical decision making (see chart for details).  X-rays reveal a spur of the calcaneus, however no other obvious abnormalities.  I suspect that this is likely the cause of her pain.  I will recommend anti-inflammatories, limited weightbearing, and follow-up with podiatry.  Final Clinical Impressions(s) / ED Diagnoses   Final diagnoses:  None    ED Discharge Orders    None       Veryl Speak, MD 04/10/17 [phone]

## 2017-04-13 ENCOUNTER — Ambulatory Visit: Payer: Self-pay | Admitting: Podiatry

## 2017-04-14 ENCOUNTER — Ambulatory Visit: Payer: Self-pay

## 2017-04-29 ENCOUNTER — Encounter: Payer: Self-pay | Admitting: Podiatry

## 2017-04-29 ENCOUNTER — Ambulatory Visit (INDEPENDENT_AMBULATORY_CARE_PROVIDER_SITE_OTHER): Payer: No Typology Code available for payment source | Admitting: Podiatry

## 2017-04-29 DIAGNOSIS — M722 Plantar fascial fibromatosis: Secondary | ICD-10-CM

## 2017-04-29 NOTE — Progress Notes (Signed)
Subjective:   Patient ID: Christina Burke, female   DOB: 57 y.o.   MRN: [id number]   HPI Patient presents stating she has had pain in the bottom of the left heel and discoloration of the right foot for years   Review of Systems  All other systems reviewed and are negative.       Objective:  Physical Exam  Constitutional: She appears well-developed and well-nourished.  Cardiovascular: Intact distal pulses.  Pulmonary/Chest: Effort normal.  Musculoskeletal: Normal range of motion.  Neurological: She is alert.  Skin: Skin is warm.  Nursing note and vitals reviewed.   Neurovascular status intact with patient's left heel being very tender and skin irritation of the right foot     Assessment:  Acute plantar fasciitis left with skin irritation right     Plan:  H&P reviewed previous x-rays and injected the left plantar fascia 3 mg Kenalog 5 mg Xylocaine and advised on ways to try to handle dry skin on the right foot

## 2017-04-29 NOTE — Progress Notes (Signed)
   Subjective:    Patient ID: Christina Burke, female    DOB: 24-Feb-1961, 57 y.o.   MRN: [id number]  HPI    Review of Systems  All other systems reviewed and are negative.      Objective:   Physical Exam        Assessment & Plan:

## 2017-05-13 ENCOUNTER — Telehealth: Payer: Self-pay | Admitting: *Deleted

## 2017-05-13 NOTE — Telephone Encounter (Signed)
I spoke with pt and told her that with plantar fasciitis, that Dr. Paulla Dolly wanted to see pt after 2 weeks especially since she was still having pain. I told pt I would transfer to schedulers, and until she was seen she should be aggressive with ice therapy, ice the area 3-4 times a day for 15-20 minutes each session protecting the skin from the ice with fabric. Pt states understanding and I transferred to schedulers.

## 2017-05-13 NOTE — Telephone Encounter (Signed)
Pt states she was seen about 2 weeks ago, and received an injection for plantar fasciitis and it is still hurting.

## 2017-05-14 ENCOUNTER — Ambulatory Visit: Payer: No Typology Code available for payment source | Admitting: Podiatry

## 2017-05-14 ENCOUNTER — Encounter: Payer: Self-pay | Admitting: Podiatry

## 2017-05-14 DIAGNOSIS — M7662 Achilles tendinitis, left leg: Secondary | ICD-10-CM

## 2017-05-14 DIAGNOSIS — M722 Plantar fascial fibromatosis: Secondary | ICD-10-CM

## 2017-05-14 MED ORDER — TRIAMCINOLONE ACETONIDE 10 MG/ML IJ SUSP
10.0000 mg | Freq: Once | INTRAMUSCULAR | Status: AC
  Administered 2017-05-14: 10 mg

## 2017-05-14 NOTE — Progress Notes (Signed)
Subjective:   Patient ID: Christina Burke, female   DOB: 57 y.o.   MRN: [id number]   HPI Patient presents stating my heel is feeling some better but I am getting pain in the back of the heel but I wanted to get checked with seems to be a different problem   ROS      Objective:  Physical Exam  Neurovascular status intact with pain in the plantar heel still present but improved from previous visit with moderate pain in the Achilles tendon left plantar fasciitis left improved but present with Achilles tendinitis left     Assessment:  Achilles tendinitis and plantar fasciitis     Plan:  Reviewed both conditions and discussed them separately recommended stretching and physical therapy and heel lift for the Achilles tendon and reinjected the plantar fascia 3 mg Kenalog 5 mg Xylocaine

## 2017-06-03 MED FILL — BENAZEPRIL HCL 20 MG TABLET: 20 | 30 days supply | Qty: 30 | Fill #2

## 2017-08-17 MED FILL — BENAZEPRIL HCL 20 MG TABLET: 20 | 30 days supply | Qty: 30 | Fill #3

## 2017-09-23 ENCOUNTER — Encounter (HOSPITAL_COMMUNITY): Payer: Self-pay | Admitting: Emergency Medicine

## 2017-09-23 ENCOUNTER — Ambulatory Visit (HOSPITAL_COMMUNITY)
Admission: EM | Admit: 2017-09-23 | Discharge: 2017-09-23 | Disposition: A | Discharge status: Home / Self Care | Payer: Medicaid Other | Attending: Family Medicine | Admitting: Family Medicine

## 2017-09-23 DIAGNOSIS — I10 Essential (primary) hypertension: Secondary | ICD-10-CM | POA: Insufficient documentation

## 2017-09-23 DIAGNOSIS — Z79899 Other long term (current) drug therapy: Secondary | ICD-10-CM | POA: Insufficient documentation

## 2017-09-23 DIAGNOSIS — N898 Other specified noninflammatory disorders of vagina: Secondary | ICD-10-CM

## 2017-09-23 DIAGNOSIS — R822 Biliuria: Secondary | ICD-10-CM | POA: Insufficient documentation

## 2017-09-23 DIAGNOSIS — R300 Dysuria: Secondary | ICD-10-CM

## 2017-09-23 DIAGNOSIS — N390 Urinary tract infection, site not specified: Secondary | ICD-10-CM | POA: Insufficient documentation

## 2017-09-23 DIAGNOSIS — Z880 Allergy status to penicillin: Secondary | ICD-10-CM | POA: Insufficient documentation

## 2017-09-23 DIAGNOSIS — E785 Hyperlipidemia, unspecified: Secondary | ICD-10-CM | POA: Insufficient documentation

## 2017-09-23 LAB — COMPREHENSIVE METABOLIC PANEL
ALT: 16 U/L (ref 14–54)
ANION GAP: 8 (ref 5–15)
AST: 16 U/L (ref 15–41)
Albumin: 4.2 g/dL (ref 3.5–5.0)
Alkaline Phosphatase: 68 U/L (ref 38–126)
BUN: 13 mg/dL (ref 6–20)
CHLORIDE: 103 mmol/L (ref 101–111)
CO2: 28 mmol/L (ref 22–32)
Calcium: 9.7 mg/dL (ref 8.9–10.3)
Creatinine, Ser: 0.83 mg/dL (ref 0.44–1.00)
Glucose, Bld: 101 mg/dL — ABNORMAL HIGH (ref 65–99)
Potassium: 4.2 mmol/L (ref 3.5–5.1)
SODIUM: 139 mmol/L (ref 135–145)
Total Bilirubin: 0.8 mg/dL (ref 0.3–1.2)
Total Protein: 7.1 g/dL (ref 6.5–8.1)

## 2017-09-23 LAB — POCT URINALYSIS DIP (DEVICE)
Glucose, UA: NEGATIVE mg/dL
HGB URINE DIPSTICK: NEGATIVE
Ketones, ur: 15 mg/dL — AB
Leukocytes, UA: NEGATIVE
NITRITE: NEGATIVE
PH: 6 (ref 5.0–8.0)
PROTEIN: 30 mg/dL — AB
Specific Gravity, Urine: 1.025 (ref 1.005–1.030)
UROBILINOGEN UA: 1 mg/dL (ref 0.0–1.0)

## 2017-09-23 NOTE — ED Provider Notes (Signed)
[facility]    CSN: [id number] Arrival date & time: 09/23/17  1532     History   Chief Complaint Chief Complaint  Patient presents with  . Urinary Tract Infection    HPI Christina Burke is a 57 y.o. female.   57 year old female comes in for 2-day history of generalized abdominal pain, vaginal discharge, dysuria.  States because of generalized abdominal pain that is "sore" in nature.  No obvious aggravating or alleviating factor.  She has been eating and drinking without problems.  Has had some nausea without vomiting.  Denies fever, chills, night sweats.  Does have some vaginal discharge without itching or pain.  Has dysuria without frequency, urgency, hematuria.  States she is sexually active, recently switched partners because past partner was cheating and would like STD testing.  Her abdominal surgeries include hysterectomy.     Past Medical History:  Diagnosis Date  . Anxiety   . Chest pain 12/08/2013  . GERD (gastroesophageal reflux disease) 08/10/2014  . Hyperlipidemia   . Hypertension   . Murmur 12/08/2013    Patient Active Problem List   Diagnosis Date Noted  . Hypertension   . Anxiety   . Hyperlipidemia   . GERD (gastroesophageal reflux disease) 08/10/2014  . HTN (hypertension) 01/25/2014  . Chest pain 12/08/2013  . Murmur 12/08/2013    Past Surgical History:  Procedure Laterality Date  . ABDOMINAL HYSTERECTOMY    . ABDOMINAL SURGERY      OB History   None      Home Medications    Prior to Admission medications   Medication Sig Start Date End Date Taking? Authorizing Provider  benazepril (LOTENSIN) 20 MG tablet Take 1 tablet (20 mg total) by mouth daily. 08/29/16   Charlott Rakes, MD  clonazePAM (KLONOPIN) 1 MG tablet Take 1 mg by mouth 2 (two) times daily.    [provider]  ipratropium (ATROVENT) 0.06 % nasal spray Place 2 sprays into both nostrils 4 (four) times daily. For runny nose 10/02/16   Janne Napoleon, NP  nitroGLYCERIN  (NITROSTAT) 0.4 MG SL tablet Place 1 tablet (0.4 mg total) under the tongue every 5 (five) minutes as needed. 01/25/14   Josue Hector, MD    Family History Family History  Problem Relation Age of Onset  . Gallbladder disease Mother   . Heart disease Father   . Colon polyps Sister   . Diabetes Sister        x3  . Diabetes Brother   . Colon cancer Neg Hx   . Kidney disease Neg Hx   . Esophageal cancer Neg Hx     Social History Social History   Tobacco Use  . Smoking status: Never Smoker  . Smokeless tobacco: Never Used  Substance Use Topics  . Alcohol use: Yes    Alcohol/week: 0.6 - 1.2 oz    Types: 1 - 2 Cans of beer per week    Comment: occasional  . Drug use: No     Allergies   Amoxicillin   Review of Systems Review of Systems  Reason unable to perform ROS: See HPI as above.     Physical Exam Triage Vital Signs ED Triage Vitals [09/23/17 1559]  Enc Vitals Group     BP 124/68     Pulse Rate 80     Resp 18     Temp 98.6 F (37 C)     Temp Source Oral     SpO2 96 %  Weight      Height      Head Circumference      Peak Flow      Pain Score      Pain Loc      Pain Edu?      Excl. in [place]?    No data found.  Updated Vital Signs BP 124/68 (BP Location: Left Arm)   Pulse 80   Temp 98.6 F (37 C) (Oral)   Resp 18   SpO2 96%   Physical Exam  Constitutional: She is oriented to person, place, and time. She appears well-developed and well-nourished. No distress.  HENT:  Head: Normocephalic and atraumatic.  Eyes: Pupils are equal, round, and reactive to light. Conjunctivae are normal. No scleral icterus.  Cardiovascular: Normal rate and regular rhythm. Exam reveals no gallop and no friction rub.  Murmur heard. Pulmonary/Chest: Effort normal and breath sounds normal. She has no wheezes. She has no rales.  Abdominal: Soft. Bowel sounds are normal. She exhibits no mass. There is no tenderness. There is no rebound, no guarding and no CVA tenderness.    Neurological: She is alert and oriented to person, place, and time.  Skin: Skin is warm and dry.  Psychiatric: She has a normal mood and affect. Her behavior is normal. Judgment normal.     UC Treatments / Results  Labs (all labs ordered are listed, but only abnormal results are displayed) Labs Reviewed  COMPREHENSIVE METABOLIC PANEL - Abnormal; Notable for the following components:      Result Value   Glucose, Bld 101 (*)    All other components within normal limits  POCT URINALYSIS DIP (DEVICE) - Abnormal; Notable for the following components:   Bilirubin Urine MODERATE (*)    Ketones, ur 15 (*)    Protein, ur 30 (*)    All other components within normal limits  CERVICOVAGINAL ANCILLARY ONLY    EKG None  Radiology No results found.  Procedures Procedures (including critical care time)  Medications Ordered in UC Medications - No data to display  Initial Impression / Assessment and Plan / UC Course  I have reviewed the triage vital signs and the nursing notes.  Pertinent labs & imaging results that were available during my care of the patient were reviewed by me and considered in my medical decision making (see chart for details).    No alarming signs on exam.  CMP within normal limits. Cytology sent, patient will be contacted with any positive results that require additional treatment. Patient to refrain from sexual activity for the next 7 days. Patient to continue to monitor symptoms. Return precautions given.   Final Clinical Impressions(s) / UC Diagnoses   Final diagnoses:  Vaginal discharge  Bilirubinuria   ED Prescriptions    None        Ok Edwards, PA-C 09/24/17 1015

## 2017-09-23 NOTE — ED Triage Notes (Signed)
Pt sts UTI sx x 2 days

## 2017-09-23 NOTE — Discharge Instructions (Signed)
No alarming signs on exam.  Urine negative for urinary tract infection.  As discussed, there was bilirubin in your urine, we have drawn blood work to check for your liver function. Cytology sent, you will be contacted with any positive results that requires further treatment. Refrain from sexual activity and alcohol use for the next 7 days.  If experiencing worsening abdominal pain, nausea, vomiting, unable to walk/jump up and down due to abdominal pain, go to the emergency department for further evaluation.  Otherwise follow-up with PCP for further evaluation of abdominal pain does not resolve.

## 2017-09-24 LAB — CERVICOVAGINAL ANCILLARY ONLY
Bacterial vaginitis: NEGATIVE
CANDIDA VAGINITIS: NEGATIVE
Chlamydia: NEGATIVE
Neisseria Gonorrhea: NEGATIVE
Trichomonas: NEGATIVE

## 2017-10-21 ENCOUNTER — Ambulatory Visit: Payer: Self-pay | Attending: Family Medicine

## 2017-11-17 ENCOUNTER — Encounter: Payer: Self-pay | Admitting: Family Medicine

## 2017-11-17 ENCOUNTER — Other Ambulatory Visit (HOSPITAL_COMMUNITY)
Admission: RE | Admit: 2017-11-17 | Discharge: 2017-11-17 | Disposition: A | Discharge status: Home / Self Care | Payer: Medicaid Other | Source: Ambulatory Visit | Attending: Family Medicine | Admitting: Family Medicine

## 2017-11-17 ENCOUNTER — Ambulatory Visit: Payer: Medicaid Other | Attending: Family Medicine | Admitting: Family Medicine

## 2017-11-17 VITALS — BP 113/72 | HR 71 | Temp 98.2°F | Wt 191.4 lb

## 2017-11-17 DIAGNOSIS — Z124 Encounter for screening for malignant neoplasm of cervix: Secondary | ICD-10-CM | POA: Diagnosis present

## 2017-11-17 DIAGNOSIS — I10 Essential (primary) hypertension: Secondary | ICD-10-CM | POA: Insufficient documentation

## 2017-11-17 DIAGNOSIS — Z880 Allergy status to penicillin: Secondary | ICD-10-CM | POA: Insufficient documentation

## 2017-11-17 DIAGNOSIS — Z308 Encounter for other contraceptive management: Secondary | ICD-10-CM

## 2017-11-17 DIAGNOSIS — E785 Hyperlipidemia, unspecified: Secondary | ICD-10-CM | POA: Insufficient documentation

## 2017-11-17 DIAGNOSIS — F419 Anxiety disorder, unspecified: Secondary | ICD-10-CM | POA: Insufficient documentation

## 2017-11-17 DIAGNOSIS — Z90710 Acquired absence of both cervix and uterus: Secondary | ICD-10-CM | POA: Insufficient documentation

## 2017-11-17 DIAGNOSIS — Z1239 Encounter for other screening for malignant neoplasm of breast: Secondary | ICD-10-CM

## 2017-11-17 DIAGNOSIS — Z0000 Encounter for general adult medical examination without abnormal findings: Secondary | ICD-10-CM

## 2017-11-17 DIAGNOSIS — L570 Actinic keratosis: Secondary | ICD-10-CM | POA: Insufficient documentation

## 2017-11-17 DIAGNOSIS — K219 Gastro-esophageal reflux disease without esophagitis: Secondary | ICD-10-CM | POA: Insufficient documentation

## 2017-11-17 DIAGNOSIS — Z79899 Other long term (current) drug therapy: Secondary | ICD-10-CM | POA: Insufficient documentation

## 2017-11-17 DIAGNOSIS — N644 Mastodynia: Secondary | ICD-10-CM

## 2017-11-17 NOTE — Progress Notes (Signed)
Subjective:  Patient ID: Christina Burke, female    DOB: [birth year]  Age: 57 y.o. MRN: [id number]  CC: Annual Exam and Gynecologic Exam   HPI [name] presents for a complete physical exam.  She is status post hysterectomy due to ?early uterine cancer and she would like to have a Pap smear. Her cervix is absent and she had a normal Pap smear in 08/2016. She complains of intermittent left breast pain but denies presence of lumps a family history of breast cancer. She would also like referral to dermatologist for an annual skin exam as she has a couple of moles on her skin.  Past Medical History:  Diagnosis Date  . Anxiety   . Chest pain 12/08/2013  . GERD (gastroesophageal reflux disease) 08/10/2014  . Hyperlipidemia   . Hypertension   . Murmur 12/08/2013    Past Surgical History:  Procedure Laterality Date  . ABDOMINAL HYSTERECTOMY    . ABDOMINAL SURGERY      Allergies  Allergen Reactions  . Amoxicillin Rash    Has patient had a PCN reaction causing immediate rash, facial/tongue/throat swelling, SOB or lightheadedness with hypotension: yes Has patient had a PCN reaction causing severe rash involving mucus membranes or skin necrosis: no Has patient had a PCN reaction that required hospitalization: no Has patient had a PCN reaction occurring within the last 10 years: no If all of the above answers are "NO", then may proceed with Cephalosporin use.      Outpatient Medications Prior to Visit  Medication Sig Dispense Refill  . benazepril (LOTENSIN) 20 MG tablet Take 1 tablet (20 mg total) by mouth daily. 30 tablet 6  . clonazePAM (KLONOPIN) 1 MG tablet Take 1 mg by mouth 2 (two) times daily.    Marland Kitchen ipratropium (ATROVENT) 0.06 % nasal spray Place 2 sprays into both nostrils 4 (four) times daily. For runny nose (Patient not taking: Reported on 11/17/2017) 15 mL 0  . nitroGLYCERIN (NITROSTAT) 0.4 MG SL tablet Place 1 tablet (0.4 mg total) under the tongue every 5 (five) minutes  as needed. (Patient not taking: Reported on 11/17/2017) 25 tablet 3   No facility-administered medications prior to visit.     ROS Review of Systems  Constitutional: Negative for activity change, appetite change and fatigue.  HENT: Negative for congestion, sinus pressure and sore throat.   Eyes: Negative for visual disturbance.  Respiratory: Negative for cough, chest tightness, shortness of breath and wheezing.   Cardiovascular: Negative for chest pain and palpitations.  Gastrointestinal: Negative for abdominal distention, abdominal pain and constipation.  Endocrine: Negative for polydipsia.  Genitourinary: Negative for dysuria and frequency.  Musculoskeletal: Negative for arthralgias and back pain.  Skin:       See hpi  Neurological: Negative for tremors, light-headedness and numbness.  Hematological: Does not bruise/bleed easily.  Psychiatric/Behavioral: Negative for agitation and behavioral problems.    Objective:  BP 113/72   Pulse 71   Temp 98.2 F (36.8 C) (Oral)   Wt 191 lb 6.4 oz (86.8 kg)   SpO2 99%   BMI 33.90 kg/m   BP/Weight 11/17/2017 09/23/2017 52/77/8242  Systolic BP 353 614 431  Diastolic BP 72 68 540  Wt. (Lbs) 191.4 - -  BMI 33.9 - 37.2      Physical Exam  Constitutional: She is oriented to person, place, and time. She appears well-developed and well-nourished. No distress.  HENT:  Head: Normocephalic.  Right Ear: External ear normal.  Left Ear: External ear normal.  Nose: Nose normal.  Mouth/Throat: Oropharynx is clear and moist.  Eyes: Pupils are equal, round, and reactive to light. Conjunctivae and EOM are normal.  Neck: Normal range of motion. No JVD present.  Cardiovascular: Normal rate, regular rhythm, normal heart sounds and intact distal pulses. Exam reveals no gallop.  No murmur heard. Pulmonary/Chest: Effort normal and breath sounds normal. No respiratory distress. She has no wheezes. She has no rales. She exhibits no tenderness. Right  breast exhibits no mass and no tenderness. Left breast exhibits no mass and no tenderness.  Abdominal: Soft. Bowel sounds are normal. She exhibits no distension and no mass. There is no tenderness.  Genitourinary:  Genitourinary Comments: Normal external genitalia, normal vagina, cervix is absent.  Normal adnexa.  Musculoskeletal: Normal range of motion. She exhibits no edema or tenderness.  Neurological: She is alert and oriented to person, place, and time. She has normal reflexes.  Skin: Skin is warm and dry. She is not diaphoretic.  Actinic keratosis on face and wart on upper right back  Psychiatric: She has a normal mood and affect.     CMP Latest Ref Rng & Units 09/23/2017 08/29/2016 08/04/2016  Glucose 65 - 99 mg/dL 101(H) 92 90  BUN 6 - 20 mg/dL _0 Creatinine 0.44 - 1.00 mg/dL 0.83 0.72 0.73  Sodium 135 - 145 mmol/L 139 141 142  Potassium 3.5 - 5.1 mmol/L 4.2 4.4 4.2  Chloride 101 - 111 mmol/L 103 102 100  CO2 22 - 32 mmol/L _1 Calcium 8.9 - 10.3 mg/dL 9.7 9.5 9.5  Total Protein 6.5 - 8.1 g/dL 7.1 7.0 7.2  Total Bilirubin 0.3 - 1.2 mg/dL 0.8 0.4 0.3  Alkaline Phos 38 - 126 U/L 68 68 78  AST 15 - 41 U/L _2 ALT 14 - 54 U/L _3 Lipid Panel     Component Value Date/Time   CHOL 183 08/29/2016 1124   TRIG 87 08/29/2016 1124   HDL 52 08/29/2016 1124   CHOLHDL 3.5 08/29/2016 1124   CHOLHDL 3.9 04/08/2016 1025   VLDL 12 04/08/2016 1025   LDLCALC 114 (H) 08/29/2016 1124     Assessment & Plan:   1. Annual physical exam Counseled on 150 minutes of exercise per week, healthy eating (including decreased daily intake of saturated fats, cholesterol, added sugars, sodium), STI prevention, routine healthcare maintenance. - CMP14+EGFR; Future - Lipid panel; Future - Hepatitis c antibody (reflex); Future - Hemoglobin A1c; Future - CBC with Differential/Platelet; Future  2. Screening for breast cancer  3. Screening for cervical cancer - Cytology -  PAP([facility])  4. Actinic keratosis Patient reassured but she would like to have a dermatology referral and this is been placed - Ambulatory referral to Dermatology  5. Mastodynia of left breast  - MM Digital Diagnostic Bilat; Future - US BREAST LTD UNI LEFT INC AXILLA; Future   No orders of the defined types were placed in this encounter.   Follow-up: Return in about 1 year (around 11/18/2018) for Annual physical exam.   Charlott Rakes MD

## 2017-11-18 ENCOUNTER — Other Ambulatory Visit: Payer: Self-pay

## 2017-11-18 ENCOUNTER — Ambulatory Visit: Payer: Medicaid Other | Attending: Family Medicine

## 2017-11-18 ENCOUNTER — Other Ambulatory Visit (HOSPITAL_COMMUNITY): Payer: Self-pay | Admitting: *Deleted

## 2017-11-18 DIAGNOSIS — I10 Essential (primary) hypertension: Secondary | ICD-10-CM

## 2017-11-18 DIAGNOSIS — Z0000 Encounter for general adult medical examination without abnormal findings: Secondary | ICD-10-CM | POA: Insufficient documentation

## 2017-11-18 DIAGNOSIS — Z114 Encounter for screening for human immunodeficiency virus [HIV]: Secondary | ICD-10-CM | POA: Insufficient documentation

## 2017-11-18 DIAGNOSIS — N644 Mastodynia: Secondary | ICD-10-CM

## 2017-11-18 LAB — CYTOLOGY - PAP: DIAGNOSIS: NEGATIVE

## 2017-11-18 MED ORDER — BENAZEPRIL HCL 20 MG PO TABS: 20.0000 mg | ORAL_TABLET | Freq: Every day | ORAL | 6 refills | Status: DC

## 2017-11-18 MED FILL — BENAZEPRIL HCL 20 MG TABLET: 20 | 30 days supply | Qty: 30 | Fill #0

## 2017-11-18 NOTE — Addendum Note (Signed)
Addended by: Octaviano Glow on: 11/18/2017 08:48 AM   Modules accepted: Orders

## 2017-11-18 NOTE — Progress Notes (Signed)
Patient here for lab visit only 

## 2017-11-19 LAB — CBC WITH DIFFERENTIAL/PLATELET
BASOS ABS: 0 10*3/uL (ref 0.0–0.2)
Basos: 1 %
EOS (ABSOLUTE): 0.1 10*3/uL (ref 0.0–0.4)
Eos: 2 %
HEMOGLOBIN: 14.6 g/dL (ref 11.1–15.9)
Hematocrit: 43.5 % (ref 34.0–46.6)
Immature Grans (Abs): 0 10*3/uL (ref 0.0–0.1)
Immature Granulocytes: 0 %
LYMPHS ABS: 2.1 10*3/uL (ref 0.7–3.1)
Lymphs: 33 %
MCH: 31.1 pg (ref 26.6–33.0)
MCHC: 33.6 g/dL (ref 31.5–35.7)
MCV: 93 fL (ref 79–97)
MONOCYTES: 6 %
MONOS ABS: 0.4 10*3/uL (ref 0.1–0.9)
NEUTROS ABS: 3.7 10*3/uL (ref 1.4–7.0)
Neutrophils: 58 %
Platelets: 276 10*3/uL (ref 150–450)
RBC: 4.69 x10E6/uL (ref 3.77–5.28)
RDW: 15 % (ref 12.3–15.4)
WBC: 6.3 10*3/uL (ref 3.4–10.8)

## 2017-11-19 LAB — CMP14+EGFR
A/G RATIO: 1.6 (ref 1.2–2.2)
ALT: 13 IU/L (ref 0–32)
AST: 12 IU/L (ref 0–40)
Albumin: 4.4 g/dL (ref 3.5–5.5)
Alkaline Phosphatase: 78 IU/L (ref 39–117)
BUN / CREAT RATIO: 21 (ref 9–23)
BUN: 17 mg/dL (ref 6–24)
Bilirubin Total: 0.3 mg/dL (ref 0.0–1.2)
CALCIUM: 9.9 mg/dL (ref 8.7–10.2)
CO2: 26 mmol/L (ref 20–29)
CREATININE: 0.81 mg/dL (ref 0.57–1.00)
Chloride: 100 mmol/L (ref 96–106)
GFR, EST AFRICAN AMERICAN: 93 mL/min/{1.73_m2} (ref >59)
GFR, EST NON AFRICAN AMERICAN: 81 mL/min/{1.73_m2} (ref >59)
GLUCOSE: 96 mg/dL (ref 65–99)
Globulin, Total: 2.7 g/dL (ref 1.5–4.5)
POTASSIUM: 4.4 mmol/L (ref 3.5–5.2)
SODIUM: 140 mmol/L (ref 134–144)
Total Protein: 7.1 g/dL (ref 6.0–8.5)

## 2017-11-19 LAB — HEPATITIS C ANTIBODY (REFLEX): HCV Ab: <0.1 s/co ratio (ref 0.0–0.9)

## 2017-11-19 LAB — HIV ANTIBODY (ROUTINE TESTING W REFLEX): HIV Screen 4th Generation wRfx: NONREACTIVE

## 2017-11-19 LAB — HEMOGLOBIN A1C
Est. average glucose Bld gHb Est-mCnc: 103 mg/dL
HEMOGLOBIN A1C: 5.2 % (ref 4.8–5.6)

## 2017-11-19 LAB — LIPID PANEL
CHOL/HDL RATIO: 3.6 ratio (ref 0.0–4.4)
Cholesterol, Total: 224 mg/dL — ABNORMAL HIGH (ref 100–199)
HDL: 62 mg/dL (ref >39)
LDL CALC: 136 mg/dL — AB (ref 0–99)
TRIGLYCERIDES: 132 mg/dL (ref 0–149)
VLDL Cholesterol Cal: 26 mg/dL (ref 5–40)

## 2017-11-19 LAB — HCV COMMENT:

## 2017-12-31 ENCOUNTER — Ambulatory Visit (HOSPITAL_COMMUNITY)
Admission: RE | Admit: 2017-12-31 | Discharge: 2017-12-31 | Disposition: A | Discharge status: Home / Self Care | Payer: Medicaid Other | Source: Ambulatory Visit | Attending: Obstetrics and Gynecology | Admitting: Obstetrics and Gynecology

## 2017-12-31 ENCOUNTER — Ambulatory Visit
Admission: RE | Admit: 2017-12-31 | Discharge: 2017-12-31 | Disposition: A | Discharge status: Home / Self Care | Payer: No Typology Code available for payment source | Source: Ambulatory Visit | Attending: Obstetrics and Gynecology | Admitting: Obstetrics and Gynecology

## 2017-12-31 ENCOUNTER — Ambulatory Visit: Payer: Medicaid Other

## 2017-12-31 ENCOUNTER — Encounter (HOSPITAL_COMMUNITY): Payer: Self-pay

## 2017-12-31 VITALS — BP 108/68 | Ht 63.5 in | Wt 192.4 lb

## 2017-12-31 DIAGNOSIS — Z1239 Encounter for other screening for malignant neoplasm of breast: Secondary | ICD-10-CM

## 2017-12-31 DIAGNOSIS — N644 Mastodynia: Secondary | ICD-10-CM

## 2017-12-31 IMAGING — MG DIGITAL DIAGNOSTIC BILATERAL MAMMOGRAM WITH TOMO AND CAD
8 series · 8 of 24 positions shown · non-contrast
Comparison: Previous exam(s).

CLINICAL DATA: 57-year-old female with history of non focal left
breast pain which she no longer feels.

EXAM:
DIGITAL DIAGNOSTIC BILATERAL MAMMOGRAM WITH CAD AND TOMO

[L CC synth-2D]
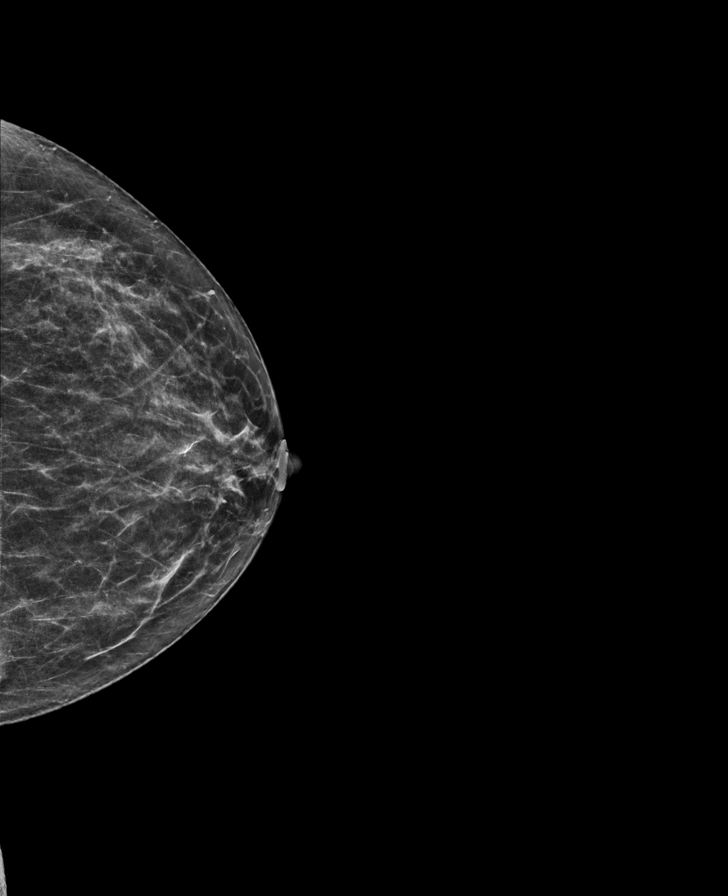

[L MLO synth-2D]
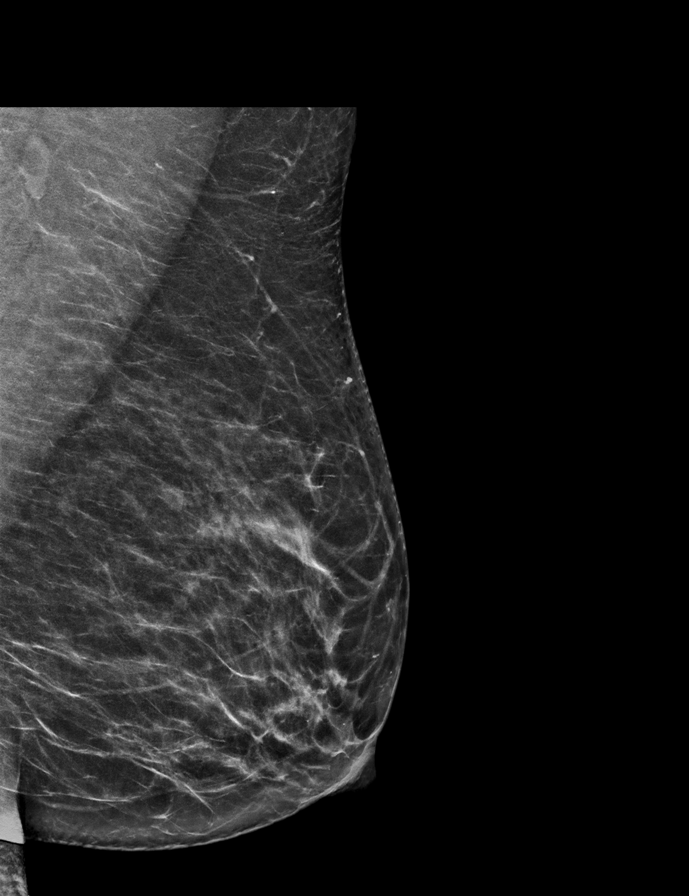

[R CC synth-2D]
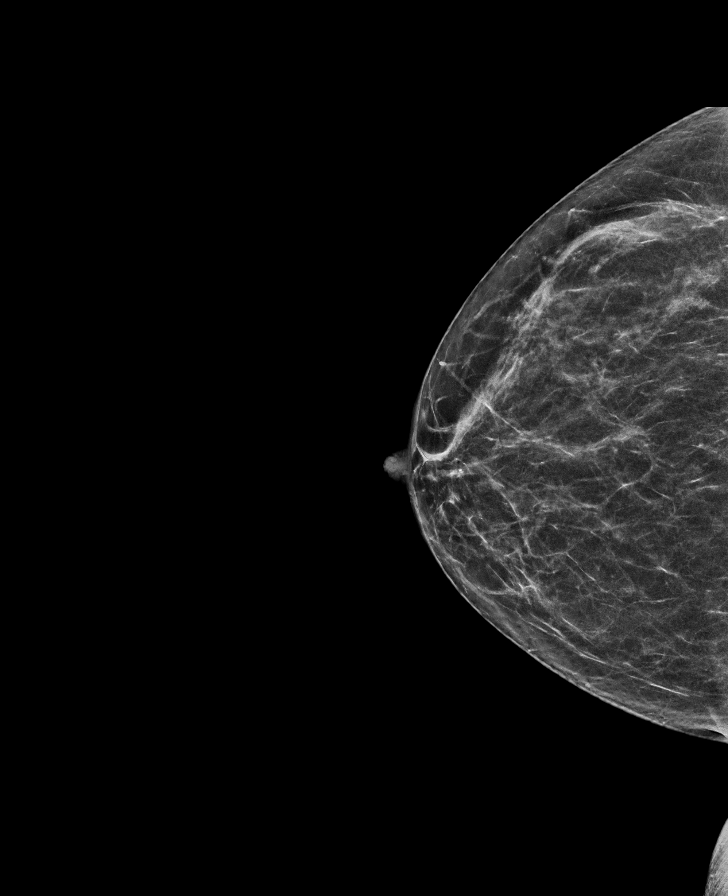

[R MLO synth-2D]
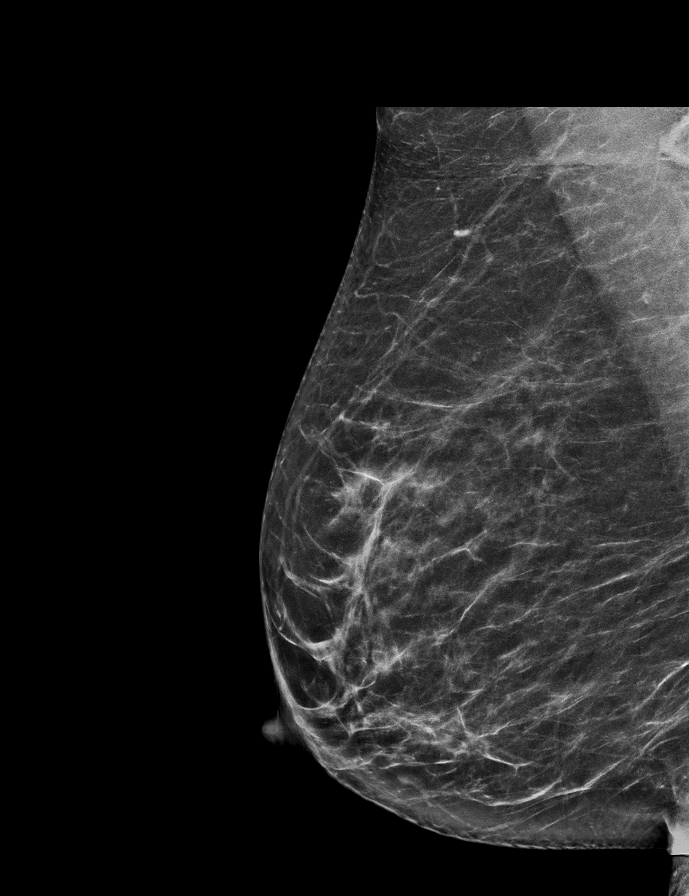

[R CC tomo · tomo slice 29/58.0]
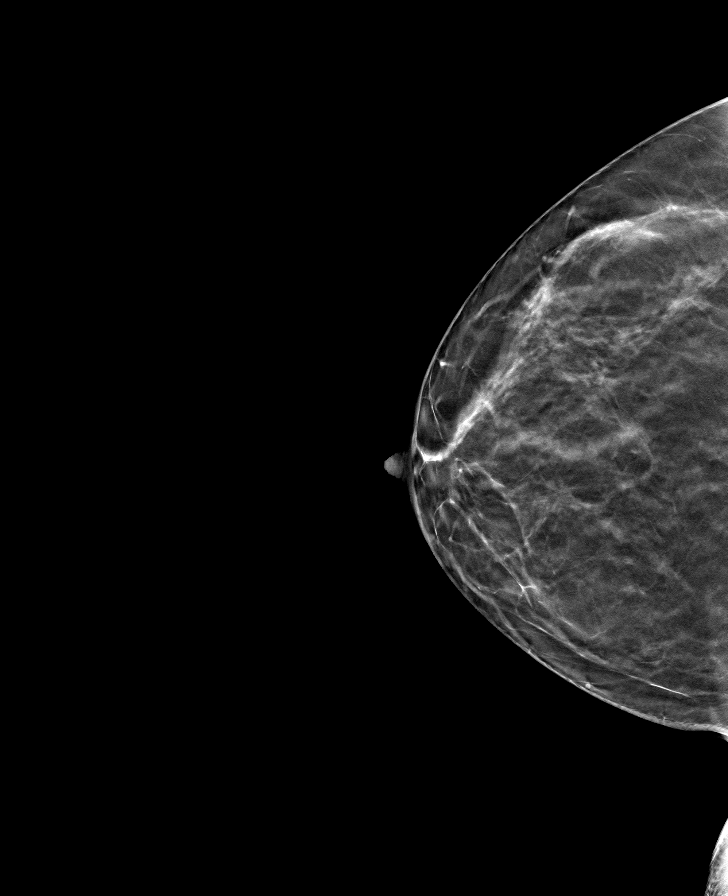

[L CC tomo · tomo slice 29/57.0]
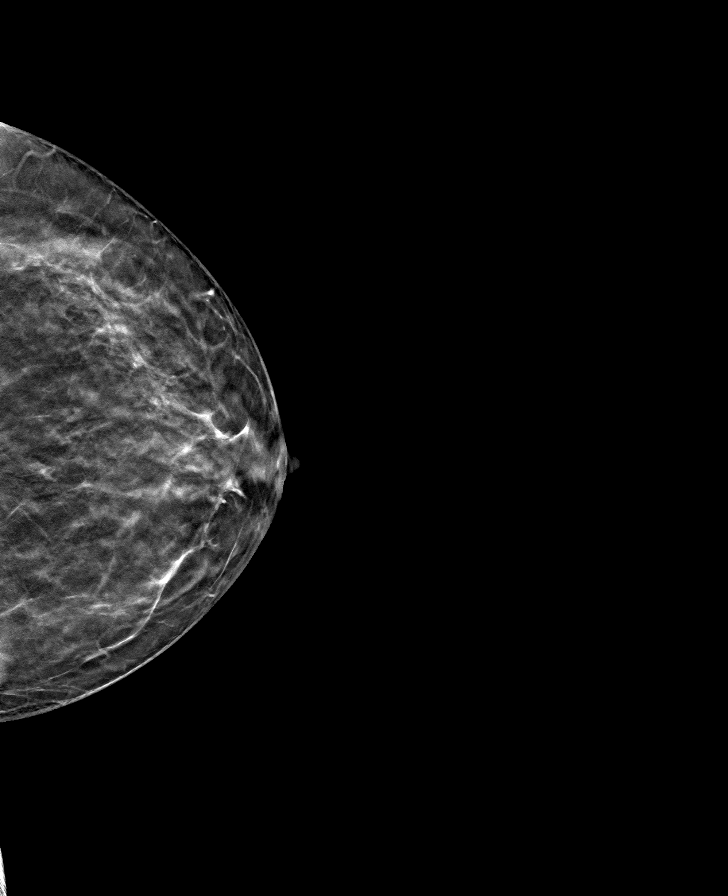

[R MLO tomo · tomo slice 35/68.0]
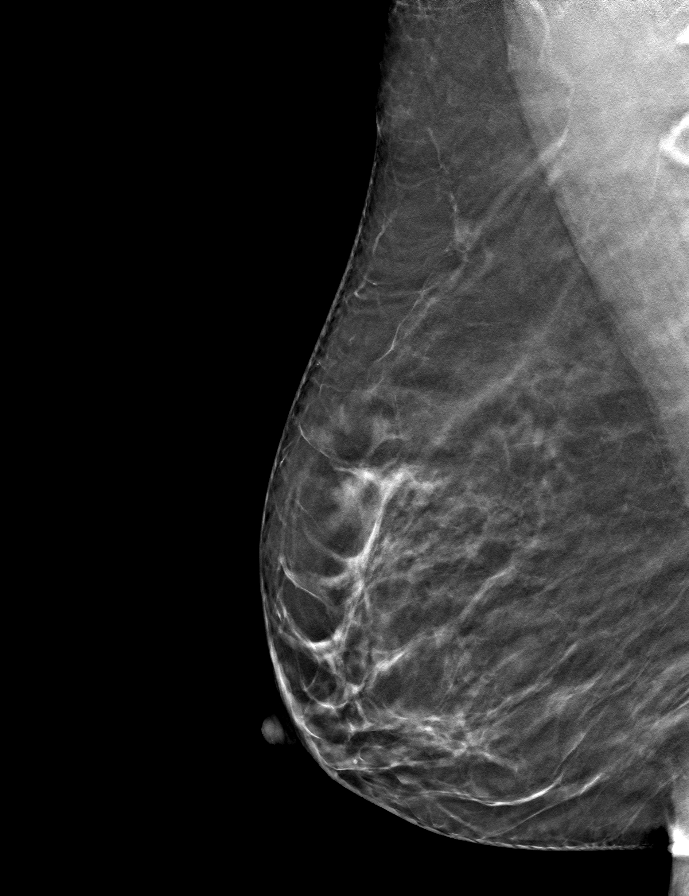

[L MLO tomo · tomo slice 33/66.0]
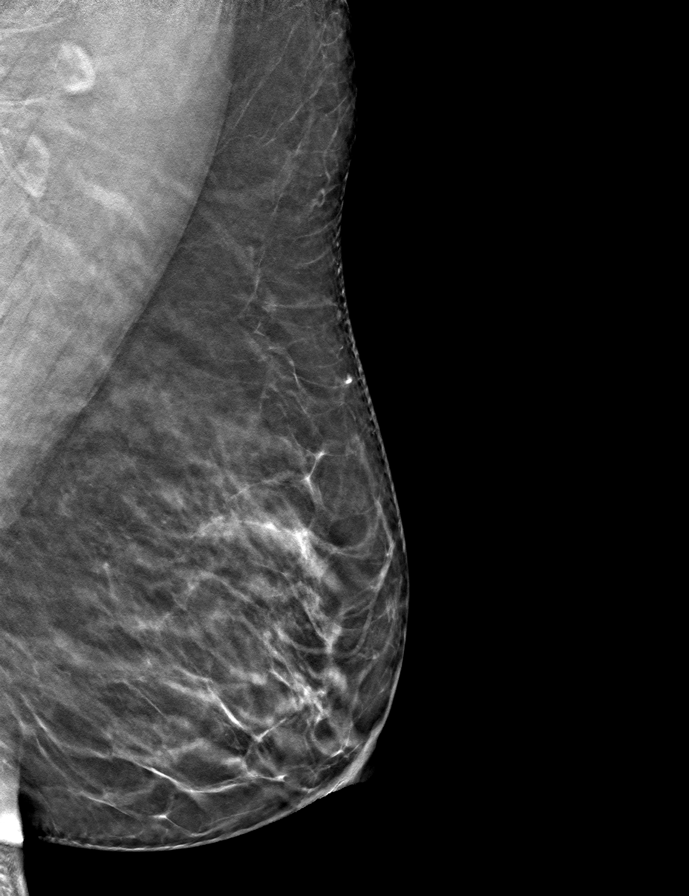

[8 of 24 positions shown; findings below may reference images not displayed]

ACR Breast Density Category b: There are scattered areas of
fibroglandular density.
FINDINGS: No suspicious masses or calcifications are seen in either breast.
There is no mammographic evidence of malignancy in either breast.

Mammographic images were processed with CAD.
IMPRESSION: No mammographic evidence of malignancy in either breast.

RECOMMENDATION:
Screening mammogram in one year.(Code:[68])

I have discussed the findings and recommendations with the patient.
Results were also provided in writing at the conclusion of the
visit. If applicable, a reminder letter will be sent to the patient
regarding the next appointment.

BI-RADS CATEGORY  1: Negative.

## 2017-12-31 NOTE — Patient Instructions (Signed)
Explained breast self awareness with [name]. Patient did not need a Pap smear today due to last Pap smear was 11/17/2017. Let patient know that due to her history of uterine cancer that her next Pap smear is due in one year. Referred patient to the [facility] for a diagnostic mammogram. Appointment scheduled for Thursday, December 31, 2017 at 1050. Charmine Bockrath verbalized understanding.  Bria Portales, Arvil Chaco, RN 9:52 AM

## 2017-12-31 NOTE — Progress Notes (Signed)
Complaints of left breast pain that comes and goes x 3 months. Patient rates the pain at a 6-7 out of 10.  Pap Smear: Pap smear not completed today. Last Pap smear was 11/17/2017 at [facility] and normal. Per patient has no history of an abnormal Pap smear. Patient has a history of a hysterectomy for uterine cancer and AUB 02/04/2005. Last Pap smear and hysterectomy results are in Epic.  Physical exam: Breasts Breasts symmetrical. No skin abnormalities bilateral breasts. No nipple retraction bilateral breasts. No nipple discharge bilateral breasts. No lymphadenopathy. No lumps palpated bilateral breasts. Complaints of left outer breast tenderness on exam. Referred patient to the [facility] for a diagnostic mammogram. Appointment scheduled for Thursday, December 31, 2017 at 1050.        Pelvic/Bimanual No Pap smear completed today since last Pap smear was 11/17/2017. Pap smear not indicated per BCCCP guidelines.   Smoking History: Patient has never smoked.  Patient Navigation: Patient education provided. Access to services provided for patient through [facility] program.   Colorectal Cancer Screening: Per patient had a colonoscopy completed 7 years ago. No complaints today. FIT Test given to patient to complete and return to BCCCP.  Breast and Cervical Cancer Risk Assessment: Patient has no family history of a paternal aunt having  breast cancer. Patient has no known genetic mutations or history of radiation treatment to the chest before age 61. Patient has no history of cervical dysplasia, immunocompromised, or DES exposure in-utero.  Risk Assessment    Risk Scores      12/31/2017   Last edited by: Armond Hang, LPN   5-year risk: 1.8 %   Lifetime risk: 10.7 %

## 2018-01-01 ENCOUNTER — Encounter (HOSPITAL_COMMUNITY): Payer: Self-pay | Admitting: *Deleted

## 2018-01-22 ENCOUNTER — Ambulatory Visit: Payer: Self-pay | Attending: Family Medicine | Admitting: *Deleted

## 2018-01-25 ENCOUNTER — Ambulatory Visit: Payer: Self-pay | Attending: Family Medicine | Admitting: Family Medicine

## 2018-01-25 VITALS — BP 110/73 | HR 88 | Temp 98.3°F | Resp 17 | Ht 62.0 in | Wt 195.0 lb

## 2018-01-25 DIAGNOSIS — L853 Xerosis cutis: Secondary | ICD-10-CM

## 2018-01-25 DIAGNOSIS — L659 Nonscarring hair loss, unspecified: Secondary | ICD-10-CM

## 2018-01-25 NOTE — Progress Notes (Signed)
Lab visit only. 

## 2018-01-26 LAB — THYROID PANEL WITH TSH
Free Thyroxine Index: 1.7 (ref 1.2–4.9)
T3 Uptake Ratio: 21 % — ABNORMAL LOW (ref 24–39)
T4, Total: 8.3 ug/dL (ref 4.5–12.0)
TSH: 1.35 u[IU]/mL (ref 0.450–4.500)

## 2018-01-26 LAB — VITAMIN D 25 HYDROXY (VIT D DEFICIENCY, FRACTURES): Vit D, 25-Hydroxy: 21.2 ng/mL — ABNORMAL LOW (ref 30.0–100.0)

## 2018-01-29 ENCOUNTER — Encounter: Payer: Self-pay | Admitting: Family Medicine

## 2018-01-29 ENCOUNTER — Other Ambulatory Visit: Payer: Self-pay

## 2018-01-29 ENCOUNTER — Ambulatory Visit: Payer: Self-pay | Attending: Family Medicine | Admitting: Family Medicine

## 2018-01-29 VITALS — BP 105/67 | HR 86 | Temp 98.2°F | Resp 18 | Ht 63.5 in | Wt 195.0 lb

## 2018-01-29 DIAGNOSIS — F419 Anxiety disorder, unspecified: Secondary | ICD-10-CM | POA: Insufficient documentation

## 2018-01-29 DIAGNOSIS — Z79899 Other long term (current) drug therapy: Secondary | ICD-10-CM | POA: Insufficient documentation

## 2018-01-29 DIAGNOSIS — E785 Hyperlipidemia, unspecified: Secondary | ICD-10-CM | POA: Insufficient documentation

## 2018-01-29 DIAGNOSIS — E559 Vitamin D deficiency, unspecified: Secondary | ICD-10-CM

## 2018-01-29 DIAGNOSIS — Z780 Asymptomatic menopausal state: Secondary | ICD-10-CM | POA: Insufficient documentation

## 2018-01-29 DIAGNOSIS — L659 Nonscarring hair loss, unspecified: Secondary | ICD-10-CM

## 2018-01-29 DIAGNOSIS — Z8249 Family history of ischemic heart disease and other diseases of the circulatory system: Secondary | ICD-10-CM | POA: Insufficient documentation

## 2018-01-29 DIAGNOSIS — Z8262 Family history of osteoporosis: Secondary | ICD-10-CM | POA: Insufficient documentation

## 2018-01-29 DIAGNOSIS — Z880 Allergy status to penicillin: Secondary | ICD-10-CM | POA: Insufficient documentation

## 2018-01-29 DIAGNOSIS — I10 Essential (primary) hypertension: Secondary | ICD-10-CM | POA: Insufficient documentation

## 2018-01-29 DIAGNOSIS — K219 Gastro-esophageal reflux disease without esophagitis: Secondary | ICD-10-CM | POA: Insufficient documentation

## 2018-01-29 NOTE — Progress Notes (Signed)
Subjective:    Patient ID: Christina Burke, female    DOB: 08/24/1960, 57 y.o.   MRN: [id number]  HPI 57 year old female who was seen in follow-up of recent labs done regarding hair loss.  Patient states that she recently saw her hairdresser and was told that she does have continued hair loss.  Patient states that the hairdresser showed her large amount of hair that she had shed.  Patient does recall having a stressful event within the last 3 to 4 months that she states that she broke up with her boyfriend.  Patient states that when she washes and styles her hair at home, she also sees a lot of strands come out.  Patient does have some mild fatigue.  Patient wonders about things that she can do to help slow down the hair loss.  Patient does not believe that she has had any issues with anemia.  Patient reports that she is postmenopausal but also had a hysterectomy.  Patient does not take any current multivitamins or nutritional supplements.  Patient reports that her mother had osteoporosis and patient is interested in knowing what she should take for her bone health.  Past Medical History:  Diagnosis Date  . Anxiety   . Chest pain 12/08/2013  . GERD (gastroesophageal reflux disease) 08/10/2014  . Hyperlipidemia   . Hypertension   . Murmur 12/08/2013   Past Surgical History:  Procedure Laterality Date  . ABDOMINAL HYSTERECTOMY    . ABDOMINAL SURGERY     Family History  Problem Relation Age of Onset  . Gallbladder disease Mother   . Heart disease Father   . Colon polyps Sister   . Diabetes Sister        x3  . Diabetes Brother   . Breast cancer Paternal Aunt   . Colon cancer Neg Hx   . Kidney disease Neg Hx   . Esophageal cancer Neg Hx    Social History   Tobacco Use  . Smoking status: Never Smoker  . Smokeless tobacco: Never Used  Substance Use Topics  . Alcohol use: Yes    Alcohol/week: 1.0 - 2.0 standard drinks    Types: 1 - 2 Cans of beer per week    Comment: a night  . Drug  use: No   Allergies  Allergen Reactions  . Amoxicillin Rash    Has patient had a PCN reaction causing immediate rash, facial/tongue/throat swelling, SOB or lightheadedness with hypotension: yes Has patient had a PCN reaction causing severe rash involving mucus membranes or skin necrosis: no Has patient had a PCN reaction that required hospitalization: no Has patient had a PCN reaction occurring within the last 10 years: no If all of the above answers are "NO", then may proceed with Cephalosporin use.   Marland Kitchen Penicillins Rash     Review of Systems  Constitutional: Positive for fatigue. Negative for chills, fever and unexpected weight change.  Respiratory: Negative for cough and shortness of breath.   Cardiovascular: Negative for chest pain and leg swelling.  Gastrointestinal: Positive for abdominal pain. Negative for blood in stool, constipation and nausea.  Endocrine: Negative for polydipsia, polyphagia and polyuria.  Genitourinary: Negative for dysuria and frequency.  Musculoskeletal: Negative for arthralgias, back pain, gait problem and joint swelling.  Skin: Negative for color change and rash.  Neurological: Negative for dizziness, light-headedness and headaches.  Hematological: Negative for adenopathy. Does not bruise/bleed easily.       Objective:   Physical Exam BP 105/67 (BP Location: Left  Arm, Patient Position: Sitting, Cuff Size: Normal)   Pulse 86   Temp 98.2 F (36.8 C) (Oral)   Resp 18   Ht 5' 3.5" (1.613 m)   Wt 195 lb (88.5 kg)   SpO2 98%   BMI 34.00 kg/m  Nursing notes and vital signs reviewed General-overweight, older female in no acute distress HEENT- patient's head is atraumatic/normocephalic, patient with appearance of some thinning of the hair, patient's hair appears to be chemically dyed black.  Patient was asked to run her hands through her hair to see if there was any hair loss and patient did have some strands of hair which were loosened by her lightly  running her hands through her hair however the hair did not appear to have her hair bulb but instead appeared to have broken off.  Patient with normal conjunctiva and extraocular movements intact.  TMs are gray bilaterally, patient with mild edema of the nasal turbinates, patient with mild posterior pharynx erythema. Neck-supple, no lymphadenopathy, no thyromegaly Lungs-clear to auscultation bilaterally Cardiovascular-regular rate and rhythm Back-no CVA tenderness present, abdomen-soft and nontender Extremities-no edema       Assessment & Plan:  1. Hair loss Patient is status post recent visit with another provider due to complaints of hair loss.  Patient had blood work done including thyroid panel and vitamin D level which were reviewed with the patient at today's visit.  Patient's thyroid panel was normal however patient did have vitamin D deficiency as her vitamin D level was 21.2.  Patient is now postmenopausal and therefore does not have any monthly blood loss.  Patient had CBC done in July which did not show anemia.  I discussed with patient that anemia can cause hair loss as well as thyroid disorders and patient had normal labs for both of those conditions.  Also discussed with the patient that nutritional deficiencies and stress can also contribute or cause hair loss.  Patient was encouraged to take an over-the-counter B vitamin supplement called biotin as this has been shown to help with hair loss/hair thinning.  Patient also encouraged to talk with her hairstylist as patient appears to have some breakage of her hair which could be due to her use of hair products/treatments.  Also discussed with the patient that hair thinning also occurs as part of the postmenopausal process.   2. Vitamin D deficiency Patient with a vitamin D deficiency on recent labs.  Patient was offered a prescription for vitamin D or patient can take an over-the-counter supplement.  Patient would like to try  over-the-counter supplement and I suggested that she take 2000 IUs daily of vitamin D for about 4 months and then switch to 1000 IUs of vitamin D daily along with 400 mg of calcium to help with bone health.  Patient reports family history significant for mother with osteoporosis.  Patient was given educational handout regarding bone health as part of her AVS.  * Patient was offered but declined influenza immunization at today's visit An After Visit Summary was printed and given to the patient.  Allergies as of 01/29/2018      Reactions   Amoxicillin Rash   Has patient had a PCN reaction causing immediate rash, facial/tongue/throat swelling, SOB or lightheadedness with hypotension: yes Has patient had a PCN reaction causing severe rash involving mucus membranes or skin necrosis: no Has patient had a PCN reaction that required hospitalization: no Has patient had a PCN reaction occurring within the last 10 years: no If all of  the above answers are "NO", then may proceed with Cephalosporin use.   Penicillins Rash      Medication List        Accurate as of 01/29/18 11:59 PM. Always use your most recent med list.          benazepril 20 MG tablet Commonly known as:  LOTENSIN Take 1 tablet (20 mg total) by mouth daily.   clonazePAM 1 MG tablet Commonly known as:  KLONOPIN Take 1 mg by mouth 2 (two) times daily.      Return if symptoms worsen or fail to improve, for [facility] scheduled f/u with PCP.

## 2018-01-29 NOTE — Patient Instructions (Signed)
Vitamin D Deficiency Vitamin D deficiency is when your body does not have enough vitamin D. Vitamin D is important because:  It helps your body use other minerals that your body needs.  It helps keep your bones strong and healthy.  It may help to prevent some diseases.  It helps your heart and other muscles work well.  You can get vitamin D by:  Eating foods with vitamin D in them.  Drinking or eating milk or other foods that have had vitamin D added to them.  Taking a vitamin D supplement.  Being in the sun.  Not getting enough vitamin D can make your bones become soft. It can also cause other health problems. Follow these instructions at home:  Take medicines and supplements only as told by your doctor.  Eat foods that have vitamin D. These include: ? Dairy products, cereals, or juices with added vitamin D. Check the label for vitamin D. ? Fatty fish like salmon or trout. ? Eggs. ? Oysters.  Do not use tanning beds.  Stay at a healthy weight. Lose weight, if needed.  Keep all follow-up visits as told by your doctor. This is important. Contact a doctor if:  Your symptoms do not go away.  You feel sick to your stomach (nauseous).  Youthrow up (vomit).  You poop less often than usual or you have trouble pooping (constipation). This information is not intended to replace advice given to you by your health care provider. Make sure you discuss any questions you have with your health care provider. Document Released: 04/03/2011 Document Revised: 09/20/2015 Document Reviewed: 08/30/2014 Elsevier Interactive Patient Education  2018 [place] protect organs, store calcium, and anchor muscles. Good health habits, such as eating nutritious foods and exercising regularly, are important for maintaining healthy bones. They can also help to prevent a condition that causes bones to lose density and become weak and brittle (osteoporosis). Why is bone mass  important? Bone mass refers to the amount of bone tissue that you have. The higher your bone mass, the stronger your bones. An important step toward having healthy bones throughout life is to have strong and dense bones during childhood. A young adult who has a high bone mass is more likely to have a high bone mass later in life. Bone mass at its greatest it is called peak bone mass. A large decline in bone mass occurs in older adults. In women, it occurs about the time of menopause. During this time, it is important to practice good health habits, because if more bone is lost than what is replaced, the bones will become less healthy and more likely to break (fracture). If you find that you have a low bone mass, you may be able to prevent osteoporosis or further bone loss by changing your diet and lifestyle. How can I find out if my bone mass is low? Bone mass can be measured with an X-ray test that is called a bone mineral density (BMD) test. This test is recommended for all women who are age 75 or older. It may also be recommended for men who are age 7 or older, or for people who are more likely to develop osteoporosis due to:  Having bones that break easily.  Having a long-term disease that weakens bones, such as kidney disease or rheumatoid arthritis.  Having menopause earlier than normal.  Taking medicine that weakens bones, such as steroids, thyroid hormones, or hormone treatment for breast cancer or  prostate cancer.  Smoking.  Drinking three or more alcoholic drinks each day.  What are the nutritional recommendations for healthy bones? To have healthy bones, you need to get enough of the right minerals and vitamins. Most nutrition experts recommend getting these nutrients from the foods that you eat. Nutritional recommendations vary from person to person. Ask your health care provider what is healthy for you. Here are some general guidelines. Calcium Recommendations Calcium is the most  important (essential) mineral for bone health. Most people can get enough calcium from their diet, but supplements may be recommended for people who are at risk for osteoporosis. Good sources of calcium include:  Dairy products, such as low-fat or nonfat milk, cheese, and yogurt.  Dark green leafy vegetables, such as bok choy and broccoli.  Calcium-fortified foods, such as orange juice, cereal, bread, soy beverages, and tofu products.  Nuts, such as almonds.  Follow these recommended amounts for daily calcium intake:  Children, age 15?3: 700 mg.  Children, age 20?8: 1,000 mg.  Children, age 52?13: 1,300 mg.  Teens, age 58?18: 1,300 mg.  Adults, age 34?50: 1,000 mg.  Adults, age 22?70: ? Men: 1,000 mg. ? Women: 1,200 mg.  Adults, age 70 or older: 1,200 mg.  Pregnant and breastfeeding females: ? Teens: 1,300 mg. ? Adults: 1,000 mg.  Vitamin D Recommendations Vitamin D is the most essential vitamin for bone health. It helps the body to absorb calcium. Sunlight stimulates the skin to make vitamin D, so be sure to get enough sunlight. If you live in a cold climate or you do not get outside often, your health care provider may recommend that you take vitamin D supplements. Good sources of vitamin D in your diet include:  Egg yolks.  Saltwater fish.  Milk and cereal fortified with vitamin D.  Follow these recommended amounts for daily vitamin D intake:  Children and teens, age 52?18: 41 international units.  Adults, age 77 or younger: 400-800 international units.  Adults, age 94 or older: 800-1,000 international units.  Other Nutrients Other nutrients for bone health include:  Phosphorus. This mineral is found in meat, poultry, dairy foods, nuts, and legumes. The recommended daily intake for adult men and adult women is 700 mg.  Magnesium. This mineral is found in seeds, nuts, dark green vegetables, and legumes. The recommended daily intake for adult men is 400?420 mg. For  adult women, it is 310?320 mg.  Vitamin K. This vitamin is found in green leafy vegetables. The recommended daily intake is 120 mg for adult men and 90 mg for adult women.  What type of physical activity is best for building and maintaining healthy bones? Weight-bearing and strength-building activities are important for building and maintaining peak bone mass. Weight-bearing activities cause muscles and bones to work against gravity. Strength-building activities increases muscle strength that supports bones. Weight-bearing and muscle-building activities include:  Walking and hiking.  Jogging and running.  Dancing.  Gym exercises.  Lifting weights.  Tennis and racquetball.  Climbing stairs.  Aerobics.  Adults should get at least 30 minutes of moderate physical activity on most days. Children should get at least 60 minutes of moderate physical activity on most days. Ask your health care provide what type of exercise is best for you. Where can I find more information? For more information, check out the following websites:  [place]: [employer].se  [employer] of Health: http://www.niams.AnonymousEar.fr.asp  This information is not intended to replace advice given to you by your health  care provider. Make sure you discuss any questions you have with your health care provider. Document Released: 07/05/2003 Document Revised: 11/02/2015 Document Reviewed: 04/19/2014 Elsevier Interactive Patient Education  [employer].

## 2018-02-12 LAB — FECAL OCCULT BLOOD, IMMUNOCHEMICAL: Fecal Occult Bld: NEGATIVE

## 2018-02-17 MED FILL — BENAZEPRIL HCL 20 MG TABLET: 20 | 30 days supply | Qty: 30 | Fill #1

## 2018-02-22 ENCOUNTER — Encounter (HOSPITAL_COMMUNITY): Payer: Self-pay | Admitting: *Deleted

## 2018-02-22 NOTE — Progress Notes (Signed)
Letter mailed to patient with negative Fit Test results.  

## 2018-03-19 ENCOUNTER — Telehealth: Payer: Self-pay | Admitting: Family Medicine

## 2018-03-19 NOTE — Telephone Encounter (Signed)
Patient was called and informed of to fast for upcoming appointment.

## 2018-03-19 NOTE — Telephone Encounter (Signed)
Patient called to know if she is suppose to come in fasting for her upcoming appointment on 11/26. Please follow up with patient.

## 2018-03-23 ENCOUNTER — Encounter: Payer: Self-pay | Admitting: Family Medicine

## 2018-03-23 ENCOUNTER — Ambulatory Visit: Payer: Self-pay | Attending: Family Medicine | Admitting: Family Medicine

## 2018-03-23 ENCOUNTER — Ambulatory Visit: Payer: Self-pay

## 2018-03-23 VITALS — BP 110/66 | HR 73 | Temp 98.2°F | Ht 63.5 in | Wt 188.2 lb

## 2018-03-23 DIAGNOSIS — E559 Vitamin D deficiency, unspecified: Secondary | ICD-10-CM | POA: Insufficient documentation

## 2018-03-23 DIAGNOSIS — M62838 Other muscle spasm: Secondary | ICD-10-CM | POA: Insufficient documentation

## 2018-03-23 DIAGNOSIS — I10 Essential (primary) hypertension: Secondary | ICD-10-CM | POA: Insufficient documentation

## 2018-03-23 DIAGNOSIS — E785 Hyperlipidemia, unspecified: Secondary | ICD-10-CM | POA: Insufficient documentation

## 2018-03-23 DIAGNOSIS — F419 Anxiety disorder, unspecified: Secondary | ICD-10-CM | POA: Insufficient documentation

## 2018-03-23 DIAGNOSIS — G8929 Other chronic pain: Secondary | ICD-10-CM | POA: Insufficient documentation

## 2018-03-23 DIAGNOSIS — Z79899 Other long term (current) drug therapy: Secondary | ICD-10-CM | POA: Insufficient documentation

## 2018-03-23 DIAGNOSIS — K219 Gastro-esophageal reflux disease without esophagitis: Secondary | ICD-10-CM | POA: Insufficient documentation

## 2018-03-23 DIAGNOSIS — R1011 Right upper quadrant pain: Secondary | ICD-10-CM | POA: Insufficient documentation

## 2018-03-23 DIAGNOSIS — L659 Nonscarring hair loss, unspecified: Secondary | ICD-10-CM | POA: Insufficient documentation

## 2018-03-23 MED ORDER — MELOXICAM 7.5 MG PO TABS: 7.5000 mg | ORAL_TABLET | Freq: Every day | ORAL | 1 refills | Status: DC

## 2018-03-23 MED ORDER — BENAZEPRIL HCL 20 MG PO TABS: 20.0000 mg | ORAL_TABLET | Freq: Every day | ORAL | 6 refills | Status: DC

## 2018-03-23 MED ORDER — METHOCARBAMOL 500 MG PO TABS: 500.0000 mg | ORAL_TABLET | Freq: Three times a day (TID) | ORAL | 1 refills | Status: DC | PRN

## 2018-03-23 MED FILL — MELOXICAM 7.5 MG TABLET: 7.5 | 30 days supply | Qty: 30 | Fill #0

## 2018-03-23 MED FILL — METHOCARBAMOL 500 MG TABS: 500 | 20 days supply | Qty: 60 | Fill #0

## 2018-03-23 MED FILL — BENAZEPRIL HCL 20 MG TABLET: 20 | 30 days supply | Qty: 30 | Fill #0

## 2018-03-23 NOTE — Progress Notes (Signed)
Patient here for lab work only 

## 2018-03-23 NOTE — Patient Instructions (Signed)

## 2018-03-23 NOTE — Progress Notes (Signed)
Patient is having pain in her neck and back.

## 2018-03-23 NOTE — Progress Notes (Signed)
Subjective:  Patient ID: Christina Burke, female    DOB: 07/19/1960  Age: 57 y.o. MRN: [id number]  CC: Back Pain and Neck Pain   HPI Christina Burke is a 57 year old female with history of hypertension, anxiety who presents today for follow-up visit. She complains of neck and upper back pain which she has had for the past 8 years and received shots in her back with resolution of symptoms however 1 month ago symptoms returned and are worse when she goes fishing.  Pain is described as a 5-6/10 and does not radiate down her upper extremity, is not associated with numbness or tingling and no reduced handgrip.  The range of motion in her upper extremities is normal and she currently uses Aleve with some relief only for pain to return. Her anxiety is managed by an outside provider where she receives chronic pain and she uses this intermittently. She is doing well on her antihypertensive. She had seen Dr. follow-up last month for hair loss which she states has improved and at that time was found to have normal thyroid function but vitamin D deficiency and is currently using OTC vitamin D supplements.  Past Medical History:  Diagnosis Date  . Anxiety   . Chest pain 12/08/2013  . GERD (gastroesophageal reflux disease) 08/10/2014  . Hyperlipidemia   . Hypertension   . Murmur 12/08/2013   Past Surgical History:  Procedure Laterality Date  . ABDOMINAL HYSTERECTOMY    . ABDOMINAL SURGERY      Allergies  Allergen Reactions  . Amoxicillin Rash    Has patient had a PCN reaction causing immediate rash, facial/tongue/throat swelling, SOB or lightheadedness with hypotension: yes Has patient had a PCN reaction causing severe rash involving mucus membranes or skin necrosis: no Has patient had a PCN reaction that required hospitalization: no Has patient had a PCN reaction occurring within the last 10 years: no If all of the above answers are "NO", then may proceed with Cephalosporin use.   Marland Kitchen  Penicillins Rash      Outpatient Medications Prior to Visit  Medication Sig Dispense Refill  . clonazePAM (KLONOPIN) 1 MG tablet Take 1 mg by mouth 2 (two) times daily.    . benazepril (LOTENSIN) 20 MG tablet Take 1 tablet (20 mg total) by mouth daily. 30 tablet 6   No facility-administered medications prior to visit.     ROS Review of Systems  Constitutional: Negative for activity change, appetite change and fatigue.  HENT: Negative for congestion, sinus pressure and sore throat.   Eyes: Negative for visual disturbance.  Respiratory: Negative for cough, chest tightness, shortness of breath and wheezing.   Cardiovascular: Negative for chest pain and palpitations.  Gastrointestinal: Negative for abdominal distention, abdominal pain and constipation.  Endocrine: Negative for polydipsia.  Genitourinary: Negative for dysuria and frequency.  Musculoskeletal: Positive for back pain and neck pain. Negative for arthralgias.  Skin: Negative for rash.  Neurological: Negative for tremors, light-headedness and numbness.  Hematological: Does not bruise/bleed easily.  Psychiatric/Behavioral: Negative for agitation and behavioral problems.    Objective:  BP 110/66   Pulse 73   Temp 98.2 F (36.8 C) (Oral)   Ht 5' 3.5" (1.613 m)   Wt 188 lb 3.2 oz (85.4 kg)   SpO2 97%   BMI 32.82 kg/m   BP/Weight 03/23/2018 01/29/2018 9/32/6712  Systolic BP 458 099 833  Diastolic BP 66 67 73  Wt. (Lbs) 188.2 195 195  BMI 32.82 34 35.67  Physical Exam  Constitutional: She is oriented to person, place, and time. She appears well-developed and well-nourished.  Neck: Normal range of motion.  Cardiovascular: Normal rate and intact distal pulses.  Murmur heard. Pulmonary/Chest: Effort normal and breath sounds normal. She has no wheezes. She has no rales. She exhibits no tenderness.  Abdominal: Soft. Bowel sounds are normal. She exhibits no distension and no mass. There is no tenderness.    Musculoskeletal: Normal range of motion.  Neurological: She is alert and oriented to person, place, and time. She displays normal reflexes. No sensory deficit. She exhibits normal muscle tone.  Normal handgrip     Assessment & Plan:   1. Essential hypertension Controlled Counseled on blood pressure goal of less than 130/80, low-sodium, DASH diet, medication compliance, 150 minutes of moderate intensity exercise per week. Discussed medication compliance, adverse effects. - Comprehensive metabolic panel - Lipid panel - benazepril (LOTENSIN) 20 MG tablet; Take 1 tablet (20 mg total) by mouth daily.  Dispense: 30 tablet; Refill: 6  2. Hair loss Improving  3. Vitamin D deficiency Currently on OTC vitamin D  4. Muscle spasm Advised to apply heat Consider PT if symptoms persist - methocarbamol (ROBAXIN) 500 MG tablet; Take 1 tablet (500 mg total) by mouth every 8 (eight) hours as needed for muscle spasms.  Dispense: 60 tablet; Refill: 1 - meloxicam (MOBIC) 7.5 MG tablet; Take 1 tablet (7.5 mg total) by mouth daily.  Dispense: 30 tablet; Refill: 1   Meds ordered this encounter  Medications  . benazepril (LOTENSIN) 20 MG tablet    Sig: Take 1 tablet (20 mg total) by mouth daily.    Dispense:  30 tablet    Refill:  6  . methocarbamol (ROBAXIN) 500 MG tablet    Sig: Take 1 tablet (500 mg total) by mouth every 8 (eight) hours as needed for muscle spasms.    Dispense:  60 tablet    Refill:  1  . meloxicam (MOBIC) 7.5 MG tablet    Sig: Take 1 tablet (7.5 mg total) by mouth daily.    Dispense:  30 tablet    Refill:  1    Follow-up: Return in about 3 months (around 06/23/2018) for Follow-up of chronic medical conditions.   Charlott Rakes MD

## 2018-03-24 LAB — COMPREHENSIVE METABOLIC PANEL
A/G RATIO: 2.2 (ref 1.2–2.2)
ALK PHOS: 65 IU/L (ref 39–117)
ALT: 14 IU/L (ref 0–32)
AST: 13 IU/L (ref 0–40)
Albumin: 4.8 g/dL (ref 3.5–5.5)
BILIRUBIN TOTAL: 0.3 mg/dL (ref 0.0–1.2)
BUN/Creatinine Ratio: 23 (ref 9–23)
BUN: 15 mg/dL (ref 6–24)
CHLORIDE: 100 mmol/L (ref 96–106)
CO2: 23 mmol/L (ref 20–29)
Calcium: 9.8 mg/dL (ref 8.7–10.2)
Creatinine, Ser: 0.65 mg/dL (ref 0.57–1.00)
GFR calc non Af Amer: 99 mL/min/{1.73_m2} (ref >59)
GFR, EST AFRICAN AMERICAN: 114 mL/min/{1.73_m2} (ref >59)
GLUCOSE: 85 mg/dL (ref 65–99)
Globulin, Total: 2.2 g/dL (ref 1.5–4.5)
Potassium: 4.6 mmol/L (ref 3.5–5.2)
Sodium: 138 mmol/L (ref 134–144)
Total Protein: 7 g/dL (ref 6.0–8.5)

## 2018-03-24 LAB — LIPID PANEL
CHOL/HDL RATIO: 3.8 ratio (ref 0.0–4.4)
CHOLESTEROL TOTAL: 213 mg/dL — AB (ref 100–199)
HDL: 56 mg/dL (ref >39)
LDL CALC: 141 mg/dL — AB (ref 0–99)
Triglycerides: 80 mg/dL (ref 0–149)
VLDL CHOLESTEROL CAL: 16 mg/dL (ref 5–40)

## 2018-04-16 ENCOUNTER — Ambulatory Visit: Payer: Self-pay | Attending: Family Medicine

## 2018-04-27 ENCOUNTER — Ambulatory Visit: Payer: Self-pay | Attending: Family Medicine

## 2018-06-05 ENCOUNTER — Other Ambulatory Visit: Payer: Self-pay

## 2018-06-05 ENCOUNTER — Encounter (HOSPITAL_COMMUNITY): Payer: Self-pay

## 2018-06-05 ENCOUNTER — Emergency Department (HOSPITAL_COMMUNITY)
Admission: EM | Admit: 2018-06-05 | Discharge: 2018-06-05 | Disposition: A | Discharge status: Home / Self Care | Payer: Self-pay | Attending: Emergency Medicine | Admitting: Emergency Medicine

## 2018-06-05 ENCOUNTER — Emergency Department (HOSPITAL_COMMUNITY): Payer: Self-pay

## 2018-06-05 DIAGNOSIS — Z79899 Other long term (current) drug therapy: Secondary | ICD-10-CM | POA: Insufficient documentation

## 2018-06-05 DIAGNOSIS — J3489 Other specified disorders of nose and nasal sinuses: Secondary | ICD-10-CM | POA: Insufficient documentation

## 2018-06-05 DIAGNOSIS — I10 Essential (primary) hypertension: Secondary | ICD-10-CM | POA: Insufficient documentation

## 2018-06-05 IMAGING — DX DG NASAL BONES 3+V
3 series · 3 of 3 positions shown · non-contrast
Comparison: None.

CLINICAL DATA: Nose pain.  Object fell on nose.

EXAM:
NASAL BONES - 3+ VIEW

[skull waters]
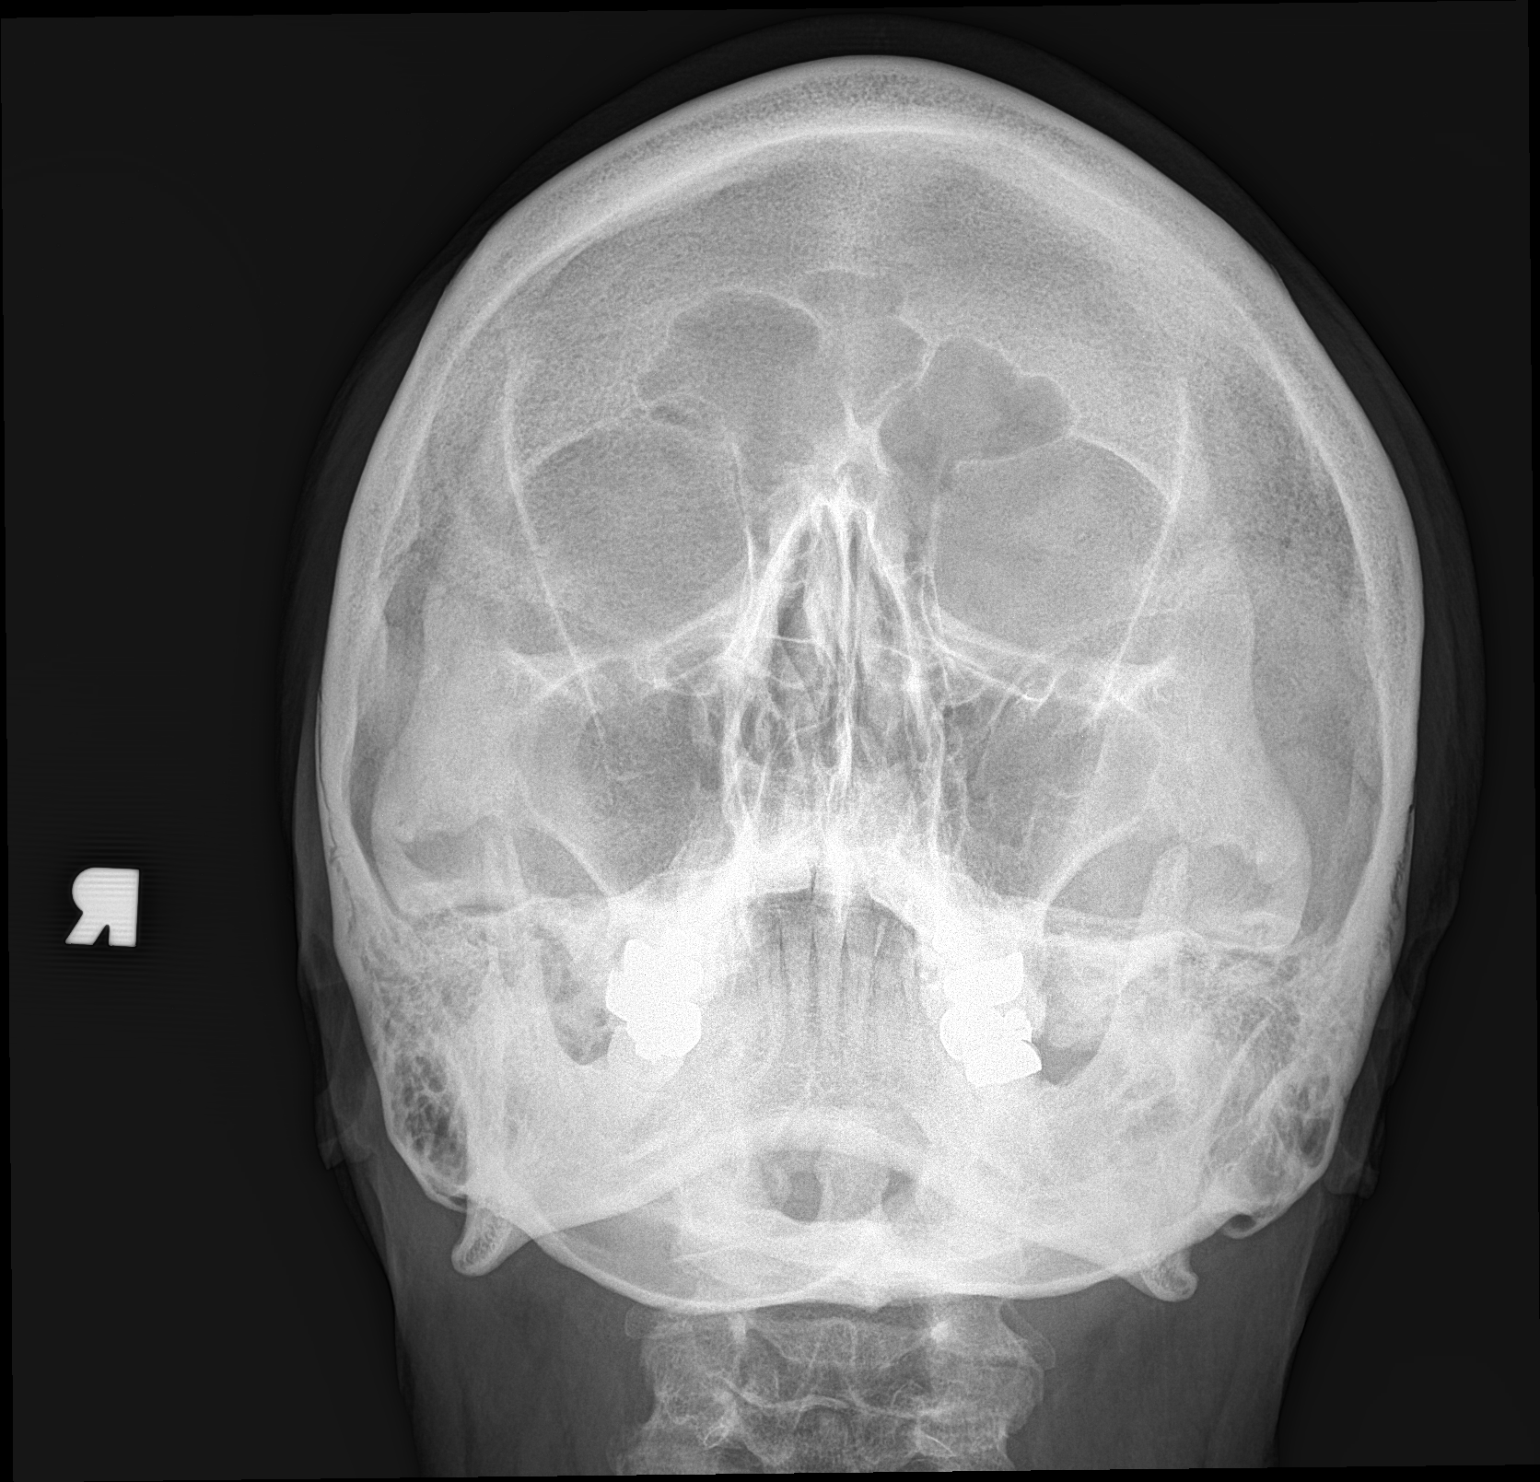

[nasal lat (1 of 2)]
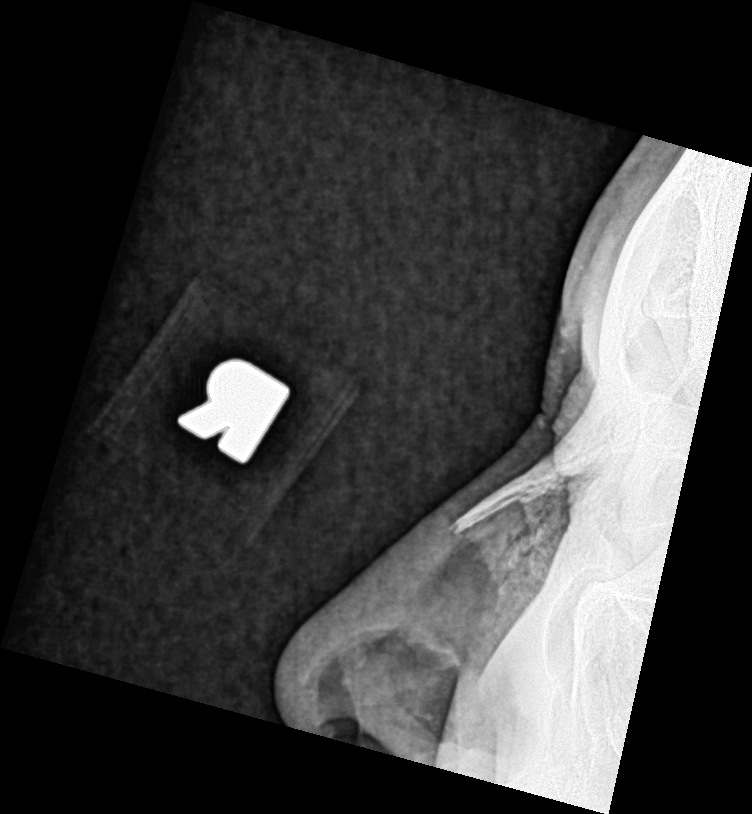

[nasal lat (2 of 2)]
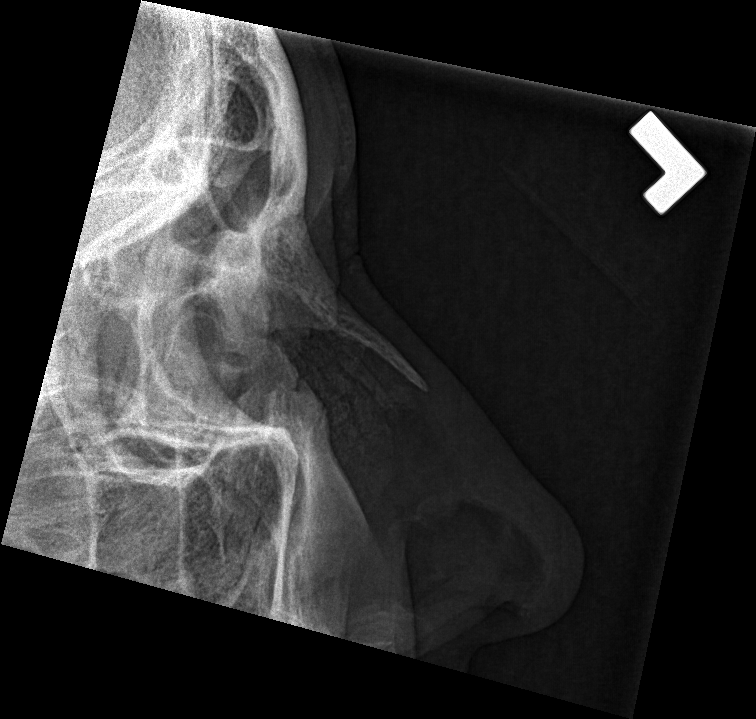

[3 of 3 positions shown; findings below may reference images not displayed]

FINDINGS: There is no evidence of fracture or other bone abnormality.
IMPRESSION: Negative.

## 2018-06-05 MED ORDER — ACETAMINOPHEN 325 MG PO TABS
650.0000 mg | ORAL_TABLET | Freq: Once | ORAL | Status: AC
  Administered 2018-06-05: 650 mg via ORAL
  Filled 2018-06-05: qty 2

## 2018-06-05 MED ORDER — KETOROLAC TROMETHAMINE 30 MG/ML IJ SOLN
15.0000 mg | Freq: Once | INTRAMUSCULAR | Status: AC
  Administered 2018-06-05: 15 mg via INTRAMUSCULAR
  Filled 2018-06-05: qty 1

## 2018-06-05 NOTE — Discharge Instructions (Addendum)
Evaluated today for pain to your nose.  X-ray was negative for acute fracture.  If you have continued pain please follow-up with PCP for reevaluation.  Return to ED for any worsening symptoms.

## 2018-06-05 NOTE — ED Provider Notes (Addendum)
[facility] EMERGENCY DEPARTMENT Provider Note   CSN: [id number] Arrival date & time: 06/05/18  1837   History   Chief Complaint Chief Complaint  Patient presents with  . nose pain    HPI Christina Burke is a 58 y.o. female with past medical history significant for hypertension, hyperlipidemia who presents for evaluation of nose pain.  Patient states that she was getting something off a shelf when a children's toys fell off the shelf and hit her in the nose.  States she has had pain located over her nasal bridge since incident.  Has not taking anything for symptoms PTA.  Rates her pain a 7/10.  Pain does not radiate.  Denies loss of consciousness.  States toy was approximately 3 inches long and made of plastic.  Denies bleeding, fever, chills, nausea, vomiting, vision changes, eye pain, neck pain, neck stiffness headache dizziness, lightheadedness.  Denies additional aggravating or alleviating factors.   History obtained from patient.  No interpreter was used.  HPI  Past Medical History:  Diagnosis Date  . Anxiety   . Chest pain 12/08/2013  . GERD (gastroesophageal reflux disease) 08/10/2014  . Hyperlipidemia   . Hypertension   . Murmur 12/08/2013    Patient Active Problem List   Diagnosis Date Noted  . Vitamin D deficiency 03/23/2018  . Hypertension   . Anxiety   . Hyperlipidemia   . GERD (gastroesophageal reflux disease) 08/10/2014  . HTN (hypertension) 01/25/2014  . Chest pain 12/08/2013  . Murmur 12/08/2013    Past Surgical History:  Procedure Laterality Date  . ABDOMINAL HYSTERECTOMY    . ABDOMINAL SURGERY       OB History    Gravida  3   Para      Term      Preterm      AB  1   Living  2     SAB  1   TAB      Ectopic      Multiple      Live Births  2            Home Medications    Prior to Admission medications   Medication Sig Start Date End Date Taking? Authorizing Provider  benazepril (LOTENSIN) 20 MG tablet Take 1 tablet (20 mg  total) by mouth daily. 03/23/18   Charlott Rakes, MD  clonazePAM (KLONOPIN) 1 MG tablet Take 1 mg by mouth 2 (two) times daily.    [provider]  meloxicam (MOBIC) 7.5 MG tablet Take 1 tablet (7.5 mg total) by mouth daily. 03/23/18   Charlott Rakes, MD  methocarbamol (ROBAXIN) 500 MG tablet Take 1 tablet (500 mg total) by mouth every 8 (eight) hours as needed for muscle spasms. 03/23/18   Charlott Rakes, MD    Family History Family History  Problem Relation Age of Onset  . Gallbladder disease Mother   . Heart disease Father   . Colon polyps Sister   . Diabetes Sister        x3  . Diabetes Brother   . Breast cancer Paternal Aunt   . Colon cancer Neg Hx   . Kidney disease Neg Hx   . Esophageal cancer Neg Hx     Social History Social History   Tobacco Use  . Smoking status: Never Smoker  . Smokeless tobacco: Never Used  Substance Use Topics  . Alcohol use: Yes    Alcohol/week: 1.0 - 2.0 standard drinks    Types: 1 - 2 Cans  of beer per week    Comment: a night  . Drug use: No     Allergies   Amoxicillin and Penicillins   Review of Systems Review of Systems  Constitutional: Negative.   HENT: Negative for congestion, dental problem, ear discharge, ear pain, facial swelling, mouth sores, nosebleeds, postnasal drip, rhinorrhea, sinus pressure and sinus pain.        Pain to nasal bridge  Eyes: Negative.   Respiratory: Negative.   Cardiovascular: Negative.   Gastrointestinal: Negative.   Genitourinary: Negative.   Musculoskeletal: Negative.   Skin: Negative.   Neurological: Negative.   All other systems reviewed and are negative.    Physical Exam Updated Vital Signs BP (!) 134/98 (BP Location: Right Arm)   Pulse 88   Temp 97.7 F (36.5 C) (Oral)   Resp 18   SpO2 100%   Physical Exam Vitals signs and nursing note reviewed.  Constitutional:      General: She is not in acute distress.    Appearance: She is well-developed. She is not  ill-appearing, toxic-appearing or diaphoretic.  HENT:     Head: Normocephalic and atraumatic.     Nose: Signs of injury and nasal tenderness present. No nasal deformity, septal deviation, laceration, mucosal edema, congestion or rhinorrhea.     Right Nostril: No foreign body, epistaxis, septal hematoma or occlusion.     Left Nostril: No foreign body, epistaxis, septal hematoma or occlusion.     Right Turbinates: Not enlarged, swollen or pale.     Left Turbinates: Not enlarged, swollen or pale.     Right Sinus: No maxillary sinus tenderness or frontal sinus tenderness.     Left Sinus: No maxillary sinus tenderness or frontal sinus tenderness.      Comments: Tenderness over the medial nasal bridge. No crepitus or step offs.     Mouth/Throat:     Mouth: Mucous membranes are moist.     Pharynx: Oropharynx is clear.  Eyes:     Pupils: Pupils are equal, round, and reactive to light.     Comments: No horizontal, vertical or rotational nystagmus   Neck:     Musculoskeletal: Normal range of motion.     Comments: Full active and passive ROM without pain No midline or paraspinal tenderness No nuchal rigidity or meningeal signs  Cardiovascular:     Rate and Rhythm: Normal rate.     Pulses: Normal pulses.     Heart sounds: Normal heart sounds.  Pulmonary:     Effort: Pulmonary effort is normal. No respiratory distress.     Breath sounds: Normal breath sounds. No wheezing, rhonchi or rales.  Abdominal:     General: There is no distension.     Tenderness: There is no abdominal tenderness. There is no guarding or rebound.  Musculoskeletal: Normal range of motion.        General: No tenderness.  Skin:    General: Skin is warm and dry.     Comments: 44mm area of erythema to medical nasal bridge. No swelling.  Neurological:     Mental Status: She is alert.     Comments: Mental Status:  Alert, oriented, thought content appropriate. Speech fluent without evidence of aphasia. Able to follow 2 step  commands without difficulty.  Cranial Nerves:  II:  Peripheral visual fields grossly normal, pupils equal, round, reactive to light III,IV, VI: ptosis not present, extra-ocular motions intact bilaterally  V,VII: smile symmetric, facial light touch sensation equal VIII: hearing grossly normal bilaterally  IX,X: midline uvula rise  XI: bilateral shoulder shrug equal and strong XII: midline tongue extension  Motor:  5/5 in upper and lower extremities bilaterally including strong and equal grip strength and dorsiflexion/plantar flexion Sensory: Pinprick and light touch normal in all extremities.  Deep Tendon Reflexes: 2+ and symmetric  Cerebellar: normal finger-to-nose with bilateral upper extremities Gait: normal gait and balance CV: distal pulses palpable throughout      ED Treatments / Results  Labs (all labs ordered are listed, but only abnormal results are displayed) Labs Reviewed - No data to display  EKG None  Radiology Dg Nasal Bones  Result Date: 06/05/2018 CLINICAL DATA:  Nose pain.  Object fell on nose. EXAM: NASAL BONES - 3+ VIEW COMPARISON:  None. FINDINGS: There is no evidence of fracture or other bone abnormality. IMPRESSION: Negative. Electronically Signed   By: Rolm Baptise M.D.   On: 06/05/2018 20:10    Procedures Procedures (including critical care time)  Medications Ordered in ED Medications  acetaminophen (TYLENOL) tablet 650 mg (650 mg Oral Given 06/05/18 2011)  ketorolac (TORADOL) 30 MG/ML injection 15 mg (15 mg Intramuscular Given 06/05/18 2102)     Initial Impression / Assessment and Plan / ED Course  I have reviewed the triage vital signs and the nursing notes.  Pertinent labs & imaging results that were available during my care of the patient were reviewed by me and considered in my medical decision making (see chart for details).  58 year old female who appears otherwise well presents for evaluation of nose pain.  Afebrile, nonseptic,  non-ill-appearing.  Patient had a children's toys on her nose earlier this evening.  Patient has small 3 mm area of erythema over nasal bridge.  Tender to palpation, however no crepitus or step-offs.  No LOC. No evidence of intranasal trauma.  Will get plain film nose and reevaluate. Plain film negative for acute fracture.  On reevaluation patient states she has headache. Non focal neurologic exam without neurologic deficit.  Will give Toradol and reevaluate. HA resolved with Toradol.  Hemodynamically stable and appropriate for DC home at this time.  Low suspicion for acute emergent pathology at this time.  Discussed return precautions.  Patient has had mildly elevated blood pressure in department.  Discussed follow-up with PCP for reevaluation.  She does have history of hypertension.  Denies chest pain, shortness of breath, lightheaded or dizziness, nausea or vomiting.  Patient voiced understanding and is agreeable for follow-up.   Final Clinical Impressions(s) / ED Diagnoses   Final diagnoses:  Pain, nose    ED Discharge Orders    None       Sharad Vaneaton A, PA-C 06/06/18 0025    Desirae Mancusi A, PA-C 06/06/18 0026    Julianne Rice, MD 06/06/18 1549

## 2018-06-05 NOTE — ED Triage Notes (Signed)
Pt reports that she was trying to get something off shelf and toy fell off and hit nose

## 2018-06-14 MED FILL — BENAZEPRIL HCL 20 MG TABLET: 20 | 30 days supply | Qty: 30 | Fill #1

## 2018-06-22 ENCOUNTER — Ambulatory Visit: Payer: Self-pay | Attending: Family Medicine | Admitting: Family Medicine

## 2018-06-22 ENCOUNTER — Encounter: Payer: Self-pay | Admitting: Family Medicine

## 2018-06-22 VITALS — BP 133/79 | HR 91 | Temp 98.3°F | Ht 63.5 in | Wt 182.6 lb

## 2018-06-22 DIAGNOSIS — E559 Vitamin D deficiency, unspecified: Secondary | ICD-10-CM

## 2018-06-22 DIAGNOSIS — I10 Essential (primary) hypertension: Secondary | ICD-10-CM

## 2018-06-22 DIAGNOSIS — G8929 Other chronic pain: Secondary | ICD-10-CM

## 2018-06-22 DIAGNOSIS — M62838 Other muscle spasm: Secondary | ICD-10-CM

## 2018-06-22 DIAGNOSIS — M5441 Lumbago with sciatica, right side: Secondary | ICD-10-CM

## 2018-06-22 MED ORDER — METHOCARBAMOL 500 MG PO TABS: 500.0000 mg | ORAL_TABLET | Freq: Three times a day (TID) | ORAL | 2 refills | Status: DC | PRN

## 2018-06-22 MED ORDER — BENAZEPRIL HCL 20 MG PO TABS: 20.0000 mg | ORAL_TABLET | Freq: Every day | ORAL | 6 refills | Status: DC

## 2018-06-22 MED ORDER — DULOXETINE HCL 60 MG PO CPEP: 60.0000 mg | ORAL_CAPSULE | Freq: Every day | ORAL | 3 refills | Status: DC

## 2018-06-22 MED ORDER — MELOXICAM 7.5 MG PO TABS: 7.5000 mg | ORAL_TABLET | Freq: Every day | ORAL | 2 refills | Status: DC

## 2018-06-22 NOTE — Progress Notes (Signed)
Subjective:  Patient ID: Christina Burke, female    DOB: Jun 21, 1960  Age: 58 y.o. MRN: [id number]  CC: Hypertension   HPI [name] s a 58 year old female with history of hypertension, anxiety who presents today for follow-up visit. She complains of neck, right shoulder and upper back pain which she has had for the past 8 years and received shots in her back from [facility].  She also saw a chiropractor in the past. Back pain is rated at a 7/10 and worse with sitting and sometimes radiates down her right lower extremity.  Neck pain radiates towards her right shoulder and symptoms are uncontrolled on meloxicam and Robaxin which she was prescribed.  Anxiety is controlled on as needed use of Klonopin which she receives from an outside psychiatrist. Doing well on her antihypertensive with no complaints of adverse effects from her medications.    Past Medical History:  Diagnosis Date  . Anxiety   . Chest pain 12/08/2013  . GERD (gastroesophageal reflux disease) 08/10/2014  . Hyperlipidemia   . Hypertension   . Murmur 12/08/2013    Past Surgical History:  Procedure Laterality Date  . ABDOMINAL HYSTERECTOMY    . ABDOMINAL SURGERY      Family History  Problem Relation Age of Onset  . Gallbladder disease Mother   . Heart disease Father   . Colon polyps Sister   . Diabetes Sister        x3  . Diabetes Brother   . Breast cancer Paternal Aunt   . Colon cancer Neg Hx   . Kidney disease Neg Hx   . Esophageal cancer Neg Hx     Allergies  Allergen Reactions  . Amoxicillin Rash    Has patient had a PCN reaction causing immediate rash, facial/tongue/throat swelling, SOB or lightheadedness with hypotension: yes Has patient had a PCN reaction causing severe rash involving mucus membranes or skin necrosis: no Has patient had a PCN reaction that required hospitalization: no Has patient had a PCN reaction occurring within the last 10 years: no If all of the above answers  are "NO", then may proceed with Cephalosporin use.   Marland Kitchen Penicillins Rash    Outpatient Medications Prior to Visit  Medication Sig Dispense Refill  . clonazePAM (KLONOPIN) 1 MG tablet Take 1 mg by mouth 2 (two) times daily.    . benazepril (LOTENSIN) 20 MG tablet Take 1 tablet (20 mg total) by mouth daily. 30 tablet 6  . meloxicam (MOBIC) 7.5 MG tablet Take 1 tablet (7.5 mg total) by mouth daily. 30 tablet 1  . methocarbamol (ROBAXIN) 500 MG tablet Take 1 tablet (500 mg total) by mouth every 8 (eight) hours as needed for muscle spasms. 60 tablet 1   No facility-administered medications prior to visit.      ROS Review of Systems General: negative for fever, weight loss, appetite change Eyes: no visual symptoms. ENT: no ear symptoms, no sinus tenderness, no nasal congestion or sore throat. Neck: no pain  Respiratory: no wheezing, shortness of breath, cough Cardiovascular: no chest pain, no dyspnea on exertion, no pedal edema, no orthopnea. Gastrointestinal: no abdominal pain, no diarrhea, no constipation Genito-Urinary: no urinary frequency, no dysuria, no polyuria. Hematologic: no bruising Endocrine: no cold or heat intolerance Neurological: no headaches, no seizures, no tremors Musculoskeletal: see hpi Skin: no pruritus, no rash. Psychological: no depression, no anxiety,    Objective:  BP 133/79   Pulse 91   Temp 98.3 F (36.8 C) (Oral)  Ht 5' 3.5" (1.613 m)   Wt 182 lb 9.6 oz (82.8 kg)   SpO2 99%   BMI 31.84 kg/m   BP/Weight 06/22/2018 06/05/2018 10/22/9483  Systolic BP 462 703 500  Diastolic BP 79 98 66  Wt. (Lbs) 182.6 - 188.2  BMI 31.84 - 32.82      Physical Exam Constitutional: normal appearing,  Eyes: PERRLA HEENT: Head is atraumatic, normal sinuses, normal oropharynx, normal appearing tonsils and palate, tympanic membrane is normal bilaterally. Neck: normal range of motion, no thyromegaly, no JVD Cardiovascular: normal rate and rhythm, normal heart  sounds, no murmurs, rub or gallop, no pedal edema Respiratory: Normal breath sounds, clear to auscultation bilaterally, no wheezes, no rales, no rhonchi Abdomen: soft, not tender to palpation, normal bowel sounds, no enlarged organs Musculoskeletal: Full ROM of neck and right shoulder with no tenderness to palpation.  Slight tenderness to palpation of lumbar spine and positive straight leg raise on the right Skin: warm and dry, no lesions. Neurological: alert, oriented x3, cranial nerves I-XII grossly intact , normal motor strength, normal sensation. Psychological: normal mood.   CMP Latest Ref Rng & Units 03/23/2018 11/18/2017 09/23/2017  Glucose 65 - 99 mg/dL 85 96 101(H)  BUN 6 - 24 mg/dL 15 17 13   Creatinine 0.57 - 1.00 mg/dL 0.65 0.81 0.83  Sodium 134 - 144 mmol/L 138 140 139  Potassium 3.5 - 5.2 mmol/L 4.6 4.4 4.2  Chloride 96 - 106 mmol/L 100 100 103  CO2 20 - 29 mmol/L 23 26 28   Calcium 8.7 - 10.2 mg/dL 9.8 9.9 9.7  Total Protein 6.0 - 8.5 g/dL 7.0 7.1 7.1  Total Bilirubin 0.0 - 1.2 mg/dL 0.3 0.3 0.8  Alkaline Phos 39 - 117 IU/L 65 78 68  AST 0 - 40 IU/L 13 12 16   ALT 0 - 32 IU/L 14 13 16     Lipid Panel     Component Value Date/Time   CHOL 213 (H) 03/23/2018 1629   TRIG 80 03/23/2018 1629   HDL 56 03/23/2018 1629   CHOLHDL 3.8 03/23/2018 1629   CHOLHDL 3.9 04/08/2016 1025   VLDL 12 04/08/2016 1025   LDLCALC 141 (H) 03/23/2018 1629    CBC    Component Value Date/Time   WBC 6.3 11/18/2017 0849   WBC 4.7 10/08/2015 0926   RBC 4.69 11/18/2017 0849   RBC 4.59 10/08/2015 0926   HGB 14.6 11/18/2017 0849   HCT 43.5 11/18/2017 0849   PLT 276 11/18/2017 0849   MCV 93 11/18/2017 0849   MCH 31.1 11/18/2017 0849   MCH 29.4 10/08/2015 0926   MCHC 33.6 11/18/2017 0849   MCHC 32.5 10/08/2015 0926   RDW 15.0 11/18/2017 0849   LYMPHSABS 2.1 11/18/2017 0849   MONOABS 0.3 03/01/2015 1600   EOSABS 0.1 11/18/2017 0849   BASOSABS 0.0 11/18/2017 0849    Lab Results    Component Value Date   HGBA1C 5.2 11/18/2017    The 10-year ASCVD risk score Mikey Bussing DC Jr., et al., 2013) is: 3.7%   Values used to calculate the score:     Age: 86 years     Sex: Female     Is Non-Hispanic African American: No     Diabetic: No     Tobacco smoker: No     Systolic Blood Pressure: 938 mmHg     Is BP treated: Yes     HDL Cholesterol: 56 mg/dL     Total Cholesterol: 213 mg/dL  Assessment & Plan:  1. Essential hypertension Controlled Counseled on blood pressure goal of less than 130/80, low-sodium, DASH diet, medication compliance, 150 minutes of moderate intensity exercise per week. Discussed medication compliance, adverse effects. - benazepril (LOTENSIN) 20 MG tablet; Take 1 tablet (20 mg total) by mouth daily.  Dispense: 30 tablet; Refill: 6  2. Vitamin D deficiency Has been on OTC vitamin D supplements - VITAMIN D 25 Hydroxy (Vit-D Deficiency, Fractures)  3. Muscle spasm Uncontrolled Will refer for PT - meloxicam (MOBIC) 7.5 MG tablet; Take 1 tablet (7.5 mg total) by mouth daily.  Dispense: 30 tablet; Refill: 2 - methocarbamol (ROBAXIN) 500 MG tablet; Take 1 tablet (500 mg total) by mouth every 8 (eight) hours as needed for muscle spasms.  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 We have discussed structured exercise program, water aerobics, weight loss - Ambulatory referral to Physical Therapy - DULoxetine (CYMBALTA) 60 MG capsule; Take 1 capsule (60 mg total) by mouth daily. For back pain  Dispense: 30 capsule; Refill: 3   Meds ordered this encounter  Medications  . benazepril (LOTENSIN) 20 MG tablet    Sig: Take 1 tablet (20 mg total) by mouth daily.    Dispense:  30 tablet    Refill:  6  . meloxicam (MOBIC) 7.5 MG tablet    Sig: Take 1 tablet (7.5 mg total) by  mouth daily.    Dispense:  30 tablet    Refill:  2  . methocarbamol (ROBAXIN) 500 MG tablet    Sig: Take 1 tablet (500 mg total) by mouth every 8 (eight) hours as needed for muscle spasms.    Dispense:  60 tablet    Refill:  2  . DULoxetine (CYMBALTA) 60 MG capsule    Sig: Take 1 capsule (60 mg total) by mouth daily. For back pain    Dispense:  30 capsule    Refill:  3    Follow-up: Return in about 6 months (around 12/21/2018) for Follow-up of chronic medical conditions.       Charlott Rakes, MD, FAAFP. [facility] and [place], [place]   06/22/2018, 11:50 AM

## 2018-06-22 NOTE — Patient Instructions (Signed)

## 2018-06-23 LAB — VITAMIN D 25 HYDROXY (VIT D DEFICIENCY, FRACTURES): Vit D, 25-Hydroxy: 36 ng/mL (ref 30.0–100.0)

## 2018-06-30 MED FILL — METHOCARBAMOL 500 MG TABS: 500 | 20 days supply | Qty: 60 | Fill #0

## 2018-06-30 MED FILL — ?DULoxetine HCL 6OMG CP: 60 | 30 days supply | Qty: 30 | Fill #0

## 2018-06-30 MED FILL — MELOXICAM 7.5 MG TABLET: 7.5 | 30 days supply | Qty: 30 | Fill #0

## 2018-07-01 ENCOUNTER — Encounter: Payer: Self-pay | Admitting: Physical Therapy

## 2018-07-01 ENCOUNTER — Other Ambulatory Visit: Payer: Self-pay

## 2018-07-01 ENCOUNTER — Ambulatory Visit: Payer: Self-pay | Attending: Family Medicine | Admitting: Physical Therapy

## 2018-07-01 DIAGNOSIS — M25511 Pain in right shoulder: Secondary | ICD-10-CM | POA: Insufficient documentation

## 2018-07-01 DIAGNOSIS — M5441 Lumbago with sciatica, right side: Secondary | ICD-10-CM | POA: Insufficient documentation

## 2018-07-01 DIAGNOSIS — M542 Cervicalgia: Secondary | ICD-10-CM | POA: Insufficient documentation

## 2018-07-01 DIAGNOSIS — G8929 Other chronic pain: Secondary | ICD-10-CM | POA: Insufficient documentation

## 2018-07-01 NOTE — Patient Instructions (Addendum)

## 2018-07-01 NOTE — Therapy (Signed)
[place], [place], [address] Phone: [phone]   Fax:  [phone]  Physical Therapy Evaluation  Patient Details  Name: Christina Burke MRN: [id number] Date of Birth: [birth year] Referring Provider (PT): Charlott Rakes, MD   Encounter Date: 07/01/2018  PT End of Session - 07/01/18 1515    Visit Number  1    Number of Visits  12    Date for PT Re-Evaluation  08/12/18    Authorization Type  CAFA exp 10/26/18    PT Start Time  1143    PT Stop Time  1242    PT Time Calculation (min)  59 min    Activity Tolerance  Patient limited by pain   soreness with dry needling   Behavior During Therapy  [facility] for tasks assessed/performed       Past Medical History:  Diagnosis Date  . Anxiety   . Cancer ([place])    pt. reports had uterine cancer x 10 years ago  . Chest pain 12/08/2013  . GERD (gastroesophageal reflux disease) 08/10/2014  . Hyperlipidemia   . Hypertension   . Murmur 12/08/2013    Past Surgical History:  Procedure Laterality Date  . ABDOMINAL HYSTERECTOMY    . ABDOMINAL SURGERY      There were no vitals filed for this visit.   Subjective Assessment - 07/01/18 1149    Subjective  Pt. is a 58 y/o female referred to PT with c/o LBP with intermittent radiating into right proximal thigh as well as right neck and shoulder pain localized to right upper trapezius region. Pt. reports 8-10 year history of symptoms with insidious onset and attributes possible association with multiple years of performing heavy physical work for [employer].     Pertinent History  chronic pain history, anxiety, reports tried PT a number of years ago without benefit, has also tried chiropractic    Limitations  Sitting;House hold activities;Lifting;Standing;Walking    Patient Stated Goals  Help with pain    Currently in Pain?  Yes    Pain Score  7     Pain Location  Neck   right upper trapezius region   Pain Orientation  Right     Pain Descriptors / Indicators  Tightness    Pain Type  Chronic pain    Pain Radiating Towards  right upper trapezius region    Pain Onset  More than a month ago    Pain Frequency  Constant    Aggravating Factors   sitting    Pain Relieving Factors  massage    Effect of Pain on Daily Activities  limits positional tolerance    Multiple Pain Sites  Yes    Pain Score  7    Pain Location  Back    Pain Orientation  Lower;Right    Pain Descriptors / Indicators  Tightness    Pain Type  Chronic pain    Pain Radiating Towards  intermittently into right leg (buttock and thigh)    Pain Onset  More than a month ago    Pain Frequency  Constant    Aggravating Factors   activity    Pain Relieving Factors  "popping" back    Effect of Pain on Daily Activities  limits tolerance IADLs, chores         [facility] PT Assessment - 07/01/18 0001      Assessment   Medical Diagnosis  LBP with right sciatica, right neck and shoulder pain  Referring Provider (PT)  Charlott Rakes, MD    Onset Date/Surgical Date  --   referral 06/22/18, chronic symptom history   Hand Dominance  Right    Prior Therapy  --   reports tried PT for LBP around 8 years ago     Precautions   Precautions  None      Restrictions   Weight Bearing Restrictions  No      Balance Screen   Has the patient fallen in the past 6 months  No      Prior Function   Level of Independence  Independent with basic ADLs      Cognition   Overall Cognitive Status  Within Functional Limits for tasks assessed      Observation/Other Assessments   Observations  --   no innominate rotation noted   Focus on Therapeutic Outcomes (FOTO)   61% limited      Sensation   Light Touch  --   grossly intact bilat. LE dermatomes     Posture/Postural Control   Posture Comments  Rounded shoulders      ROM / Strength   AROM / PROM / Strength  AROM;Strength      AROM   Overall AROM Comments  Shoulder and hip AROM grossly WFL bilat.    AROM  Assessment Site  Shoulder;Cervical;Lumbar    Right/Left Shoulder  Right;Left    Cervical Flexion  22    Cervical Extension  30    Cervical - Right Side Bend  32    Cervical - Left Side Bend  35    Cervical - Right Rotation  55    Cervical - Left Rotation  70    Lumbar Flexion  90    Lumbar Extension  20   increased local LBP   Lumbar - Right Side Bend  40    Lumbar - Left Side Bend  40    Lumbar - Right Rotation  WFL    Lumbar - Left Rotation  [facility]      Strength   Overall Strength Comments  Bilat. UE grossly 5/5, bilat. hip abduction and extension 4/5 otherwise LE grossly 5/5      Flexibility   Soft Tissue Assessment /Muscle Length  --   mild hamstring tightness bilat., SLR 70 deg     Palpation   Palpation comment  tender to palpation with trigger point right upper trapezius region, sore/tender right lumbar paraspinals L2-3 region      Special Tests   Other special tests  --   pain w/SLR but appeared associated with hamstring tightness               Objective measurements completed on examination: See above findings.      [place] Adult PT Treatment/Exercise - 07/01/18 0001      Exercises   Exercises  Neck      Neck Exercises: Seated   Other Seated Exercise  brief HEP instruction right upper trapezius stretch and scapular retraction      Modalities   Modalities  Moist Heat      Moist Heat Therapy   Number Minutes Moist Heat  10 Minutes    Moist Heat Location  Cervical   right upper trapezius region in sitting      Trigger Point Dry Needling - 07/01/18 0001    Consent Given?  Yes    Education Handout Provided  Yes    Muscles Treated Head and Neck  Upper trapezius  Dry Needling Comments  Right upper trapezius needled in prone with 32 gauge 30 mm needle    Upper Trapezius Response  Twitch reponse elicited           PT Education - 07/01/18 1514    Education Details  etiology myofascial pain (for upper trapezius region), potential etiology lumbar  region, brief HEP, dry needling, POC    Person(s) Educated  Patient    Methods  Explanation;Demonstration;Verbal cues;Handout    Comprehension  Verbalized understanding;Returned demonstration          PT Long Term Goals - 07/01/18 1529      PT LONG TERM GOAL #1   Title  Independent with HEP    Baseline  no HEP    Time  6    Period  Weeks    Status  New    Target Date  08/12/18      PT LONG TERM GOAL #2   Title  Increase right cervical rotation AROM at least 10-15 deg to improve ability to turn head while driving    Baseline  55 deg    Time  6    Period  Weeks    Status  New    Target Date  08/12/18      PT LONG TERM GOAL #3   Title  Increase bilat. hip abd and ext strength 1/2 MMT grade to improve ability for lifting for chores    Baseline  4/5    Time  6    Period  Weeks    Status  New    Target Date  08/12/18      PT LONG TERM GOAL #4   Title  Tolerate sitting at least 30 min or greater for eating meals and car travel with neck/shoulder pain 3/10 or less    Baseline  7/10 pain with limited tolerance    Time  6    Period  Weeks    Status  New    Target Date  08/12/18      PT LONG TERM GOAL #5   Title  Perform chores, cleaning, IADLs at home with LBP 4/10 or less at worse    Baseline  7/10    Time  6    Period  Weeks    Status  New    Target Date  08/12/18             Plan - 07/01/18 1516    Clinical Impression Statement  For neck/right shoulder pain presentation consistent with right upper trapezius region myofascial pain with associated trigger points. No UE radiating/radicular symptoms noted and no pain or symptoms suggestive of local shoulder/rotator cuff pathology. For lumbar region pt. presents with lumbar and intermittent right LE radiating symptoms. Past X-ray findings of facet arthritis and today increased local lumbar pain with extension but no peripheralization of symptoms (local to lumbar region today). Pt. did have local muscle tightness and  discomfort in right lumbar paraspinals so suspect myofascial component. Pain with sitting as noted by pt. would  be consistent with disc vs. muscular etiology though no clear aggs/eases to provoke radicular symptoms. Pt. would benefit from PT to help relieve pain and address current associated functional limitations.    Personal Factors and Comorbidities  Finances;Comorbidity 1;Time since onset of injury/illness/exacerbation    Comorbidities  anxiety    Examination-Activity Limitations  Stand;Lift;Sit    Examination-Participation Restrictions  Cleaning;Shop;Community Activity    Stability/Clinical Decision Making  Evolving/Moderate complexity    Clinical  Decision Making  Moderate    Rehab Potential  Fair    PT Frequency  2x / week    PT Duration  6 weeks    PT Treatment/Interventions  Spinal Manipulations;Taping;Dry needling;Manual techniques;ADLs/Self Care Home Management;Cryotherapy;Ultrasound;Traction;Moist Heat;Electrical Stimulation;Therapeutic activities;Therapeutic exercise;Neuromuscular re-education;Functional mobility training;Patient/family education    PT Next Visit Plan  Check response dry needling (sore at eval) and perform again as needed right upper trapezius and lumbar region, for lumbar potential trial directional preference with POE vs.. trial gentle flexion bias in supine/hooklying pending response, STM right upper trapezius and lumbar paraspinals prn, add lumbar HEP as needed    PT Home Exercise Plan  time for neck HEP only with right upper trap stretch and scapular retraction    Consulted and Agree with Plan of Care  Patient       Patient will benefit from skilled therapeutic intervention in order to improve the following deficits and impairments:  Pain, Obesity, Decreased range of motion, Decreased strength, Increased muscle spasms, Postural dysfunction, Decreased activity tolerance  Visit Diagnosis: Chronic right-sided low back pain with right-sided sciatica - Plan: PT plan  of care cert/re-cert  Cervicalgia - Plan: PT plan of care cert/re-cert  Chronic right shoulder pain - Plan: PT plan of care cert/re-cert     Problem List Patient Active Problem List   Diagnosis Date Noted  . Vitamin D deficiency 03/23/2018  . Hypertension   . Anxiety   . Hyperlipidemia   . GERD (gastroesophageal reflux disease) 08/10/2014  . HTN (hypertension) 01/25/2014  . Chest pain 12/08/2013  . Murmur 12/08/2013    Beaulah Dinning, PT, DPT 07/01/18 8:46 PM  [facility] [address] [place], [place], [address] Phone: [phone]   Fax:  [phone]  Name: Christina Burke MRN: [id number] Date of Birth: 1960-11-09

## 2018-07-01 NOTE — Therapy (Signed)
[place], [place], [address] Phone: [phone]   Fax:  [phone]  Physical Therapy Evaluation  Patient Details  Name: Christina Burke MRN: [id number] Date of Birth: 12/13/1960 Referring Provider (PT): Charlott Rakes, MD   Encounter Date: 07/01/2018  PT End of Session - 07/01/18 1515    Visit Number  1    Number of Visits  12    Date for PT Re-Evaluation  08/12/18    Authorization Type  CAFA exp 10/26/18    PT Start Time  1143    PT Stop Time  1242    PT Time Calculation (min)  59 min    Activity Tolerance  Patient limited by pain   soreness with dry needling   Behavior During Therapy  [facility] for tasks assessed/performed       Past Medical History:  Diagnosis Date  . Anxiety   . Cancer ([place])    pt. reports had uterine cancer x 10 years ago  . Chest pain 12/08/2013  . GERD (gastroesophageal reflux disease) 08/10/2014  . Hyperlipidemia   . Hypertension   . Murmur 12/08/2013    Past Surgical History:  Procedure Laterality Date  . ABDOMINAL HYSTERECTOMY    . ABDOMINAL SURGERY      There were no vitals filed for this visit.   Subjective Assessment - 07/01/18 1149    Subjective  Pt. is a 58 y/o female referred to PT with c/o LBP with intermittent radiating into right proximal thigh as well as right neck and shoulder pain localized to right upper trapezius region. Pt. reports 8-10 year history of symptoms with insidious onset and attributes possible association with multiple years of performing heavy physical work for [employer].     Pertinent History  chronic pain history, anxiety, reports tried PT a number of years ago without benefit, has also tried chiropractic    Limitations  Sitting;House hold activities;Lifting;Standing;Walking    Patient Stated Goals  Help with pain    Currently in Pain?  Yes    Pain Score  7     Pain Location  Neck   right upper trapezius region   Pain Orientation  Right     Pain Descriptors / Indicators  Tightness    Pain Type  Chronic pain    Pain Radiating Towards  right upper trapezius region    Pain Onset  More than a month ago    Pain Frequency  Constant    Aggravating Factors   sitting    Pain Relieving Factors  massage    Effect of Pain on Daily Activities  limits positional tolerance    Multiple Pain Sites  Yes    Pain Score  7    Pain Location  Back    Pain Orientation  Lower;Right    Pain Descriptors / Indicators  Tightness    Pain Type  Chronic pain    Pain Radiating Towards  intermittently into right leg (buttock and thigh)    Pain Onset  More than a month ago    Pain Frequency  Constant    Aggravating Factors   activity    Pain Relieving Factors  "popping" back    Effect of Pain on Daily Activities  limits tolerance IADLs, chores         [facility] PT Assessment - 07/01/18 0001      Assessment   Medical Diagnosis  LBP with right sciatica, right neck and shoulder pain  Referring Provider (PT)  Charlott Rakes, MD    Onset Date/Surgical Date  --   referral 06/22/18, chronic symptom history   Hand Dominance  Right    Prior Therapy  --   reports tried PT for LBP around 8 years ago     Precautions   Precautions  None      Restrictions   Weight Bearing Restrictions  No      Balance Screen   Has the patient fallen in the past 6 months  No      Prior Function   Level of Independence  Independent with basic ADLs      Cognition   Overall Cognitive Status  Within Functional Limits for tasks assessed      Observation/Other Assessments   Observations  --   no innominate rotation noted   Focus on Therapeutic Outcomes (FOTO)   61% limited      Sensation   Light Touch  --   grossly intact bilat. LE dermatomes     Posture/Postural Control   Posture Comments  Rounded shoulders      ROM / Strength   AROM / PROM / Strength  AROM;Strength      AROM   Overall AROM Comments  Shoulder and hip AROM grossly WFL bilat.    AROM  Assessment Site  Shoulder;Cervical;Lumbar    Right/Left Shoulder  Right;Left    Cervical Flexion  22    Cervical Extension  30    Cervical - Right Side Bend  32    Cervical - Left Side Bend  35    Cervical - Right Rotation  55    Cervical - Left Rotation  70    Lumbar Flexion  90    Lumbar Extension  20   increased local LBP   Lumbar - Right Side Bend  40    Lumbar - Left Side Bend  40    Lumbar - Right Rotation  WFL    Lumbar - Left Rotation  [facility]      Strength   Overall Strength Comments  Bilat. UE grossly 5/5, bilat. hip abduction and extension 4/5 otherwise LE grossly 5/5      Flexibility   Soft Tissue Assessment /Muscle Length  --   mild hamstring tightness bilat., SLR 70 deg     Palpation   Palpation comment  tender to palpation with trigger point right upper trapezius region, sore/tender right lumbar paraspinals L2-3 region      Special Tests   Other special tests  --   pain w/SLR but appeared associated with hamstring tightness               Objective measurements completed on examination: See above findings.      [place] Adult PT Treatment/Exercise - 07/01/18 0001      Exercises   Exercises  Neck      Neck Exercises: Seated   Other Seated Exercise  brief HEP instruction right upper trapezius stretch and scapular retraction      Modalities   Modalities  Moist Heat      Moist Heat Therapy   Number Minutes Moist Heat  10 Minutes    Moist Heat Location  Cervical   right upper trapezius region in sitting      Trigger Point Dry Needling - 07/01/18 0001    Consent Given?  Yes    Education Handout Provided  Yes    Muscles Treated Head and Neck  Upper trapezius  Dry Needling Comments  Right upper trapezius needled in prone with 32 gauge 30 mm needle    Upper Trapezius Response  Twitch reponse elicited           PT Education - 07/01/18 1514    Education Details  etiology myofascial pain (for upper trapezius region), potential etiology lumbar  region, brief HEP, dry needling, POC    Person(s) Educated  Patient    Methods  Explanation;Demonstration;Verbal cues;Handout    Comprehension  Verbalized understanding;Returned demonstration          PT Long Term Goals - 07/01/18 1529      PT LONG TERM GOAL #1   Title  Independent with HEP    Baseline  no HEP    Time  6    Period  Weeks    Status  New    Target Date  08/12/18      PT LONG TERM GOAL #2   Title  Increase right cervical rotation AROM at least 10-15 deg to improve ability to turn head while driving    Baseline  55 deg    Time  6    Period  Weeks    Status  New    Target Date  08/12/18      PT LONG TERM GOAL #3   Title  Increase bilat. hip abd and ext strength 1/2 MMT grade to improve ability for lifting for chores    Baseline  4/5    Time  6    Period  Weeks    Status  New    Target Date  08/12/18      PT LONG TERM GOAL #4   Title  Tolerate sitting at least 30 min or greater for eating meals and car travel with neck/shoulder pain 3/10 or less    Baseline  7/10 pain with limited tolerance    Time  6    Period  Weeks    Status  New    Target Date  08/12/18      PT LONG TERM GOAL #5   Title  Perform chores, cleaning, IADLs at home with LBP 4/10 or less at worse    Baseline  7/10    Time  6    Period  Weeks    Status  New    Target Date  08/12/18             Plan - 07/01/18 1516    Clinical Impression Statement  For neck/right shoulder pain presentation consistent with right upper trapezius region myofascial pain with associated trigger points. No UE radiating/radicular symptoms noted and no pain or symptoms suggestive of local shoulder/rotator cuff pathology. For lumbar region pt. presents with lumbar and intermittent right LE radiating symptoms. Past X-ray findings of facet arthritis and today increased local lumbar pain with extension but no peripheralization of symptoms (local to lumbar region today). Pt. did have local muscle tightness and  discomfort in right lumbar paraspinals so suspect myofascial component. Pain with sitting as noted by pt. would  be consistent with disc vs. muscular etiology though no clear aggs/eases to provoke radicular symptoms. Pt. would benefit from PT to help relieve pain and address current associated functional limitations.    Personal Factors and Comorbidities  Finances;Comorbidity 1;Time since onset of injury/illness/exacerbation    Comorbidities  anxiety    Examination-Activity Limitations  Stand;Lift;Sit    Examination-Participation Restrictions  Cleaning;Shop;Community Activity    Stability/Clinical Decision Making  Evolving/Moderate complexity    Clinical  Decision Making  Moderate    Rehab Potential  Fair    PT Frequency  2x / week    PT Duration  6 weeks    PT Treatment/Interventions  Spinal Manipulations;Taping;Dry needling;Manual techniques;ADLs/Self Care Home Management;Cryotherapy;Ultrasound;Traction;Moist Heat;Electrical Stimulation;Therapeutic activities;Therapeutic exercise;Neuromuscular re-education;Functional mobility training;Patient/family education    PT Next Visit Plan  Check response dry needling (sore at eval) and perform again as needed right upper trapezius and lumbar region, for lumbar potential trial directional preference with POE vs.. trial gentle flexion bias in supine/hooklying pending response, STM right upper trapezius and lumbar paraspinals prn, add lumbar HEP as needed    PT Home Exercise Plan  time for neck HEP only with right upper trap stretch and scapular retraction    Consulted and Agree with Plan of Care  Patient       Patient will benefit from skilled therapeutic intervention in order to improve the following deficits and impairments:  Pain, Obesity, Decreased range of motion, Decreased strength, Increased muscle spasms, Postural dysfunction, Decreased activity tolerance  Visit Diagnosis: Chronic right-sided low back pain with right-sided  sciatica  Cervicalgia  Chronic right shoulder pain     Problem List Patient Active Problem List   Diagnosis Date Noted  . Vitamin D deficiency 03/23/2018  . Hypertension   . Anxiety   . Hyperlipidemia   . GERD (gastroesophageal reflux disease) 08/10/2014  . HTN (hypertension) 01/25/2014  . Chest pain 12/08/2013  . Murmur 12/08/2013    Beaulah Dinning, PT, DPT 07/01/18 3:35 PM  [facility] [address] [place], [place], [address] Phone: [phone]   Fax:  [phone]  Name: Alexsus Papadopoulos MRN: [id number] Date of Birth: 1960/05/21

## 2018-07-02 ENCOUNTER — Telehealth: Payer: Self-pay

## 2018-07-02 NOTE — Telephone Encounter (Signed)
Patient was called and informed of lab results. Patient had no questions.  

## 2018-07-02 NOTE — Telephone Encounter (Signed)
-----   Message from Charlott Rakes, MD sent at 06/23/2018  6:28 PM EST ----- Please inform the patient that labs are normal. Thank you.

## 2018-07-06 ENCOUNTER — Ambulatory Visit: Payer: Self-pay | Admitting: Physical Therapy

## 2018-07-06 ENCOUNTER — Encounter: Payer: Self-pay | Admitting: Physical Therapy

## 2018-07-06 DIAGNOSIS — M542 Cervicalgia: Secondary | ICD-10-CM

## 2018-07-06 DIAGNOSIS — M25511 Pain in right shoulder: Secondary | ICD-10-CM

## 2018-07-06 DIAGNOSIS — M5441 Lumbago with sciatica, right side: Principal | ICD-10-CM

## 2018-07-06 DIAGNOSIS — G8929 Other chronic pain: Secondary | ICD-10-CM

## 2018-07-06 NOTE — Therapy (Signed)
[place], [place], [address] Phone: [phone]   Fax:  [phone]  Physical Therapy Treatment  Patient Details  Name: Christina Burke MRN: [id number] Date of Birth: 1960-06-09 Referring Provider (PT): Charlott Rakes, MD   Encounter Date: 07/06/2018  PT End of Session - 07/06/18 1018    Visit Number  2    Number of Visits  12    Date for PT Re-Evaluation  08/12/18    Authorization Type  CAFA exp 10/26/18    PT Start Time  1018    PT Stop Time  1112    PT Time Calculation (min)  54 min    Activity Tolerance  Patient limited by pain       Past Medical History:  Diagnosis Date  . Anxiety   . Cancer ([place])    pt. reports had uterine cancer x 10 years ago  . Chest pain 12/08/2013  . GERD (gastroesophageal reflux disease) 08/10/2014  . Hyperlipidemia   . Hypertension   . Murmur 12/08/2013    Past Surgical History:  Procedure Laterality Date  . ABDOMINAL HYSTERECTOMY    . ABDOMINAL SURGERY      There were no vitals filed for this visit.  Subjective Assessment - 07/06/18 1018    Subjective  Pt reports she was hurting awful for three days all along the back both side.     Patient Stated Goals  Help with pain    Currently in Pain?  Yes    Pain Score  7     Pain Location  Back    Pain Orientation  --   all over both sides,    Pain Descriptors / Indicators  Tightness;Sore    Pain Type  Chronic pain    Aggravating Factors   the DN flared up a lot    Pain Relieving Factors  nothing for now                       [facility] Adult PT Treatment/Exercise - 07/06/18 0001      Neck Exercises: Supine   Other Supine Exercise  10 reps each decompression exercise series per handout      Modalities   Modalities  Moist Heat      Moist Heat Therapy   Number Minutes Moist Heat  15 Minutes    Moist Heat Location  Cervical;Lumbar Spine   thoracic     Manual Therapy   Manual Therapy  Soft  tissue mobilization    Manual therapy comments  skilled palpation and montinoring during DN    Soft tissue mobilization  STM to Bilat upper traps, along spine all of the paraspinals.        Trigger Point Dry Needling - 07/06/18 0001    Consent Given?  Yes    Education Handout Provided  No    Muscles Treated Head and Neck  Upper trapezius;Levator scapulae;Cervical multifidi    Upper Trapezius Response  Twitch reponse elicited;Palpable increased muscle length   rt   Levator Scapulae Response  Palpable increased muscle length;Twitch response elicited   rt   Cervical multifidi Response  Palpable increased muscle length;Twitch reponse elicited   rt Q1-1          PT Education - 07/06/18 1029    Education Details  HEP     Person(s) Educated  Patient    Methods  Explanation;Demonstration;Handout    Comprehension  Verbal cues required;Returned demonstration;Verbalized understanding  PT Long Term Goals - 07/01/18 1529      PT LONG TERM GOAL #1   Title  Independent with HEP    Baseline  no HEP    Time  6    Period  Weeks    Status  New    Target Date  08/12/18      PT LONG TERM GOAL #2   Title  Increase right cervical rotation AROM at least 10-15 deg to improve ability to turn head while driving    Baseline  55 deg    Time  6    Period  Weeks    Status  New    Target Date  08/12/18      PT LONG TERM GOAL #3   Title  Increase bilat. hip abd and ext strength 1/2 MMT grade to improve ability for lifting for chores    Baseline  4/5    Time  6    Period  Weeks    Status  New    Target Date  08/12/18      PT LONG TERM GOAL #4   Title  Tolerate sitting at least 30 min or greater for eating meals and car travel with neck/shoulder pain 3/10 or less    Baseline  7/10 pain with limited tolerance    Time  6    Period  Weeks    Status  New    Target Date  08/12/18      PT LONG TERM GOAL #5   Title  Perform chores, cleaning, IADLs at home with LBP 4/10 or less at  worse    Baseline  7/10    Time  6    Period  Weeks    Status  New    Target Date  08/12/18            Plan - 07/06/18 1146    Clinical Impression Statement  This was Christina Burke's second visit, she had a lot of increase in overall back pain after her last visit.  She was willing to have DN again and had decreased palpable tightness after in that area.  Back anatomy explained to pt with understanding that all the muscles are attached and run the length of the back so sometimes pain can move around and the most important thing for her was to keep moving.  She did well with decompression exercise routine.  Will need to be progressed slowly as she perseverates on her symtpoms.     Rehab Potential  Fair    PT Frequency  2x / week    PT Duration  6 weeks    PT Treatment/Interventions  Spinal Manipulations;Taping;Dry needling;Manual techniques;ADLs/Self Care Home Management;Cryotherapy;Ultrasound;Traction;Moist Heat;Electrical Stimulation;Therapeutic activities;Therapeutic exercise;Neuromuscular re-education;Functional mobility training;Patient/family education    PT Next Visit Plan  encourage movement/activity assess response to second DN tx.     Consulted and Agree with Plan of Care  Patient       Patient will benefit from skilled therapeutic intervention in order to improve the following deficits and impairments:  Pain, Obesity, Decreased range of motion, Decreased strength, Increased muscle spasms, Postural dysfunction, Decreased activity tolerance  Visit Diagnosis: Chronic right-sided low back pain with right-sided sciatica  Cervicalgia  Chronic right shoulder pain     Problem List Patient Active Problem List   Diagnosis Date Noted  . Vitamin D deficiency 03/23/2018  . Hypertension   . Anxiety   . Hyperlipidemia   . GERD (gastroesophageal reflux disease) 08/10/2014  .  HTN (hypertension) 01/25/2014  . Chest pain 12/08/2013  . Murmur 12/08/2013    Jeral Pinch PT 07/06/2018,  11:49 AM  [facility] [address] [place], [place], [address] Phone: [phone]   Fax:  [phone]  Name: Christina Burke MRN: [id number] Date of Birth: 07-19-1960

## 2018-07-06 NOTE — Patient Instructions (Signed)
Decompression Exercise: Basic   Lie on back on firm surface, knees bent, feet flat, arms turned up, out to sides, backs of hands down. Time _3-5__ minutes. Surface: floor  Shoulder Press   Press both shoulders down. Hold __3-5_ seconds. Repeat _10__ times. Press one shoulder down. Hold _3-5__ seconds Repeat _10__ times. Do other shoulder. If unable to press one or both shoulders, lie in position a few sessions until you can. Surface: floor   Head Press With Chin Tuck   Tuck chin SLIGHTLY toward chest, keep mouth closed. Feel weight on back of head. Increase weight by pressing head down. Hold _3-5__ seconds. Relax. Repeat _10__ times. Surface: floor   Leg Lengthener: Full   Straighten one leg. Pull toes AND forefoot toward knee, extend heel. Lengthen leg by pulling pelvis away from ribs. Hold _3-5__ seconds. Relax. Repeat 1 time. Re-bend knee. Do other leg. Each leg _10__ times. Surface: floor   Leg Press: Single   Straighten one leg down to floor. Bring toes AND forefoot toward knee, extend heel. Press leg down. DO NOT BEND KNEE. Hold _3-5__ seconds. Relax leg. Repeat exercise 1 time. Relax leg. Re-bend knee. Repeat with other leg. Each leg _10_ times.

## 2018-07-14 ENCOUNTER — Encounter: Payer: Self-pay | Admitting: Physical Therapy

## 2018-07-14 ENCOUNTER — Ambulatory Visit: Payer: Self-pay | Admitting: Physical Therapy

## 2018-07-14 ENCOUNTER — Other Ambulatory Visit: Payer: Self-pay

## 2018-07-14 DIAGNOSIS — M25511 Pain in right shoulder: Secondary | ICD-10-CM

## 2018-07-14 DIAGNOSIS — M542 Cervicalgia: Secondary | ICD-10-CM

## 2018-07-14 DIAGNOSIS — M5441 Lumbago with sciatica, right side: Principal | ICD-10-CM

## 2018-07-14 DIAGNOSIS — G8929 Other chronic pain: Secondary | ICD-10-CM

## 2018-07-14 NOTE — Patient Instructions (Signed)

## 2018-07-14 NOTE — Therapy (Addendum)
[place], [place], [address] Phone: [phone]   Fax:  [phone]  Physical Therapy Treatment/[facility]   Patient Details  Name: Christina Burke MRN: [id number] Date of Birth: [birth year] Referring Provider (PT): Charlott Rakes, MD   Encounter Date: 07/14/2018  PT End of Session - 07/14/18 0929    Visit Number  3    Number of Visits  12    Date for PT Re-Evaluation  08/12/18    Authorization Type  CAFA exp 10/26/18    PT Start Time  0929    PT Stop Time  1031    PT Time Calculation (min)  62 min    Activity Tolerance  Patient tolerated treatment well    Behavior During Therapy  Anxious       Past Medical History:  Diagnosis Date  . Anxiety   . Cancer ([place])    pt. reports had uterine cancer x 10 years ago  . Chest pain 12/08/2013  . GERD (gastroesophageal reflux disease) 08/10/2014  . Hyperlipidemia   . Hypertension   . Murmur 12/08/2013    Past Surgical History:  Procedure Laterality Date  . ABDOMINAL HYSTERECTOMY    . ABDOMINAL SURGERY      There were no vitals filed for this visit.  Subjective Assessment - 07/14/18 0930    Subjective  Pt reports she was sore after however felt better a couple days later. Then it tightned back up again    Patient Stated Goals  Help with pain    Currently in Pain?  Yes    Pain Score  7     Pain Location  Neck    Pain Orientation  Right;Left   Rt worse   Pain Descriptors / Indicators  Tightness;Sore    Pain Type  Chronic pain    Pain Onset  More than a month ago    Pain Frequency  Constant    Aggravating Factors   sitting especially for low back     Pain Relieving Factors  some of the gentle exercise.                        [facility] Adult PT Treatment/Exercise - 07/14/18 0001      Neck Exercises: Supine   Shoulder Flexion  Both;10 reps   with stretch at end   Shoulder ABduction  Both;20 reps   horizontal abduction - yellow band.     Other Supine Exercise  10 reps bridges,SKTC x 20 sec      Modalities   Modalities  Moist Heat      Moist Heat Therapy   Number Minutes Moist Heat  15 Minutes    Moist Heat Location  Cervical;Lumbar Spine   thoracic     Manual Therapy   Manual Therapy  Soft tissue mobilization    Manual therapy comments  skilled palpation and montinoring during DN    Soft tissue mobilization  STM to Bilat upper traps, along spine all of the paraspinals.        Trigger Point Dry Needling - 07/14/18 0001    Consent Given?  Yes    Education Handout Provided  No    Muscles Treated Head and Neck  Upper trapezius    Electrical Stimulation Performed with Dry Needling  Yes   to bilat upper traps   Upper Trapezius Response  Palpable increased muscle length;Twitch reponse elicited   bilat  PT Long Term Goals - 07/01/18 1529      PT LONG TERM GOAL #1   Title  Independent with HEP    Baseline  no HEP    Time  6    Period  Weeks    Status  New    Target Date  08/12/18      PT LONG TERM GOAL #2   Title  Increase right cervical rotation AROM at least 10-15 deg to improve ability to turn head while driving    Baseline  55 deg    Time  6    Period  Weeks    Status  New    Target Date  08/12/18      PT LONG TERM GOAL #3   Title  Increase bilat. hip abd and ext strength 1/2 MMT grade to improve ability for lifting for chores    Baseline  4/5    Time  6    Period  Weeks    Status  New    Target Date  08/12/18      PT LONG TERM GOAL #4   Title  Tolerate sitting at least 30 min or greater for eating meals and car travel with neck/shoulder pain 3/10 or less    Baseline  7/10 pain with limited tolerance    Time  6    Period  Weeks    Status  New    Target Date  08/12/18      PT LONG TERM GOAL #5   Title  Perform chores, cleaning, IADLs at home with LBP 4/10 or less at worse    Baseline  7/10    Time  6    Period  Weeks    Status  New    Target Date  08/12/18             Plan - 07/14/18 1006    Clinical Impression Statement  Christina Burke had a couple days with decreased pain. Her progress will be slow due to cormibidities and fear.  She had decreased palpable tightness through her upper shoulder and neck muscles.  Posture plays a factor in this.     Rehab Potential  Fair    PT Frequency  2x / week    PT Duration  6 weeks    PT Next Visit Plan  encourage movement/activity assess response to second DN tx.        Patient will benefit from skilled therapeutic intervention in order to improve the following deficits and impairments:  Pain, Obesity, Decreased range of motion, Decreased strength, Increased muscle spasms, Postural dysfunction, Decreased activity tolerance  Visit Diagnosis: Chronic right-sided low back pain with right-sided sciatica  Cervicalgia  Chronic right shoulder pain  PHYSICAL THERAPY DISCHARGE SUMMARY  Visits from Start of Care: 3  Current functional level related to goals / functional outcomes: Did not come for follow up 2nd to covid-19 clinic shut down    Remaining deficits: Unknown    Education / Equipment: Unknown   Plan: Patient agrees to discharge.  Patient goals were not met. Patient is being discharged due to not returning since the last visit.  ?????       Problem List Patient Active Problem List   Diagnosis Date Noted  . Vitamin D deficiency 03/23/2018  . Hypertension   . Anxiety   . Hyperlipidemia   . GERD (gastroesophageal reflux disease) 08/10/2014  . HTN (hypertension) 01/25/2014  . Chest pain 12/08/2013  . Murmur 12/08/2013  Christina Burke PT DPT  07/14/2018   Christina Burke PT  07/14/2018, 10:17 AM  [facility] [address] [place], [place], [address] Phone: [phone]   Fax:  [phone]  Name: Christina Burke MRN: [id number] Date of Birth: Nov 29, 1960

## 2018-07-16 ENCOUNTER — Ambulatory Visit: Payer: Self-pay | Admitting: Physical Therapy

## 2018-07-20 ENCOUNTER — Ambulatory Visit: Payer: Self-pay | Admitting: Physical Therapy

## 2018-07-22 ENCOUNTER — Ambulatory Visit: Payer: Self-pay | Admitting: Physical Therapy

## 2018-07-27 ENCOUNTER — Ambulatory Visit: Payer: Self-pay | Admitting: Physical Therapy

## 2018-07-29 ENCOUNTER — Ambulatory Visit: Payer: Medicaid Other | Admitting: Physical Therapy

## 2018-08-03 ENCOUNTER — Encounter: Payer: Self-pay | Admitting: Physical Therapy

## 2018-08-03 ENCOUNTER — Telehealth: Payer: Self-pay | Admitting: Physical Therapy

## 2018-08-03 NOTE — Telephone Encounter (Signed)
Attempted to call patient 2x regarding temporary clinic closure 2nd to covid-19. The patient was contacted 2x with no answer. Therapy will follow up when able.

## 2018-08-12 ENCOUNTER — Encounter: Payer: Medicaid Other | Admitting: Physical Therapy

## 2018-08-26 MED FILL — BENAZEPRIL HCL 20 MG TABLET: 20 | 30 days supply | Qty: 30 | Fill #2

## 2018-10-28 ENCOUNTER — Ambulatory Visit: Payer: Self-pay | Attending: Family Medicine

## 2018-10-28 ENCOUNTER — Other Ambulatory Visit: Payer: Self-pay

## 2018-10-28 ENCOUNTER — Ambulatory Visit: Payer: Medicaid Other

## 2018-11-01 MED FILL — METHOCARBAMOL 500 MG TABS: 500 | 20 days supply | Qty: 60 | Fill #1

## 2018-11-01 MED FILL — BENAZEPRIL HCL 20 MG TABLET: 20 | 30 days supply | Qty: 30 | Fill #3

## 2018-11-01 MED FILL — ?DULoxetine HCL 60 MG CPEP: 60 | 30 days supply | Qty: 30 | Fill #1

## 2018-11-01 MED FILL — MELOXICAM 7.5 MG TABLET: 7.5 | 30 days supply | Qty: 30 | Fill #1

## 2018-11-23 ENCOUNTER — Encounter: Payer: Self-pay | Admitting: Family Medicine

## 2018-11-23 ENCOUNTER — Other Ambulatory Visit: Payer: Self-pay

## 2018-11-23 ENCOUNTER — Telehealth: Payer: Self-pay | Admitting: Family Medicine

## 2018-11-23 ENCOUNTER — Ambulatory Visit: Payer: Self-pay | Attending: Family Medicine | Admitting: Family Medicine

## 2018-11-23 VITALS — BP 121/80 | HR 71 | Ht 63.5 in | Wt 199.6 lb

## 2018-11-23 DIAGNOSIS — Z6834 Body mass index (BMI) 34.0-34.9, adult: Secondary | ICD-10-CM

## 2018-11-23 DIAGNOSIS — G8929 Other chronic pain: Secondary | ICD-10-CM

## 2018-11-23 DIAGNOSIS — I10 Essential (primary) hypertension: Secondary | ICD-10-CM

## 2018-11-23 DIAGNOSIS — E6609 Other obesity due to excess calories: Secondary | ICD-10-CM

## 2018-11-23 DIAGNOSIS — Z131 Encounter for screening for diabetes mellitus: Secondary | ICD-10-CM

## 2018-11-23 DIAGNOSIS — M5441 Lumbago with sciatica, right side: Secondary | ICD-10-CM

## 2018-11-23 DIAGNOSIS — M62838 Other muscle spasm: Secondary | ICD-10-CM

## 2018-11-23 LAB — POCT GLYCOSYLATED HEMOGLOBIN (HGB A1C): HbA1c, POC (prediabetic range): 5.6 % — AB (ref 5.7–6.4)

## 2018-11-23 MED ORDER — METHOCARBAMOL 500 MG PO TABS: 500.0000 mg | ORAL_TABLET | Freq: Three times a day (TID) | ORAL | 2 refills | Status: DC | PRN

## 2018-11-23 MED ORDER — BENAZEPRIL HCL 20 MG PO TABS: 20.0000 mg | ORAL_TABLET | Freq: Every day | ORAL | 6 refills | Status: DC

## 2018-11-23 MED ORDER — DULOXETINE HCL 60 MG PO CPEP: 60.0000 mg | ORAL_CAPSULE | Freq: Every day | ORAL | 3 refills | Status: DC

## 2018-11-23 MED ORDER — MELOXICAM 7.5 MG PO TABS: 7.5000 mg | ORAL_TABLET | Freq: Every day | ORAL | 2 refills | Status: DC

## 2018-11-23 MED FILL — METHOCARBAMOL 500 MG TABS: 500 | 20 days supply | Qty: 60 | Fill #0

## 2018-11-23 NOTE — Patient Instructions (Signed)
° °Calorie Counting for Weight Loss °Calories are units of energy. Your body needs a certain amount of calories from food to keep you going throughout the day. When you eat more calories than your body needs, your body stores the extra calories as fat. When you eat fewer calories than your body needs, your body burns fat to get the energy it needs. °Calorie counting means keeping track of how many calories you eat and drink each day. Calorie counting can be helpful if you need to lose weight. If you make sure to eat fewer calories than your body needs, you should lose weight. Ask your health care provider what a healthy weight is for you. °For calorie counting to work, you will need to eat the right number of calories in a day in order to lose a healthy amount of weight per week. A dietitian can help you determine how many calories you need in a day and will give you suggestions on how to reach your calorie goal. °· A healthy amount of weight to lose per week is usually 1-2 lb (0.5-0.9 kg). This usually means that your daily calorie intake should be reduced by 500-750 calories. °· Eating 1,200 - 1,500 calories per day can help most women lose weight. °· Eating 1,500 - 1,800 calories per day can help most men lose weight. °What is my plan? °My goal is to have __________ calories per day. °If I have this many calories per day, I should lose around __________ pounds per week. °What do I need to know about calorie counting? °In order to meet your daily calorie goal, you will need to: °· Find out how many calories are in each food you would like to eat. Try to do this before you eat. °· Decide how much of the food you plan to eat. °· Write down what you ate and how many calories it had. Doing this is called keeping a food log. °To successfully lose weight, it is important to balance calorie counting with a healthy lifestyle that includes regular activity. Aim for 150 minutes of moderate exercise (such as walking) or 75  minutes of vigorous exercise (such as running) each week. °Where do I find calorie information? ° °The number of calories in a food can be found on a Nutrition Facts label. If a food does not have a Nutrition Facts label, try to look up the calories online or ask your dietitian for help. °Remember that calories are listed per serving. If you choose to have more than one serving of a food, you will have to multiply the calories per serving by the amount of servings you plan to eat. For example, the label on a package of bread might say that a serving size is 1 slice and that there are 90 calories in a serving. If you eat 1 slice, you will have eaten 90 calories. If you eat 2 slices, you will have eaten 180 calories. °How do I keep a food log? °Immediately after each meal, record the following information in your food log: °· What you ate. Don't forget to include toppings, sauces, and other extras on the food. °· How much you ate. This can be measured in cups, ounces, or number of items. °· How many calories each food and drink had. °· The total number of calories in the meal. °Keep your food log near you, such as in a small notebook in your pocket, or use a mobile app or website. Some programs will   calculate calories for you and show you how many calories you have left for the day to meet your goal. °What are some calorie counting tips? ° °· Use your calories on foods and drinks that will fill you up and not leave you hungry: °? Some examples of foods that fill you up are nuts and nut butters, vegetables, lean proteins, and high-fiber foods like whole grains. High-fiber foods are foods with more than 5 g fiber per serving. °? Drinks such as sodas, specialty coffee drinks, alcohol, and juices have a lot of calories, yet do not fill you up. °· Eat nutritious foods and avoid empty calories. Empty calories are calories you get from foods or beverages that do not have many vitamins or protein, such as candy, sweets, and  soda. It is better to have a nutritious high-calorie food (such as an avocado) than a food with few nutrients (such as a bag of chips). °· Know how many calories are in the foods you eat most often. This will help you calculate calorie counts faster. °· Pay attention to calories in drinks. Low-calorie drinks include water and unsweetened drinks. °· Pay attention to nutrition labels for "low fat" or "fat free" foods. These foods sometimes have the same amount of calories or more calories than the full fat versions. They also often have added sugar, starch, or salt, to make up for flavor that was removed with the fat. °· Find a way of tracking calories that works for you. Get creative. Try different apps or programs if writing down calories does not work for you. °What are some portion control tips? °· Know how many calories are in a serving. This will help you know how many servings of a certain food you can have. °· Use a measuring cup to measure serving sizes. You could also try weighing out portions on a kitchen scale. With time, you will be able to estimate serving sizes for some foods. °· Take some time to put servings of different foods on your favorite plates, bowls, and cups so you know what a serving looks like. °· Try not to eat straight from a bag or box. Doing this can lead to overeating. Put the amount you would like to eat in a cup or on a plate to make sure you are eating the right portion. °· Use smaller plates, glasses, and bowls to prevent overeating. °· Try not to multitask (for example, watch TV or use your computer) while eating. If it is time to eat, sit down at a table and enjoy your food. This will help you to know when you are full. It will also help you to be aware of what you are eating and how much you are eating. °What are tips for following this plan? °Reading food labels °· Check the calorie count compared to the serving size. The serving size may be smaller than what you are used to  eating. °· Check the source of the calories. Make sure the food you are eating is high in vitamins and protein and low in saturated and trans fats. °Shopping °· Read nutrition labels while you shop. This will help you make healthy decisions before you decide to purchase your food. °· Make a grocery list and stick to it. °Cooking °· Try to cook your favorite foods in a healthier way. For example, try baking instead of frying. °· Use low-fat dairy products. °Meal planning °· Use more fruits and vegetables. Half of your plate should be   fruits and vegetables. °· Include lean proteins like poultry and fish. °How do I count calories when eating out? °· Ask for smaller portion sizes. °· Consider sharing an entree and sides instead of getting your own entree. °· If you get your own entree, eat only half. Ask for a box at the beginning of your meal and put the rest of your entree in it so you are not tempted to eat it. °· If calories are listed on the menu, choose the lower calorie options. °· Choose dishes that include vegetables, fruits, whole grains, low-fat dairy products, and lean protein. °· Choose items that are boiled, broiled, grilled, or steamed. Stay away from items that are buttered, battered, fried, or served with cream sauce. Items labeled "crispy" are usually fried, unless stated otherwise. °· Choose water, low-fat milk, unsweetened iced tea, or other drinks without added sugar. If you want an alcoholic beverage, choose a lower calorie option such as a glass of wine or light beer. °· Ask for dressings, sauces, and syrups on the side. These are usually high in calories, so you should limit the amount you eat. °· If you want a salad, choose a garden salad and ask for grilled meats. Avoid extra toppings like bacon, cheese, or fried items. Ask for the dressing on the side, or ask for olive oil and vinegar or lemon to use as dressing. °· Estimate how many servings of a food you are given. For example, a serving of  cooked rice is ½ cup or about the size of half a baseball. Knowing serving sizes will help you be aware of how much food you are eating at restaurants. The list below tells you how big or small some common portion sizes are based on everyday objects: °? 1 oz--4 stacked dice. °? 3 oz--1 deck of cards. °? 1 tsp--1 die. °? 1 Tbsp--½ a ping-pong ball. °? 2 Tbsp--1 ping-pong ball. °? ½ cup--½ baseball. °? 1 cup--1 baseball. °Summary °· Calorie counting means keeping track of how many calories you eat and drink each day. If you eat fewer calories than your body needs, you should lose weight. °· A healthy amount of weight to lose per week is usually 1-2 lb (0.5-0.9 kg). This usually means reducing your daily calorie intake by 500-750 calories. °· The number of calories in a food can be found on a Nutrition Facts label. If a food does not have a Nutrition Facts label, try to look up the calories online or ask your dietitian for help. °· Use your calories on foods and drinks that will fill you up, and not on foods and drinks that will leave you hungry. °· Use smaller plates, glasses, and bowls to prevent overeating. °This information is not intended to replace advice given to you by your health care provider. Make sure you discuss any questions you have with your health care provider. °Document Released: 04/14/2005 Document Revised: 01/01/2018 Document Reviewed: 03/14/2016 °Elsevier Patient Education © 2020 [employer] ° °

## 2018-11-23 NOTE — Progress Notes (Signed)
Subjective:  Patient ID: Christina Burke, female    DOB: [birth year]  Age: 58 y.o. MRN: [id number]  CC: Hypertension   HPI Christina Burke is a 58 year old female with history of hypertension, anxiety who presents today for follow-up visit. Anxiety is managed by Psych where she received Klonopin which has been beneficial. Compliant with her antihypertensive but not with a healthy diet or exercise ever since the Pandemic hit but she started back again yesterday on her healthy diet. She continues to have upper back pain radiating to both shoulders as well as neck pain. Symptoms had improved while she underwent PT in 06/2018 but this was placed on hold due to the COVID-19 pandemic. She has associated neck muscle tightness but no numbness.  Past Medical History:  Diagnosis Date  . Anxiety   . Cancer ([place])    pt. reports had uterine cancer x 10 years ago  . Chest pain 12/08/2013  . GERD (gastroesophageal reflux disease) 08/10/2014  . Hyperlipidemia   . Hypertension   . Murmur 12/08/2013    Past Surgical History:  Procedure Laterality Date  . ABDOMINAL HYSTERECTOMY    . ABDOMINAL SURGERY      Family History  Problem Relation Age of Onset  . Gallbladder disease Mother   . Heart disease Father   . Colon polyps Sister   . Diabetes Sister        x3  . Diabetes Brother   . Breast cancer Paternal Aunt   . Colon cancer Neg Hx   . Kidney disease Neg Hx   . Esophageal cancer Neg Hx     Allergies  Allergen Reactions  . Amoxicillin Rash    Has patient had a PCN reaction causing immediate rash, facial/tongue/throat swelling, SOB or lightheadedness with hypotension: yes Has patient had a PCN reaction causing severe rash involving mucus membranes or skin necrosis: no Has patient had a PCN reaction that required hospitalization: no Has patient had a PCN reaction occurring within the last 10 years: no If all of the above answers are "NO", then may proceed with Cephalosporin use.   Marland Kitchen  Penicillins Rash    Outpatient Medications Prior to Visit  Medication Sig Dispense Refill  . clonazePAM (KLONOPIN) 1 MG tablet Take 1 mg by mouth 2 (two) times daily.    . benazepril (LOTENSIN) 20 MG tablet Take 1 tablet (20 mg total) by mouth daily. 30 tablet 6  . DULoxetine (CYMBALTA) 60 MG capsule Take 1 capsule (60 mg total) by mouth daily. For back pain 30 capsule 3  . meloxicam (MOBIC) 7.5 MG tablet Take 1 tablet (7.5 mg total) by mouth daily. 30 tablet 2  . methocarbamol (ROBAXIN) 500 MG tablet Take 1 tablet (500 mg total) by mouth every 8 (eight) hours as needed for muscle spasms. 60 tablet 2   No facility-administered medications prior to visit.      ROS Review of Systems  Constitutional: Negative for activity change, appetite change and fatigue.  HENT: Negative for congestion, sinus pressure and sore throat.   Eyes: Negative for visual disturbance.  Respiratory: Negative for cough, chest tightness, shortness of breath and wheezing.   Cardiovascular: Negative for chest pain and palpitations.  Gastrointestinal: Negative for abdominal distention, abdominal pain and constipation.  Endocrine: Negative for polydipsia.  Genitourinary: Negative for dysuria and frequency.  Musculoskeletal: Negative for arthralgias and back pain.  Skin: Negative for rash.  Neurological: Negative for tremors, light-headedness and numbness.  Hematological: Does not bruise/bleed easily.  Psychiatric/Behavioral: Negative for  agitation and behavioral problems.    Objective:  BP 121/80   Pulse 71   Ht 5' 3.5" (1.613 m)   Wt 199 lb 9.6 oz (90.5 kg)   SpO2 98%   BMI 34.80 kg/m   BP/Weight 11/23/2018 7/86/7544 12/28/98  Systolic BP 712 197 588  Diastolic BP 80 79 98  Wt. (Lbs) 199.6 182.6 -  BMI 34.8 31.84 -      Physical Exam Constitutional:      Appearance: She is well-developed. She is obese.  Cardiovascular:     Rate and Rhythm: Normal rate.     Heart sounds: Normal heart sounds. No  murmur.  Pulmonary:     Effort: Pulmonary effort is normal.     Breath sounds: Normal breath sounds. No wheezing or rales.  Chest:     Chest wall: No tenderness.  Abdominal:     General: Bowel sounds are normal. There is no distension.     Palpations: Abdomen is soft. There is no mass.     Tenderness: There is no abdominal tenderness.  Musculoskeletal: Normal range of motion.  Neurological:     Mental Status: She is alert and oriented to person, place, and time.     CMP Latest Ref Rng & Units 03/23/2018 11/18/2017 09/23/2017  Glucose 65 - 99 mg/dL 85 96 101(H)  BUN 6 - 24 mg/dL 15 17 13   Creatinine 0.57 - 1.00 mg/dL 0.65 0.81 0.83  Sodium 134 - 144 mmol/L 138 140 139  Potassium 3.5 - 5.2 mmol/L 4.6 4.4 4.2  Chloride 96 - 106 mmol/L 100 100 103  CO2 20 - 29 mmol/L 23 26 28   Calcium 8.7 - 10.2 mg/dL 9.8 9.9 9.7  Total Protein 6.0 - 8.5 g/dL 7.0 7.1 7.1  Total Bilirubin 0.0 - 1.2 mg/dL 0.3 0.3 0.8  Alkaline Phos 39 - 117 IU/L 65 78 68  AST 0 - 40 IU/L 13 12 16   ALT 0 - 32 IU/L 14 13 16     Lipid Panel     Component Value Date/Time   CHOL 213 (H) 03/23/2018 1629   TRIG 80 03/23/2018 1629   HDL 56 03/23/2018 1629   CHOLHDL 3.8 03/23/2018 1629   CHOLHDL 3.9 04/08/2016 1025   VLDL 12 04/08/2016 1025   LDLCALC 141 (H) 03/23/2018 1629    CBC    Component Value Date/Time   WBC 6.3 11/18/2017 0849   WBC 4.7 10/08/2015 0926   RBC 4.69 11/18/2017 0849   RBC 4.59 10/08/2015 0926   HGB 14.6 11/18/2017 0849   HCT 43.5 11/18/2017 0849   PLT 276 11/18/2017 0849   MCV 93 11/18/2017 0849   MCH 31.1 11/18/2017 0849   MCH 29.4 10/08/2015 0926   MCHC 33.6 11/18/2017 0849   MCHC 32.5 10/08/2015 0926   RDW 15.0 11/18/2017 0849   LYMPHSABS 2.1 11/18/2017 0849   MONOABS 0.3 03/01/2015 1600   EOSABS 0.1 11/18/2017 0849   BASOSABS 0.0 11/18/2017 0849    Lab Results  Component Value Date   HGBA1C 5.6 (A) 11/23/2018    Assessment & Plan:   1. Essential hypertension Controlled  Counseled on blood pressure goal of less than 130/80, low-sodium, DASH diet, medication compliance, 150 minutes of moderate intensity exercise per week. Discussed medication compliance, adverse effects. - CMP14+EGFR - Lipid panel - benazepril (LOTENSIN) 20 MG tablet; Take 1 tablet (20 mg total) by mouth daily.  Dispense: 30 tablet; Refill: 6  2. Muscle spasm Improved while she was on PT Advised  to work on restoring her [employer] and rescheduling appointments with PT Discussed Yoga and other back exercises - methocarbamol (ROBAXIN) 500 MG tablet; Take 1 tablet (500 mg total) by mouth every 8 (eight) hours as needed for muscle spasms.  Dispense: 60 tablet; Refill: 2 - meloxicam (MOBIC) 7.5 MG tablet; Take 1 tablet (7.5 mg total) by mouth daily.  Dispense: 30 tablet; Refill: 2  3. Chronic midline low back pain with right-sided sciatica See #2 above - DULoxetine (CYMBALTA) 60 MG capsule; Take 1 capsule (60 mg total) by mouth daily. For back pain  Dispense: 30 capsule; Refill: 3  4. Class 1 obesity due to excess calories without serious comorbidity with body mass index (BMI) of 34.0 to 34.9 in adult Counseled on 150 minutes of exercise per week, healthy eating (including decreased daily intake of saturated fats, cholesterol, added sugars, sodium)  5. Diabetes mellitus screening A1c of 5.6 - POCT glycosylated hemoglobin (Hb A1C)   Health Care Maintenance: Up to date  Meds ordered this encounter  Medications  . benazepril (LOTENSIN) 20 MG tablet    Sig: Take 1 tablet (20 mg total) by mouth daily.    Dispense:  30 tablet    Refill:  6  . methocarbamol (ROBAXIN) 500 MG tablet    Sig: Take 1 tablet (500 mg total) by mouth every 8 (eight) hours as needed for muscle spasms.    Dispense:  60 tablet    Refill:  2  . DULoxetine (CYMBALTA) 60 MG capsule    Sig: Take 1 capsule (60 mg total) by mouth daily. For back pain    Dispense:  30 capsule    Refill:  3  . meloxicam (MOBIC) 7.5 MG  tablet    Sig: Take 1 tablet (7.5 mg total) by mouth daily.    Dispense:  30 tablet    Refill:  2    Follow-up: Return in about 6 months (around 05/26/2019) for medical conditions.       Charlott Rakes, MD, FAAFP. [facility], [place]   11/23/2018, 12:21 PM

## 2018-11-23 NOTE — Progress Notes (Signed)
Patient is fasting.  Patient is having pain in shoulders and back.

## 2018-11-23 NOTE — Telephone Encounter (Signed)
Patient called to check on the status of her financial application. Please follow up

## 2018-11-24 LAB — CMP14+EGFR
ALT: 15 IU/L (ref 0–32)
AST: 18 IU/L (ref 0–40)
Albumin/Globulin Ratio: 1.8 (ref 1.2–2.2)
Albumin: 4.7 g/dL (ref 3.8–4.9)
Alkaline Phosphatase: 69 IU/L (ref 39–117)
BUN/Creatinine Ratio: 19 (ref 9–23)
BUN: 16 mg/dL (ref 6–24)
Bilirubin Total: 0.5 mg/dL (ref 0.0–1.2)
CO2: 22 mmol/L (ref 20–29)
Calcium: 9.4 mg/dL (ref 8.7–10.2)
Chloride: 103 mmol/L (ref 96–106)
Creatinine, Ser: 0.85 mg/dL (ref 0.57–1.00)
GFR calc Af Amer: 87 mL/min/{1.73_m2} (ref >59)
GFR calc non Af Amer: 76 mL/min/{1.73_m2} (ref >59)
Globulin, Total: 2.6 g/dL (ref 1.5–4.5)
Glucose: 100 mg/dL — ABNORMAL HIGH (ref 65–99)
Potassium: 4.4 mmol/L (ref 3.5–5.2)
Sodium: 139 mmol/L (ref 134–144)
Total Protein: 7.3 g/dL (ref 6.0–8.5)

## 2018-11-24 LAB — LIPID PANEL
Chol/HDL Ratio: 3.7 ratio (ref 0.0–4.4)
Cholesterol, Total: 216 mg/dL — ABNORMAL HIGH (ref 100–199)
HDL: 59 mg/dL (ref >39)
LDL Calculated: 136 mg/dL — ABNORMAL HIGH (ref 0–99)
Triglycerides: 106 mg/dL (ref 0–149)
VLDL Cholesterol Cal: 21 mg/dL (ref 5–40)

## 2018-12-23 MED FILL — BENAZEPRIL HCL 20 MG TABLET: 20 | 30 days supply | Qty: 30 | Fill #4

## 2019-01-31 MED FILL — BENAZEPRIL HCL 20 MG TABLET: 20 | 30 days supply | Qty: 30 | Fill #5

## 2019-02-25 ENCOUNTER — Encounter: Payer: Self-pay | Admitting: Physical Therapy

## 2019-03-10 ENCOUNTER — Telehealth: Payer: Self-pay | Admitting: Family Medicine

## 2019-03-10 DIAGNOSIS — G8929 Other chronic pain: Secondary | ICD-10-CM

## 2019-03-10 MED FILL — BENAZEPRIL HCL 20 MG TABLET: 20 | 30 days supply | Qty: 30 | Fill #6

## 2019-03-10 NOTE — Telephone Encounter (Signed)
Patient called requesting a new Pt referral Dx Low back pain with intermittent sciatica radiating to right lower extremity.  Right shoulder and neck pain with associated muscle spasm.   Thank you

## 2019-03-11 NOTE — Telephone Encounter (Signed)
Done

## 2019-03-15 ENCOUNTER — Other Ambulatory Visit: Payer: Self-pay

## 2019-03-15 DIAGNOSIS — Z20822 Contact with and (suspected) exposure to covid-19: Secondary | ICD-10-CM

## 2019-03-17 LAB — NOVEL CORONAVIRUS, NAA: SARS-CoV-2, NAA: NOT DETECTED

## 2019-03-21 ENCOUNTER — Telehealth: Payer: Self-pay | Admitting: Family Medicine

## 2019-03-21 NOTE — Telephone Encounter (Signed)
Patient came in asking for the 100% letter

## 2019-03-22 ENCOUNTER — Ambulatory Visit: Payer: Self-pay | Attending: Family Medicine | Admitting: Pharmacist

## 2019-03-22 ENCOUNTER — Other Ambulatory Visit: Payer: Self-pay

## 2019-03-22 DIAGNOSIS — Z23 Encounter for immunization: Secondary | ICD-10-CM

## 2019-03-22 NOTE — Telephone Encounter (Signed)
I try ti called back the Pt, VM is not set up, making a copy of her CAFA letter sending by mail

## 2019-03-22 NOTE — Progress Notes (Signed)
Patient presents for vaccination against influenza per orders of Dr. Newlin. Consent given. Counseling provided. No contraindications exists. Vaccine administered without incident.   

## 2019-03-31 ENCOUNTER — Other Ambulatory Visit: Payer: Self-pay

## 2019-03-31 ENCOUNTER — Ambulatory Visit: Payer: Self-pay | Attending: Family Medicine

## 2019-03-31 DIAGNOSIS — M25511 Pain in right shoulder: Secondary | ICD-10-CM | POA: Insufficient documentation

## 2019-03-31 DIAGNOSIS — M542 Cervicalgia: Secondary | ICD-10-CM

## 2019-03-31 DIAGNOSIS — G8929 Other chronic pain: Secondary | ICD-10-CM

## 2019-03-31 DIAGNOSIS — M5441 Lumbago with sciatica, right side: Secondary | ICD-10-CM | POA: Insufficient documentation

## 2019-03-31 DIAGNOSIS — R293 Abnormal posture: Secondary | ICD-10-CM

## 2019-03-31 DIAGNOSIS — M62830 Muscle spasm of back: Secondary | ICD-10-CM

## 2019-03-31 NOTE — Therapy (Signed)
[place], [place], [address] Phone: [phone]   Fax:  [phone]  Physical Therapy Evaluation  Patient Details  Name: Christina Burke MRN: [id number] Date of Birth: 10-24-1960 Referring Provider (PT): Charlott Rakes, MD   Encounter Date: 03/31/2019  PT End of Session - 03/31/19 1236    Visit Number  1    Number of Visits  12    Date for PT Re-Evaluation  05/13/19    Authorization Type  CAFA    PT Start Time  1215    PT Stop Time  1300    PT Time Calculation (min)  45 min    Activity Tolerance  Patient tolerated treatment well    Behavior During Therapy  [facility] for tasks assessed/performed       Past Medical History:  Diagnosis Date  . Anxiety   . Cancer ([place])    pt. reports had uterine cancer x 10 years ago  . Chest pain 12/08/2013  . GERD (gastroesophageal reflux disease) 08/10/2014  . Hyperlipidemia   . Hypertension   . Murmur 12/08/2013    Past Surgical History:  Procedure Laterality Date  . ABDOMINAL HYSTERECTOMY    . ABDOMINAL SURGERY      There were no vitals filed for this visit.   Subjective Assessment - 03/31/19 1222    Subjective  Chronic pain in shoulder ./neck and back/ She wa here earlier this year and had neck worked on with DN and exercise and   visits limited by corona viris.    Limitations  Sitting;Standing;Walking;House hold activities;Lifting    How long can you sit comfortably?  30 min    How long can you stand comfortably?  30 min    How long can you walk comfortably?  30 min    Diagnostic tests  none    Patient Stated Goals  She wants to have less pain    Currently in Pain?  Yes    Pain Score  6     Pain Location  Back    Pain Orientation  Right;Lower    Pain Descriptors / Indicators  --   uncomfortable   Pain Type  Chronic pain    Pain Radiating Towards  buttock and posterior thigh RT    Pain Onset  More than a month ago    Aggravating Factors   standing,  driving anything    Pain Relieving Factors  meds,  pops back    Multiple Pain Sites  Yes    Pain Score  7    Pain Location  Neck    Pain Orientation  Right    Pain Descriptors / Indicators  Constant;Tightness    Pain Type  Chronic pain    Pain Radiating Towards  RT shoulder    Pain Onset  More than a month ago    Pain Frequency  Constant    Aggravating Factors   nothing specific, over active    Pain Relieving Factors  meds , massage         [facility] PT Assessment - 03/31/19 0001      Assessment   Medical Diagnosis  chronic back and neck pain    Referring Provider (PT)  Charlott Rakes, MD    Onset Date/Surgical Date  --   years   Next MD Visit  As needed    Prior Therapy  3 sessions earlier this year      Precautions   Precautions  None  Restrictions   Weight Bearing Restrictions  No      Balance Screen   Has the patient fallen in the past 6 months  No    Has the patient had a decrease in activity level because of a fear of falling?   No    Is the patient reluctant to leave their home because of a fear of falling?   No      Prior Function   Level of Independence  Independent      Cognition   Overall Cognitive Status  Within Functional Limits for tasks assessed      Posture/Postural Control   Posture Comments  RT shoulder lower than Lt , sway back       ROM / Strength   AROM / PROM / Strength  AROM;PROM;Strength      AROM   AROM Assessment Site  Lumbar;Cervical    Cervical Flexion  50    Cervical Extension  40    Cervical - Right Side Bend  38    Cervical - Left Side Bend  32    Cervical - Right Rotation  64    Cervical - Left Rotation  60    Lumbar Flexion  WNL    Lumbar Extension  15    Lumbar - Right Side Bend  15    Lumbar - Left Side Bend  15      PROM   Overall PROM Comments  Hip ROM WFL but decreased rotation and       Strength   Overall Strength Comments  UE WNL   hip abd / ext  4/5,, abdominals poor to fair      Flexibility   Soft Tissue  Assessment /Muscle Length  yes    Hamstrings  60 degrees      Ambulation/Gait   Gait Comments  WNL                Objective measurements completed on examination: See above findings.              PT Education - 03/31/19 1256    Education Details  POC    Person(s) Educated  Patient    Methods  Explanation    Comprehension  Verbalized understanding       PT Short Term Goals - 03/31/19 1306      PT SHORT TERM GOAL #1   Title  She will be indpendent with initial HEP    Time  2    Period  Weeks    Status  New      PT SHORT TERM GOAL #2   Title  She will report neck pain decreased 25% or more    Time  2    Period  Weeks    Status  New      PT SHORT TERM GOAL #3   Title  Back pain improved 10-20%    Time  2    Period  Weeks    Status  New        PT Long Term Goals - 03/31/19 1307      PT LONG TERM GOAL #1   Title  Independent with HEP    Time  6    Period  Weeks    Status  New      PT LONG TERM GOAL #2   Title  Increase right cervical rotation AROM at least 10 deg to improve ability to turn head while driving    Baseline  60+ degrees    Time  6    Period  Weeks    Status  New      PT LONG TERM GOAL #3   Title  Increase bilat. hip abd and ext strength 1/2 MMT grade to improve ability for lifting for chores    Baseline  4/5 hip aduciton and extensin    Time  6    Period  Weeks    Status  New      PT LONG TERM GOAL #4   Title  Tolerate sitting at least 30 min or greater for eating meals and car travel with neck/shoulder pain 3/10 or less    Baseline  7/10 pain with limited tolerance    Time  6    Period  Weeks    Status  New      PT LONG TERM GOAL #5   Title  Perform chores, cleaning, IADLs at home with LBP 4/10 or less at worse    Baseline  7/10    Time  6    Period  Weeks    Status  New      Additional Long Term Goals   Additional Long Term Goals  Yes      PT LONG TERM GOAL #6   Title  back FOTO decr to 50% limited     Baseline  60% limited    Time  6    Period  Weeks    Status  New             Plan - 03/31/19 1257    Clinical Impression Statement  Ms Sciuto returns with the same neck and back complaints.   She felt PT was helpful in 3 sessions.   She continued with some stiffness in hips and back and neck. She reported pain all on RT side.  She was not particulary tneder except  in RT traps   She should have some improvement with skilled PT and consistent  HEP.    Personal Factors and Comorbidities  Time since onset of injury/illness/exacerbation;Past/Current Experience    Examination-Activity Limitations  Locomotion Level;Sit;Bend;Lift    Examination-Participation Restrictions  Cleaning;Community Activity;Laundry;Meal Prep    Stability/Clinical Decision Making  Stable/Uncomplicated    Rehab Potential  Fair    PT Frequency  2x / week    PT Duration  6 weeks    PT Treatment/Interventions  Electrical Stimulation;Iontophoresis 4mg /ml Dexamethasone;Moist Heat;Therapeutic activities;Therapeutic exercise;Patient/family education;Manual techniques;Passive range of motion;Dry needling    PT Next Visit Plan  REveiew previous HEP and progress. Manual and modalities , core strength    Consulted and Agree with Plan of Care  Patient       Patient will benefit from skilled therapeutic intervention in order to improve the following deficits and impairments:  Obesity, Pain, Postural dysfunction, Decreased strength, Decreased activity tolerance, Increased muscle spasms, Difficulty walking  Visit Diagnosis: Chronic right-sided low back pain with right-sided sciatica  Cervicalgia  Chronic right shoulder pain  Muscle spasm of back  Abnormal posture     Problem List Patient Active Problem List   Diagnosis Date Noted  . Vitamin D deficiency 03/23/2018  . Hypertension   . Anxiety   . Hyperlipidemia   . GERD (gastroesophageal reflux disease) 08/10/2014  . HTN (hypertension) 01/25/2014  . Chest pain  12/08/2013  . Murmur 12/08/2013    Darrel Hoover  PT 03/31/2019, 1:23 PM  [facility] [address] [place], [place], [address] Phone:  [phone]   Fax:  [phone]  Name: Christina Burke MRN: [id number] Date of Birth: June 23, 1960

## 2019-04-07 ENCOUNTER — Ambulatory Visit: Payer: Self-pay | Admitting: Physical Therapy

## 2019-04-07 ENCOUNTER — Encounter: Payer: Self-pay | Admitting: Physical Therapy

## 2019-04-07 ENCOUNTER — Other Ambulatory Visit: Payer: Self-pay

## 2019-04-07 DIAGNOSIS — G8929 Other chronic pain: Secondary | ICD-10-CM

## 2019-04-07 DIAGNOSIS — R293 Abnormal posture: Secondary | ICD-10-CM

## 2019-04-07 DIAGNOSIS — M542 Cervicalgia: Secondary | ICD-10-CM

## 2019-04-07 DIAGNOSIS — M62830 Muscle spasm of back: Secondary | ICD-10-CM

## 2019-04-07 NOTE — Patient Instructions (Addendum)

## 2019-04-07 NOTE — Therapy (Signed)
[place], [place], [address] Phone: [phone]   Fax:  [phone]  Physical Therapy Treatment  Patient Details  Name: Christina Burke MRN: [id number] Date of Birth: 1960-06-07 Referring Provider (PT): Charlott Rakes, MD   Encounter Date: 04/07/2019  PT End of Session - 04/07/19 0837    Visit Number  2    Number of Visits  12    Date for PT Re-Evaluation  05/13/19    Authorization Type  CAFA    PT Start Time  [id number]   pt. arrived late   PT Stop Time  0845    PT Time Calculation (min)  38 min    Activity Tolerance  Patient tolerated treatment well    Behavior During Therapy  [facility] for tasks assessed/performed       Past Medical History:  Diagnosis Date  . Anxiety   . Cancer ([place])    pt. reports had uterine cancer x 10 years ago  . Chest pain 12/08/2013  . GERD (gastroesophageal reflux disease) 08/10/2014  . Hyperlipidemia   . Hypertension   . Murmur 12/08/2013    Past Surgical History:  Procedure Laterality Date  . ABDOMINAL HYSTERECTOMY    . ABDOMINAL SURGERY      There were no vitals filed for this visit.  Subjective Assessment - 04/07/19 0808    Subjective  Primary complaint today is right upper trapezius region pain 7/10. Low back about the same.    Currently in Pain?  Yes    Pain Score  7     Pain Location  Neck    Pain Orientation  Right    Pain Descriptors / Indicators  Tightness    Pain Type  Chronic pain    Pain Radiating Towards  right upper trapezius    Pain Onset  More than a month ago    Pain Frequency  Constant    Aggravating Factors   prolonged cervical flexion, looking at phone    Pain Relieving Factors  massage                       [facility] Adult PT Treatment/Exercise - 04/07/19 0001      Exercises   Exercises  Neck      Neck Exercises: Seated   Neck Retraction  15 reps      Manual Therapy   Manual Therapy  Joint mobilization;Soft tissue  mobilization    Joint Mobilization  thoracic PAs grade I-III in prone    Soft tissue mobilization  seated STM right upper trapezius region      Neck Exercises: Stretches   Upper Trapezius Stretch  Right;3 reps;30 seconds    Levator Stretch  Right;3 reps;30 seconds    Corner Stretch  3 reps;20 seconds       Trigger Point Dry Needling - 04/07/19 0001    Consent Given?  Yes    Education Handout Provided  Yes    Muscles Treated Head and Neck  Upper trapezius;Levator scapulae   right side only   Dry Needling Comments  muscle needles briefly in left sidelying then in prone with estim added, 32 gauge 30 mm needles used    Electrical Stimulation Performed with Dry Needling  Yes    E-stim with Dry Needling Details  TENS 20 pps x 10 min           PT Education - 04/07/19 0837    Education Details  dry needling, HEP  review, POC    Person(s) Educated  Patient    Methods  Explanation;Demonstration;Verbal cues;Handout    Comprehension  Verbalized understanding;Returned demonstration       PT Short Term Goals - 03/31/19 1306      PT SHORT TERM GOAL #1   Title  She will be indpendent with initial HEP    Time  2    Period  Weeks    Status  New      PT SHORT TERM GOAL #2   Title  She will report neck pain decreased 25% or more    Time  2    Period  Weeks    Status  New      PT SHORT TERM GOAL #3   Title  Back pain improved 10-20%    Time  2    Period  Weeks    Status  New        PT Long Term Goals - 03/31/19 1307      PT LONG TERM GOAL #1   Title  Independent with HEP    Time  6    Period  Weeks    Status  New      PT LONG TERM GOAL #2   Title  Increase right cervical rotation AROM at least 10 deg to improve ability to turn head while driving    Baseline  60+ degrees    Time  6    Period  Weeks    Status  New      PT LONG TERM GOAL #3   Title  Increase bilat. hip abd and ext strength 1/2 MMT grade to improve ability for lifting for chores    Baseline  4/5 hip  aduciton and extensin    Time  6    Period  Weeks    Status  New      PT LONG TERM GOAL #4   Title  Tolerate sitting at least 30 min or greater for eating meals and car travel with neck/shoulder pain 3/10 or less    Baseline  7/10 pain with limited tolerance    Time  6    Period  Weeks    Status  New      PT LONG TERM GOAL #5   Title  Perform chores, cleaning, IADLs at home with LBP 4/10 or less at worse    Baseline  7/10    Time  6    Period  Weeks    Status  New      Additional Long Term Goals   Additional Long Term Goals  Yes      PT LONG TERM GOAL #6   Title  back FOTO decr to 50% limited    Baseline  60% limited    Time  6    Period  Weeks    Status  New            Plan - 04/07/19 [id number]    Clinical Impression Statement  Tx. focus upper trapezius region pain with manual therapy, stretches/postural exercises and dry needling. Some soreness with dry needling and also thoracic PAs but overall session well-tolerated. Expect may take 1-2 days to note results and also expect gradual progress given symptom chronicity.    Personal Factors and Comorbidities  Time since onset of injury/illness/exacerbation;Past/Current Experience    Examination-Activity Limitations  Locomotion Level;Sit;Bend;Lift    Examination-Participation Restrictions  Cleaning;Community Activity;Laundry;Meal Prep    Stability/Clinical Decision Making  Stable/Uncomplicated  Clinical Decision Making  Low    Rehab Potential  Fair    PT Frequency  2x / week    PT Duration  6 weeks    PT Treatment/Interventions  Electrical Stimulation;Iontophoresis 4mg /ml Dexamethasone;Moist Heat;Therapeutic activities;Therapeutic exercise;Patient/family education;Manual techniques;Passive range of motion;Dry needling    PT Next Visit Plan  focus lumbar region tomorrow    PT Home Exercise Plan  chin tucks, upper trap and levator stretches, corner pec stretch, scapular retractions    Consulted and Agree with Plan of Care   Patient       Patient will benefit from skilled therapeutic intervention in order to improve the following deficits and impairments:  Obesity, Pain, Postural dysfunction, Decreased strength, Decreased activity tolerance, Increased muscle spasms, Difficulty walking  Visit Diagnosis: Chronic right-sided low back pain with right-sided sciatica  Cervicalgia  Chronic right shoulder pain  Muscle spasm of back  Abnormal posture     Problem List Patient Active Problem List   Diagnosis Date Noted  . Vitamin D deficiency 03/23/2018  . Hypertension   . Anxiety   . Hyperlipidemia   . GERD (gastroesophageal reflux disease) 08/10/2014  . HTN (hypertension) 01/25/2014  . Chest pain 12/08/2013  . Murmur 12/08/2013    Beaulah Dinning, PT, DPT 04/07/19 8:53 AM  [facility] [address] [place], [place], [address] Phone: [phone]   Fax:  [phone]  Name: Ajala Faraone MRN: [id number] Date of Birth: 1960-11-23

## 2019-04-07 NOTE — Therapy (Signed)
[place], [place], [address] Phone: [phone]   Fax:  [phone]  Physical Therapy Treatment  Patient Details  Name: Christina Burke MRN: [id number] Date of Birth: [birth year] Referring Provider (PT): Charlott Rakes, MD   Encounter Date: 04/07/2019  PT End of Session - 04/07/19 0837    Visit Number  2    Number of Visits  12    Date for PT Re-Evaluation  05/13/19    Authorization Type  CAFA    PT Start Time  [id number]   pt. arrived late   PT Stop Time  0845    PT Time Calculation (min)  38 min    Activity Tolerance  Patient tolerated treatment well    Behavior During Therapy  [facility] for tasks assessed/performed       Past Medical History:  Diagnosis Date  . Anxiety   . Cancer ([place])    pt. reports had uterine cancer x 10 years ago  . Chest pain 12/08/2013  . GERD (gastroesophageal reflux disease) 08/10/2014  . Hyperlipidemia   . Hypertension   . Murmur 12/08/2013    Past Surgical History:  Procedure Laterality Date  . ABDOMINAL HYSTERECTOMY    . ABDOMINAL SURGERY      There were no vitals filed for this visit.  Subjective Assessment - 04/07/19 0808    Subjective  Primary complaint today is right upper trapezius region pain 7/10. Low back about the same.    Currently in Pain?  Yes    Pain Score  7     Pain Location  Neck    Pain Orientation  Right    Pain Descriptors / Indicators  Tightness    Pain Type  Chronic pain    Pain Radiating Towards  right upper trapezius    Pain Onset  More than a month ago    Pain Frequency  Constant    Aggravating Factors   prolonged cervical flexion, looking at phone    Pain Relieving Factors  massage                       [facility] Adult PT Treatment/Exercise - 04/07/19 0001      Exercises   Exercises  Neck      Neck Exercises: Seated   Neck Retraction  15 reps      Manual Therapy   Manual Therapy  Joint mobilization;Soft tissue  mobilization    Joint Mobilization  thoracic PAs grade I-III in prone    Soft tissue mobilization  seated STM right upper trapezius region      Neck Exercises: Stretches   Upper Trapezius Stretch  Right;3 reps;30 seconds    Levator Stretch  Right;3 reps;30 seconds    Corner Stretch  3 reps;20 seconds       Trigger Point Dry Needling - 04/07/19 0001    Consent Given?  Yes    Education Handout Provided  Yes    Muscles Treated Head and Neck  Upper trapezius;Levator scapulae   right side only   Dry Needling Comments  muscle needles briefly in left sidelying then in prone with estim added, 32 gauge 30 mm needles used    Electrical Stimulation Performed with Dry Needling  Yes    E-stim with Dry Needling Details  TENS 20 pps x 10 min           PT Education - 04/07/19 0837    Education Details  dry needling, HEP  review, POC    Person(s) Educated  Patient    Methods  Explanation;Demonstration;Verbal cues;Handout    Comprehension  Verbalized understanding;Returned demonstration       PT Short Term Goals - 03/31/19 1306      PT SHORT TERM GOAL #1   Title  She will be indpendent with initial HEP    Time  2    Period  Weeks    Status  New      PT SHORT TERM GOAL #2   Title  She will report neck pain decreased 25% or more    Time  2    Period  Weeks    Status  New      PT SHORT TERM GOAL #3   Title  Back pain improved 10-20%    Time  2    Period  Weeks    Status  New        PT Long Term Goals - 03/31/19 1307      PT LONG TERM GOAL #1   Title  Independent with HEP    Time  6    Period  Weeks    Status  New      PT LONG TERM GOAL #2   Title  Increase right cervical rotation AROM at least 10 deg to improve ability to turn head while driving    Baseline  60+ degrees    Time  6    Period  Weeks    Status  New      PT LONG TERM GOAL #3   Title  Increase bilat. hip abd and ext strength 1/2 MMT grade to improve ability for lifting for chores    Baseline  4/5 hip  aduciton and extensin    Time  6    Period  Weeks    Status  New      PT LONG TERM GOAL #4   Title  Tolerate sitting at least 30 min or greater for eating meals and car travel with neck/shoulder pain 3/10 or less    Baseline  7/10 pain with limited tolerance    Time  6    Period  Weeks    Status  New      PT LONG TERM GOAL #5   Title  Perform chores, cleaning, IADLs at home with LBP 4/10 or less at worse    Baseline  7/10    Time  6    Period  Weeks    Status  New      Additional Long Term Goals   Additional Long Term Goals  Yes      PT LONG TERM GOAL #6   Title  back FOTO decr to 50% limited    Baseline  60% limited    Time  6    Period  Weeks    Status  New            Plan - 04/07/19 [id number]    Clinical Impression Statement  Tx. focus upper trapezius region pain with manual therapy, stretches/postural exercises and dry needling. Some soreness with dry needling and also thoracic PAs but overall session well-tolerated. Expect may take 1-2 days to note results and also expect gradual progress given symptom chronicity.    Personal Factors and Comorbidities  Time since onset of injury/illness/exacerbation;Past/Current Experience    Examination-Activity Limitations  Locomotion Level;Sit;Bend;Lift    Examination-Participation Restrictions  Cleaning;Community Activity;Laundry;Meal Prep    Stability/Clinical Decision Making  Stable/Uncomplicated  Clinical Decision Making  Low    Rehab Potential  Fair    PT Frequency  2x / week    PT Duration  6 weeks    PT Treatment/Interventions  Electrical Stimulation;Iontophoresis 4mg /ml Dexamethasone;Moist Heat;Therapeutic activities;Therapeutic exercise;Patient/family education;Manual techniques;Passive range of motion;Dry needling    PT Next Visit Plan  focus lumbar region tomorrow    Consulted and Agree with Plan of Care  Patient       Patient will benefit from skilled therapeutic intervention in order to improve the following  deficits and impairments:  Obesity, Pain, Postural dysfunction, Decreased strength, Decreased activity tolerance, Increased muscle spasms, Difficulty walking  Visit Diagnosis: Chronic right-sided low back pain with right-sided sciatica  Cervicalgia  Chronic right shoulder pain  Muscle spasm of back  Abnormal posture     Problem List Patient Active Problem List   Diagnosis Date Noted  . Vitamin D deficiency 03/23/2018  . Hypertension   . Anxiety   . Hyperlipidemia   . GERD (gastroesophageal reflux disease) 08/10/2014  . HTN (hypertension) 01/25/2014  . Chest pain 12/08/2013  . Murmur 12/08/2013    Beaulah Dinning, PT, DPT 04/07/19 8:41 AM  [facility] [address] [place], [place], [address] Phone: [phone]   Fax:  [phone]  Name: Christina Burke MRN: [id number] Date of Birth: Nov 06, 1960

## 2019-04-08 ENCOUNTER — Encounter: Payer: Self-pay | Admitting: Physical Therapy

## 2019-04-08 ENCOUNTER — Ambulatory Visit: Payer: Self-pay | Admitting: Physical Therapy

## 2019-04-08 DIAGNOSIS — M5441 Lumbago with sciatica, right side: Secondary | ICD-10-CM

## 2019-04-08 DIAGNOSIS — M542 Cervicalgia: Secondary | ICD-10-CM

## 2019-04-08 DIAGNOSIS — M25511 Pain in right shoulder: Secondary | ICD-10-CM

## 2019-04-08 DIAGNOSIS — R293 Abnormal posture: Secondary | ICD-10-CM

## 2019-04-08 DIAGNOSIS — G8929 Other chronic pain: Secondary | ICD-10-CM

## 2019-04-08 DIAGNOSIS — M62830 Muscle spasm of back: Secondary | ICD-10-CM

## 2019-04-08 NOTE — Therapy (Signed)
[place], [place], [address] Phone: [phone]   Fax:  [phone]  Physical Therapy Treatment  Patient Details  Name: Christina Burke MRN: [id number] Date of Birth: [birth year] Referring Provider (PT): Charlott Rakes, MD   Encounter Date: 04/08/2019  PT End of Session - 04/08/19 0804    Visit Number  3    Number of Visits  12    Date for PT Re-Evaluation  05/13/19    Authorization Type  CAFA    PT Start Time  0800    PT Stop Time  0842    PT Time Calculation (min)  42 min    Activity Tolerance  Patient tolerated treatment well    Behavior During Therapy  [facility] for tasks assessed/performed       Past Medical History:  Diagnosis Date  . Anxiety   . Cancer ([place])    pt. reports had uterine cancer x 10 years ago  . Chest pain 12/08/2013  . GERD (gastroesophageal reflux disease) 08/10/2014  . Hyperlipidemia   . Hypertension   . Murmur 12/08/2013    Past Surgical History:  Procedure Laterality Date  . ABDOMINAL HYSTERECTOMY    . ABDOMINAL SURGERY      There were no vitals filed for this visit.  Subjective Assessment - 04/08/19 0802    Subjective  Soreness after dry needling yesterday/symptoms on and off so difficult to tell if improvement. Tx. focus for lumbar pain today with pt. c/o local right lumbar pain.    Currently in Pain?  Yes    Pain Score  7     Pain Location  Back    Pain Orientation  Right;Lower    Pain Descriptors / Indicators  Aching    Pain Type  Chronic pain    Pain Onset  More than a month ago    Pain Frequency  Constant    Aggravating Factors   "everything"         [facility] PT Assessment - 04/08/19 0001      AROM   Cervical - Right Rotation  60    Cervical - Left Rotation  75                   [facility] Adult PT Treatment/Exercise - 04/08/19 0001      Exercises   Exercises  Lumbar      Lumbar Exercises: Stretches   Single Knee to Chest Stretch   Right;Left;3 reps    Single Knee to Chest Stretch Limitations  10-20 sec holds    Lower Trunk Rotation Limitations  x 10 reps left emphasis for right lumbar stretch      Lumbar Exercises: Aerobic   Nustep  L5 x 5 min UE/LE      Lumbar Exercises: Supine   Pelvic Tilt  10 reps    Bent Knee Raise  10 reps    Bridge  10 reps    Other Supine Lumbar Exercises  hip adduction isometric from hooklying 3-5 sec holds      Manual Therapy   Soft tissue mobilization  right lumbar region       Trigger Point Dry Needling - 04/08/19 0001    Consent Given?  Yes    Muscles Treated Back/Hip  Erector spinae;Lumbar multifidi    Dry Needling Comments  right lumbar longissimus and multifidi L3-S1 needled in prone with 30 gauge 50 mm needles    Electrical Stimulation Performed with Dry Needling  Yes  E-stim with Dry Needling Details  TENS 2 pss x 10 minutes           PT Education - 04/08/19 0833    Education Details  exercises, POC    Person(s) Educated  Patient    Methods  Explanation;Demonstration;Tactile cues;Verbal cues    Comprehension  Verbalized understanding;Returned demonstration       PT Short Term Goals - 03/31/19 1306      PT SHORT TERM GOAL #1   Title  She will be indpendent with initial HEP    Time  2    Period  Weeks    Status  New      PT SHORT TERM GOAL #2   Title  She will report neck pain decreased 25% or more    Time  2    Period  Weeks    Status  New      PT SHORT TERM GOAL #3   Title  Back pain improved 10-20%    Time  2    Period  Weeks    Status  New        PT Long Term Goals - 03/31/19 1307      PT LONG TERM GOAL #1   Title  Independent with HEP    Time  6    Period  Weeks    Status  New      PT LONG TERM GOAL #2   Title  Increase right cervical rotation AROM at least 10 deg to improve ability to turn head while driving    Baseline  60+ degrees    Time  6    Period  Weeks    Status  New      PT LONG TERM GOAL #3   Title  Increase bilat.  hip abd and ext strength 1/2 MMT grade to improve ability for lifting for chores    Baseline  4/5 hip aduciton and extensin    Time  6    Period  Weeks    Status  New      PT LONG TERM GOAL #4   Title  Tolerate sitting at least 30 min or greater for eating meals and car travel with neck/shoulder pain 3/10 or less    Baseline  7/10 pain with limited tolerance    Time  6    Period  Weeks    Status  New      PT LONG TERM GOAL #5   Title  Perform chores, cleaning, IADLs at home with LBP 4/10 or less at worse    Baseline  7/10    Time  6    Period  Weeks    Status  New      Additional Long Term Goals   Additional Long Term Goals  Yes      PT LONG TERM GOAL #6   Title  back FOTO decr to 50% limited    Baseline  60% limited    Time  6    Period  Weeks    Status  New            Plan - 04/08/19 [id number]    Clinical Impression Statement  Improved cervical rotation ROM noted from baseline to left side. Tx. focus for lumbar region today with exercises as noted per flowsheet and STM + dry needling. Suspect myofascial component to symptoms most notable in right lower lumbar paraspinals so focus this region with manual therapy and dry needling. As with  neck expect gradual progress givem symptom chronicity so monitoring of progress will be ongoing.    Personal Factors and Comorbidities  Time since onset of injury/illness/exacerbation;Past/Current Experience    Examination-Activity Limitations  Locomotion Level;Sit;Bend;Lift    Examination-Participation Restrictions  Cleaning;Community Activity;Laundry;Meal Prep    Stability/Clinical Decision Making  Stable/Uncomplicated    Clinical Decision Making  Low    Rehab Potential  Fair    PT Frequency  2x / week    PT Duration  6 weeks    PT Treatment/Interventions  Electrical Stimulation;Iontophoresis 4mg /ml Dexamethasone;Moist Heat;Therapeutic activities;Therapeutic exercise;Patient/family education;Manual techniques;Passive range of motion;Dry  needling    PT Next Visit Plan  focus right upper trapezius region next session with dry needling, manual and stretches, postural strengthening    PT Home Exercise Plan  chin tucks, upper trap and levator stretches, corner pec stretch, scapular retractions, pelvic tilt, SKTC, LTR, supine marches and hip bridge    Consulted and Agree with Plan of Care  Patient       Patient will benefit from skilled therapeutic intervention in order to improve the following deficits and impairments:  Obesity, Pain, Postural dysfunction, Decreased strength, Decreased activity tolerance, Increased muscle spasms, Difficulty walking  Visit Diagnosis: Chronic right-sided low back pain with right-sided sciatica  Cervicalgia  Chronic right shoulder pain  Muscle spasm of back  Abnormal posture     Problem List Patient Active Problem List   Diagnosis Date Noted  . Vitamin D deficiency 03/23/2018  . Hypertension   . Anxiety   . Hyperlipidemia   . GERD (gastroesophageal reflux disease) 08/10/2014  . HTN (hypertension) 01/25/2014  . Chest pain 12/08/2013  . Murmur 12/08/2013    Beaulah Dinning, PT, DPT 04/08/19 8:44 AM  [facility] [address] [place], [place], [address] Phone: [phone]   Fax:  [phone]  Name: Jonne Kesecker MRN: [id number] Date of Birth: [birth year]

## 2019-04-12 ENCOUNTER — Other Ambulatory Visit: Payer: Self-pay

## 2019-04-12 ENCOUNTER — Ambulatory Visit: Payer: Self-pay

## 2019-04-12 DIAGNOSIS — M5441 Lumbago with sciatica, right side: Secondary | ICD-10-CM

## 2019-04-12 DIAGNOSIS — G8929 Other chronic pain: Secondary | ICD-10-CM

## 2019-04-12 DIAGNOSIS — M542 Cervicalgia: Secondary | ICD-10-CM

## 2019-04-12 NOTE — Therapy (Signed)
[place], [place], [address] Phone: [phone]   Fax:  [phone]  Physical Therapy Treatment  Patient Details  Name: Christina Burke MRN: [id number] Date of Birth: [birth year] Referring Provider (PT): Charlott Rakes, MD   Encounter Date: 04/12/2019  PT End of Session - 04/12/19 0912    Visit Number  4    Number of Visits  12    Authorization Type  CAFA    PT Start Time  0915    PT Stop Time  1000    PT Time Calculation (min)  45 min    Activity Tolerance  Patient tolerated treatment well    Behavior During Therapy  [facility] for tasks assessed/performed       Past Medical History:  Diagnosis Date  . Anxiety   . Cancer ([place])    pt. reports had uterine cancer x 10 years ago  . Chest pain 12/08/2013  . GERD (gastroesophageal reflux disease) 08/10/2014  . Hyperlipidemia   . Hypertension   . Murmur 12/08/2013    Past Surgical History:  Procedure Laterality Date  . ABDOMINAL HYSTERECTOMY    . ABDOMINAL SURGERY      There were no vitals filed for this visit.  Subjective Assessment - 04/12/19 0915    Subjective  About the same.   Sore for 2 days post DN and then better for a day (6/10)   .    Pain Score  7     Pain Location  Back    Pain Orientation  Right;Lower    Pain Descriptors / Indicators  Aching    Pain Type  Chronic pain    Pain Radiating Towards  RT upper traps /neck    Pain Onset  More than a month ago    Pain Frequency  Constant    Aggravating Factors   everything    Pain Relieving Factors  massage    Pain Score  6    Pain Location  Neck    Pain Orientation  Right    Pain Descriptors / Indicators  Tightness    Pain Type  Chronic pain    Pain Onset  More than a month ago    Pain Frequency  Constant    Aggravating Factors   activity , nothing specific    Pain Relieving Factors  mes\ds , massage                                 PT Short Term Goals -  03/31/19 1306      PT SHORT TERM GOAL #1   Title  She will be indpendent with initial HEP    Time  2    Period  Weeks    Status  New      PT SHORT TERM GOAL #2   Title  She will report neck pain decreased 25% or more    Time  2    Period  Weeks    Status  New      PT SHORT TERM GOAL #3   Title  Back pain improved 10-20%    Time  2    Period  Weeks    Status  New        PT Long Term Goals - 03/31/19 1307      PT LONG TERM GOAL #1   Title  Independent with HEP    Time  6  Period  Weeks    Status  New      PT LONG TERM GOAL #2   Title  Increase right cervical rotation AROM at least 10 deg to improve ability to turn head while driving    Baseline  60+ degrees    Time  6    Period  Weeks    Status  New      PT LONG TERM GOAL #3   Title  Increase bilat. hip abd and ext strength 1/2 MMT grade to improve ability for lifting for chores    Baseline  4/5 hip aduciton and extensin    Time  6    Period  Weeks    Status  New      PT LONG TERM GOAL #4   Title  Tolerate sitting at least 30 min or greater for eating meals and car travel with neck/shoulder pain 3/10 or less    Baseline  7/10 pain with limited tolerance    Time  6    Period  Weeks    Status  New      PT LONG TERM GOAL #5   Title  Perform chores, cleaning, IADLs at home with LBP 4/10 or less at worse    Baseline  7/10    Time  6    Period  Weeks    Status  New      Additional Long Term Goals   Additional Long Term Goals  Yes      PT LONG TERM GOAL #6   Title  back FOTO decr to 50% limited    Baseline  60% limited    Time  6    Period  Weeks    Status  New            Plan - 04/12/19 0912    PT Treatment/Interventions  Electrical Stimulation;Iontophoresis 4mg /ml Dexamethasone;Moist Heat;Therapeutic activities;Therapeutic exercise;Patient/family education;Manual techniques;Passive range of motion;Dry needling    PT Next Visit Plan  focus right upper trapezius region next session with dry  needling, manual and stretches, postural strengthening    PT Home Exercise Plan  chin tucks, upper trap and levator stretches, corner pec stretch, scapular retractions, pelvic tilt, SKTC, LTR, supine marches and hip bridge    Consulted and Agree with Plan of Care  Patient       Patient will benefit from skilled therapeutic intervention in order to improve the following deficits and impairments:  Obesity, Pain, Postural dysfunction, Decreased strength, Decreased activity tolerance, Increased muscle spasms, Difficulty walking  Visit Diagnosis: Chronic right-sided low back pain with right-sided sciatica  Cervicalgia  Chronic right shoulder pain     Problem List Patient Active Problem List   Diagnosis Date Noted  . Vitamin D deficiency 03/23/2018  . Hypertension   . Anxiety   . Hyperlipidemia   . GERD (gastroesophageal reflux disease) 08/10/2014  . HTN (hypertension) 01/25/2014  . Chest pain 12/08/2013  . Murmur 12/08/2013    Darrel Hoover  PT 04/12/2019, 9:19 AM  [facility] [address] [place], [place], [address] Phone: [phone]   Fax:  [phone]  Name: Kerrianne Canete MRN: [id number] Date of Birth: 03-24-1961

## 2019-04-14 ENCOUNTER — Other Ambulatory Visit: Payer: Self-pay

## 2019-04-14 ENCOUNTER — Ambulatory Visit: Payer: Self-pay | Admitting: Physical Therapy

## 2019-04-14 ENCOUNTER — Encounter: Payer: Self-pay | Admitting: Physical Therapy

## 2019-04-14 DIAGNOSIS — R293 Abnormal posture: Secondary | ICD-10-CM

## 2019-04-14 DIAGNOSIS — G8929 Other chronic pain: Secondary | ICD-10-CM

## 2019-04-14 DIAGNOSIS — M542 Cervicalgia: Secondary | ICD-10-CM

## 2019-04-14 DIAGNOSIS — M62830 Muscle spasm of back: Secondary | ICD-10-CM

## 2019-04-14 NOTE — Therapy (Signed)
[place], [place], [address] Phone: [phone]   Fax:  [phone]  Physical Therapy Treatment  Patient Details  Name: Christina Burke MRN: [id number] Date of Birth: 07-Mar-1961 Referring Provider (PT): Charlott Rakes, MD   Encounter Date: 04/14/2019  PT End of Session - 04/14/19 1224    Visit Number  5    Number of Visits  12    Date for PT Re-Evaluation  05/13/19    Authorization Type  CAFA    PT Start Time  1147    PT Stop Time  1230    PT Time Calculation (min)  43 min    Activity Tolerance  Patient limited by pain    Behavior During Therapy  [facility] for tasks assessed/performed       Past Medical History:  Diagnosis Date  . Anxiety   . Cancer ([place])    pt. reports had uterine cancer x 10 years ago  . Chest pain 12/08/2013  . GERD (gastroesophageal reflux disease) 08/10/2014  . Hyperlipidemia   . Hypertension   . Murmur 12/08/2013    Past Surgical History:  Procedure Laterality Date  . ABDOMINAL HYSTERECTOMY    . ABDOMINAL SURGERY      There were no vitals filed for this visit.  Subjective Assessment - 04/14/19 1148    Subjective  Pt. reports primary complaint of right lumbar pain 7/10 this AM. Some soreness after last session but otherwise no specific exacerbating cause noted.    Currently in Pain?  Yes    Pain Score  7     Pain Location  Back    Pain Orientation  Right;Lower    Pain Descriptors / Indicators  Aching    Pain Type  Chronic pain    Pain Onset  More than a month ago    Pain Frequency  Constant                       [facility] Adult PT Treatment/Exercise - 04/14/19 0001      Lumbar Exercises: Stretches   Single Knee to Chest Stretch  Right;Left;3 reps;20 seconds    Lower Trunk Rotation Limitations  x 10 reps left emphasis for right lumbar stretch      Lumbar Exercises: Supine   Pelvic Tilt  15 reps    Bent Knee Raise  15 reps    Bridge  15 reps    Other  Supine Lumbar Exercises  hip adduction isometric from hooklying 3-5 sec holds 12 reps    Other Supine Lumbar Exercises  clamshell x 15 reps with green band      Manual Therapy   Joint Mobilization  right hip LAD grade I-III oscillations    Soft tissue mobilization  Right lumbar region      Neck Exercises: Stretches   Upper Trapezius Stretch  Right;3 reps;30 seconds   supine manual stretch      Trigger Point Dry Needling - 04/14/19 0001    Consent Given?  Yes    Muscles Treated Head and Neck  Upper trapezius    Muscles Treated Back/Hip  Erector spinae;Lumbar multifidi    Dry Needling Comments  Right lumbar longissimus and multifidi L3-5 region with 30 gauge 50 mm needles, right upper trapezius with 32 gauge 30 mm needles, all needling performed in prone    Electrical Stimulation Performed with Dry Needling  Yes    E-stim with Dry Needling Details  TENS 20 pps x  10 min to right upper trapezius and right lumbar region           PT Education - 04/14/19 1223    Education Details  pain education    Person(s) Educated  Patient    Methods  Explanation    Comprehension  Verbalized understanding       PT Short Term Goals - 04/12/19 0941      PT SHORT TERM GOAL #1   Title  She will be indpendent with initial HEP    Status  Achieved      PT SHORT TERM GOAL #2   Title  She will report neck pain decreased 25% or more    Status  On-going      PT SHORT TERM GOAL #3   Title  Back pain improved 10-20%    Status  On-going        PT Long Term Goals - 03/31/19 1307      PT LONG TERM GOAL #1   Title  Independent with HEP    Time  6    Period  Weeks    Status  New      PT LONG TERM GOAL #2   Title  Increase right cervical rotation AROM at least 10 deg to improve ability to turn head while driving    Baseline  60+ degrees    Time  6    Period  Weeks    Status  New      PT LONG TERM GOAL #3   Title  Increase bilat. hip abd and ext strength 1/2 MMT grade to improve ability  for lifting for chores    Baseline  4/5 hip aduciton and extensin    Time  6    Period  Weeks    Status  New      PT LONG TERM GOAL #4   Title  Tolerate sitting at least 30 min or greater for eating meals and car travel with neck/shoulder pain 3/10 or less    Baseline  7/10 pain with limited tolerance    Time  6    Period  Weeks    Status  New      PT LONG TERM GOAL #5   Title  Perform chores, cleaning, IADLs at home with LBP 4/10 or less at worse    Baseline  7/10    Time  6    Period  Weeks    Status  New      Additional Long Term Goals   Additional Long Term Goals  Yes      PT LONG TERM GOAL #6   Title  back FOTO decr to 50% limited    Baseline  60% limited    Time  6    Period  Weeks    Status  New            Plan - 04/14/19 1224    Clinical Impression Statement  Fair progress with therapy with mild temporary relief but limited lasting improvement. Given prolonged history chronic pain expect potential progress will take more time so plan continue therapy as tolerated but ultimately if not noting any lasting improvement with therapy in the next several weeks would plan d/c.    Personal Factors and Comorbidities  Time since onset of injury/illness/exacerbation;Past/Current Experience    Examination-Activity Limitations  Locomotion Level;Sit;Bend;Lift    Examination-Participation Restrictions  Cleaning;Community Activity;Laundry;Meal Prep    Stability/Clinical Decision Making  Stable/Uncomplicated    Clinical Decision  Making  Low    Rehab Potential  Fair    PT Frequency  2x / week    PT Duration  6 weeks    PT Treatment/Interventions  Electrical Stimulation;Iontophoresis 4mg /ml Dexamethasone;Moist Heat;Therapeutic activities;Therapeutic exercise;Patient/family education;Manual techniques;Passive range of motion;Dry needling    PT Next Visit Plan  continue tx. focus lumbar vs. upper trap region pending symptoms    PT Home Exercise Plan  chin tucks, upper trap and  levator stretches, corner pec stretch, scapular retractions, pelvic tilt, SKTC, LTR, supine marches and hip bridge    Consulted and Agree with Plan of Care  Patient       Patient will benefit from skilled therapeutic intervention in order to improve the following deficits and impairments:  Obesity, Pain, Postural dysfunction, Decreased strength, Decreased activity tolerance, Increased muscle spasms, Difficulty walking  Visit Diagnosis: Chronic right-sided low back pain with right-sided sciatica  Cervicalgia  Chronic right shoulder pain  Muscle spasm of back  Abnormal posture     Problem List Patient Active Problem List   Diagnosis Date Noted  . Vitamin D deficiency 03/23/2018  . Hypertension   . Anxiety   . Hyperlipidemia   . GERD (gastroesophageal reflux disease) 08/10/2014  . HTN (hypertension) 01/25/2014  . Chest pain 12/08/2013  . Murmur 12/08/2013   Beaulah Dinning, PT, DPT 04/14/19 1:02 PM  [facility] [address] [place], [place], [address] Phone: [phone]   Fax:  [phone]  Name: Christina Burke MRN: [id number] Date of Birth: [birth year]

## 2019-04-18 ENCOUNTER — Other Ambulatory Visit: Payer: Self-pay

## 2019-04-18 ENCOUNTER — Ambulatory Visit: Payer: Self-pay

## 2019-04-18 DIAGNOSIS — G8929 Other chronic pain: Secondary | ICD-10-CM

## 2019-04-18 DIAGNOSIS — M62830 Muscle spasm of back: Secondary | ICD-10-CM

## 2019-04-18 DIAGNOSIS — M542 Cervicalgia: Secondary | ICD-10-CM

## 2019-04-18 NOTE — Therapy (Signed)
[place], [place], [address] Phone: [phone]   Fax:  [phone]  Physical Therapy Treatment  Patient Details  Name: Christina Burke MRN: [id number] Date of Birth: [birth year] Referring Provider (PT): Charlott Rakes, MD   Encounter Date: 04/18/2019  PT End of Session - 04/18/19 1615    Visit Number  6    Number of Visits  12    Date for PT Re-Evaluation  05/13/19    Authorization Type  CAFA    PT Start Time  0415    PT Stop Time  0515    PT Time Calculation (min)  60 min    Activity Tolerance  Patient limited by pain    Behavior During Therapy  [facility] for tasks assessed/performed       Past Medical History:  Diagnosis Date  . Anxiety   . Cancer ([place])    pt. reports had uterine cancer x 10 years ago  . Chest pain 12/08/2013  . GERD (gastroesophageal reflux disease) 08/10/2014  . Hyperlipidemia   . Hypertension   . Murmur 12/08/2013    Past Surgical History:  Procedure Laterality Date  . ABDOMINAL HYSTERECTOMY    . ABDOMINAL SURGERY      There were no vitals filed for this visit.  Subjective Assessment - 04/18/19 1620    Subjective  No better . Did shoulder and back  last time and did not get bette though usually it feel better after a day or 2.    Pain Score  7     Pain Location  Back    Pain Orientation  Right;Lower    Pain Descriptors / Indicators  Aching    Pain Type  Chronic pain    Pain Onset  More than a month ago    Pain Frequency  Constant    Pain Score  7    Pain Location  Neck    Pain Orientation  Right    Pain Descriptors / Indicators  Tightness    Pain Type  Chronic pain    Pain Radiating Towards  RT shoulder    Pain Onset  More than a month ago    Pain Frequency  Constant                       [facility] Adult PT Treatment/Exercise - 04/18/19 0001      Lumbar Exercises: Stretches   Single Knee to Chest Stretch  Right;Left;3 reps;20 seconds    Lower Trunk  Rotation Limitations  x 10 reps left emphasis for right lumbar stretch    Other Lumbar Stretch Exercise  seated rotation and sidebending RT with contract relax gentle x 4      Lumbar Exercises: Supine   Pelvic Tilt  15 reps    Bent Knee Raise  15 reps    Bridge  15 reps    Other Supine Lumbar Exercises  hip adduction isometric from hooklying 3-5 sec holds 12 reps    Other Supine Lumbar Exercises  clamshell x 15 reps with green band      Moist Heat Therapy   Number Minutes Moist Heat  10 Minutes    Moist Heat Location  --   neck and back.      Manual Therapy   Joint Mobilization  central and RT lumbar spine    Soft tissue mobilization  Right lumbar region and RT neck and traps/  PT Education - 04/18/19 1713    Education Details  reviewed pain educaton and how many factors can cause increased and sustained pain.    Person(s) Educated  Patient    Methods  Explanation    Comprehension  Verbalized understanding       PT Short Term Goals - 04/12/19 0941      PT SHORT TERM GOAL #1   Title  She will be indpendent with initial HEP    Status  Achieved      PT SHORT TERM GOAL #2   Title  She will report neck pain decreased 25% or more    Status  On-going      PT SHORT TERM GOAL #3   Title  Back pain improved 10-20%    Status  On-going        PT Long Term Goals - 03/31/19 1307      PT LONG TERM GOAL #1   Title  Independent with HEP    Time  6    Period  Weeks    Status  New      PT LONG TERM GOAL #2   Title  Increase right cervical rotation AROM at least 10 deg to improve ability to turn head while driving    Baseline  60+ degrees    Time  6    Period  Weeks    Status  New      PT LONG TERM GOAL #3   Title  Increase bilat. hip abd and ext strength 1/2 MMT grade to improve ability for lifting for chores    Baseline  4/5 hip aduciton and extensin    Time  6    Period  Weeks    Status  New      PT LONG TERM GOAL #4   Title  Tolerate sitting at  least 30 min or greater for eating meals and car travel with neck/shoulder pain 3/10 or less    Baseline  7/10 pain with limited tolerance    Time  6    Period  Weeks    Status  New      PT LONG TERM GOAL #5   Title  Perform chores, cleaning, IADLs at home with LBP 4/10 or less at worse    Baseline  7/10    Time  6    Period  Weeks    Status  New      Additional Long Term Goals   Additional Long Term Goals  Yes      PT LONG TERM GOAL #6   Title  back FOTO decr to 50% limited    Baseline  60% limited    Time  6    Period  Weeks    Status  New            Plan - 04/18/19 1615    Clinical Impression Statement  Worse than last session and reported LT trap pain after lumbar exercises but was eased in neck and back with hot pack. She does not appear to be improving.    PT Treatment/Interventions  Electrical Stimulation;Iontophoresis 4mg /ml Dexamethasone;Moist Heat;Therapeutic activities;Therapeutic exercise;Patient/family education;Manual techniques;Passive range of motion;Dry needling    PT Next Visit Plan  continue tx. focus lumbar vs. upper trap region pending symptoms    PT Home Exercise Plan  chin tucks, upper trap and levator stretches, corner pec stretch, scapular retractions, pelvic tilt, SKTC, LTR, supine marches and hip bridge    Consulted and Agree  with Plan of Care  Patient       Patient will benefit from skilled therapeutic intervention in order to improve the following deficits and impairments:  Obesity, Pain, Postural dysfunction, Decreased strength, Decreased activity tolerance, Increased muscle spasms, Difficulty walking  Visit Diagnosis: Chronic right-sided low back pain with right-sided sciatica  Cervicalgia  Chronic right shoulder pain  Muscle spasm of back     Problem List Patient Active Problem List   Diagnosis Date Noted  . Vitamin D deficiency 03/23/2018  . Hypertension   . Anxiety   . Hyperlipidemia   . GERD (gastroesophageal reflux  disease) 08/10/2014  . HTN (hypertension) 01/25/2014  . Chest pain 12/08/2013  . Murmur 12/08/2013    Darrel Hoover PT 04/18/2019, 5:27 PM  [facility] [address] [place], [place], [address] Phone: [phone]   Fax:  [phone]  Name: Alnita Scheurer MRN: [id number] Date of Birth: January 09, 1961

## 2019-04-20 ENCOUNTER — Emergency Department (HOSPITAL_COMMUNITY)
Admission: EM | Admit: 2019-04-20 | Discharge: 2019-04-20 | Disposition: A | Discharge status: Home / Self Care | Payer: Medicaid Other | Attending: Emergency Medicine | Admitting: Emergency Medicine

## 2019-04-20 ENCOUNTER — Encounter (HOSPITAL_COMMUNITY): Payer: Self-pay | Admitting: Emergency Medicine

## 2019-04-20 ENCOUNTER — Ambulatory Visit: Payer: Self-pay | Admitting: Physical Therapy

## 2019-04-20 ENCOUNTER — Other Ambulatory Visit: Payer: Self-pay

## 2019-04-20 DIAGNOSIS — Z79899 Other long term (current) drug therapy: Secondary | ICD-10-CM | POA: Insufficient documentation

## 2019-04-20 DIAGNOSIS — Z8542 Personal history of malignant neoplasm of other parts of uterus: Secondary | ICD-10-CM | POA: Insufficient documentation

## 2019-04-20 DIAGNOSIS — I10 Essential (primary) hypertension: Secondary | ICD-10-CM | POA: Insufficient documentation

## 2019-04-20 DIAGNOSIS — R112 Nausea with vomiting, unspecified: Secondary | ICD-10-CM

## 2019-04-20 LAB — COMPREHENSIVE METABOLIC PANEL
ALT: 24 U/L (ref 0–44)
AST: 23 U/L (ref 15–41)
Albumin: 4.3 g/dL (ref 3.5–5.0)
Alkaline Phosphatase: 68 U/L (ref 38–126)
Anion gap: 12 (ref 5–15)
BUN: 9 mg/dL (ref 6–20)
CO2: 23 mmol/L (ref 22–32)
Calcium: 9.5 mg/dL (ref 8.9–10.3)
Chloride: 103 mmol/L (ref 98–111)
Creatinine, Ser: 0.77 mg/dL (ref 0.44–1.00)
GFR calc Af Amer: >60 mL/min (ref >60)
GFR calc non Af Amer: >60 mL/min (ref >60)
Glucose, Bld: 118 mg/dL — ABNORMAL HIGH (ref 70–99)
Potassium: 4 mmol/L (ref 3.5–5.1)
Sodium: 138 mmol/L (ref 135–145)
Total Bilirubin: 0.7 mg/dL (ref 0.3–1.2)
Total Protein: 7.5 g/dL (ref 6.5–8.1)

## 2019-04-20 LAB — CBC
HCT: 42.2 % (ref 36.0–46.0)
Hemoglobin: 14 g/dL (ref 12.0–15.0)
MCH: 30.4 pg (ref 26.0–34.0)
MCHC: 33.2 g/dL (ref 30.0–36.0)
MCV: 91.5 fL (ref 80.0–100.0)
Platelets: 311 10*3/uL (ref 150–400)
RBC: 4.61 MIL/uL (ref 3.87–5.11)
RDW: 12.5 % (ref 11.5–15.5)
WBC: 11.5 10*3/uL — ABNORMAL HIGH (ref 4.0–10.5)
nRBC: 0 % (ref 0.0–0.2)

## 2019-04-20 LAB — LIPASE, BLOOD: Lipase: 43 U/L (ref 11–51)

## 2019-04-20 LAB — I-STAT BETA HCG BLOOD, ED (MC, WL, AP ONLY): I-stat hCG, quantitative: <5 m[IU]/mL (ref <5)

## 2019-04-20 MED ORDER — ALUM & MAG HYDROXIDE-SIMETH 200-200-20 MG/5ML PO SUSP
30.0000 mL | Freq: Once | ORAL | Status: AC
  Administered 2019-04-20: 15:00:00 30 mL via ORAL
  Filled 2019-04-20: qty 30

## 2019-04-20 MED ORDER — SODIUM CHLORIDE 0.9% FLUSH: 3.0000 mL | Freq: Once | INTRAVENOUS | Status: DC

## 2019-04-20 MED ORDER — ONDANSETRON 4 MG PO TBDP: 4.0000 mg | ORAL_TABLET | Freq: Three times a day (TID) | ORAL | 0 refills | Status: DC | PRN

## 2019-04-20 MED ORDER — LIDOCAINE VISCOUS HCL 2 % MT SOLN
15.0000 mL | Freq: Once | OROMUCOSAL | Status: AC
  Administered 2019-04-20: 15 mL via ORAL
  Filled 2019-04-20: qty 15

## 2019-04-20 MED ORDER — ONDANSETRON 4 MG PO TBDP
4.0000 mg | ORAL_TABLET | Freq: Once | ORAL | Status: AC
  Administered 2019-04-20: 4 mg via ORAL
  Filled 2019-04-20: qty 1

## 2019-04-20 MED FILL — BENAZEPRIL HCL 20 MG TABLET: 20 | 30 days supply | Qty: 30 | Fill #0

## 2019-04-20 MED FILL — ONDANSETRON ODT 4 MG TABLET: 4 | 6 days supply | Qty: 20 | Fill #0

## 2019-04-20 NOTE — ED Provider Notes (Signed)
[facility] EMERGENCY DEPARTMENT Provider Note   CSN: [id number] Arrival date & time: 04/20/19  1047     History Chief Complaint  Patient presents with  . Vomiting  . Abdominal Pain  . Hypertension    Christina Burke is a 58 y.o. female.  58 yo F with a chief complaints of nausea and vomiting.  This occurred after the patient was up all night drinking and eating hot wings.  Patient denied any pain with this.  She is most concerned about her blood pressure.  Said that she checked it multiple times during this event and it was elevated above her baseline.  She took multiple doses of her blood pressure medicine and was able to keep that down.  She felt that her blood pressure was not improving and so decided come to the ED for evaluation.  She denies chest pain or shortness of breath.  A mild headache with vomiting that is improved.  Denies cough congestion or fever.  The history is provided by the patient.  Hypertension Pertinent negatives include no chest pain, no headaches and no shortness of breath.  Illness Severity:  Moderate Onset quality:  Gradual Duration:  12 hours Timing:  Constant Progression:  Partially resolved Chronicity:  New Associated symptoms: nausea and vomiting   Associated symptoms: no chest pain, no congestion, no fever, no headaches, no myalgias, no rhinorrhea, no shortness of breath and no wheezing        Past Medical History:  Diagnosis Date  . Anxiety   . Cancer ([place])    pt. reports had uterine cancer x 10 years ago  . Chest pain 12/08/2013  . GERD (gastroesophageal reflux disease) 08/10/2014  . Hyperlipidemia   . Hypertension   . Murmur 12/08/2013    Patient Active Problem List   Diagnosis Date Noted  . Vitamin D deficiency 03/23/2018  . Hypertension   . Anxiety   . Hyperlipidemia   . GERD (gastroesophageal reflux disease) 08/10/2014  . HTN (hypertension) 01/25/2014  . Chest pain 12/08/2013  . Murmur 12/08/2013     Past Surgical History:  Procedure Laterality Date  . ABDOMINAL HYSTERECTOMY    . ABDOMINAL SURGERY       OB History    Gravida  3   Para      Term      Preterm      AB  1   Living  2     SAB  1   TAB      Ectopic      Multiple      Live Births  2           Family History  Problem Relation Age of Onset  . Gallbladder disease Mother   . Heart disease Father   . Colon polyps Sister   . Diabetes Sister        x3  . Diabetes Brother   . Breast cancer Paternal Aunt   . Colon cancer Neg Hx   . Kidney disease Neg Hx   . Esophageal cancer Neg Hx     Social History   Tobacco Use  . Smoking status: Never Smoker  . Smokeless tobacco: Never Used  Substance Use Topics  . Alcohol use: Yes    Alcohol/week: 1.0 - 2.0 standard drinks    Types: 1 - 2 Cans of beer per week    Comment: a night  . Drug use: No    Home Medications Prior to Admission medications  Medication Sig Start Date End Date Taking? Authorizing Provider  benazepril (LOTENSIN) 20 MG tablet Take 1 tablet (20 mg total) by mouth daily. 11/23/18   Charlott Rakes, MD  clonazePAM (KLONOPIN) 1 MG tablet Take 1 mg by mouth 2 (two) times daily.    [provider]  DULoxetine (CYMBALTA) 60 MG capsule Take 1 capsule (60 mg total) by mouth daily. For back pain 11/23/18   Charlott Rakes, MD  meloxicam (MOBIC) 7.5 MG tablet Take 1 tablet (7.5 mg total) by mouth daily. 11/23/18   Charlott Rakes, MD  methocarbamol (ROBAXIN) 500 MG tablet Take 1 tablet (500 mg total) by mouth every 8 (eight) hours as needed for muscle spasms. 11/23/18   Charlott Rakes, MD  ondansetron (ZOFRAN ODT) 4 MG disintegrating tablet Take 1 tablet (4 mg total) by mouth every 8 (eight) hours as needed for nausea or vomiting. 04/20/19   Deno Etienne, DO    Allergies    Amoxicillin and Penicillins  Review of Systems   Review of Systems  Constitutional: Negative for chills and fever.  HENT: Negative for congestion and  rhinorrhea.   Eyes: Negative for redness and visual disturbance.  Respiratory: Negative for shortness of breath and wheezing.   Cardiovascular: Negative for chest pain and palpitations.  Gastrointestinal: Positive for nausea and vomiting.  Genitourinary: Negative for dysuria and urgency.  Musculoskeletal: Negative for arthralgias and myalgias.  Skin: Negative for pallor and wound.  Neurological: Negative for dizziness and headaches.  58  Physical Exam Updated Vital Signs BP 133/72   Pulse (!) 112   Temp 98.6 F (37 C) (Oral)   Resp 15   SpO2 100%   Physical Exam Vitals and nursing note reviewed.  Constitutional:      General: She is not in acute distress.    Appearance: She is well-developed. She is obese. She is not diaphoretic.  HENT:     Head: Normocephalic and atraumatic.  Eyes:     Pupils: Pupils are equal, round, and reactive to light.  Cardiovascular:     Rate and Rhythm: Normal rate and regular rhythm.     Heart sounds: No murmur. No friction rub. No gallop.   Pulmonary:     Effort: Pulmonary effort is normal.     Breath sounds: No wheezing or rales.  Abdominal:     General: There is no distension.     Palpations: Abdomen is soft.     Tenderness: There is no abdominal tenderness.  Musculoskeletal:        General: No tenderness.     Cervical back: Normal range of motion and neck supple.  Skin:    General: Skin is warm and dry.  Neurological:     Mental Status: She is alert and oriented to person, place, and time.  Psychiatric:        Behavior: Behavior normal.     ED Results / Procedures / Treatments   Labs (all labs ordered are listed, but only abnormal results are displayed) Labs Reviewed  COMPREHENSIVE METABOLIC PANEL - Abnormal; Notable for the following components:      Result Value   Glucose, Bld 118 (*)    All other components within normal limits  CBC - Abnormal; Notable for the following components:   WBC 11.5 (*)    All other components  within normal limits  LIPASE, BLOOD  URINALYSIS, ROUTINE W REFLEX MICROSCOPIC  I-STAT BETA HCG BLOOD, ED ([facility], [facility], [facility] ONLY)    EKG None  Radiology No  results found.  Procedures Procedures (including critical care time)  Medications Ordered in ED Medications  sodium chloride flush (NS) 0.9 % injection 3 mL (has no administration in time range)  ondansetron (ZOFRAN-ODT) disintegrating tablet 4 mg (has no administration in time range)  alum & mag hydroxide-simeth (MAALOX/MYLANTA) 200-200-20 MG/5ML suspension 30 mL (has no administration in time range)    And  lidocaine (XYLOCAINE) 2 % viscous mouth solution 15 mL (has no administration in time range)    ED Course  I have reviewed the triage vital signs and the nursing notes.  Pertinent labs & imaging results that were available during my care of the patient were reviewed by me and considered in my medical decision making (see chart for details).    MDM Rules/Calculators/A&P                      58 yo F with a chief complaints of nausea and vomiting.  This occurred after the patient had hot wings and alcohol.  I think this is most likely the cause of her symptoms.  She denied any abdominal pain.  Has no abdominal pain on exam.  Her LFT's and lipase are unremarkable.  Patient is most concerned about her blood pressure.  This seems to have resolved without any intervention here.  I discussed with her that this typically would be followed up with the family doctor.  We will give nausea medicine and GI cocktail.  Have her take Zantac or Pepcid.  PCP follow-up.  2:35 PM:  I have discussed the diagnosis/risks/treatment options with the patient and believe the pt to be eligible for discharge home to follow-up with PCP. We also discussed returning to the ED immediately if new or worsening sx occur. We discussed the sx which are most concerning (e.g., sudden worsening pain, fever, inability to tolerate by mouth) that necessitate immediate  return. Medications administered to the patient during their visit and any new prescriptions provided to the patient are listed below.  Medications given during this visit Medications  sodium chloride flush (NS) 0.9 % injection 3 mL (has no administration in time range)  ondansetron (ZOFRAN-ODT) disintegrating tablet 4 mg (has no administration in time range)  alum & mag hydroxide-simeth (MAALOX/MYLANTA) 200-200-20 MG/5ML suspension 30 mL (has no administration in time range)    And  lidocaine (XYLOCAINE) 2 % viscous mouth solution 15 mL (has no administration in time range)     The patient appears reasonably screen and/or stabilized for discharge and I doubt any other medical condition or other [facility] requiring further screening, evaluation, or treatment in the ED at this time prior to discharge.   Final Clinical Impression(s) / ED Diagnoses Final diagnoses:  Nausea and vomiting in adult    Rx / DC Orders ED Discharge Orders         Ordered    ondansetron (ZOFRAN ODT) 4 MG disintegrating tablet  Every 8 hours PRN     04/20/19 [place], Golda Zavalza, DO 04/20/19 1436

## 2019-04-20 NOTE — ED Notes (Signed)
Discharge instructions and prescription discussed with Pt. Pt verbalized understanding. Pt stable and ambulatory.    

## 2019-04-20 NOTE — Discharge Instructions (Signed)
Try zantac or pepcid twice a day.  Try to avoid things that may make this worse, most commonly these are spicy foods tomato based products fatty foods chocolate and peppermint.  Alcohol and tobacco can also make this worse.  Return to the emergency department for sudden worsening pain fever or inability to eat or drink. ° °

## 2019-04-20 NOTE — ED Triage Notes (Addendum)
Had drinks and hot wings last night and vomited and her bp is up , took her bp med and it did not go down , her head hurts

## 2019-04-26 ENCOUNTER — Ambulatory Visit: Payer: Self-pay

## 2019-04-26 ENCOUNTER — Other Ambulatory Visit: Payer: Self-pay

## 2019-04-26 DIAGNOSIS — M25511 Pain in right shoulder: Secondary | ICD-10-CM

## 2019-04-26 DIAGNOSIS — G8929 Other chronic pain: Secondary | ICD-10-CM

## 2019-04-26 DIAGNOSIS — M5441 Lumbago with sciatica, right side: Secondary | ICD-10-CM

## 2019-04-26 DIAGNOSIS — M542 Cervicalgia: Secondary | ICD-10-CM

## 2019-04-26 NOTE — Therapy (Signed)
[place], [place], [address] Phone: [phone]   Fax:  [phone]  Physical Therapy Treatment  Patient Details  Name: Christina Burke MRN: [id number] Date of Birth: [birth year] Referring Provider (PT): Charlott Rakes, MD   Encounter Date: 04/26/2019  PT End of Session - 04/26/19 0957    Visit Number  7    Number of Visits  12    Date for PT Re-Evaluation  05/13/19    Authorization Type  CAFA    PT Start Time  0955    PT Stop Time  1050    PT Time Calculation (min)  55 min    Activity Tolerance  Patient limited by pain    Behavior During Therapy  [facility] for tasks assessed/performed       Past Medical History:  Diagnosis Date  . Anxiety   . Cancer ([place])    pt. reports had uterine cancer x 10 years ago  . Chest pain 12/08/2013  . GERD (gastroesophageal reflux disease) 08/10/2014  . Hyperlipidemia   . Hypertension   . Murmur 12/08/2013    Past Surgical History:  Procedure Laterality Date  . ABDOMINAL HYSTERECTOMY    . ABDOMINAL SURGERY      There were no vitals filed for this visit.  Subjective Assessment - 04/26/19 1001    Subjective  Pain in same places 7/10    Pain Score  7     Pain Location  Back    Pain Orientation  Right;Lower    Pain Descriptors / Indicators  Aching    Pain Type  Chronic pain    Pain Onset  More than a month ago    Pain Frequency  Constant    Aggravating Factors   all activity    Pain Score  7    Pain Location  Neck    Pain Orientation  Right    Pain Descriptors / Indicators  Tightness    Pain Type  Chronic pain    Pain Onset  More than a month ago    Pain Frequency  Constant    Pain Relieving Factors  meds , massage         [facility] PT Assessment - 04/26/19 0001      Observation/Other Assessments   Focus on Therapeutic Outcomes (FOTO)   60% limited , no change      AROM   Cervical - Right Rotation  70    Cervical - Left Rotation  70    Lumbar Flexion   WNL                   [facility] Adult PT Treatment/Exercise - 04/26/19 0001      Lumbar Exercises: Aerobic   Nustep  L5 x 8 min UE/LE      Moist Heat Therapy   Number Minutes Moist Heat  12 Minutes    Moist Heat Location  --   RT neck and back.      Manual Therapy   Joint Mobilization  central and RT lumbar spine    Soft tissue mobilization  Right lumbar region and RT neck and traps/        neck stretch Lt  Side bend , levator        PT Short Term Goals - 04/26/19 1046      PT SHORT TERM GOAL #1   Title  She will be indpendent with initial HEP    Status  Achieved  PT SHORT TERM GOAL #2   Title  She will report neck pain decreased 25% or more    Status  Not Met      PT SHORT TERM GOAL #3   Title  Back pain improved 10-20%    Status  Not Met        PT Long Term Goals - 04/26/19 1046      PT LONG TERM GOAL #1   Title  Independent with HEP    Status  On-going      PT LONG TERM GOAL #2   Title  Increase right cervical rotation AROM at least 10 deg to improve ability to turn head while driving    Baseline  70 degrees bilat today    Status  Achieved      PT LONG TERM GOAL #3   Title  Increase bilat. hip abd and ext strength 1/2 MMT grade to improve ability for lifting for chores    Baseline  not tested    Status  On-going      PT LONG TERM GOAL #4   Title  Tolerate sitting at least 30 min or greater for eating meals and car travel with neck/shoulder pain 3/10 or less    Baseline  7/10 pain with limited tolerance    Status  On-going      PT LONG TERM GOAL #5   Title  Perform chores, cleaning, IADLs at home with LBP 4/10 or less at worse    Baseline  7/10    Status  On-going      PT LONG TERM GOAL #6   Title  back FOTO decr to 50% limited    Baseline  60% limited    Status  Not Met            Plan - 04/26/19 0957    Clinical Impression Statement  No improvement. She has one visit left and CAFA runs out.   If this is restored    possibel resuming  PT in future.  But no progress with this episode so not sure PT will decrease pain significanty. Her movement patterns are generally smooth.    PT Treatment/Interventions  Electrical Stimulation;Iontophoresis 66m/ml Dexamethasone;Moist Heat;Therapeutic activities;Therapeutic exercise;Patient/family education;Manual techniques;Passive range of motion;Dry needling    PT Next Visit Plan  continue tx. focus lumbar vs. upper trap region pending symptoms, review HEP and add as needed    PT Home Exercise Plan  chin tucks, upper trap and levator stretches, corner pec stretch, scapular retractions, pelvic tilt, SKTC, LTR, supine marches and hip bridge    Consulted and Agree with Plan of Care  Patient       Patient will benefit from skilled therapeutic intervention in order to improve the following deficits and impairments:  Obesity, Pain, Postural dysfunction, Decreased strength, Decreased activity tolerance, Increased muscle spasms, Difficulty walking  Visit Diagnosis: Chronic right-sided low back pain with right-sided sciatica  Cervicalgia  Chronic right shoulder pain     Problem List Patient Active Problem List   Diagnosis Date Noted  . Vitamin D deficiency 03/23/2018  . Hypertension   . Anxiety   . Hyperlipidemia   . GERD (gastroesophageal reflux disease) 08/10/2014  . HTN (hypertension) 01/25/2014  . Chest pain 12/08/2013  . Murmur 12/08/2013    CDarrel Hoover PT 04/26/2019, 10:49 AM  [facility] [address]Phone: [phone]  Fax:  [phone] Name: Christina ShearMRN: [id number]  Date of Birth: March 22, 1961

## 2019-04-28 ENCOUNTER — Encounter: Payer: Self-pay | Admitting: Physical Therapy

## 2019-04-28 ENCOUNTER — Ambulatory Visit: Payer: Self-pay | Admitting: Physical Therapy

## 2019-04-28 ENCOUNTER — Other Ambulatory Visit: Payer: Self-pay

## 2019-04-28 DIAGNOSIS — M542 Cervicalgia: Secondary | ICD-10-CM

## 2019-04-28 DIAGNOSIS — G8929 Other chronic pain: Secondary | ICD-10-CM

## 2019-04-28 NOTE — Therapy (Signed)
[place], [place], [address] Phone: [phone]   Fax:  [phone]  Physical Therapy Treatment/Discharge  Patient Details  Name: Christina Burke MRN: [id number] Date of Birth: November 05, 1960 Referring Provider (PT): Charlott Rakes, MD   Encounter Date: 04/28/2019  PT End of Session - 04/28/19 1102    Visit Number  8    Number of Visits  12    Date for PT Re-Evaluation  05/13/19    Authorization Type  CAFA    PT Start Time  1015    PT Stop Time  1053    PT Time Calculation (min)  38 min    Activity Tolerance  Patient limited by pain    Behavior During Therapy  [facility] for tasks assessed/performed       Past Medical History:  Diagnosis Date  . Anxiety   . Cancer ([place])    pt. reports had uterine cancer x 10 years ago  . Chest pain 12/08/2013  . GERD (gastroesophageal reflux disease) 08/10/2014  . Hyperlipidemia   . Hypertension   . Murmur 12/08/2013    Past Surgical History:  Procedure Laterality Date  . ABDOMINAL HYSTERECTOMY    . ABDOMINAL SURGERY      There were no vitals filed for this visit.  Subjective Assessment - 04/28/19 1015    Subjective  No change in symptoms from last session. Temporary relief with HEP but no lasting relief.         [facility] PT Assessment - 04/28/19 0001      AROM   Cervical Flexion  22    Cervical Extension  32    Cervical - Right Side Bend  25    Cervical - Left Side Bend  22    Cervical - Right Rotation  73    Cervical - Left Rotation  70    Lumbar Flexion  WNL    Lumbar Extension  15    Lumbar - Right Side Bend  25    Lumbar - Left Side Bend  25    Lumbar - Right Rotation  WNL    Lumbar - Left Rotation  WNL      Strength   Overall Strength Comments  Bilat. UE grossly WNL, hip abd and ext 4/5 bilat. otherwise LE strength grossly WNL                   [facility] Adult PT Treatment/Exercise - 04/28/19 0001      Lumbar Exercises: Supine   Pelvic  Tilt  15 reps    Clam  15 reps    Clam Limitations  green band    Bent Knee Raise  15 reps    Bridge  15 reps      Manual Therapy   Manual therapy comments  gentle supine manual cervical traction    Joint Mobilization  LAD bilat. hips grade I-III    Soft tissue mobilization  Right lumbar region and RT neck and traps/       Neck Exercises: Stretches   Upper Trapezius Stretch  Right;3 reps;30 seconds             PT Education - 04/28/19 1101    Education Details  POC, HEP, pain education, self release for lumbar region with tennis ball, Theracane use for upper trap pain    Person(s) Educated  Patient    Methods  Explanation;Demonstration;Verbal cues    Comprehension  Returned demonstration;Verbalized understanding  PT Short Term Goals - 04/26/19 1046      PT SHORT TERM GOAL #1   Title  She will be indpendent with initial HEP    Status  Achieved      PT SHORT TERM GOAL #2   Title  She will report neck pain decreased 25% or more    Status  Not Met      PT SHORT TERM GOAL #3   Title  Back pain improved 10-20%    Status  Not Met        PT Long Term Goals - 04/28/19 1104      PT LONG TERM GOAL #1   Title  Independent with HEP    Baseline  met    Time  6    Period  Weeks    Status  Achieved      PT LONG TERM GOAL #2   Title  Increase right cervical rotation AROM at least 10 deg to improve ability to turn head while driving    Baseline  73 right, 70 left    Time  6    Period  Weeks    Status  Achieved      PT LONG TERM GOAL #3   Title  Increase bilat. hip abd and ext strength 1/2 MMT grade to improve ability for lifting for chores    Baseline  not met    Time  6    Period  Weeks    Status  Not Met      PT LONG TERM GOAL #4   Title  Tolerate sitting at least 30 min or greater for eating meals and car travel with neck/shoulder pain 3/10 or less    Baseline  7/10 pain with limited tolerance    Time  6    Status  Not Met      PT LONG TERM GOAL #5    Title  Perform chores, cleaning, IADLs at home with LBP 4/10 or less at worse    Baseline  7/10    Time  6    Period  Weeks    Status  Not Met            Plan - 04/28/19 1102    Clinical Impression Statement  No signficant or lasting improvement with therapy (temporary symptom ease with HEP). Given lack of progress plan d/c formal therapy-pt. will continue with HEP and recommend MD follow up in the future as needed for further tx. options.    Personal Factors and Comorbidities  Time since onset of injury/illness/exacerbation;Past/Current Experience    Examination-Activity Limitations  Locomotion Level;Sit;Bend;Lift    Examination-Participation Restrictions  Cleaning;Community Activity;Laundry;Meal Prep    Stability/Clinical Decision Making  Stable/Uncomplicated    Clinical Decision Making  Low    Rehab Potential  Fair    PT Frequency  2x / week    PT Duration  6 weeks    PT Treatment/Interventions  Electrical Stimulation;Iontophoresis 53m/ml Dexamethasone;Moist Heat;Therapeutic activities;Therapeutic exercise;Patient/family education;Manual techniques;Passive range of motion;Dry needling    PT Next Visit Plan  NA    PT Home Exercise Plan  chin tucks, upper trap and levator stretches, corner pec stretch, scapular retractions, pelvic tilt, SKTC, LTR, supine marches and hip bridge    Consulted and Agree with Plan of Care  Patient       Patient will benefit from skilled therapeutic intervention in order to improve the following deficits and impairments:  Obesity, Pain, Postural dysfunction, Decreased strength, Decreased activity  tolerance, Increased muscle spasms, Difficulty walking  Visit Diagnosis: Chronic right-sided low back pain with right-sided sciatica  Cervicalgia  Chronic right shoulder pain     Problem List Patient Active Problem List   Diagnosis Date Noted  . Vitamin D deficiency 03/23/2018  . Hypertension   . Anxiety   . Hyperlipidemia   . GERD  (gastroesophageal reflux disease) 08/10/2014  . HTN (hypertension) 01/25/2014  . Chest pain 12/08/2013  . Murmur 12/08/2013       PHYSICAL THERAPY DISCHARGE SUMMARY  Visits from Start of Care: 8  Current functional level related to goals / functional outcomes: See above, no lasting relief with therapy   Remaining deficits: Continued neck/shoulder and back pain   Education / Equipment: HEP  Plan: Patient agrees to discharge.  Patient goals were not met. Patient is being discharged due to lack of progress.  ?????           Beaulah Dinning, PT, DPT 04/28/19 11:07 AM      [facility] [address] [place], [place], [address] Phone: [phone]   Fax:  [phone]  Name: Christina Burke MRN: [id number] Date of Birth: [birth year]

## 2019-05-03 ENCOUNTER — Ambulatory Visit: Payer: Medicaid Other | Attending: Family Medicine

## 2019-05-03 ENCOUNTER — Other Ambulatory Visit: Payer: Self-pay

## 2019-05-04 ENCOUNTER — Encounter: Payer: Self-pay | Admitting: Family Medicine

## 2019-05-04 ENCOUNTER — Ambulatory Visit: Payer: Self-pay | Attending: Family Medicine | Admitting: Family Medicine

## 2019-05-04 VITALS — BP 115/72 | HR 74 | Temp 98.0°F | Ht 63.0 in | Wt 210.0 lb

## 2019-05-04 DIAGNOSIS — M62838 Other muscle spasm: Secondary | ICD-10-CM

## 2019-05-04 DIAGNOSIS — G8929 Other chronic pain: Secondary | ICD-10-CM

## 2019-05-04 DIAGNOSIS — M5441 Lumbago with sciatica, right side: Secondary | ICD-10-CM

## 2019-05-04 DIAGNOSIS — I10 Essential (primary) hypertension: Secondary | ICD-10-CM

## 2019-05-04 DIAGNOSIS — M542 Cervicalgia: Secondary | ICD-10-CM

## 2019-05-04 MED ORDER — BENAZEPRIL HCL 20 MG PO TABS: 20.0000 mg | ORAL_TABLET | Freq: Every day | ORAL | 6 refills | Status: DC

## 2019-05-04 MED ORDER — MELOXICAM 7.5 MG PO TABS: 7.5000 mg | ORAL_TABLET | Freq: Every day | ORAL | 6 refills | Status: DC

## 2019-05-04 MED ORDER — METHOCARBAMOL 500 MG PO TABS: 500.0000 mg | ORAL_TABLET | Freq: Three times a day (TID) | ORAL | 6 refills | Status: DC | PRN

## 2019-05-04 MED ORDER — DULOXETINE HCL 60 MG PO CPEP: 60.0000 mg | ORAL_CAPSULE | Freq: Every day | ORAL | 3 refills | Status: DC

## 2019-05-04 MED FILL — METHOCARBAMOL 500 MG TABS: 500 | 20 days supply | Qty: 60 | Fill #0

## 2019-05-04 MED FILL — DULoxetine HCL 60 MG CPEP: 60 | 30 days supply | Qty: 30 | Fill #0

## 2019-05-04 MED FILL — BENAZEPRIL HCL 20 MG TABLET: 20 | 30 days supply | Qty: 30 | Fill #0

## 2019-05-04 MED FILL — MELOXICAM 7.5 MG TABLET: 7.5 | 30 days supply | Qty: 30 | Fill #0

## 2019-05-04 NOTE — Patient Instructions (Signed)
Once I review your test results, I will be in touch with you via 'my chart' or a phone call from my office (if you are not signed up for my chart).  

## 2019-05-04 NOTE — Progress Notes (Signed)
Subjective:  Patient ID: Christina Burke, female    DOB: 06-18-1960  Age: 59 y.o. MRN: [id number]  CC: Hypertension   HPI Christina Burke  is a 59 year old female with history of hypertension, anxiety who presents today for follow-up visit.  She complains of pain which is a 7/10 now and she had a temporary relief with PT but she is done with that now. Pain is in R shoulder, R nech and R lower back. Pain worsens as the day progresses and sometimes radiates down her right upper extremity.  She denies presence of numbness or reduced hand strength. Currently on Meloxicam, Cymbalta and Robaxin.  Doing well on her antihypertensive with no adverse effects from her medications. Anxiety is followed by Psychiatry.  Past Medical History:  Diagnosis Date  . Anxiety   . Cancer ([place])    pt. reports had uterine cancer x 10 years ago  . Chest pain 12/08/2013  . GERD (gastroesophageal reflux disease) 08/10/2014  . Hyperlipidemia   . Hypertension   . Murmur 12/08/2013    Past Surgical History:  Procedure Laterality Date  . ABDOMINAL HYSTERECTOMY    . ABDOMINAL SURGERY      Family History  Problem Relation Age of Onset  . Gallbladder disease Mother   . Heart disease Father   . Colon polyps Sister   . Diabetes Sister        x3  . Diabetes Brother   . Breast cancer Paternal Aunt   . Colon cancer Neg Hx   . Kidney disease Neg Hx   . Esophageal cancer Neg Hx     Allergies  Allergen Reactions  . Amoxicillin Rash    Has patient had a PCN reaction causing immediate rash, facial/tongue/throat swelling, SOB or lightheadedness with hypotension: yes Has patient had a PCN reaction causing severe rash involving mucus membranes or skin necrosis: no Has patient had a PCN reaction that required hospitalization: no Has patient had a PCN reaction occurring within the last 10 years: no If all of the above answers are "NO", then may proceed with Cephalosporin use.   Marland Kitchen Penicillins Rash     Outpatient Medications Prior to Visit  Medication Sig Dispense Refill  . benazepril (LOTENSIN) 20 MG tablet Take 1 tablet (20 mg total) by mouth daily. 30 tablet 6  . clonazePAM (KLONOPIN) 1 MG tablet Take 1 mg by mouth 2 (two) times daily.    . DULoxetine (CYMBALTA) 60 MG capsule Take 1 capsule (60 mg total) by mouth daily. For back pain 30 capsule 3  . meloxicam (MOBIC) 7.5 MG tablet Take 1 tablet (7.5 mg total) by mouth daily. 30 tablet 2  . methocarbamol (ROBAXIN) 500 MG tablet Take 1 tablet (500 mg total) by mouth every 8 (eight) hours as needed for muscle spasms. 60 tablet 2  . ondansetron (ZOFRAN ODT) 4 MG disintegrating tablet Take 1 tablet (4 mg total) by mouth every 8 (eight) hours as needed for nausea or vomiting. 20 tablet 0   No facility-administered medications prior to visit.     ROS Review of Systems  Constitutional: Negative for activity change, appetite change and fatigue.  HENT: Negative for congestion, sinus pressure and sore throat.   Eyes: Negative for visual disturbance.  Respiratory: Negative for cough, chest tightness, shortness of breath and wheezing.   Cardiovascular: Negative for chest pain and palpitations.  Gastrointestinal: Negative for abdominal distention, abdominal pain and constipation.  Endocrine: Negative for polydipsia.  Genitourinary: Negative for dysuria and frequency.  Musculoskeletal:  See HPI  Skin: Negative for rash.  Neurological: Negative for tremors, light-headedness and numbness.  Hematological: Does not bruise/bleed easily.  Psychiatric/Behavioral: Negative for agitation and behavioral problems.    Objective:  BP 115/72   Pulse 74   Temp 98 F (36.7 C) (Oral)   Ht 5\' 3"  (1.6 m)   Wt 210 lb (95.3 kg)   SpO2 99%   BMI 37.20 kg/m   BP/Weight 05/04/2019 04/20/2019 [id number]  Systolic BP [id number]  Diastolic BP 72 74 80  Wt. (Lbs) 210 - 199.6  BMI 37.2 - 34.8      Physical Exam Constitutional:      Appearance:  She is well-developed.  Neck:     Vascular: No JVD.  Cardiovascular:     Rate and Rhythm: Normal rate.     Heart sounds: Normal heart sounds. No murmur.  Pulmonary:     Effort: Pulmonary effort is normal.     Breath sounds: Normal breath sounds. No wheezing or rales.  Chest:     Chest wall: No tenderness.  Abdominal:     General: Bowel sounds are normal. There is no distension.     Palpations: Abdomen is soft. There is no mass.     Tenderness: There is no abdominal tenderness.  Musculoskeletal:        General: Normal range of motion.     Right lower leg: No edema.     Left lower leg: No edema.     Comments: Tenderness to palpation of right side of neck Full range of motion of bilateral upper extremities; negative Hawkins and Neer signs Normal examination of lumbar spine; straight leg raise bilaterally is normal  Neurological:     Mental Status: She is alert and oriented to person, place, and time.  Psychiatric:        Mood and Affect: Mood normal.     CMP Latest Ref Rng & Units 04/20/2019 11/23/2018 03/23/2018  Glucose 70 - 99 mg/dL 118(H) 100(H) 85  BUN 6 - 20 mg/dL 9 16 15   Creatinine 0.44 - 1.00 mg/dL 0.77 0.85 0.65  Sodium 135 - 145 mmol/L 138 139 138  Potassium 3.5 - 5.1 mmol/L 4.0 4.4 4.6  Chloride 98 - 111 mmol/L 103 103 100  CO2 22 - 32 mmol/L 23 22 23   Calcium 8.9 - 10.3 mg/dL 9.5 9.4 9.8  Total Protein 6.5 - 8.1 g/dL 7.5 7.3 7.0  Total Bilirubin 0.3 - 1.2 mg/dL 0.7 0.5 0.3  Alkaline Phos 38 - 126 U/L 68 69 65  AST 15 - 41 U/L 23 18 13   ALT 0 - 44 U/L 24 15 14     Lipid Panel     Component Value Date/Time   CHOL 216 (H) 11/23/2018 1145   TRIG 106 11/23/2018 1145   HDL 59 11/23/2018 1145   CHOLHDL 3.7 11/23/2018 1145   CHOLHDL 3.9 04/08/2016 1025   VLDL 12 04/08/2016 1025   LDLCALC 136 (H) 11/23/2018 1145    CBC    Component Value Date/Time   WBC 11.5 (H) 04/20/2019 1123   RBC 4.61 04/20/2019 1123   HGB 14.0 04/20/2019 1123   HGB 14.6 11/18/2017  0849   HCT 42.2 04/20/2019 1123   HCT 43.5 11/18/2017 0849   PLT 311 04/20/2019 1123   PLT 276 11/18/2017 0849   MCV 91.5 04/20/2019 1123   MCV 93 11/18/2017 0849   MCH 30.4 04/20/2019 1123   MCHC 33.2 04/20/2019 1123   RDW 12.5 04/20/2019 1123  RDW 15.0 11/18/2017 0849   LYMPHSABS 2.1 11/18/2017 0849   MONOABS 0.3 03/01/2015 1600   EOSABS 0.1 11/18/2017 0849   BASOSABS 0.0 11/18/2017 0849    Lab Results  Component Value Date   HGBA1C 5.6 (A) 11/23/2018    Assessment & Plan:   1. Chronic midline low back pain with right-sided sciatica Uncontrolled Completed PT Recent weight gain could be contributory Counseled on weight loss Will refer for imaging and possibly referral for ESI if indicated - DULoxetine (CYMBALTA) 60 MG capsule; Take 1 capsule (60 mg total) by mouth daily. For back pain  Dispense: 30 capsule; Refill: 3 - DG Lumbar Spine Complete; Future  2. Essential hypertension Controlled Counseled on blood pressure goal of less than 130/80, low-sodium, DASH diet, medication compliance, 150 minutes of moderate intensity exercise per week. Discussed medication compliance, adverse effects. - benazepril (LOTENSIN) 20 MG tablet; Take 1 tablet (20 mg total) by mouth daily.  Dispense: 30 tablet; Refill: 6  3. Muscle spasm Uncontrolled See #1 above - meloxicam (MOBIC) 7.5 MG tablet; Take 1 tablet (7.5 mg total) by mouth daily.  Dispense: 30 tablet; Refill: 6 - methocarbamol (ROBAXIN) 500 MG tablet; Take 1 tablet (500 mg total) by mouth every 8 (eight) hours as needed for muscle spasms.  Dispense: 60 tablet; Refill: 6  4. Neck pain Uncontrolled See #1 above - DG Cervical Spine Complete; Future      Charlott Rakes, MD, FAAFP. [facility] [place], [place]   05/04/2019, 8:58 AM

## 2019-05-04 NOTE — Progress Notes (Signed)
Pain in shoulders, neck and back.

## 2019-05-18 MED FILL — BENAZEPRIL HCL 20 MG TABLET: 20 | 30 days supply | Qty: 30 | Fill #0

## 2019-05-18 MED FILL — DULoxetine HCL 60 MG CPEP: 60 | 30 days supply | Qty: 30 | Fill #0

## 2019-05-18 MED FILL — METHOCARBAMOL 500 MG TABS: 500 | 20 days supply | Qty: 60 | Fill #0

## 2019-05-18 MED FILL — MELOXICAM 7.5 MG TABLET: 7.5 | 30 days supply | Qty: 30 | Fill #0

## 2019-06-08 ENCOUNTER — Ambulatory Visit (HOSPITAL_COMMUNITY)
Admit: 2019-06-08 | Discharge: 2019-06-08 | Disposition: A | Discharge status: Home / Self Care | Payer: Self-pay | Source: Ambulatory Visit | Attending: Family Medicine | Admitting: Family Medicine

## 2019-06-08 ENCOUNTER — Ambulatory Visit (HOSPITAL_COMMUNITY)
Admission: RE | Admit: 2019-06-08 | Discharge: 2019-06-08 | Disposition: A | Discharge status: Home / Self Care | Payer: Self-pay | Source: Ambulatory Visit | Attending: Family Medicine | Admitting: Family Medicine

## 2019-06-08 DIAGNOSIS — G8929 Other chronic pain: Secondary | ICD-10-CM | POA: Insufficient documentation

## 2019-06-08 DIAGNOSIS — M5441 Lumbago with sciatica, right side: Secondary | ICD-10-CM

## 2019-06-08 DIAGNOSIS — M542 Cervicalgia: Secondary | ICD-10-CM

## 2019-06-08 IMAGING — DX DG CERVICAL SPINE COMPLETE 4+V
5 series · 5 of 5 positions shown · non-contrast
Comparison: None.

CLINICAL DATA: Cervical radiculopathy.

EXAM:
CERVICAL SPINE - COMPLETE 4+ VIEW

[c-spine lat]
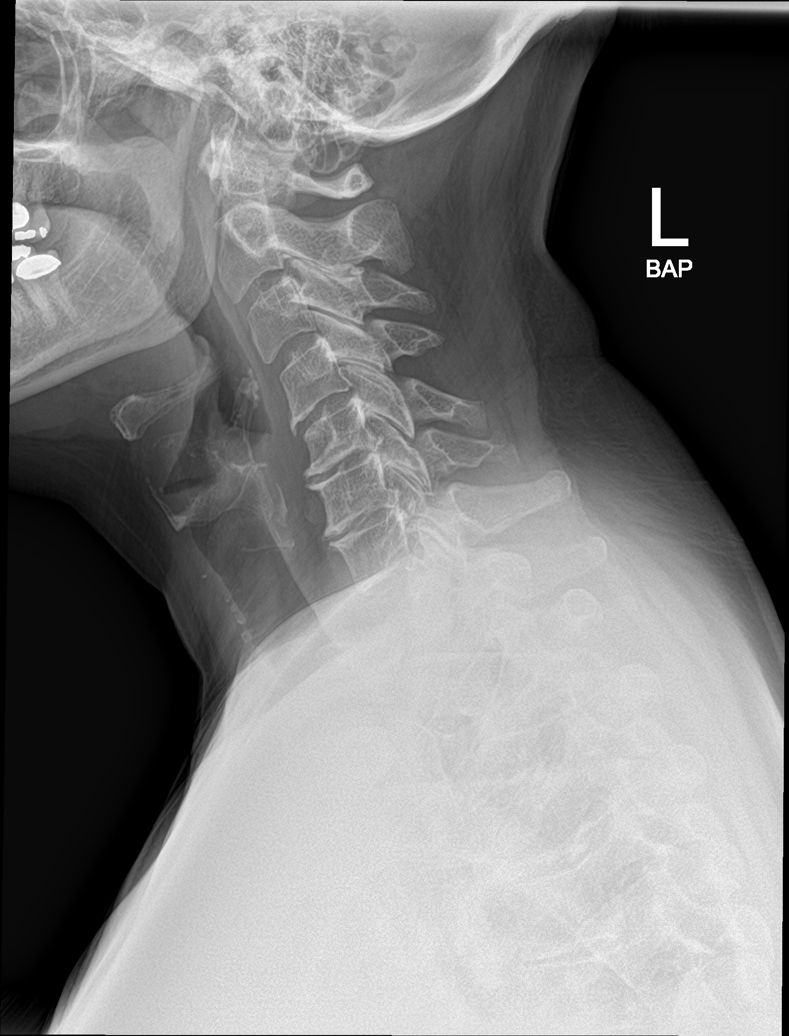

[c-spine obl (1 of 2)]
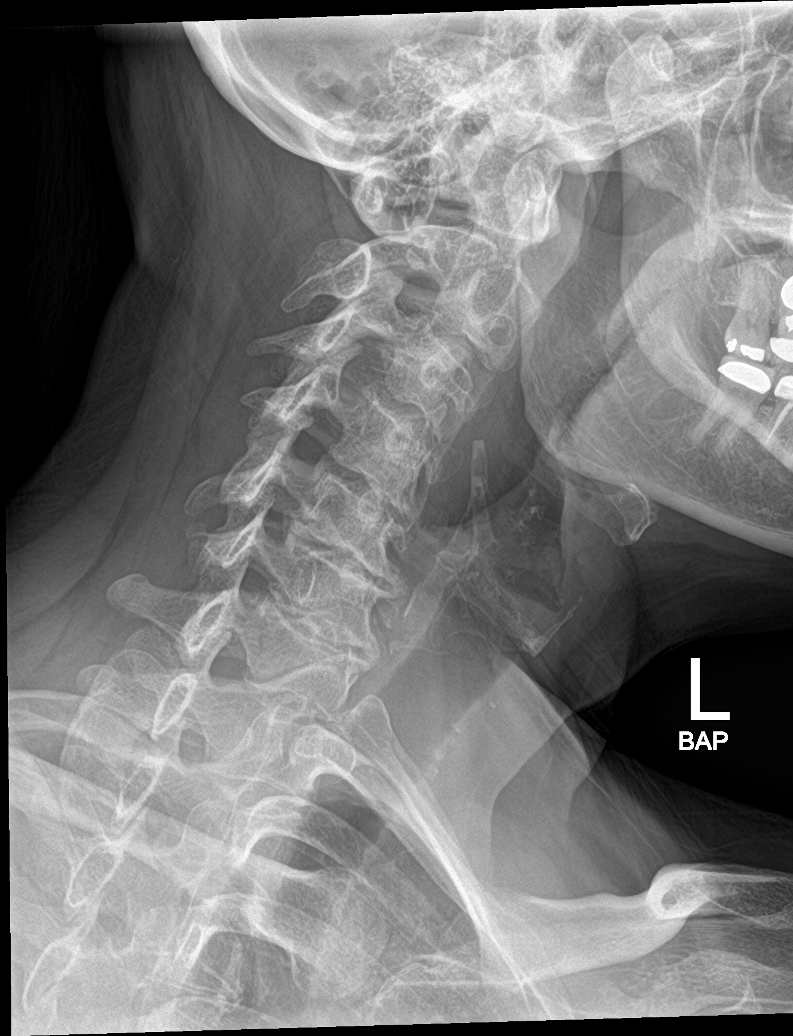

[c-spine obl (2 of 2)]
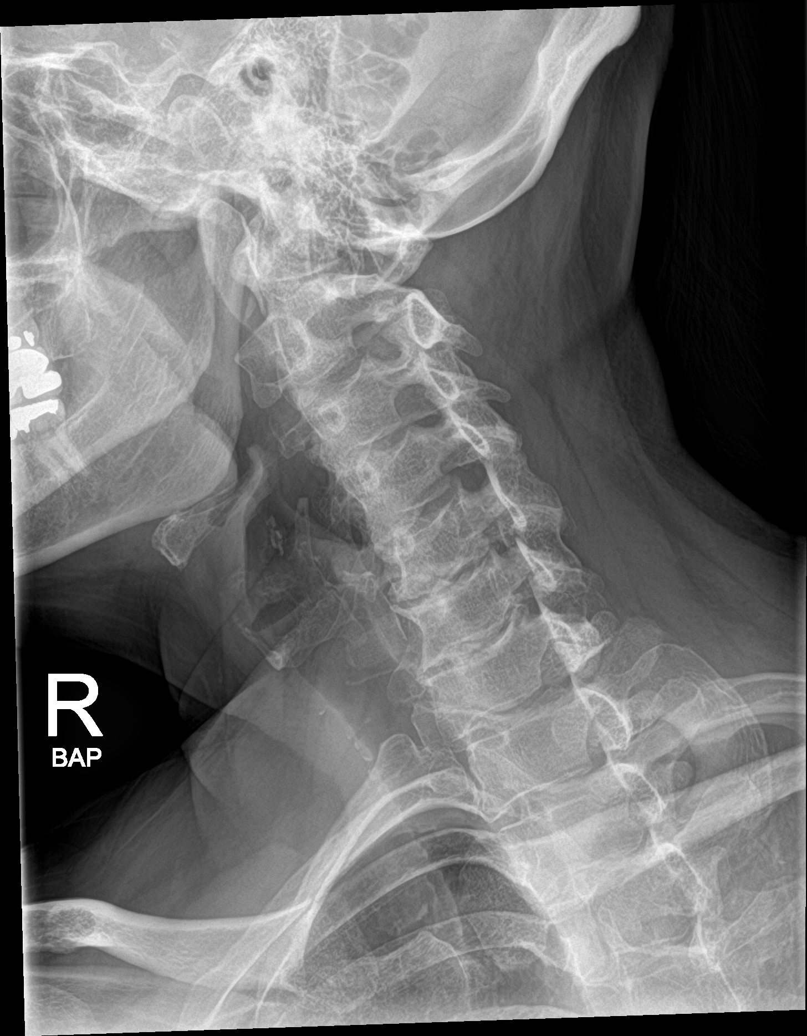

[c-spine ap]
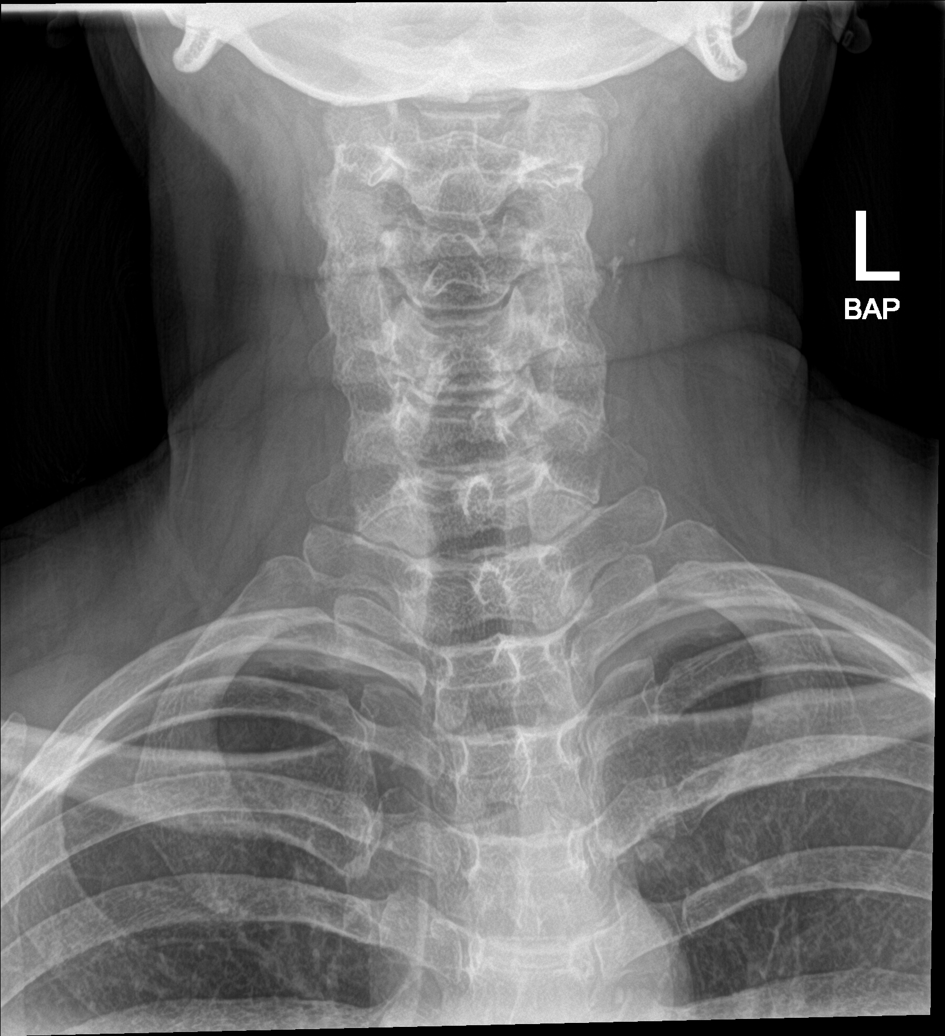

[c-spine open mouth]
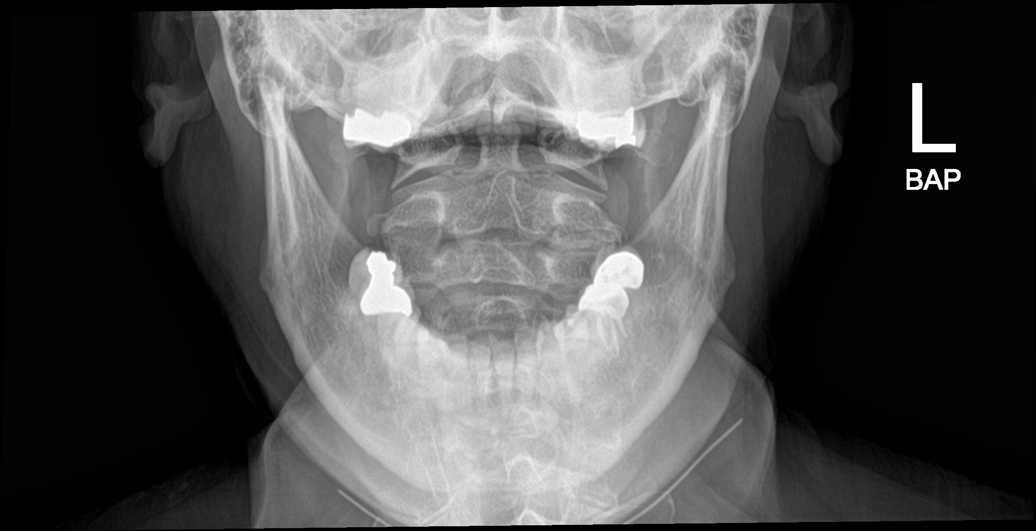

[5 of 5 positions shown; findings below may reference images not displayed]

FINDINGS: No fracture or spondylolisthesis is noted. No prevertebral soft
tissue swelling is noted. Moderate degenerative disc disease is
noted at C4-5, C5-6 and C6-7 with anterior osteophyte formation.
Mild bilateral neural foraminal stenosis is noted at C5-6 and C6-7
secondary to uncovertebral spurring.
IMPRESSION: Moderate multilevel degenerative disc disease with mild bilateral
neural foraminal stenosis secondary to uncovertebral spurring. No
acute abnormality seen in the cervical spine.

## 2019-06-08 IMAGING — DX DG LUMBAR SPINE COMPLETE 4+V
5 series · 5 of 5 positions shown · non-contrast
Comparison: [DATE]

CLINICAL DATA: Chronic low back pain

EXAM:
LUMBAR SPINE - COMPLETE 4+ VIEW

[l-spine obl (1 of 2)]
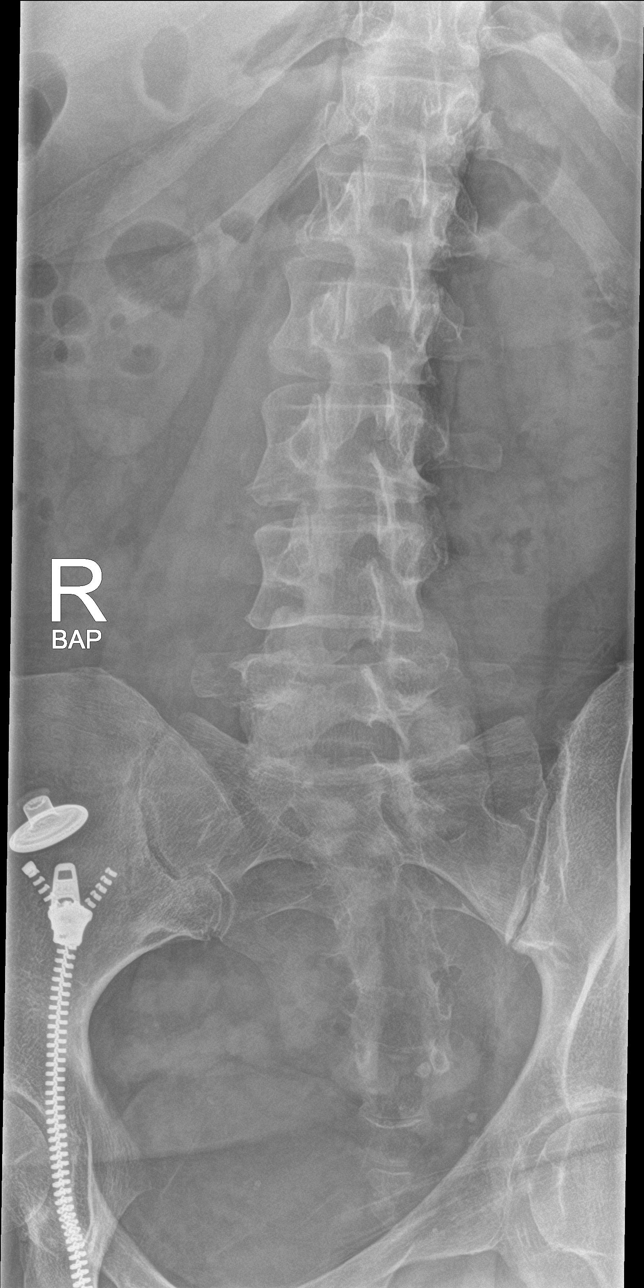

[l-spine obl (2 of 2)]
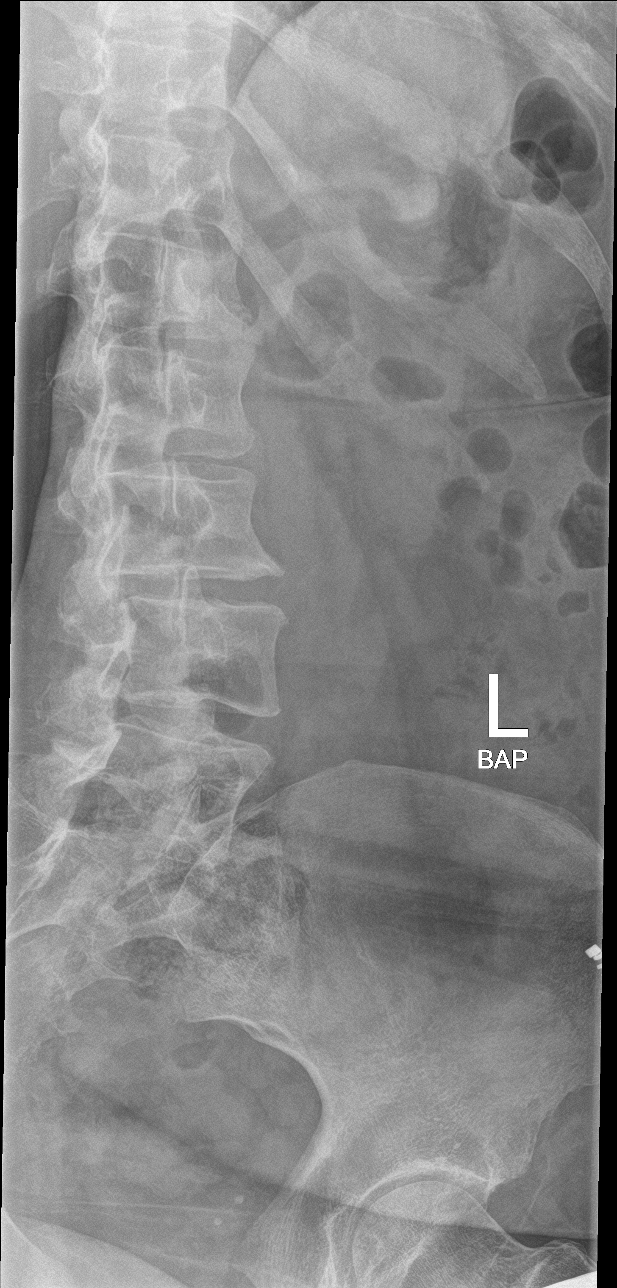

[l-spine lat]
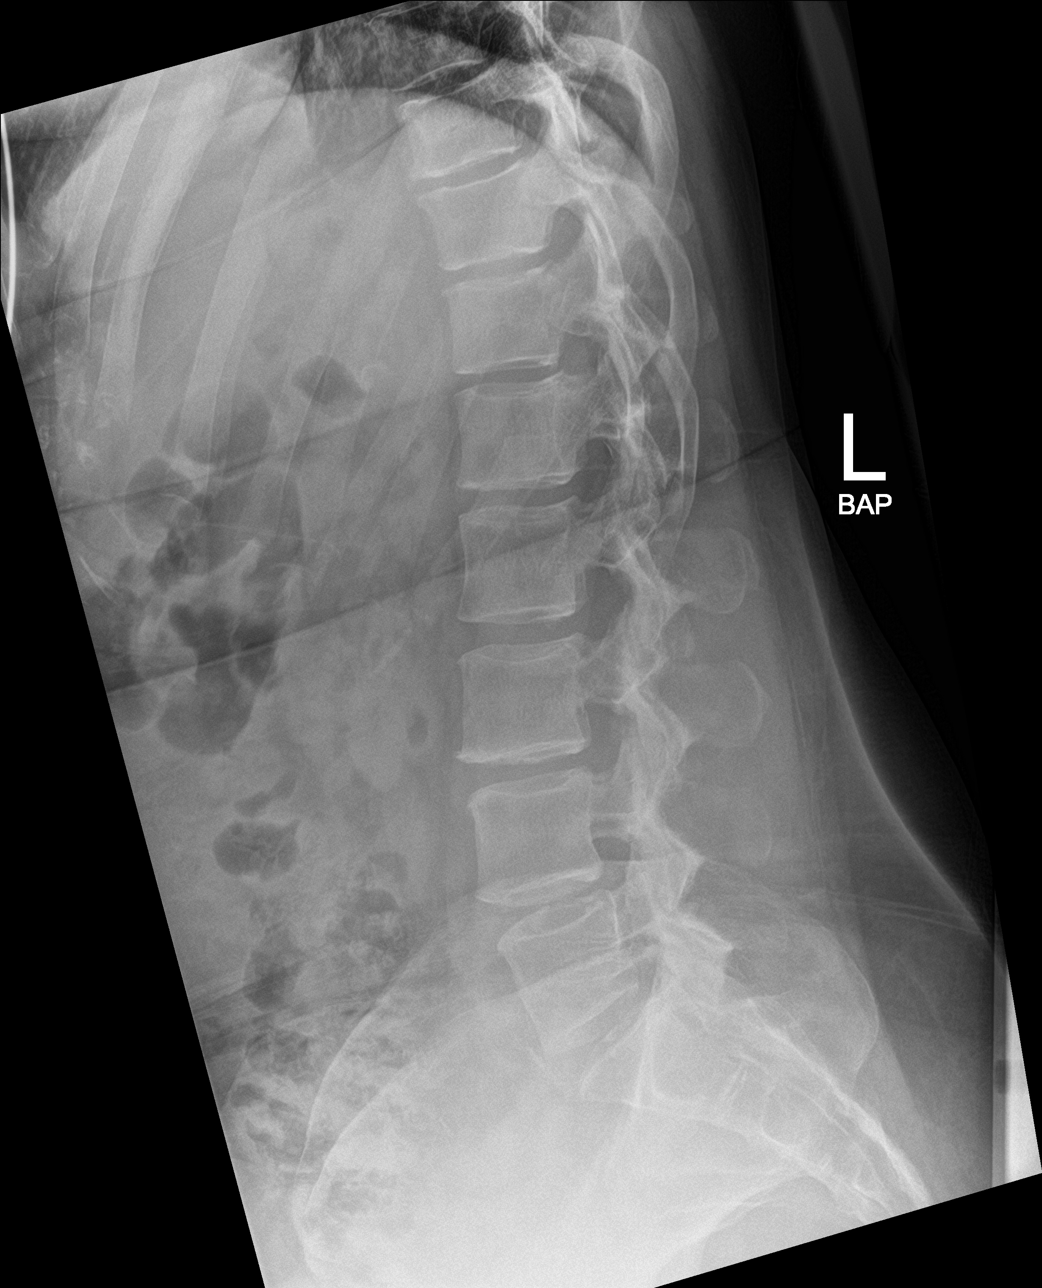

[l-spine spot]
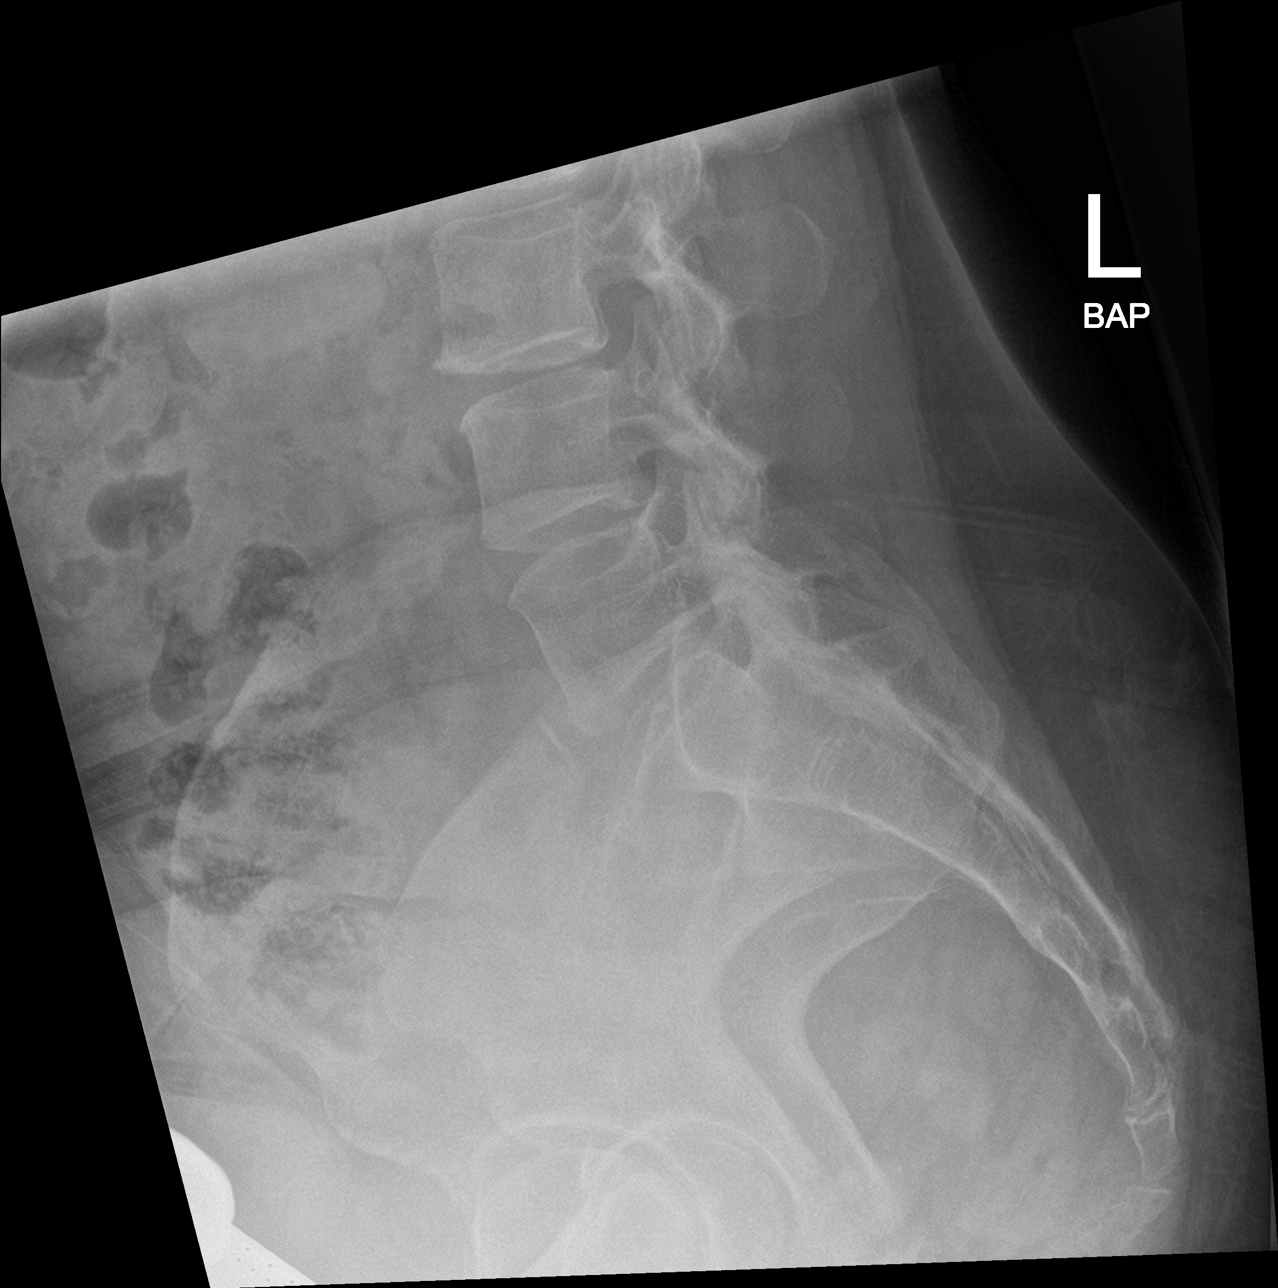

[l-spine ap]
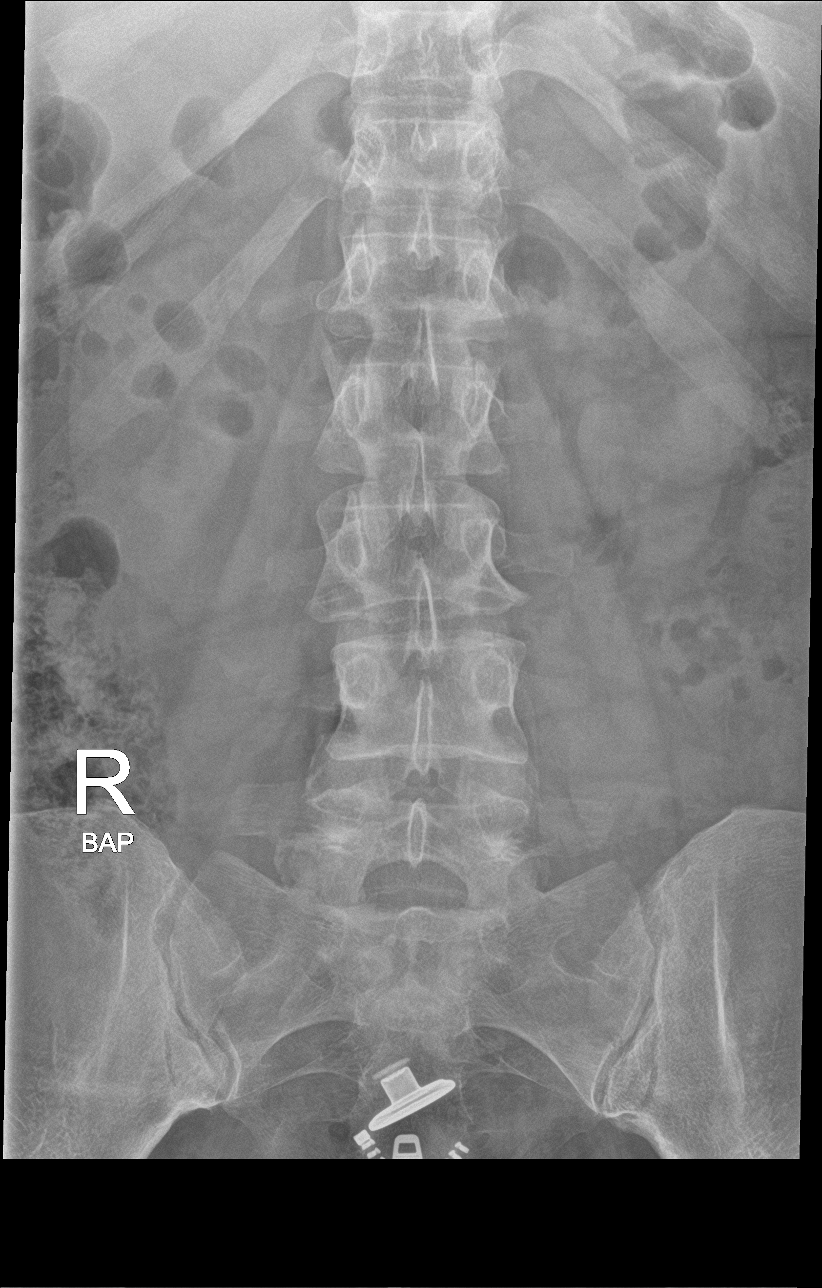

[5 of 5 positions shown; findings below may reference images not displayed]

FINDINGS: Five lumbar type vertebral bodies are well visualized. Vertebral
body height is well maintained. No anterolisthesis is seen. Mild
osteophytic changes are noted. No pars defects are seen.
IMPRESSION: Mild degenerative change without acute abnormality.

## 2019-06-14 ENCOUNTER — Telehealth: Payer: Self-pay | Admitting: Family Medicine

## 2019-06-14 DIAGNOSIS — G8929 Other chronic pain: Secondary | ICD-10-CM

## 2019-06-14 DIAGNOSIS — M542 Cervicalgia: Secondary | ICD-10-CM

## 2019-06-14 DIAGNOSIS — M5441 Lumbago with sciatica, right side: Secondary | ICD-10-CM

## 2019-06-14 NOTE — Telephone Encounter (Signed)
Patient is still having back pain, referral may be needed for patient.

## 2019-06-14 NOTE — Telephone Encounter (Signed)
Patient calling in regards to follow up treatment for her back xray.  Patient says she is still in a lot of pain and wasn't sure if she was going to need steroid shots.  Please contact patient for further treatment.

## 2019-06-14 NOTE — Telephone Encounter (Signed)
I have referred her.

## 2019-06-15 NOTE — Telephone Encounter (Signed)
Patient was called and informed of referral being placed. 

## 2019-06-16 ENCOUNTER — Encounter: Payer: Self-pay | Admitting: Physical Medicine and Rehabilitation

## 2019-06-23 ENCOUNTER — Emergency Department (HOSPITAL_COMMUNITY)
Admission: EM | Admit: 2019-06-23 | Discharge: 2019-06-23 | Disposition: A | Discharge status: Home / Self Care | Payer: Medicaid Other | Attending: Emergency Medicine | Admitting: Emergency Medicine

## 2019-06-23 ENCOUNTER — Other Ambulatory Visit: Payer: Self-pay

## 2019-06-23 ENCOUNTER — Ambulatory Visit: Payer: Medicaid Other | Admitting: Critical Care Medicine

## 2019-06-23 ENCOUNTER — Encounter (HOSPITAL_COMMUNITY): Payer: Self-pay | Admitting: Emergency Medicine

## 2019-06-23 DIAGNOSIS — J029 Acute pharyngitis, unspecified: Secondary | ICD-10-CM | POA: Insufficient documentation

## 2019-06-23 DIAGNOSIS — I10 Essential (primary) hypertension: Secondary | ICD-10-CM | POA: Insufficient documentation

## 2019-06-23 DIAGNOSIS — Z20822 Contact with and (suspected) exposure to covid-19: Secondary | ICD-10-CM | POA: Insufficient documentation

## 2019-06-23 DIAGNOSIS — Z79899 Other long term (current) drug therapy: Secondary | ICD-10-CM | POA: Insufficient documentation

## 2019-06-23 LAB — GROUP A STREP BY PCR: Group A Strep by PCR: NOT DETECTED

## 2019-06-23 LAB — SARS CORONAVIRUS 2 (TAT 6-24 HRS): SARS Coronavirus 2: NEGATIVE

## 2019-06-23 MED ORDER — PREDNISONE 20 MG PO TABS
60.0000 mg | ORAL_TABLET | Freq: Once | ORAL | Status: AC
  Administered 2019-06-23: 60 mg via ORAL
  Filled 2019-06-23: qty 3

## 2019-06-23 MED ORDER — ACETAMINOPHEN 325 MG PO TABS
650.0000 mg | ORAL_TABLET | Freq: Once | ORAL | Status: AC
  Administered 2019-06-23: 650 mg via ORAL
  Filled 2019-06-23: qty 2

## 2019-06-23 NOTE — ED Triage Notes (Signed)
Pt reports for past 4 days had sore throat and when she wakes up in the mornings her eyes burn.

## 2019-06-23 NOTE — ED Provider Notes (Signed)
[facility] DEPT Provider Note   CSN: [id number] Arrival date & time: 06/23/19  [id number]     History Chief Complaint  Patient presents with  . Sore Throat  . Eye Pain    Christina Burke is a 59 y.o. female.  HPI  59 yo female co sore throat and eyes burning for 3 days.  No fever, chills, known covid exposure.  Patient feels right side of her little more swelling on the side.  She is able to speak, swallow without difficulty.     Past Medical History:  Diagnosis Date  . Anxiety   . Cancer ([place])    pt. reports had uterine cancer x 10 years ago  . Chest pain 12/08/2013  . GERD (gastroesophageal reflux disease) 08/10/2014  . Hyperlipidemia   . Hypertension   . Murmur 12/08/2013    Patient Active Problem List   Diagnosis Date Noted  . Vitamin D deficiency 03/23/2018  . Hypertension   . Anxiety   . Hyperlipidemia   . GERD (gastroesophageal reflux disease) 08/10/2014  . HTN (hypertension) 01/25/2014  . Chest pain 12/08/2013  . Murmur 12/08/2013    Past Surgical History:  Procedure Laterality Date  . ABDOMINAL HYSTERECTOMY    . ABDOMINAL SURGERY       OB History    Gravida  3   Para      Term      Preterm      AB  1   Living  2     SAB  1   TAB      Ectopic      Multiple      Live Births  2           Family History  Problem Relation Age of Onset  . Gallbladder disease Mother   . Heart disease Father   . Colon polyps Sister   . Diabetes Sister        x3  . Diabetes Brother   . Breast cancer Paternal Aunt   . Colon cancer Neg Hx   . Kidney disease Neg Hx   . Esophageal cancer Neg Hx     Social History   Tobacco Use  . Smoking status: Never Smoker  . Smokeless tobacco: Never Used  Substance Use Topics  . Alcohol use: Yes    Alcohol/week: 1.0 - 2.0 standard drinks    Types: 1 - 2 Cans of beer per week    Comment: a night  . Drug use: No    Home Medications Prior to Admission medications     Medication Sig Start Date End Date Taking? Authorizing Provider  benazepril (LOTENSIN) 20 MG tablet Take 1 tablet (20 mg total) by mouth daily. 05/04/19   Charlott Rakes, MD  clonazePAM (KLONOPIN) 1 MG tablet Take 1 mg by mouth 2 (two) times daily.    [provider]  DULoxetine (CYMBALTA) 60 MG capsule Take 1 capsule (60 mg total) by mouth daily. For back pain 05/04/19   Charlott Rakes, MD  meloxicam (MOBIC) 7.5 MG tablet Take 1 tablet (7.5 mg total) by mouth daily. 05/04/19   Charlott Rakes, MD  methocarbamol (ROBAXIN) 500 MG tablet Take 1 tablet (500 mg total) by mouth every 8 (eight) hours as needed for muscle spasms. 05/04/19   Charlott Rakes, MD  ondansetron (ZOFRAN ODT) 4 MG disintegrating tablet Take 1 tablet (4 mg total) by mouth every 8 (eight) hours as needed for nausea or vomiting. 04/20/19   Tyrone Nine,  Dan, DO    Allergies    Amoxicillin and Penicillins  Review of Systems   Review of Systems  All other systems reviewed and are negative.   Physical Exam Updated Vital Signs BP 123/81 (BP Location: Left Arm)   Pulse 88   Temp 98.3 F (36.8 C) (Oral)   Resp 18   SpO2 98%   Physical Exam Vitals reviewed.  Constitutional:      Appearance: She is well-developed.  HENT:     Head: Normocephalic.     Right Ear: Ear canal normal.     Left Ear: Ear canal normal.     Mouth/Throat:     Mouth: Mucous membranes are moist.     Pharynx: Oropharynx is clear.     Tonsils: No tonsillar exudate.     Comments: Bilateral mild swelling tonsils , no exudate or lateralized swelling noted Eyes:     Conjunctiva/sclera: Conjunctivae normal.  Neck:     Comments: Mild adenopathy palpated sm area Floor of mouth normal Cardiovascular:     Rate and Rhythm: Normal rate and regular rhythm.  Pulmonary:     Effort: Pulmonary effort is normal.     Breath sounds: Normal breath sounds.  Abdominal:     General: Bowel sounds are normal.     Palpations: Abdomen is soft.  Musculoskeletal:      Cervical back: Normal range of motion and neck supple.  Skin:    General: Skin is warm.     Capillary Refill: Capillary refill takes less than 2 seconds.  Neurological:     General: No focal deficit present.     Mental Status: She is alert.  Psychiatric:        Mood and Affect: Mood normal.     ED Results / Procedures / Treatments   Labs (all labs ordered are listed, but only abnormal results are displayed) Labs Reviewed  GROUP A STREP BY PCR  SARS CORONAVIRUS 2 (TAT 6-24 HRS)    EKG None  Radiology No results found.  Procedures Procedures (including critical care time)  Medications Ordered in ED Medications - No data to display  ED Course  I have reviewed the triage vital signs and the nursing notes.  Pertinent labs & imaging results that were available during my care of the patient were reviewed by me and considered in my medical decision making (see chart for details).    MDM Rules/Calculators/A&P                      Pharyngitis DDX- strep/mono/covid/other infectious etiology STrep done and negative Plan symptomatic tx Covid pending.  Final Clinical Impression(s) / ED Diagnoses Final diagnoses:  Pharyngitis, unspecified etiology    Rx / DC Orders ED Discharge Orders    None       Pattricia Boss, MD 06/23/19 1106

## 2019-06-28 ENCOUNTER — Encounter: Payer: Self-pay | Admitting: Physical Medicine and Rehabilitation

## 2019-06-28 ENCOUNTER — Other Ambulatory Visit: Payer: Self-pay

## 2019-06-28 ENCOUNTER — Encounter
Payer: No Typology Code available for payment source | Attending: Physical Medicine and Rehabilitation | Admitting: Physical Medicine and Rehabilitation

## 2019-06-28 VITALS — BP 103/72 | HR 80 | Temp 97.5°F | Ht 63.5 in | Wt 198.4 lb

## 2019-06-28 DIAGNOSIS — M5441 Lumbago with sciatica, right side: Secondary | ICD-10-CM

## 2019-06-28 DIAGNOSIS — G8929 Other chronic pain: Secondary | ICD-10-CM | POA: Insufficient documentation

## 2019-06-28 DIAGNOSIS — M5116 Intervertebral disc disorders with radiculopathy, lumbar region: Secondary | ICD-10-CM | POA: Insufficient documentation

## 2019-06-28 MED ORDER — DULOXETINE HCL 60 MG PO CPEP: 60.0000 mg | ORAL_CAPSULE | Freq: Two times a day (BID) | ORAL | 3 refills | Status: DC

## 2019-06-28 MED FILL — DULoxetine HCL 60 MG CPEP: 60 | 30 days supply | Qty: 60 | Fill #0

## 2019-06-28 NOTE — Progress Notes (Signed)
Subjective:    Patient ID: Christina Burke, female    DOB: 07/30/1960, 59 y.o.   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   Her worst pain is in her lumbar spine and she does have pain radiating down her right leg laterally. She had a lumbar XR on 2/10 that was personally reviewed and shows mild degenerative changes without acute abnormalities. The pain is 7/10 and aching and burning in quality. It is severe both in her back and leg at times. She has had epidural spinal injections in the past with good benefit.  She did PT which mostly consisted of dry needling of the muscles of her upper back for her neck pain and did not help much. She would be interested in another epidural injection if this could help her.   She is currently taking Cymbalta for depression and tolerates this well.   Pain Inventory Average Pain 7 Pain Right Now 7 My pain is constant and aching  In the last 24 hours, has pain interfered with the following? General activity 7 Relation with others 7 Enjoyment of life 7 What TIME of day is your pain at its worst? evening Sleep (in general) Poor  Pain is worse with: walking, bending, sitting, standing and some activites Pain improves with: injections Relief from Meds: 2  Mobility walk without assistance how many minutes can you walk? 10 ability to climb steps?  yes do you drive?  yes  Function not employed: date last employed . I need assistance with the following:  household duties and shopping  Neuro/Psych weakness trouble walking anxiety  Prior Studies x-rays  Physicians involved in your care Primary care . Rheumatologist .   Family History  Problem Relation Age of Onset  . Gallbladder disease Mother   . Heart disease Father   . Colon  polyps Sister   . Diabetes Sister        x3  . Diabetes Brother   . Breast cancer Paternal Aunt   . Colon cancer Neg Hx   . Kidney disease Neg Hx   . Esophageal cancer Neg Hx    Social History   Socioeconomic History  . Marital status: Legally Separated    Spouse name: Not on file  . Number of children: 2  . Years of education: Not on file  . Highest education level: Not on file  Occupational History  . Occupation: Unemployed  Tobacco Use  . Smoking status: Never Smoker  . Smokeless tobacco: Never Used  Substance and Sexual Activity  . Alcohol use: Yes    Alcohol/week: 1.0 - 2.0 standard drinks    Types: 1 - 2 Cans of beer per week    Comment: a night  . Drug use: No  . Sexual activity: Yes    Birth control/protection: None  Other Topics Concern  . Not on file  Social History Narrative  . Not on file   Social Determinants of Health   Financial Resource Strain:   . Difficulty of Paying Living Expenses: Not on file  Food Insecurity:   . Worried About Charity fundraiser in the Last Year: Not on file  . Ran Out of Food in the Last Year: Not on file  Transportation Needs:   .  Lack of Transportation (Medical): Not on file  . Lack of Transportation (Non-Medical): Not on file  Physical Activity:   . Days of Exercise per Week: Not on file  . Minutes of Exercise per Session: Not on file  Stress:   . Feeling of Stress : Not on file  Social Connections:   . Frequency of Communication with Friends and Family: Not on file  . Frequency of Social Gatherings with Friends and Family: Not on file  . Attends Religious Services: Not on file  . Active Member of Clubs or Organizations: Not on file  . Attends Archivist Meetings: Not on file  . Marital Status: Not on file   Past Surgical History:  Procedure Laterality Date  . ABDOMINAL HYSTERECTOMY    . ABDOMINAL SURGERY     Past Medical History:  Diagnosis Date  . Anxiety   . Cancer ([place])    pt. reports had  uterine cancer x 10 years ago  . Chest pain 12/08/2013  . GERD (gastroesophageal reflux disease) 08/10/2014  . Hyperlipidemia   . Hypertension   . Murmur 12/08/2013   BP 103/72   Pulse 80   Temp (!) 97.5 F (36.4 C)   Ht 5' 3.5" (1.613 m)   Wt 198 lb 6.4 oz (90 kg)   SpO2 97%   BMI 34.59 kg/m   Opioid Risk Score:   Fall Risk Score:  `1  Depression screen PHQ 2/9  Depression screen [facility] 2/9 06/28/2019 05/04/2019 11/23/2018 06/22/2018 03/23/2018 01/29/2018 01/25/2018  Decreased Interest 0 1 1 1 1 1  0  Down, Depressed, Hopeless 0 0 1 1 1  0 1  PHQ - 2 Score 0 1 2 2 2 1 1   Altered sleeping 3 2 2 1  0 1 1  Tired, decreased energy 0 1 0 1 1 1 1   Change in appetite 0 1 2 1  0 1 0  Feeling bad or failure about yourself  0 0 0 0 0 0 0  Trouble concentrating 0 0 0 0 0 0 1  Moving slowly or fidgety/restless 0 0 0 0 0 0 0  Suicidal thoughts 0 0 0 0 0 0 0  PHQ-9 Score 3 5 6 5 3 4 4   Difficult doing work/chores Somewhat difficult - - - - - -  Some recent data might be hidden     Review of Systems  Constitutional: Positive for unexpected weight change.  Musculoskeletal: Positive for gait problem.  Neurological: Positive for weakness.  Psychiatric/Behavioral: The patient is nervous/anxious.   All other systems reviewed and are negative.      Objective:   Physical Exam  Gen: no distress, normal appearing HEENT: oral mucosa pink and moist, NCAT Cardio: Reg rate Chest: normal effort, normal rate of breathing Abd: soft, non-distended Ext: no edema Skin: intact Neuro: AOx3.  Musculoskeletal: 4.5 strength in right side as compared to left side.+ slump test on left side. Sensation is intact throughout.  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   1) Right sided Radiculopathy, with possible  compression of L5 nerve root  -Lumbar XR personally reviewed as above and discussed with patient.   -MRI lumbar spine to assess for nerve root compression and to assess whether patient would benefit from  epidural spinal injection.  -Increase dose of Cymbalta to 60mg  BID for better pain control.   2) Cervical stenosis -Currently without weakness, sensory deficits or neuropathic pain in arms, will continue to monitor.  -Offered trigger point injections but advised this would only provide temporary relief for neuropathic pain.   All questions answered. RTC in 1 month. I will call beforehand to discuss results of MRI.

## 2019-07-04 MED FILL — BENAZEPRIL HCL 20 MG TABLET: 20 | 30 days supply | Qty: 30 | Fill #1

## 2019-07-04 MED FILL — METHOCARBAMOL 500 MG TABS: 500 | 20 days supply | Qty: 60 | Fill #1

## 2019-07-06 ENCOUNTER — Other Ambulatory Visit: Payer: Self-pay

## 2019-07-06 ENCOUNTER — Ambulatory Visit (HOSPITAL_COMMUNITY)
Admission: RE | Admit: 2019-07-06 | Discharge: 2019-07-06 | Disposition: A | Discharge status: Home / Self Care | Payer: No Typology Code available for payment source | Source: Ambulatory Visit | Attending: Physical Medicine and Rehabilitation | Admitting: Physical Medicine and Rehabilitation

## 2019-07-06 DIAGNOSIS — G8929 Other chronic pain: Secondary | ICD-10-CM | POA: Insufficient documentation

## 2019-07-06 DIAGNOSIS — M5441 Lumbago with sciatica, right side: Secondary | ICD-10-CM | POA: Insufficient documentation

## 2019-07-06 IMAGING — MR MR LUMBAR SPINE W/O CM
4 of 5 series · 30 of 48 positions shown · non-contrast
Comparison: Radiographs from [DATE]

CLINICAL DATA: Chronic pain in multiple joints. Lumbar
radiculopathy.

EXAM:
MRI LUMBAR SPINE WITHOUT CONTRAST
TECHNIQUE: Multiplanar, multisequence MR imaging of the lumbar spine was
performed. No intravenous contrast was administered.

[Series 5: T1 · sagittal · 4.0mm · 0.81mm/px · 5 of 14 slices shown (1 of 2)]
[im 1/14]
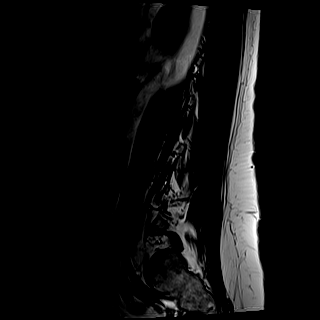
[im 4/14]
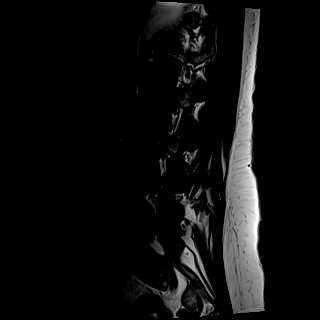
[im 7/14]
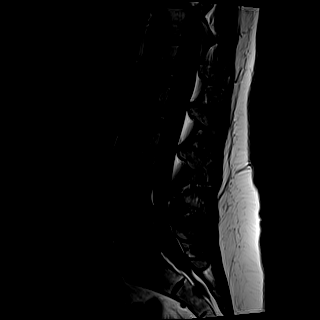
[im 10/14]
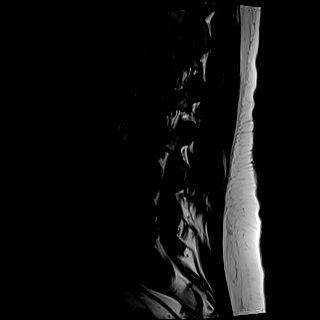
[im 14/14]
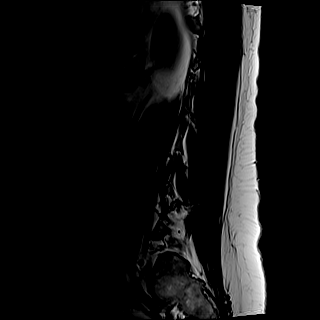

[Series 6: T2 · sagittal · 4.0mm · 0.81mm/px · 5 of 14 slices shown (1 of 2)]
[im 1/14]
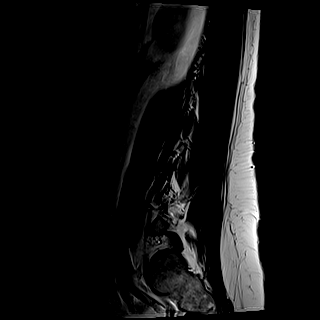
[im 4/14]
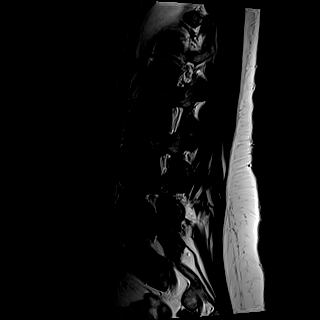
[im 7/14]
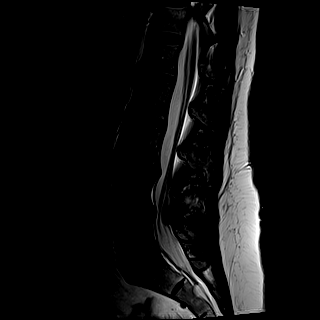
[im 10/14]
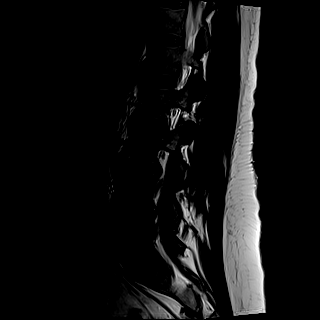
[im 14/14]
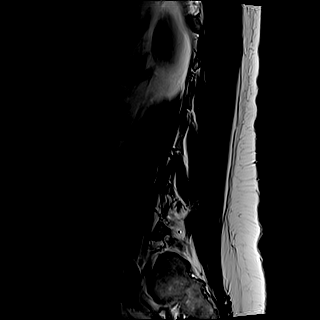

[Series 8: T2 · axial · 4.0mm · 0.62mm/px · z∈[-83,+121]mm · 10 of 40 slices shown (2 of 2)]
[im 3/40]
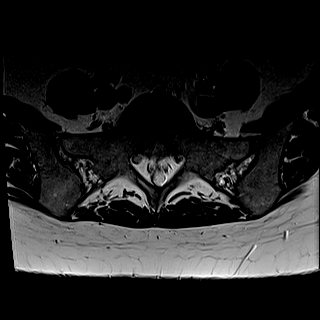
[im 6/40]
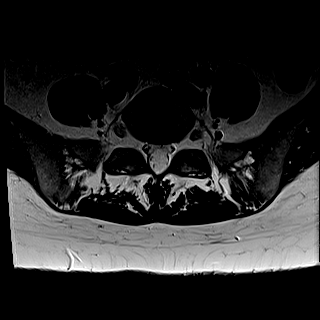
[im 8/40]
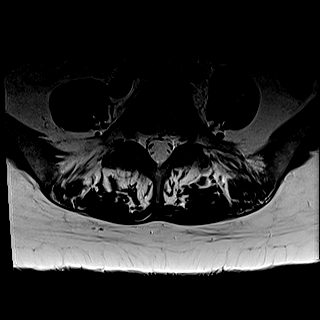
[im 14/40]
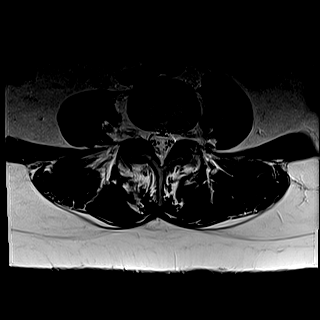
[im 19/40]
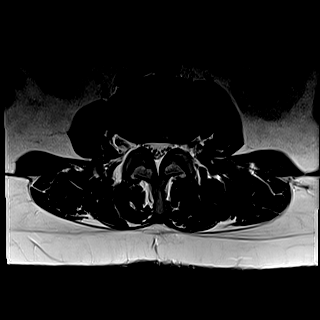
[im 21/40]
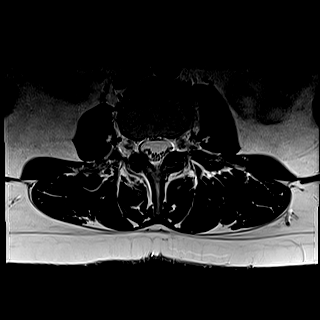
[im 24/40]
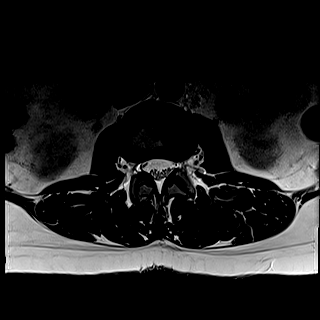
[im 29/40]
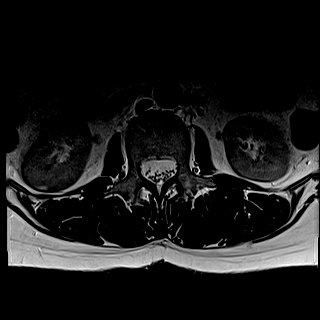
[im 34/40]
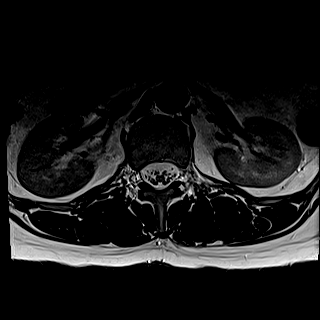
[im 40/40]
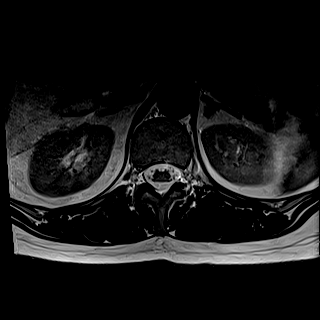

[Series 9: T1 · axial · 4.0mm · 0.43mm/px · z∈[-83,+122]mm · 10 of 40 slices shown (2 of 2)]
[im 3/40]
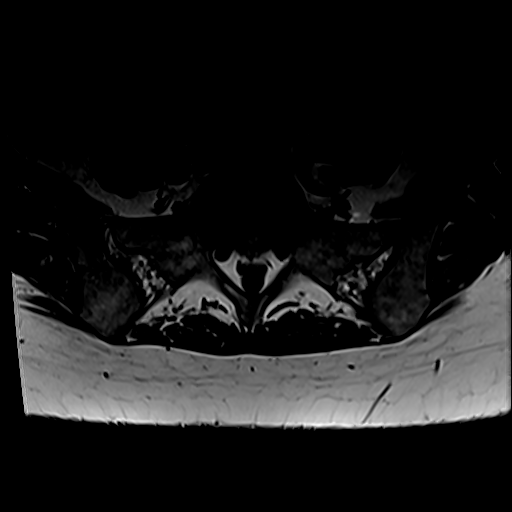
[im 6/40]
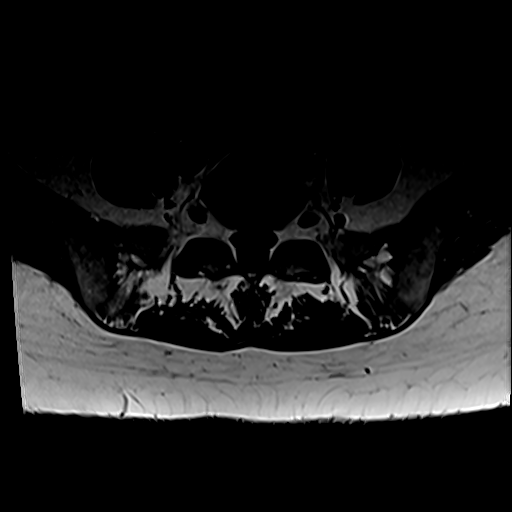
[im 8/40]
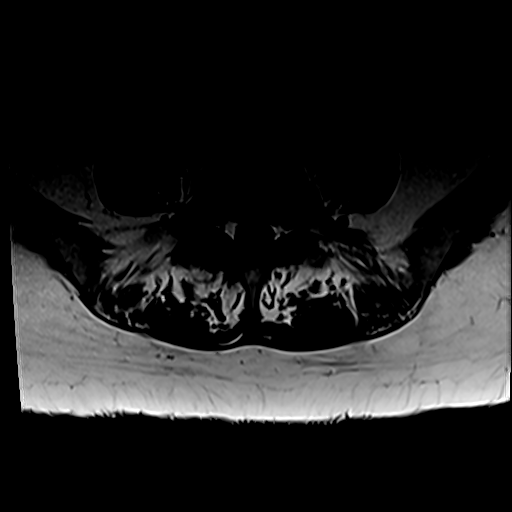
[im 14/40]
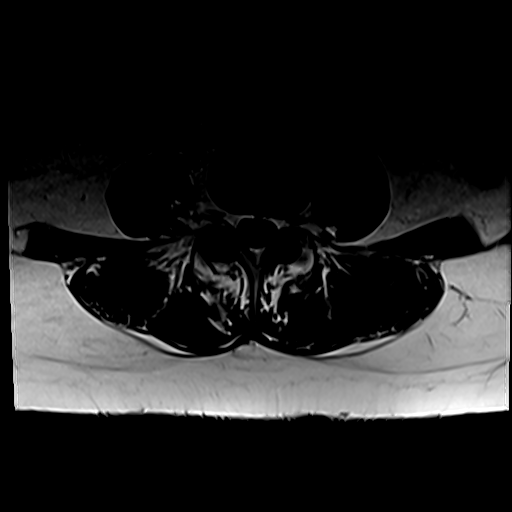
[im 19/40]
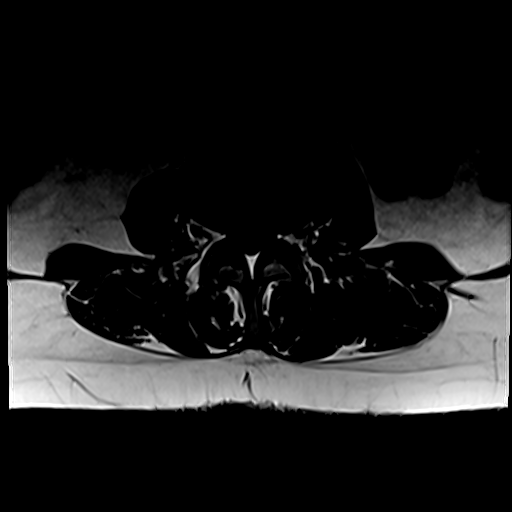
[im 21/40]
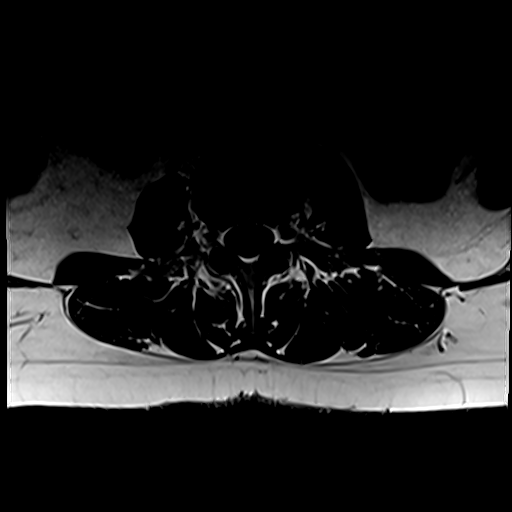
[im 24/40]
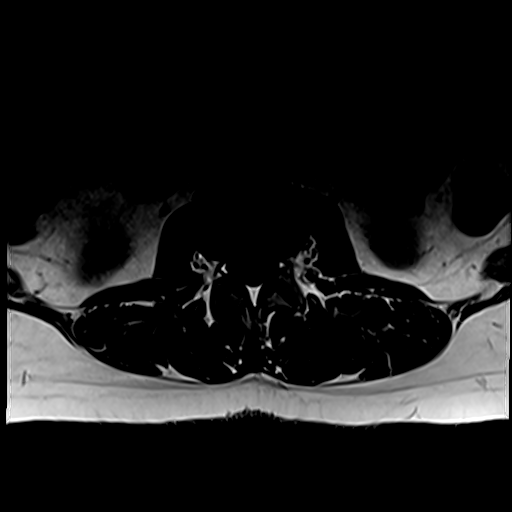
[im 29/40]
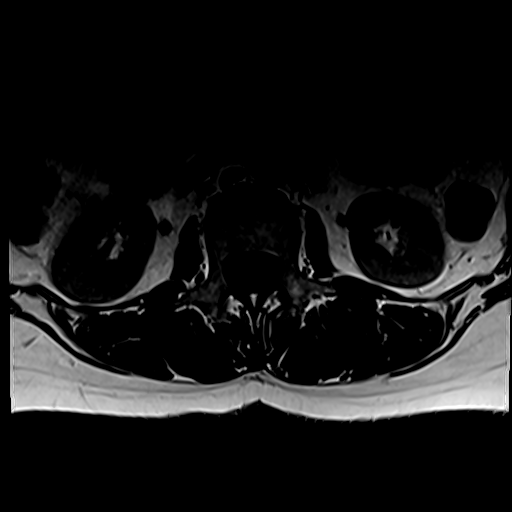
[im 34/40]
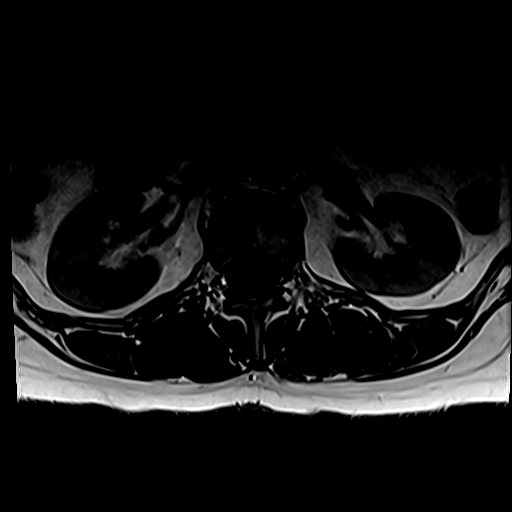
[im 40/40]
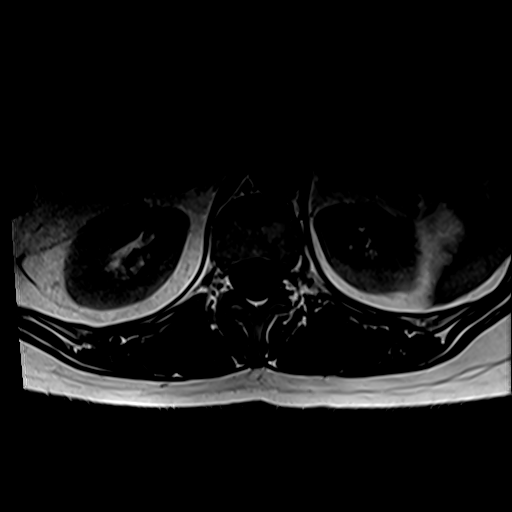

[30 of 48 positions shown; findings below may reference images not displayed]

FINDINGS: Segmentation: The lowest lumbar type non-rib-bearing vertebra is
labeled as L5.

Alignment:  No vertebral subluxation is observed.

Vertebrae: Disc desiccation at L3-4 and L4-5. Small Tarlov cysts at
the S2 level.

Conus medullaris and cauda equina: Conus extends to the L1 level.
Conus and cauda equina appear normal.

Paraspinal and other soft tissues: Unremarkable

Disc levels:

L1-2: Unremarkable.

L2-3: Unremarkable.

L3-4: Mild displacement of the left L3 nerve in the lateral
extraforaminal space with borderline central narrowing of the thecal
sac and borderline right subarticular lateral recess stenosis due to
disc bulge with left paracentral annular tear along with left
foraminal disc protrusion.

L4-5: Moderate central narrowing of the thecal sac with mild right
and borderline left subarticular lateral recess stenosis as well as
mild left and borderline right subarticular lateral recess stenosis
due to disc bulge and degenerative facet arthropathy. Left foraminal
annular tear noted.

L5-S1: Unremarkable.
IMPRESSION: Lumbar spondylosis and degenerative disc disease, causing moderate
impingement at L4-5 and mild impingement at L3-4, as detailed above.

## 2019-07-07 ENCOUNTER — Other Ambulatory Visit: Payer: Self-pay | Admitting: Physical Medicine and Rehabilitation

## 2019-07-07 DIAGNOSIS — M4802 Spinal stenosis, cervical region: Secondary | ICD-10-CM

## 2019-07-23 ENCOUNTER — Other Ambulatory Visit: Payer: Self-pay

## 2019-07-23 ENCOUNTER — Ambulatory Visit (HOSPITAL_COMMUNITY)
Admission: RE | Admit: 2019-07-23 | Discharge: 2019-07-23 | Disposition: A | Discharge status: Home / Self Care | Payer: No Typology Code available for payment source | Source: Ambulatory Visit | Attending: Physical Medicine and Rehabilitation | Admitting: Physical Medicine and Rehabilitation

## 2019-07-23 DIAGNOSIS — M4802 Spinal stenosis, cervical region: Secondary | ICD-10-CM | POA: Insufficient documentation

## 2019-07-23 IMAGING — MR MR CERVICAL SPINE W/O CM
4 of 6 series · 17 of 48 positions shown · non-contrast
Comparison: Prior radiograph from [DATE].

CLINICAL DATA: Initial evaluation for cervical spinal stenosis.

EXAM:
MRI CERVICAL SPINE WITHOUT CONTRAST
TECHNIQUE: Multiplanar, multisequence MR imaging of the cervical spine was
performed. No intravenous contrast was administered.

[Series 2: T2 · sagittal · 3.0mm · 0.39mm/px · 3 of 16 slices shown (1 of 2)]
[im 1/16]
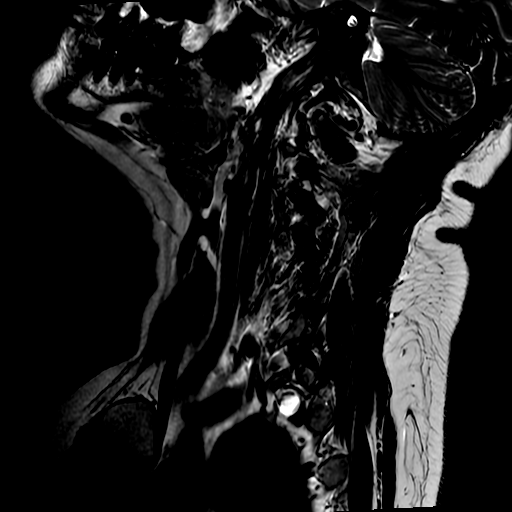
[im 8/16]
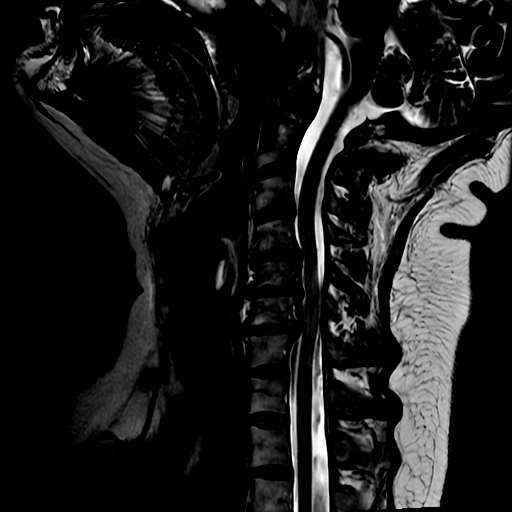
[im 16/16]
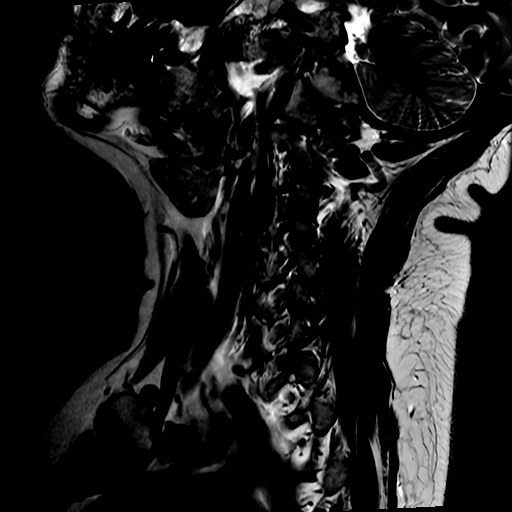

[Series 4: STIR · sagittal · 3.0mm · 0.39mm/px · 3 of 16 slices shown]
[im 1/16]
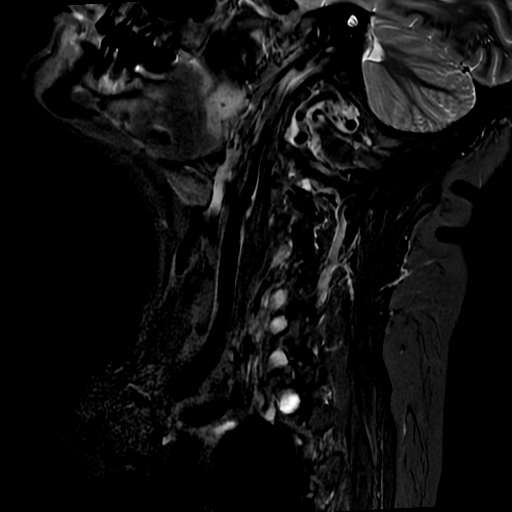
[im 11/16]
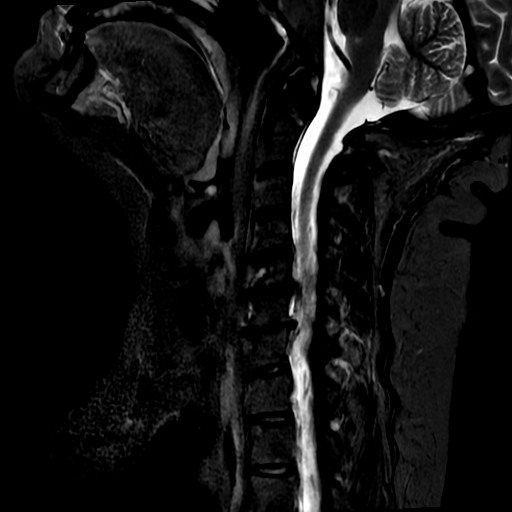
[im 16/16]
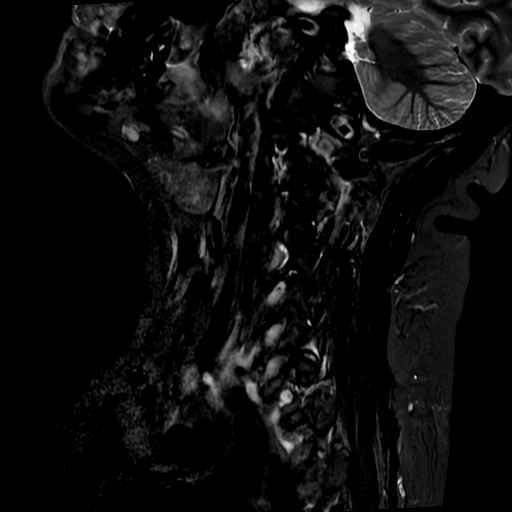

[Series 6: T2 · axial · 3.0mm · 0.35mm/px · z∈[-131,-30]mm · 8 of 32 slices shown (2 of 2)]
[im 1/32]
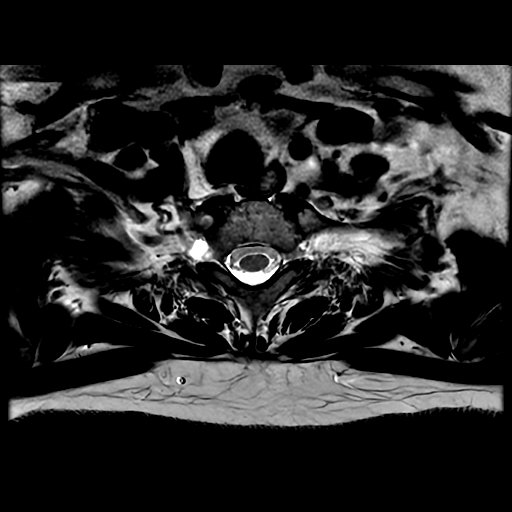
[im 5/32]
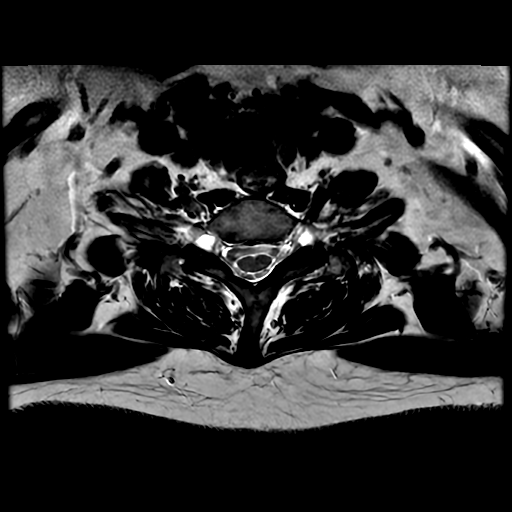
[im 9/32]
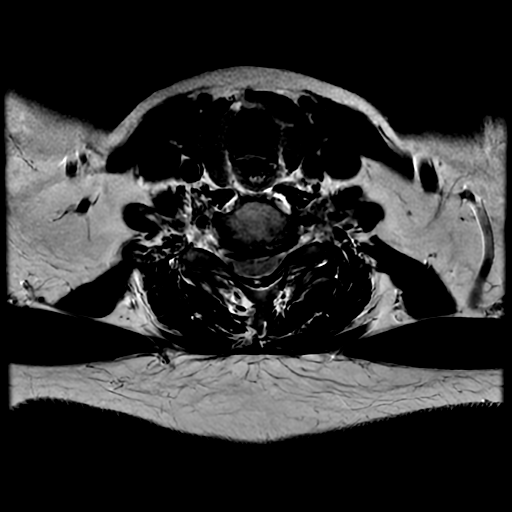
[im 14/32]
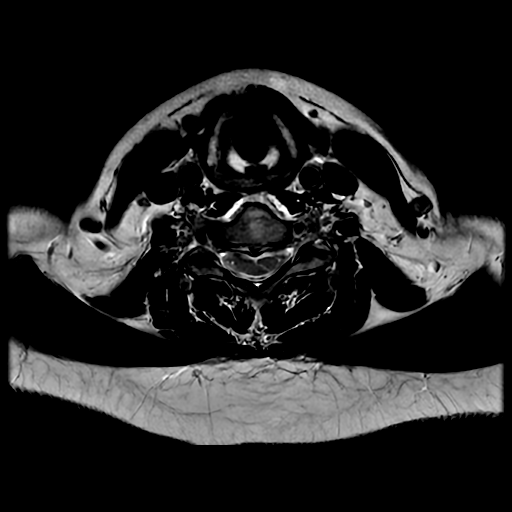
[im 18/32]
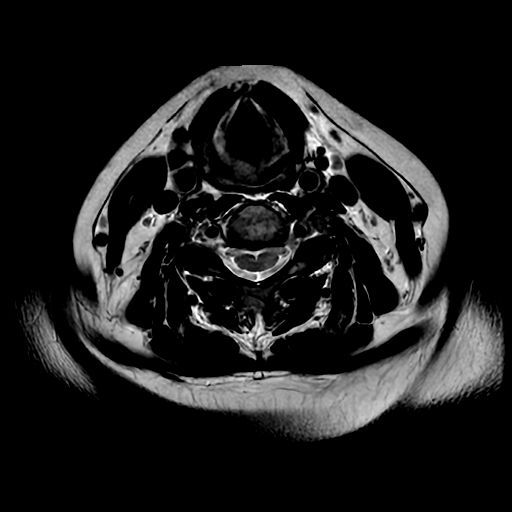
[im 23/32]
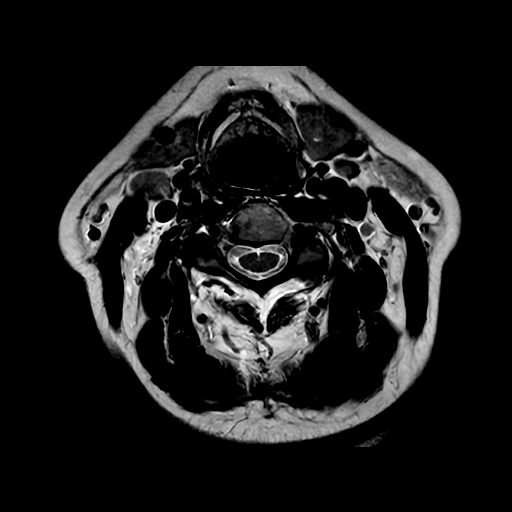
[im 27/32]
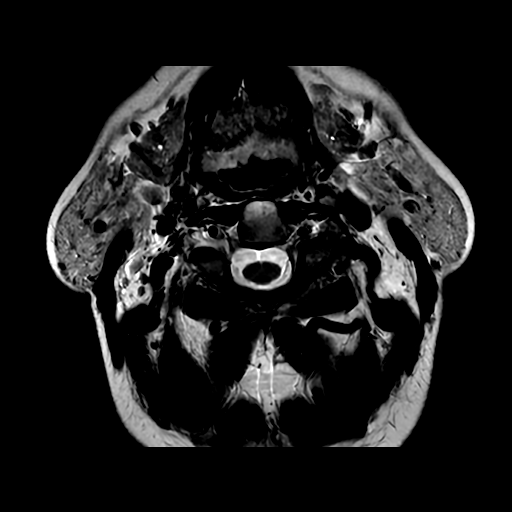
[im 32/32]
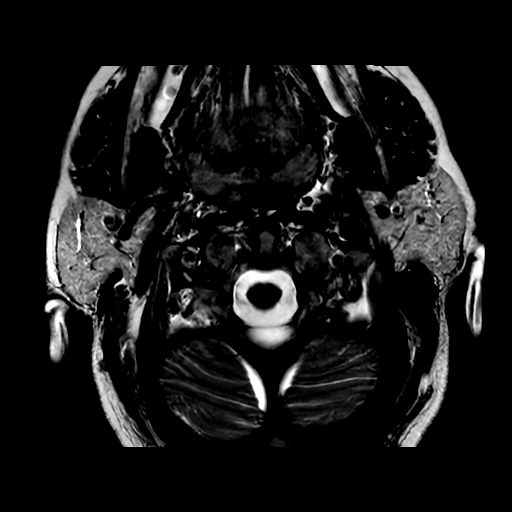

[Series 7: T1 · axial · 3.0mm · 0.35mm/px · z∈[-118,-46]mm · 3 of 32 slices shown]
[im 5/32]
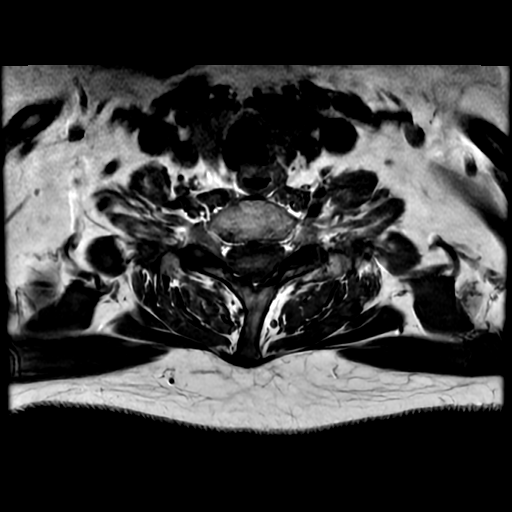
[im 18/32]
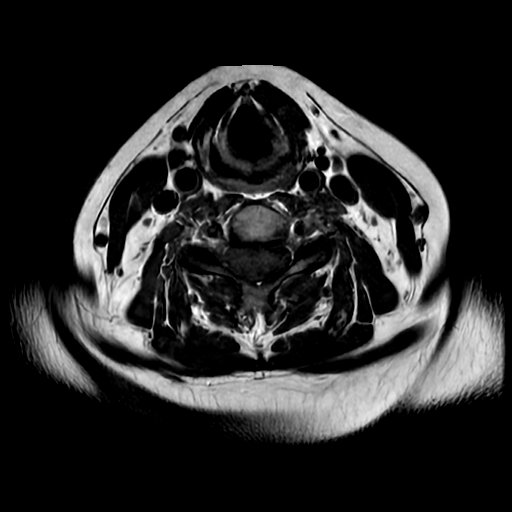
[im 27/32]
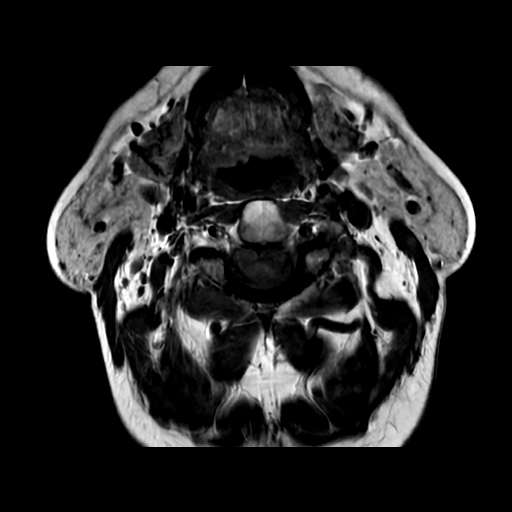

[17 of 48 positions shown; findings below may reference images not displayed]

FINDINGS: Alignment: Straightening with slight reversal of the normal cervical
lordosis. No listhesis.

Vertebrae: Vertebral body height maintained without evidence for
acute or chronic fracture. Bone marrow signal intensity within
normal limits. No discrete or worrisome osseous lesions. No abnormal
marrow edema.

Cord: There is subtle somewhat linear and ill-defined T2 signal
abnormality within the central and dorsal cord at the level of C4-5
and C5-6, suspicious for possible mild chronic myelomalacia (series
2, image 9). Finding somewhat difficult to be certain of as there is
some motion artifact through this region. Signal intensity within
the cervical spinal cord is otherwise within normal limits.

Posterior Fossa, vertebral arteries, paraspinal tissues: Visualized
brain and posterior fossa are unremarkable. Craniocervical junction
within normal limits. Paraspinous and prevertebral soft tissues are
normal. Normal intravascular flow voids seen within the vertebral
arteries bilaterally.

Disc levels:

C2-C3: Normal interspace. Mild facet hypertrophy, greater on the
left. No stenosis.

C3-C4: Small central disc protrusion mildly indents the ventral
thecal sac. No significant stenosis or cord deformity. Foramina
remain patent.

C4-C5: Chronic intervertebral disc space narrowing with diffuse disc
bulge and uncovertebral hypertrophy. Superimposed central disc
protrusion indents the ventral thecal sac, contacting and mildly
flattening the ventral spinal cord. Mild spinal stenosis. Foramina
remain patent.

C5-C6: Chronic intervertebral disc space narrowing with diffuse
degenerative disc osteophyte. Broad central and posterior component
effaces the ventral thecal sac. Superimposed mild facet and ligament
flavum hypertrophy. Resultant moderate spinal stenosis with mild
cord flattening. Moderate right with mild left C6 foraminal
narrowing.

C6-C7: Chronic intervertebral disc space narrowing with diffuse
degenerative disc osteophyte, asymmetric to the right. Mild
ligamentum flavum hypertrophy. Ventral CSF is effaced with mild
flattening of the cervical spinal cord, greater on the right.
Moderate spinal stenosis. Moderate right worse than left C7
foraminal narrowing.

C7-T1: Mild facet hypertrophy. Small perineural cyst noted at the
neural foramina bilaterally. Otherwise unremarkable without
stenosis.

Visualized upper thoracic spine demonstrates no significant finding.
IMPRESSION: 1. Degenerative disc osteophyte at C5-6 and C6-7 with resultant
moderate canal and right worse than left C6 and C7 foraminal
stenosis.
2. Central disc protrusion at C4-5 with resultant mild spinal
stenosis.
3. Question subtle T2 signal abnormality within the central and
dorsal cord at the level of C4-5 and C5-6, suspicious for possible
mild chronic myelomalacia. Finding somewhat difficult to be certain
as there is some motion artifact through this region on this exam.

## 2019-07-25 ENCOUNTER — Other Ambulatory Visit: Payer: Self-pay | Admitting: Physical Medicine and Rehabilitation

## 2019-07-25 DIAGNOSIS — M4802 Spinal stenosis, cervical region: Secondary | ICD-10-CM

## 2019-07-26 ENCOUNTER — Encounter: Payer: Self-pay | Admitting: Physical Medicine & Rehabilitation

## 2019-07-26 ENCOUNTER — Telehealth: Payer: Self-pay | Admitting: Physical Medicine and Rehabilitation

## 2019-07-26 ENCOUNTER — Encounter (HOSPITAL_BASED_OUTPATIENT_CLINIC_OR_DEPARTMENT_OTHER): Payer: No Typology Code available for payment source | Admitting: Physical Medicine & Rehabilitation

## 2019-07-26 ENCOUNTER — Other Ambulatory Visit: Payer: Self-pay

## 2019-07-26 VITALS — BP 121/80 | HR 66 | Temp 97.5°F | Ht 63.5 in | Wt 191.0 lb

## 2019-07-26 DIAGNOSIS — M5116 Intervertebral disc disorders with radiculopathy, lumbar region: Secondary | ICD-10-CM

## 2019-07-26 NOTE — Telephone Encounter (Signed)
[facility] doesn't take her insurance.  It looks like she has the [place] care.  I am not sure where you would like to go from here to get patient scheduled.  Thank you.

## 2019-07-26 NOTE — Telephone Encounter (Signed)
Thank you so much! Do you know of any pain centers that take charity care and perform cervical epidurals?

## 2019-07-26 NOTE — Progress Notes (Signed)
  PROCEDURE RECORD  Physical Medicine and Rehabilitation   Name: Christina Burke DOB:[birth year] MRN: [id number]  Date:07/26/2019  Physician: Alysia Penna, MD    Nurse/CMA: Wessling,CMA  Allergies:  Allergies  Allergen Reactions  . Amoxicillin Rash    Has patient had a PCN reaction causing immediate rash, facial/tongue/throat swelling, SOB or lightheadedness with hypotension: yes Has patient had a PCN reaction causing severe rash involving mucus membranes or skin necrosis: no Has patient had a PCN reaction that required hospitalization: no Has patient had a PCN reaction occurring within the last 10 years: no If all of the above answers are "NO", then may proceed with Cephalosporin use.   Marland Kitchen Penicillins Rash    Consent Signed: Yes.    Is patient diabetic? No.  CBG today?  Pregnant: No. LMP: No LMP recorded. Patient has had a hysterectomy. (age 58-55)  Anticoagulants: no Anti-inflammatory: no Antibiotics: no  Procedure: Right L5 Transforminal epidual steriod  Position: Prone Start Time: 11:35am    End Time: 11:42am  Fluoro Time: 34s  RN/CMA Aneudy Champlain,CMA Wessling, CMA    Time 10:44am     BP 121/88 132/83    Pulse 66 60    Respirations 14 14    O2 Sat 98 98    S/S 6 6    Pain Level 8/10 8/10     D/C home with no one , patient A & O X 3, D/C instructions reviewed, and sits independently.

## 2019-07-26 NOTE — Progress Notes (Signed)
RIght L5-S1 Lumbar transforaminal epidural steroid injection under fluoroscopic guidance with contrast enhancement  Indication: Lumbosacral radiculitis is not relieved by medication management or other conservative care and interfering with self-care and mobility.   Informed consent was obtained after describing risk and benefits of the procedure with the patient, this includes bleeding, bruising, infection, paralysis and medication side effects.  The patient wishes to proceed and has given written consent.  Patient was placed in prone position.  The lumbar area was marked and prepped with Betadine.  It was entered with a 25-gauge 1-1/2 inch needle and one mL of 1% lidocaine was injected into the skin and subcutaneous tissue.  Then a 22-gauge 5" spinal needle was inserted into the RIght L5-S1 intervertebral foramen under AP, lateral, and oblique view.  Once needle tip was within the foramen on lateral views an dnor exceeding 6 o clock position on th epedical on AP viewed Isovue 200 was inected x 43ml Then a solution containing one mL of 10 mg per mL dexamethasone and 2 mL of 1% lidocaine was injected.  The patient tolerated procedure well.  Post procedure instructions were given.  Please see post procedure form.

## 2019-07-26 NOTE — Patient Instructions (Signed)

## 2019-07-27 NOTE — Telephone Encounter (Signed)
I tried the surrounding area with [facility] and [facility], they do not have anyone that does injections for charity care.

## 2019-07-27 NOTE — Telephone Encounter (Signed)
Thank you so much for checking!

## 2019-07-28 ENCOUNTER — Ambulatory Visit: Payer: Medicaid Other | Attending: Internal Medicine

## 2019-07-28 ENCOUNTER — Telehealth: Payer: Self-pay | Admitting: *Deleted

## 2019-07-28 DIAGNOSIS — Z23 Encounter for immunization: Secondary | ICD-10-CM

## 2019-07-28 NOTE — Telephone Encounter (Signed)
Christina Burke called and reports that the shot she received did not help, but she would like to try another if possible. She would also like to talk more about the surgical options. Her next appt is 08/02/19.

## 2019-07-28 NOTE — Progress Notes (Signed)
   Covid-19 Vaccination Clinic  Name:  Christina Burke    MRN: [id number] DOB: 10/22/1960  07/28/2019  Christina Burke was observed post Covid-19 immunization for 15 minutes without incident. She was provided with Vaccine Information Sheet and instruction to access the V-Safe system.   Christina Burke was instructed to call 911 with any severe reactions post vaccine: Marland Kitchen Difficulty breathing  . Swelling of face and throat  . A fast heartbeat  . A bad rash all over body  . Dizziness and weakness   Immunizations Administered    Name Date Dose VIS Date Route   Pfizer COVID-19 Vaccine 07/28/2019  9:34 AM 0.3 mL 04/08/2019 Intramuscular   Manufacturer: [place]   Lot: [id number]   [place]: [id number]

## 2019-07-29 NOTE — Telephone Encounter (Signed)
Thank you for the note Sybil, I hope you are feeling well! Can you please call her and let her know that I am currently on vacation but will discuss other injection and surgical options with her at her appointment on 4/6? Thanks so much!

## 2019-08-01 ENCOUNTER — Ambulatory Visit: Payer: No Typology Code available for payment source | Admitting: Physical Medicine and Rehabilitation

## 2019-08-01 MED FILL — BENAZEPRIL HCL 20 MG TABLET: 20 | 30 days supply | Qty: 30 | Fill #2

## 2019-08-02 ENCOUNTER — Encounter: Payer: Self-pay | Admitting: Physical Medicine and Rehabilitation

## 2019-08-02 ENCOUNTER — Telehealth: Payer: Self-pay | Admitting: Family Medicine

## 2019-08-02 ENCOUNTER — Encounter
Payer: No Typology Code available for payment source | Attending: Physical Medicine and Rehabilitation | Admitting: Physical Medicine and Rehabilitation

## 2019-08-02 ENCOUNTER — Other Ambulatory Visit: Payer: Self-pay

## 2019-08-02 VITALS — BP 122/79 | HR 75 | Temp 97.7°F | Ht 63.5 in | Wt 192.0 lb

## 2019-08-02 DIAGNOSIS — M5116 Intervertebral disc disorders with radiculopathy, lumbar region: Secondary | ICD-10-CM | POA: Insufficient documentation

## 2019-08-02 DIAGNOSIS — G8929 Other chronic pain: Secondary | ICD-10-CM

## 2019-08-02 DIAGNOSIS — M5441 Lumbago with sciatica, right side: Secondary | ICD-10-CM | POA: Insufficient documentation

## 2019-08-02 DIAGNOSIS — M4802 Spinal stenosis, cervical region: Secondary | ICD-10-CM | POA: Insufficient documentation

## 2019-08-02 MED ORDER — GABAPENTIN 300 MG PO CAPS: 300.0000 mg | ORAL_CAPSULE | Freq: Three times a day (TID) | ORAL | 0 refills | Status: DC

## 2019-08-02 MED FILL — GABAPENTIN 300 MG CAPSULE: 300 | 30 days supply | Qty: 90 | Fill #0

## 2019-08-02 NOTE — Telephone Encounter (Signed)
Referral has been placed. 

## 2019-08-02 NOTE — Patient Instructions (Signed)
Recommended procedure: Cervical epidural spinal injection

## 2019-08-02 NOTE — Progress Notes (Signed)
Subjective:    Patient ID: Christina Burke, female    DOB: 1960/07/09, 59 y.o.   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 She has since been retired since 1995. Her pain is worst in her neck and lower back with radiation into her arms and legs.   Her worst pain is in her lumbar spine and she does have pain radiating down her right leg laterally. She had a lumbar XR on 2/10 that was personally reviewed and shows mild degenerative changes without acute abnormalities. The pain is 7/10 and aching and burning in quality. It is severe both in her back and leg at times. She has had epidural spinal injections in the past with good benefit. I ordered an MRI of her lumbar spin which shows moderate impingement at L4-L5 and referred her for an epidural steroid injection with Dr. Letta Pate. Unfortunately she only obtained 1 day of relief from the procedure.   She did PT which mostly consisted of dry needling of the muscles of her upper back for her neck pain and did not help much. She would be interested in another epidural injection if this could help her.   She is currently taking Cymbalta for depression and tolerates this well, but she does not feel that it helps much with her pain. She also takes Methocarbamol and she also does not feel that this helps much.   Pain Inventory Average Pain 8 Pain Right Now 8 My pain is constant and aching  In the last 24 hours, has pain interfered with the following? General activity 7 Relation with others 7 Enjoyment of life 7 What TIME of day is your pain at its worst? evening Sleep (in general) Poor  Pain is worse with: walking, bending, sitting, inactivity, standing and some activites Pain improves with: rest Relief from Meds: 0  Mobility walk without assistance ability to climb steps?   yes do you drive?  yes Do you have any goals in this area?  yes  Function not employed: date last employed . I need assistance with the following:  household duties Do you have any goals in this area?  yes  Neuro/Psych weakness numbness trouble walking anxiety  Prior Studies Any changes since last visit?  no  Physicians involved in your care Any changes since last visit?  no   Family History  Problem Relation Age of Onset  . Gallbladder disease Mother   . Heart disease Father   . Colon polyps Sister   . Diabetes Sister        x3  . Diabetes Brother   . Breast cancer Paternal Aunt   . Colon cancer Neg Hx   . Kidney disease Neg Hx   . Esophageal cancer Neg Hx    Social History   Socioeconomic History  . Marital status: Legally Separated    Spouse name: Not on file  . Number of children: 2  . Years of education: Not on file  . Highest education level: Not on file  Occupational History  . Occupation: Unemployed  Tobacco Use  . Smoking status: Never Smoker  . Smokeless tobacco: Never Used  Substance and Sexual Activity  . Alcohol use: Yes    Alcohol/week: 1.0 - 2.0 standard drinks    Types: 1 - 2 Cans of beer per  week    Comment: a night  . Drug use: No  . Sexual activity: Yes    Birth control/protection: None  Other Topics Concern  . Not on file  Social History Narrative  . Not on file   Social Determinants of Health   Financial Resource Strain:   . Difficulty of Paying Living Expenses:   Food Insecurity:   . Worried About Charity fundraiser in the Last Year:   . Arboriculturist in the Last Year:   Transportation Needs:   . Film/video editor (Medical):   Marland Kitchen Lack of Transportation (Non-Medical):   Physical Activity:   . Days of Exercise per Week:   . Minutes of Exercise per Session:   Stress:   . Feeling of Stress :   Social Connections:   . Frequency of Communication with Friends and Family:   . Frequency of Social Gatherings with Friends  and Family:   . Attends Religious Services:   . Active Member of Clubs or Organizations:   . Attends Archivist Meetings:   Marland Kitchen Marital Status:    Past Surgical History:  Procedure Laterality Date  . ABDOMINAL HYSTERECTOMY    . ABDOMINAL SURGERY     Past Medical History:  Diagnosis Date  . Anxiety   . Cancer ([place])    pt. reports had uterine cancer x 10 years ago  . Chest pain 12/08/2013  . GERD (gastroesophageal reflux disease) 08/10/2014  . Hyperlipidemia   . Hypertension   . Murmur 12/08/2013   BP 122/79   Pulse 75   Temp 97.7 F (36.5 C)   Ht 5' 3.5" (1.613 m)   Wt 192 lb (87.1 kg)   SpO2 97%   BMI 33.48 kg/m   Opioid Risk Score:   Fall Risk Score:  `1  Depression screen PHQ 2/9  Depression screen [facility] 2/9 06/28/2019 05/04/2019 11/23/2018 06/22/2018 03/23/2018 01/29/2018 01/25/2018  Decreased Interest 0 1 1 1 1 1  0  Down, Depressed, Hopeless 0 0 1 1 1  0 1  PHQ - 2 Score 0 1 2 2 2 1 1   Altered sleeping 3 2 2 1  0 1 1  Tired, decreased energy 0 1 0 1 1 1 1   Change in appetite 0 1 2 1  0 1 0  Feeling bad or failure about yourself  0 0 0 0 0 0 0  Trouble concentrating 0 0 0 0 0 0 1  Moving slowly or fidgety/restless 0 0 0 0 0 0 0  Suicidal thoughts 0 0 0 0 0 0 0  PHQ-9 Score 3 5 6 5 3 4 4   Difficult doing work/chores Somewhat difficult - - - - - -  Some recent data might be hidden     Review of Systems  Constitutional: Negative.   HENT: Negative.   Eyes: Negative.   Respiratory: Negative.   Cardiovascular: Negative.   Gastrointestinal: Negative.   Endocrine: Negative.   Genitourinary: Negative.   Musculoskeletal: Positive for arthralgias, back pain, gait problem and neck pain.  Skin: Negative.   Allergic/Immunologic: Negative.   Neurological: Positive for numbness.  Hematological: Negative.   Psychiatric/Behavioral: The patient is nervous/anxious.   All other systems reviewed and are negative.      Objective:   Physical Exam Gen: no distress, normal  appearing HEENT: oral mucosa pink and moist, NCAT Cardio: Reg rate Chest: normal effort, normal rate of breathing Abd: soft, non-distended Ext: no edema Skin: intact Neuro: AOx3.  Musculoskeletal:  Lumbar spine: 4/5 strength in right side as compared to left side.+ slump test on left side. Sensation is intact throughout.  Cervical spine: 4/5 strength throughout right side as compared to left side. Pain is worse with extension.  Psych: pleasant, normal affect     Assessment & Plan:  Mrs. 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   1) Right sided Radiculopathy, with possible compression of L5 nerve root  -Lumbar XR and MRIs were previously personally reviewed and discussed with patient. Unfortunately she did not get good response with lumbar epidural injection.   -Previously increased dose of Cymbalta to 60mg  BID for better pain control. This has not provided better pain control and I advised that she decrease dose back to 60mg  daily.   2) Cervical stenosis -MRI cervical spine personally reviewed with patient and shows moderate canal stenosis right > left at C6 and C7 with foraminal stenosis, central disc protrusion at C4-5 with resultant mild spinal stenosis, and subtle T2 signal abnormality within the central and dorsal cord at the level of C4-5 and C5-6, suspicious for possible mild chronic myelomalacia. Finding somewhat difficult to be certain as there is some motion artifact through this region on this exam. --Provided with referral for cervical epidural injection. --Tried to provide referral for NSGY but difficult to find practice willing to accept [facility].   All questions answered. RTC in 1 month.

## 2019-08-02 NOTE — Telephone Encounter (Signed)
Please place order for ortho care

## 2019-08-08 ENCOUNTER — Ambulatory Visit: Payer: No Typology Code available for payment source | Admitting: Orthopedic Surgery

## 2019-08-08 ENCOUNTER — Other Ambulatory Visit: Payer: Self-pay

## 2019-08-09 ENCOUNTER — Encounter: Payer: Self-pay | Admitting: Orthopaedic Surgery

## 2019-08-09 ENCOUNTER — Ambulatory Visit (INDEPENDENT_AMBULATORY_CARE_PROVIDER_SITE_OTHER): Payer: Self-pay | Admitting: Orthopaedic Surgery

## 2019-08-09 DIAGNOSIS — M4722 Other spondylosis with radiculopathy, cervical region: Secondary | ICD-10-CM

## 2019-08-09 NOTE — Progress Notes (Signed)
Office Visit Note   Patient: Christina Burke           Date of Birth: [birth year]           MRN: [id number] Visit Date: 08/09/2019              Requested by: Charlott Rakes, MD [place],  [place] [id number] PCP: Charlott Rakes, MD   Assessment & Plan: Visit Diagnoses:  1. Other spondylosis with radiculopathy, cervical region     Plan: Patient cervical spondylosis.  I reviewed the MRI scan with her. Questionable area of myelomalacia.  She does have moderate central narrowing but no severe narrowing.  I do not think she needs to have her scan repeated at this time.  She gets some improvement with cervical distraction and will set up with home cervical traction she can use a few times a day.  She takes care of her son who lives with her.  We discussed if surgery is considered at multilevel cervical fusion done anteriorly either 2 or 3 level.  She need to be immobilized for 6 weeks after the surgery.  We discussed risks of pseudoarthrosis reoperation dysphagia dysphonia.  We will have her use cervical traction she can use some Tylenol intermittently and continue the current medications that she has been taking.  She is not on any narcotic medication.  Home cervical traction set up given and appropriate use discussed.  We will check her back again in a month to follow her progress.  We discussed cervical spondylosis and cervical disc disease.  We reviewed the radiologist interpretation of the report as well which she is able to access on MyChart.  We also reviewed her lumbar MRI scan report.  Recheck 1 month.  Follow-Up Instructions: Return in about 1 month (around 09/08/2019).   Orders:  No orders of the defined types were placed in this encounter.  No orders of the defined types were placed in this encounter.     Procedures: No procedures performed   Clinical Data: No additional findings.   Subjective: Chief Complaint  Patient presents with  . Neck - Pain  .  Lower Back - Pain    HPI 59 year old female seen with problems with neck pain worse than back pain.  Patient is on the [employer] and has been treated by Dr. Elnita Maxwell for managing her chronic back and neck pain.  He had tried to send her to [facility] and they could not see her due to her insurance or lack of insurance.  [facility] was then [facility] and referred here.  Patient has been treated with Mobic, Robaxin, Cymbalta, Klonopin and Neurontin.  She has persistent neck and right shoulder pain pain related does not radiate down to her hand she denies any numbness or tingling in her hand during the day but states sometimes after waking up she has had numbness in her hand.  She is able to reach arm up overhead to get dressed to wash her hair and does not think that the pain actually gets worse with shoulder range of motion.  She does note increased pain with neck range of motion.  Cervical MRI showed disc osteophyte complex C5-6 C6-7 spondylosis and moderate canal stenosis and more narrowing with left C6 and C7 foraminal stenosis.  There is question of subtle T2 abnormality of the cord difficulty with some motion artifact, this was at the C4-5 and C5-6 level.  Patient denies any  gait disturbance.  She states she is had persistent problems with some chronic low back pain and MRI scan 07/07/2019 showed some lumbar spondylosis with moderate impingement at L4-5 and mild at L3-4.  Review of Systems patient does not work she takes care of a son who has mental impairment problems.  Review of systems positive for anxiety chest pain GERD hypertension hyperlipidemia and chronic neck and back pain.  Otherwise negative is obtains HPI.   Objective: Vital Signs: BP 127/80   Pulse 61   Ht 5' 3.5" (1.613 m)   Wt 191 lb (86.6 kg)   BMI 33.30 kg/m   Physical Exam Constitutional:      Appearance: She is well-developed.  HENT:     Head: Normocephalic.     Right Ear: External ear  normal.     Left Ear: External ear normal.  Eyes:     Pupils: Pupils are equal, round, and reactive to light.  Neck:     Thyroid: No thyromegaly.     Trachea: No tracheal deviation.  Cardiovascular:     Rate and Rhythm: Normal rate.  Pulmonary:     Effort: Pulmonary effort is normal.  Abdominal:     Palpations: Abdomen is soft.  Skin:    General: Skin is warm and dry.  Neurological:     Mental Status: She is alert and oriented to person, place, and time.  Psychiatric:        Behavior: Behavior normal.     Ortho Exam patient has some brachial plexus tenderness right and left positive Spurling on the right negative on the left.  She gets some improvement with cervical distraction no increased pain with cervical compression.  Upper extremity reflexes are 2+ and symmetrical no biceps triceps brachial radialis wrist flexion extension weakness.  No lower extremity hyperreflexia no clonus.  Specialty Comments:  No specialty comments available.  Imaging: CLINICAL DATA:  Initial evaluation for cervical spinal stenosis.  EXAM: MRI CERVICAL SPINE WITHOUT CONTRAST  TECHNIQUE: Multiplanar, multisequence MR imaging of the cervical spine was performed. No intravenous contrast was administered.  COMPARISON:  Prior radiograph from 06/08/2019.  FINDINGS: Alignment: Straightening with slight reversal of the normal cervical lordosis. No listhesis.  Vertebrae: Vertebral body height maintained without evidence for acute or chronic fracture. Bone marrow signal intensity within normal limits. No discrete or worrisome osseous lesions. No abnormal marrow edema.  Cord: There is subtle somewhat linear and ill-defined T2 signal abnormality within the central and dorsal cord at the level of C4-5 and C5-6, suspicious for possible mild chronic myelomalacia (series 2, image 9). Finding somewhat difficult to be certain of as there is some motion artifact through this region. Signal intensity  within the cervical spinal cord is otherwise within normal limits.  Posterior Fossa, vertebral arteries, paraspinal tissues: Visualized brain and posterior fossa are unremarkable. Craniocervical junction within normal limits. Paraspinous and prevertebral soft tissues are normal. Normal intravascular flow voids seen within the vertebral arteries bilaterally.  Disc levels:  C2-C3: Normal interspace. Mild facet hypertrophy, greater on the left. No stenosis.  C3-C4: Small central disc protrusion mildly indents the ventral thecal sac. No significant stenosis or cord deformity. Foramina remain patent.  C4-C5: Chronic intervertebral disc space narrowing with diffuse disc bulge and uncovertebral hypertrophy. Superimposed central disc protrusion indents the ventral thecal sac, contacting and mildly flattening the ventral spinal cord. Mild spinal stenosis. Foramina remain patent.  C5-C6: Chronic intervertebral disc space narrowing with diffuse degenerative disc osteophyte. Broad central and posterior component effaces  the ventral thecal sac. Superimposed mild facet and ligament flavum hypertrophy. Resultant moderate spinal stenosis with mild cord flattening. Moderate right with mild left C6 foraminal narrowing.  C6-C7: Chronic intervertebral disc space narrowing with diffuse degenerative disc osteophyte, asymmetric to the right. Mild ligamentum flavum hypertrophy. Ventral CSF is effaced with mild flattening of the cervical spinal cord, greater on the right. Moderate spinal stenosis. Moderate right worse than left C7 foraminal narrowing.  C7-T1: Mild facet hypertrophy. Small perineural cyst noted at the neural foramina bilaterally. Otherwise unremarkable without stenosis.  Visualized upper thoracic spine demonstrates no significant finding.  IMPRESSION: 1. Degenerative disc osteophyte at C5-6 and C6-7 with resultant moderate canal and right worse than left C6 and C7  foraminal stenosis. 2. Central disc protrusion at C4-5 with resultant mild spinal stenosis. 3. Question subtle T2 signal abnormality within the central and dorsal cord at the level of C4-5 and C5-6, suspicious for possible mild chronic myelomalacia. Finding somewhat difficult to be certain as there is some motion artifact through this region on this exam.   Electronically Signed   By: Jeannine Boga M.D.   On: 07/24/2019 19:00    PMFS History: Patient Active Problem List   Diagnosis Date Noted  . 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 Vitamin D deficiency 03/23/2018  . Hypertension   . Anxiety   . Hyperlipidemia   . GERD (gastroesophageal reflux disease) 08/10/2014  . HTN (hypertension) 01/25/2014  . Chest pain 12/08/2013  . Murmur 12/08/2013   Past Medical History:  Diagnosis Date  . Anxiety   . Cancer ([place])    pt. reports had uterine cancer x 10 years ago  . Chest pain 12/08/2013  . GERD (gastroesophageal reflux disease) 08/10/2014  . Hyperlipidemia   . Hypertension   . Murmur 12/08/2013    Family History  Problem Relation Age of Onset  . Gallbladder disease Mother   . Heart disease Father   . Colon polyps Sister   . Diabetes Sister        x3  . Diabetes Brother   . Breast cancer Paternal Aunt   . Colon cancer Neg Hx   . Kidney disease Neg Hx   . Esophageal cancer Neg Hx     Past Surgical History:  Procedure Laterality Date  . ABDOMINAL HYSTERECTOMY    . ABDOMINAL SURGERY     Social History   Occupational History  . Occupation: Unemployed  Tobacco Use  . Smoking status: Never Smoker  . Smokeless tobacco: Never Used  Substance and Sexual Activity  . Alcohol use: Yes    Alcohol/week: 1.0 - 2.0 standard drinks    Types: 1 - 2 Cans of beer per week    Comment: a night  . Drug use: No  . Sexual activity: Yes    Birth control/protection: None

## 2019-08-22 ENCOUNTER — Ambulatory Visit: Payer: Medicaid Other | Attending: Internal Medicine

## 2019-08-22 DIAGNOSIS — Z23 Encounter for immunization: Secondary | ICD-10-CM

## 2019-08-22 NOTE — Progress Notes (Signed)
   Covid-19 Vaccination Clinic  Name:  Christina Burke    MRN: [id number] DOB: [birth year]  08/22/2019  [name] was observed post Covid-19 immunization for 30 minutes based on pre-vaccination screening without incident. She was provided with Vaccine Information Sheet and instruction to access the V-Safe system.   [name] was instructed to call 911 with any severe reactions post vaccine: Marland Kitchen Difficulty breathing  . Swelling of face and throat  . A fast heartbeat  . A bad rash all over body  . Dizziness and weakness   Immunizations Administered    Name Date Dose VIS Date Route   Pfizer COVID-19 Vaccine 08/22/2019  9:52 AM 0.3 mL 06/22/2018 Intramuscular   Manufacturer: [place]   Lot: [id number]   [place]ender: [id number]

## 2019-08-29 MED FILL — BENAZEPRIL HCL 20 MG TABLET: 20 | 30 days supply | Qty: 30 | Fill #3

## 2019-09-05 ENCOUNTER — Ambulatory Visit: Payer: No Typology Code available for payment source | Admitting: Physical Medicine and Rehabilitation

## 2019-09-07 ENCOUNTER — Ambulatory Visit: Payer: No Typology Code available for payment source | Admitting: Orthopaedic Surgery

## 2019-09-12 ENCOUNTER — Other Ambulatory Visit: Payer: Self-pay

## 2019-09-12 ENCOUNTER — Other Ambulatory Visit: Payer: Self-pay | Admitting: Podiatry

## 2019-09-12 ENCOUNTER — Encounter: Payer: Self-pay | Admitting: Podiatry

## 2019-09-12 ENCOUNTER — Ambulatory Visit (INDEPENDENT_AMBULATORY_CARE_PROVIDER_SITE_OTHER): Payer: No Typology Code available for payment source

## 2019-09-12 ENCOUNTER — Ambulatory Visit (INDEPENDENT_AMBULATORY_CARE_PROVIDER_SITE_OTHER): Payer: No Typology Code available for payment source | Admitting: Podiatry

## 2019-09-12 VITALS — Temp 97.7°F

## 2019-09-12 DIAGNOSIS — M79672 Pain in left foot: Secondary | ICD-10-CM

## 2019-09-12 DIAGNOSIS — M7662 Achilles tendinitis, left leg: Secondary | ICD-10-CM

## 2019-09-12 NOTE — Patient Instructions (Signed)

## 2019-09-12 NOTE — Progress Notes (Signed)
Subjective:   Patient ID: Christina Burke, female   DOB: 59 y.o.   MRN: [id number]   HPI Patient states she was having pain in the back of her left foot but it is gone away   ROS      Objective:  Physical Exam  Neurovascular status intact no pain left      Assessment:  Inflammation left is now better     Plan:  Discharged and less pain occurs x-rays negative for signs of change

## 2019-09-13 ENCOUNTER — Encounter
Payer: No Typology Code available for payment source | Attending: Physical Medicine and Rehabilitation | Admitting: Physical Medicine and Rehabilitation

## 2019-09-13 ENCOUNTER — Other Ambulatory Visit: Payer: Self-pay

## 2019-09-13 ENCOUNTER — Encounter: Payer: Self-pay | Admitting: Physical Medicine and Rehabilitation

## 2019-09-13 VITALS — BP 148/92 | HR 90 | Temp 97.2°F | Ht 63.0 in | Wt 198.0 lb

## 2019-09-13 DIAGNOSIS — M4802 Spinal stenosis, cervical region: Secondary | ICD-10-CM | POA: Insufficient documentation

## 2019-09-13 DIAGNOSIS — M5441 Lumbago with sciatica, right side: Secondary | ICD-10-CM | POA: Insufficient documentation

## 2019-09-13 DIAGNOSIS — G8929 Other chronic pain: Secondary | ICD-10-CM | POA: Insufficient documentation

## 2019-09-13 DIAGNOSIS — M5116 Intervertebral disc disorders with radiculopathy, lumbar region: Secondary | ICD-10-CM | POA: Insufficient documentation

## 2019-09-13 DIAGNOSIS — G4701 Insomnia due to medical condition: Secondary | ICD-10-CM

## 2019-09-13 MED ORDER — TIZANIDINE HCL 2 MG PO CAPS: 2.0000 mg | ORAL_CAPSULE | Freq: Every evening | ORAL | 0 refills | Status: DC | PRN

## 2019-09-13 MED FILL — ?tiZANidine HCL 2 MG tABS: 2 | 30 days supply | Qty: 30 | Fill #0

## 2019-09-13 NOTE — Progress Notes (Signed)
Subjective:    Patient ID: Christina Burke, female    DOB: 1960-12-27, 59 y.o.   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 Her pain is worst in her neck and lower back with radiation into her arms and legs.   Her worst pain is in her lumbar spine and she does have pain radiating down her right leg laterally. She had a lumbar XR on 2/10 that was personally reviewed and shows mild degenerative changes without acute abnormalities. The pain is 7/10 and aching and burning in quality. It is severe both in her back and leg at times. She has had epidural spinal injections in the past with good benefit. I ordered an MRI of her lumbar spine. which shows moderate impingement at L4-L5 and referred her for an epidural steroid injection with Christina Burke. Unfortunately she only obtained 1 day of relief from the procedure.   She did PT which mostly consisted of dry needling of the muscles of her upper back for her neck pain and did not help much. She would be interested in another epidural injection if this could help her.   She is currently taking Cymbalta for depression and tolerates this well, but she does not feel that it helps much with her pain. She also takes Methocarbamol and she also does not feel that this helps much.   Previously I provided referral to [place] and she saw Dr. Lorin Burke who advised her that she could not be a good surgical candidate as she is the primary caretaker of her son with special needs. She has a follow-up appointment with him tomorrow. At this time she does not want to consider cervical epidural due to fear of injection--she had a very painful experience in the past although it was efficacious.   Pain Inventory Average Pain 8 Pain Right Now 8 My pain is constant  In the last  24 hours, has pain interfered with the following? General activity 7 Relation with others 7 Enjoyment of life 7 What TIME of day is your pain at its worst? all Sleep (in general) NA  Pain is worse with: walking, bending, sitting and standing Pain improves with: rest and massage Relief from Meds: 0  Mobility walk without assistance how many minutes can you walk? 3 ability to climb steps?  yes do you drive?  yes  Function not employed: date last employed 1996 I need assistance with the following:  household duties and shopping  Neuro/Psych weakness numbness anxiety  Prior Studies Any changes since last visit?  no  Physicians involved in your care Any changes since last visit?  no   Family History  Problem Relation Age of Onset  . Gallbladder disease Mother   . Heart disease Father   . Colon polyps Sister   . Diabetes Sister        x3  . Diabetes Brother   . Breast cancer Paternal Aunt   . Colon cancer Neg Hx   . Kidney disease Neg Hx   . Esophageal cancer Neg Hx    Social History   Socioeconomic History  . Marital status: Legally Separated    Spouse name: Not on file  . Number of children: 2  . Years of education: Not on file  .  Highest education level: Not on file  Occupational History  . Occupation: Unemployed  Tobacco Use  . Smoking status: Never Smoker  . Smokeless tobacco: Never Used  Substance and Sexual Activity  . Alcohol use: Yes    Alcohol/week: 1.0 - 2.0 standard drinks    Types: 1 - 2 Cans of beer per week    Comment: a night  . Drug use: No  . Sexual activity: Yes    Birth control/protection: None  Other Topics Concern  . Not on file  Social History Narrative  . Not on file   Social Determinants of Health   Financial Resource Strain:   . Difficulty of Paying Living Expenses:   Food Insecurity:   . Worried About Charity fundraiser in the Last Year:   . Arboriculturist in the Last Year:   Transportation Needs:   . Lexicographer (Medical):   Marland Kitchen Lack of Transportation (Non-Medical):   Physical Activity:   . Days of Exercise per Week:   . Minutes of Exercise per Session:   Stress:   . Feeling of Stress :   Social Connections:   . Frequency of Communication with Friends and Family:   . Frequency of Social Gatherings with Friends and Family:   . Attends Religious Services:   . Active Member of Clubs or Organizations:   . Attends Archivist Meetings:   Marland Kitchen Marital Status:    Past Surgical History:  Procedure Laterality Date  . ABDOMINAL HYSTERECTOMY    . ABDOMINAL SURGERY     Past Medical History:  Diagnosis Date  . Anxiety   . Cancer ([place])    pt. reports had uterine cancer x 10 years ago  . Chest pain 12/08/2013  . GERD (gastroesophageal reflux disease) 08/10/2014  . Hyperlipidemia   . Hypertension   . Murmur 12/08/2013   BP (!) 148/92   Pulse 90   Temp (!) 97.2 F (36.2 C)   Ht 5\' 3"  (1.6 m)   Wt 198 lb (89.8 kg)   SpO2 98%   BMI 35.07 kg/m   Opioid Risk Score:   Fall Risk Score:  `1  Depression screen PHQ 2/9  Depression screen [facility] 2/9 09/13/2019 06/28/2019 05/04/2019 11/23/2018 06/22/2018 03/23/2018 01/29/2018  Decreased Interest 1 0 1 1 1 1 1   Down, Depressed, Hopeless 1 0 0 1 1 1  0  PHQ - 2 Score 2 0 1 2 2 2 1   Altered sleeping - 3 2 2 1  0 1  Tired, decreased energy - 0 1 0 1 1 1   Change in appetite - 0 1 2 1  0 1  Feeling bad or failure about yourself  - 0 0 0 0 0 0  Trouble concentrating - 0 0 0 0 0 0  Moving slowly or fidgety/restless - 0 0 0 0 0 0  Suicidal thoughts - 0 0 0 0 0 0  PHQ-9 Score - 3 5 6 5 3 4   Difficult doing work/chores - Somewhat difficult - - - - -  Some recent data might be hidden    Review of Systems  Constitutional: Positive for unexpected weight change.  HENT: Negative.   Eyes: Negative.   Respiratory: Positive for shortness of breath.   Cardiovascular: Negative.   Gastrointestinal: Negative.   Endocrine: Negative.   Genitourinary:  Negative.   Musculoskeletal: Negative.   Skin: Negative.   Allergic/Immunologic: Negative.   Neurological: Positive for weakness and numbness.  Hematological: Negative.  Psychiatric/Behavioral: The patient is nervous/anxious.   All other systems reviewed and are negative.      Objective:   Physical Exam Gen: no distress, normal appearing HEENT: oral mucosa pink and moist, NCAT Cardio: Reg rate Chest: normal effort, normal rate of breathing Abd: soft, non-distended Ext: no edema Skin: intact Neuro: AOx3.  Musculoskeletal:  Lumbar spine: 4/5 strength in right side as compared to left side.+ slump test on left side. Sensation is intact throughout.  Cervical spine: 4/5 strength throughout right side as compared to left side. Pain is worse with extension.  Very tight cervical paraspinals and trapezius muscles bilaterally.  Psych: pleasant, normal affect    Assessment & Plan:  Mrs. 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   1) Right sided Radiculopathy, with possible compression of L5 nerve root  -Lumbar XR and MRIs were previously personally reviewed and discussed with patient. Unfortunately she did not get good response with lumbar epidural injection.   -Previously increased dose of Cymbalta to 60mg  BID for better pain control. This has not provided better pain control and I advised that she decrease dose back to 60mg  daily.   2) Cervical stenosis -MRI cervical spine personally reviewed with patient and shows moderate canal stenosis right > left at C6 and C7 with foraminal stenosis, central disc protrusion at C4-5 with resultant mild spinal stenosis, and subtle T2 signal abnormality within the central and dorsal cord at the level of C4-5 and C5-6, suspicious for possible mild chronic myelomalacia. Finding somewhat difficult to be  certain as there is some motion artifact through this region on this exam. --Provided with referral for cervical epidural injection. She is still considering this option --Deemed a poor surgical candidate by Dr. Lorin Burke since she is the primary caregiver for her son with special needs. She has surgical follow-up with him tomorrow. -Will prescribe Tizanidine 4mg  for use for tight cervical paraspinals and trapezius muscles which is a large source of pain right now. Will also help with insomnia.   All questions answered. RTC in 1 month.

## 2019-09-14 ENCOUNTER — Encounter: Payer: Self-pay | Admitting: Physical Medicine and Rehabilitation

## 2019-09-14 ENCOUNTER — Ambulatory Visit (INDEPENDENT_AMBULATORY_CARE_PROVIDER_SITE_OTHER): Payer: Self-pay | Admitting: Orthopaedic Surgery

## 2019-09-14 ENCOUNTER — Encounter: Payer: Self-pay | Admitting: Orthopaedic Surgery

## 2019-09-14 VITALS — Ht 63.5 in | Wt 198.0 lb

## 2019-09-14 DIAGNOSIS — M4722 Other spondylosis with radiculopathy, cervical region: Secondary | ICD-10-CM

## 2019-09-16 NOTE — Progress Notes (Signed)
Office Visit Note   Patient: Christina Burke           Date of Birth: June 14, 1960           MRN: [id number] Visit Date: 09/14/2019              Requested by: Charlott Rakes, MD [place],   [id number] PCP: Charlott Rakes, MD   Assessment & Plan: Visit Diagnoses:  1. Other spondylosis with radiculopathy, cervical region     Plan: We reviewed the MRI scan discussed pathophysiology.  She has no evidence of myelopathy on exam.  There is questionable areas on the cord but no definite myelomalacia present.  We will check her back again in a month.  Follow-Up Instructions: Return in about 1 month (around 10/15/2019).   Orders:  No orders of the defined types were placed in this encounter.  No orders of the defined types were placed in this encounter.     Procedures: No procedures performed   Clinical Data: No additional findings.   Subjective: Chief Complaint  Patient presents with  . Neck - Pain, Follow-up    HPI 59 year old female returns with ongoing problems with cervical spondylosis.  She has been using home cervical traction but did not feel like it gave her any relief.  She was started on some Zanaflex and states cervical epidural was discussed but she thinks she wants to think about it.  She has neck pain worse than back pain.  Cervical MRI 07/24/2019 showed disc degeneration osteophytes at C5-6 C6-7 with moderate canal stenosis and right worse than left C6 and C7 foraminal stenosis.  Small central disc protrusion at C4-5 without compression.  Review of Systems 14 point view of systems unchanged from 08/09/2019 other than as mentioned above.   Objective: Vital Signs: Ht 5' 3.5" (1.613 m)   Wt 198 lb (89.8 kg)   BMI 34.52 kg/m   Physical Exam Constitutional:      Appearance: She is well-developed.  HENT:     Head: Normocephalic.     Right Ear: External ear normal.     Left Ear: External ear normal.  Eyes:     Pupils: Pupils are equal,  round, and reactive to light.  Neck:     Thyroid: No thyromegaly.     Trachea: No tracheal deviation.  Cardiovascular:     Rate and Rhythm: Normal rate.  Pulmonary:     Effort: Pulmonary effort is normal.  Abdominal:     Palpations: Abdomen is soft.  Skin:    General: Skin is warm and dry.  Neurological:     Mental Status: She is alert and oriented to person, place, and time.  Psychiatric:        Behavior: Behavior normal.     Ortho Exam patient has some brachial plexus tenderness right and left.  Reflexes are 2+ and symmetrical.  Some increase with cervical compression.  Biceps triceps wrist flexion extension is strong.  Normal heel toe gait.  Specialty Comments:  No specialty comments available.  Imaging: No results found.   PMFS History: Patient Active Problem List   Diagnosis Date Noted  . 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 Vitamin D deficiency 03/23/2018  . Hypertension   . Anxiety   . Hyperlipidemia   . GERD (gastroesophageal reflux disease) 08/10/2014  . HTN (hypertension) 01/25/2014  . Chest pain 12/08/2013  . Murmur 12/08/2013   Past Medical History:  Diagnosis Date  . Anxiety   .  Cancer ([place])    pt. reports had uterine cancer x 10 years ago  . Chest pain 12/08/2013  . GERD (gastroesophageal reflux disease) 08/10/2014  . Hyperlipidemia   . Hypertension   . Murmur 12/08/2013    Family History  Problem Relation Age of Onset  . Gallbladder disease Mother   . Heart disease Father   . Colon polyps Sister   . Diabetes Sister        x3  . Diabetes Brother   . Breast cancer Paternal Aunt   . Colon cancer Neg Hx   . Kidney disease Neg Hx   . Esophageal cancer Neg Hx     Past Surgical History:  Procedure Laterality Date  . ABDOMINAL HYSTERECTOMY    . ABDOMINAL SURGERY     Social History   Occupational History  . Occupation: Unemployed  Tobacco Use  . Smoking status: Never Smoker  . Smokeless tobacco: Never Used    Substance and Sexual Activity  . Alcohol use: Yes    Alcohol/week: 1.0 - 2.0 standard drinks    Types: 1 - 2 Cans of beer per week    Comment: a night  . Drug use: No  . Sexual activity: Yes    Birth control/protection: None

## 2019-10-12 ENCOUNTER — Encounter
Payer: No Typology Code available for payment source | Attending: Physical Medicine and Rehabilitation | Admitting: Physical Medicine and Rehabilitation

## 2019-10-12 ENCOUNTER — Other Ambulatory Visit: Payer: Self-pay

## 2019-10-12 VITALS — BP 121/80 | HR 76 | Temp 98.3°F | Ht 63.5 in | Wt 197.6 lb

## 2019-10-12 DIAGNOSIS — M4802 Spinal stenosis, cervical region: Secondary | ICD-10-CM

## 2019-10-12 DIAGNOSIS — G8929 Other chronic pain: Secondary | ICD-10-CM | POA: Insufficient documentation

## 2019-10-12 DIAGNOSIS — Z6834 Body mass index (BMI) 34.0-34.9, adult: Secondary | ICD-10-CM

## 2019-10-12 DIAGNOSIS — M5441 Lumbago with sciatica, right side: Secondary | ICD-10-CM

## 2019-10-12 DIAGNOSIS — M5116 Intervertebral disc disorders with radiculopathy, lumbar region: Secondary | ICD-10-CM | POA: Insufficient documentation

## 2019-10-12 DIAGNOSIS — E669 Obesity, unspecified: Secondary | ICD-10-CM

## 2019-10-12 DIAGNOSIS — G4701 Insomnia due to medical condition: Secondary | ICD-10-CM

## 2019-10-12 MED ORDER — CYCLOBENZAPRINE HCL 5 MG PO TABS: 5.0000 mg | ORAL_TABLET | Freq: Three times a day (TID) | ORAL | 3 refills | Status: DC | PRN

## 2019-10-12 MED FILL — CYCLOBENZAPRINE 5 MG TABLET: 5 | 10 days supply | Qty: 30 | Fill #0

## 2019-10-12 NOTE — Patient Instructions (Signed)
Inflammatory supplements that you can try: turmeric, glucosamine-chondroitin  You can try the Thercane to break apart the muscle knots.    Turmeric to reduce inflammation--can be used in cooking or taken as a supplement.  Turmeric Milk Recipe:  1 cup milk  1 tsp turmeric  1 tsp cinnamon  1 tsp grated ginger (optional)  Black pepper (boosts the anti-inflammatory properties of turmeric).  1 tsp honey

## 2019-10-12 NOTE — Progress Notes (Signed)
Subjective:    Patient ID: Christina Burke, female    DOB: Aug 19, 1960, 59 y.o.   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   Her worst pain is in her lumbar spine and she does have pain radiating down her right leg laterally. She had a lumbar XR on 2/10 that was personally reviewed and shows mild degenerative changes without acute abnormalities. The pain is 8/10 and aching and burning in quality. It is severe both in her back and leg at times. She has had epidural spinal injections in the past with good benefit.I ordered an MRI of her lumbar spine. which shows moderate impingement at L4-L5 and referred her for an epidural steroid injection with Dr. Letta Pate. Unfortunately she only obtained 1 day of relief from the procedure.  She did PT which mostly consisted of dry needling of the muscles of her upper back for her neck pain and did not help much. She would be interested in another epidural injection if this could help her. I have referred her to Dr. Ernestina Patches for this procedure but patient states that this practice does not take her insurance. I had referred her to Dr. Lorin Mercy for surgical consultation and he did recommend surgery, which she is considering. She will also ask him whether he can perform cervical epidural as an alternative option. A negative of surgery is that she is the primary caretaker of her son who has special needs and she would not be able to undergo prolonged rehabilitation as a result.   She is currently taking Cymbalta for depression and tolerates this well, but she does not feel that it helps much with her pain. She also takes Methocarbamol and she also does not  feel that this helps much.   Pain Inventory Average Pain 8 Pain Right Now 8 My pain is constant and aching  In the last 24 hours, has pain interfered with the following? General activity 7 Relation with others 7 Enjoyment of life 7 What TIME of day is your pain at its worst? all Sleep (in general) Fair  Pain is worse with: walking, bending, sitting and standing Pain improves with: rest and massage Relief from Meds: 2  Mobility walk with assistance how many minutes can you walk? 3 ability to climb steps?  yes do you drive?  yes  Function not employed: date last employed 1996 I need assistance with the following:  household duties and shopping  Neuro/Psych weakness numbness anxiety  Prior Studies Any changes since last visit?  no  Physicians involved in your care Any changes since last visit?  no   Family History  Problem Relation Age of Onset  . Gallbladder disease Mother   . Heart disease Father   . Colon polyps Sister   . Diabetes Sister        x3  . Diabetes Brother   . Breast cancer Paternal Aunt   . Colon cancer Neg Hx   . Kidney disease Neg Hx   . Esophageal cancer Neg Hx    Social History   Socioeconomic History  . Marital status: Legally Separated    Spouse name: Not on  file  . Number of children: 2  . Years of education: Not on file  . Highest education level: Not on file  Occupational History  . Occupation: Unemployed  Tobacco Use  . Smoking status: Never Smoker  . Smokeless tobacco: Never Used  Vaping Use  . Vaping Use: Never used  Substance and Sexual Activity  . Alcohol use: Yes    Alcohol/week: 1.0 - 2.0 standard drink    Types: 1 - 2 Cans of beer per week    Comment: a night  . Drug use: No  . Sexual activity: Yes    Birth control/protection: None  Other Topics Concern  . Not on file  Social History Narrative  . Not on file   Social Determinants of Health   Financial Resource Strain:   . Difficulty of Paying Living  Expenses:   Food Insecurity:   . Worried About Charity fundraiser in the Last Year:   . Arboriculturist in the Last Year:   Transportation Needs:   . Film/video editor (Medical):   Marland Kitchen Lack of Transportation (Non-Medical):   Physical Activity:   . Days of Exercise per Week:   . Minutes of Exercise per Session:   Stress:   . Feeling of Stress :   Social Connections:   . Frequency of Communication with Friends and Family:   . Frequency of Social Gatherings with Friends and Family:   . Attends Religious Services:   . Active Member of Clubs or Organizations:   . Attends Archivist Meetings:   Marland Kitchen Marital Status:    Past Surgical History:  Procedure Laterality Date  . ABDOMINAL HYSTERECTOMY    . ABDOMINAL SURGERY     Past Medical History:  Diagnosis Date  . Anxiety   . Cancer ([place])    pt. reports had uterine cancer x 10 years ago  . Chest pain 12/08/2013  . GERD (gastroesophageal reflux disease) 08/10/2014  . Hyperlipidemia   . Hypertension   . Murmur 12/08/2013   BP 121/80   Pulse 76   Temp 98.3 F (36.8 C)   Ht 5' 3.5" (1.613 m)   Wt 197 lb 9.6 oz (89.6 kg)   SpO2 97%   BMI 34.45 kg/m   Opioid Risk Score:   Fall Risk Score:  `1  Depression screen PHQ 2/9  Depression screen [facility] 2/9 09/13/2019 06/28/2019 05/04/2019 11/23/2018 06/22/2018 03/23/2018 01/29/2018  Decreased Interest 1 0 1 1 1 1 1   Down, Depressed, Hopeless 1 0 0 1 1 1  0  PHQ - 2 Score 2 0 1 2 2 2 1   Altered sleeping - 3 2 2 1  0 1  Tired, decreased energy - 0 1 0 1 1 1   Change in appetite - 0 1 2 1  0 1  Feeling bad or failure about yourself  - 0 0 0 0 0 0  Trouble concentrating - 0 0 0 0 0 0  Moving slowly or fidgety/restless - 0 0 0 0 0 0  Suicidal thoughts - 0 0 0 0 0 0  PHQ-9 Score - 3 5 6 5 3 4   Difficult doing work/chores - Somewhat difficult - - - - -  Some recent data might be hidden     Review of Systems  Musculoskeletal: Positive for gait problem.  Neurological: Positive for  weakness and numbness.  Psychiatric/Behavioral: The patient is nervous/anxious.   All other systems reviewed and are negative.      Objective:  Physical Exam Gen: no distress, normal appearing HEENT: oral mucosa pink and moist, NCAT Cardio: Reg rate Chest: normal effort, normal rate of breathing Abd: soft, non-distended Ext: no edema Skin: intact Neuro:AOx3. Musculoskeletal: Lumbar spine:4/5 strength in right side as compared to left side.+ slump test on left side. Sensation is intact throughout. Cervical spine: 4/5 strength throughout right side as compared to left side. Pain is worse with extension. Very tight cervical paraspinals and trapezius muscles bilaterally. 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 Last seen 5/18.   1) Right sided Radiculopathy, with possible compression of L5 nerve root. Pain is worsening form 7 to 8/10.   -Lumbar XRand MRIs were previously personally reviewed and discussed with patient. Unfortunately she did not get good response with lumbar epidural injection.  -Previously increaseddose of Cymbalta to 60mg  BID for better pain control. This has not provided better pain control and I advised that she decrease dose back to 60mg  daily.  -Discussed anti-inflammatory diet and weight loss ans discussed before, which she is doing very well with.   2) Cervical stenosis -MRI cervical spine personally reviewed with patient and showsmoderate canal stenosis right > left at C6 and C7 with foraminal stenosis,central disc protrusion at C4-5 with resultant mild spinalstenosis, andsubtle T2 signal abnormality within the central and dorsal cord at the level of C4-5 and C5-6, suspicious for possible mild chronic  myelomalacia. Finding somewhat difficult to be certain as there is some motion artifact through this region on this exam. --Provided with referral for cervical epidural injection. She is still considering this option --She is considering surgery but does not plant to pursue this option as this time as is the primary caregiver of her son and would not be able to participate in prolonged rehabilitation.  -Tizanidine has not been helpful for tight cervical paraspinals and trapezius muscles which is a large source of pain right now. Prescribed Flexeril instead and advised PRN use only. -Advised purchase of theracane for patient to self-relieve muscle knots. Discussed trigger point injections as option for temporary relief.   3) Obesity type 1: Encouraged exercise, time restricted low carb diet with high protein and fat which has been working well for her. Weight is stable since last month- down 1 lb. Currently 197 lbs. Discussed addition of turmeric to diet.   All questions answered. RTC PRN.

## 2019-10-13 ENCOUNTER — Encounter: Payer: Self-pay | Admitting: Physical Medicine and Rehabilitation

## 2019-10-18 ENCOUNTER — Encounter: Payer: Self-pay | Admitting: Orthopaedic Surgery

## 2019-10-18 ENCOUNTER — Ambulatory Visit (INDEPENDENT_AMBULATORY_CARE_PROVIDER_SITE_OTHER): Payer: Self-pay | Admitting: Orthopaedic Surgery

## 2019-10-18 VITALS — Ht 63.5 in | Wt 197.0 lb

## 2019-10-18 DIAGNOSIS — M4802 Spinal stenosis, cervical region: Secondary | ICD-10-CM | POA: Insufficient documentation

## 2019-10-18 DIAGNOSIS — M4722 Other spondylosis with radiculopathy, cervical region: Secondary | ICD-10-CM

## 2019-10-18 NOTE — Progress Notes (Signed)
Office Visit Note   Patient: Christina Burke           Date of Birth: Dec 20, 1960           MRN: [id number] Visit Date: 10/18/2019              Requested by: Charlott Rakes, MD [place],  [place] [address] PCP: Charlott Rakes, MD   Assessment & Plan: Visit Diagnoses:  1. Other spondylosis with radiculopathy, cervical region   2. Spinal stenosis of cervical region     Plan: Patient states she is thinking about possibly doing a cervical epidural.  Again we discussed recommendations for cervical decompression and fusion with her stenosis with no CSF from around the cord at C5-6 and C6-7.  She understands that she would be in the hospital overnight use of soft collar for several weeks after the surgery.  She will call and let us know which she would like to do.  I discussed with her in detail potential for neurologic deficit, myelomalacia, potential nonreversible neurologic deficit.  She understands and is aware of these.  Follow-Up Instructions: No follow-ups on file.   Orders:  No orders of the defined types were placed in this encounter.  No orders of the defined types were placed in this encounter.     Procedures: No procedures performed   Clinical Data: No additional findings.   Subjective: Chief Complaint  Patient presents with  . Neck - Pain, Follow-up    HPI 59 year old female returns for follow-up of cervical stenosis.  She has symptoms stenosis at C5-6 and C6-7 with questionable myelomalacia on scan.  Mild narrowing at C4-5.  She has a son who is disabled 73 years old.  Has history of multiple epidural injections cervical spine 10 years ago.  She has seen Dr.Raulkar who placed her on Cymbalta.  Originally dose was increased but then back down to 60 mg daily since she did not get any improvement.  She is taken anti-inflammatories.  She has not noticed any gait disturbance.  Recently more symptoms her right upper extremity than left.  Review of  Systems pause for anxiety GERD hypertension hyperlipidemia cervical stenosis.   Objective: Vital Signs: Ht 5' 3.5" (1.613 m)   Wt 197 lb (89.4 kg)   BMI 34.35 kg/m   Physical Exam Constitutional:      Appearance: She is well-developed.  HENT:     Head: Normocephalic.     Right Ear: External ear normal.     Left Ear: External ear normal.  Eyes:     Pupils: Pupils are equal, round, and reactive to light.  Neck:     Thyroid: No thyromegaly.     Trachea: No tracheal deviation.  Cardiovascular:     Rate and Rhythm: Normal rate.  Pulmonary:     Effort: Pulmonary effort is normal.  Abdominal:     Palpations: Abdomen is soft.  Skin:    General: Skin is warm and dry.  Neurological:     Mental Status: She is alert and oriented to person, place, and time.  Psychiatric:        Behavior: Behavior normal.     Ortho Exam patient has 1+ upper extremity reflexes 3+ knee and ankle jerk.  No clonus.  She has brachial plexus tenderness right and left.  Specialty Comments:  No specialty comments available.  Imaging:    PMFS History: Patient Active Problem List   Diagnosis Date Noted  . Spinal stenosis of cervical  region 10/18/2019  . 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 Vitamin D deficiency 03/23/2018  . Hypertension   . Anxiety   . Hyperlipidemia   . GERD (gastroesophageal reflux disease) 08/10/2014  . HTN (hypertension) 01/25/2014  . Chest pain 12/08/2013  . Murmur 12/08/2013   Past Medical History:  Diagnosis Date  . Anxiety   . Cancer ([facility])    pt. reports had uterine cancer x 10 years ago  . Chest pain 12/08/2013  . GERD (gastroesophageal reflux disease) 08/10/2014  . Hyperlipidemia   . Hypertension   . Murmur 12/08/2013    Family History  Problem Relation Age of Onset  . Gallbladder disease Mother   . Heart disease Father   . Colon polyps Sister   . Diabetes Sister        x3  . Diabetes Brother   . Breast cancer Paternal Aunt     . Colon cancer Neg Hx   . Kidney disease Neg Hx   . Esophageal cancer Neg Hx     Past Surgical History:  Procedure Laterality Date  . ABDOMINAL HYSTERECTOMY    . ABDOMINAL SURGERY     Social History   Occupational History  . Occupation: Unemployed  Tobacco Use  . Smoking status: Never Smoker  . Smokeless tobacco: Never Used  Vaping Use  . Vaping Use: Never used  Substance and Sexual Activity  . Alcohol use: Yes    Alcohol/week: 1.0 - 2.0 standard drink    Types: 1 - 2 Cans of beer per week    Comment: a night  . Drug use: No  . Sexual activity: Yes    Birth control/protection: None

## 2019-11-03 ENCOUNTER — Telehealth: Payer: Self-pay | Admitting: Family Medicine

## 2019-11-03 NOTE — Telephone Encounter (Signed)
Please f/u   Copied from [place] [id number]. Topic: General - Other >> Nov 01, 2019  8:39 AM Leward Quan A wrote: Reason for CRM: Patient called to speak to someone regarding financial assistance she was told to call today, I was not sure whom she was to speak with but no answer. Please call Ph# [phone] >> Nov 01, 2019 10:57 AM Leward Quan A wrote: Patient called back to say that she is still waiting to hear from someone maybe Clifton James about financial assistance Please call Ph# [phone]

## 2019-11-03 NOTE — Telephone Encounter (Signed)
I try to contact the Pt, but her VM is full, unable to speak with the Pt

## 2019-11-07 ENCOUNTER — Ambulatory Visit: Payer: Self-pay | Attending: Family Medicine

## 2019-11-07 ENCOUNTER — Other Ambulatory Visit: Payer: Self-pay

## 2019-11-23 MED FILL — BENAZEPRIL HCL 20 MG TABLET: 20 | 30 days supply | Qty: 30 | Fill #4

## 2019-12-13 ENCOUNTER — Other Ambulatory Visit: Payer: Self-pay | Admitting: Family Medicine

## 2019-12-13 ENCOUNTER — Other Ambulatory Visit: Payer: Self-pay

## 2019-12-13 ENCOUNTER — Ambulatory Visit: Payer: Self-pay | Attending: Family Medicine | Admitting: Family Medicine

## 2019-12-13 ENCOUNTER — Encounter: Payer: Self-pay | Admitting: Family Medicine

## 2019-12-13 VITALS — BP 116/76 | HR 70 | Ht 63.5 in | Wt 202.0 lb

## 2019-12-13 DIAGNOSIS — Z1231 Encounter for screening mammogram for malignant neoplasm of breast: Secondary | ICD-10-CM

## 2019-12-13 DIAGNOSIS — Z113 Encounter for screening for infections with a predominantly sexual mode of transmission: Secondary | ICD-10-CM

## 2019-12-13 DIAGNOSIS — G8929 Other chronic pain: Secondary | ICD-10-CM

## 2019-12-13 DIAGNOSIS — I10 Essential (primary) hypertension: Secondary | ICD-10-CM

## 2019-12-13 DIAGNOSIS — R252 Cramp and spasm: Secondary | ICD-10-CM

## 2019-12-13 DIAGNOSIS — M5441 Lumbago with sciatica, right side: Secondary | ICD-10-CM

## 2019-12-13 MED ORDER — LIDOCAINE 5 % EX PTCH: 1.0000 | MEDICATED_PATCH | CUTANEOUS | 3 refills | Status: DC

## 2019-12-13 MED ORDER — DICLOFENAC SODIUM 1 % EX GEL: 2.0000 g | Freq: Four times a day (QID) | CUTANEOUS | 3 refills | Status: DC

## 2019-12-13 MED ORDER — BENAZEPRIL HCL 20 MG PO TABS: 20.0000 mg | ORAL_TABLET | Freq: Every day | ORAL | 6 refills | Status: DC

## 2019-12-13 NOTE — Progress Notes (Signed)
Having cramps in legs at night.

## 2019-12-13 NOTE — Patient Instructions (Signed)
Muscle Cramps and Spasms Muscle cramps and spasms are when muscles tighten by themselves. They usually get better within minutes. Muscle cramps are painful. They are usually stronger and last longer than muscle spasms. Muscle spasms may or may not be painful. They can last a few seconds or much longer. Cramps and spasms can affect any muscle, but they occur most often in the calf muscles of the leg. They are usually not caused by a serious problem. In many cases, the cause is not known. Some common causes include:  Doing more physical work or exercise than your body is ready for.  Using the muscles too much (overuse) by repeating certain movements too many times.  Staying in a certain position for a long time.  Playing a sport or doing an activity without preparing properly.  Using bad form or technique while playing a sport or doing an activity.  Not having enough water in your body (dehydration).  Injury.  Side effects of some medicines.  Low levels of the salts and minerals in your blood (electrolytes), such as low potassium or calcium. Follow these instructions at home: Managing pain and stiffness      Massage, stretch, and relax the muscle. Do this for many minutes at a time.  If told, put heat on tight or tense muscles as often as told by your doctor. Use the heat source that your doctor recommends, such as a moist heat pack or a heating pad. ? Place a towel between your skin and the heat source. ? Leave the heat on for 20-30 minutes. ? Remove the heat if your skin turns bright red. This is very important if you are not able to feel pain, heat, or cold. You may have a greater risk of getting burned.  If told, put ice on the affected area. This may help if you are sore or have pain after a cramp or spasm. ? Put ice in a plastic bag. ? Place a towel between your skin and the bag. ? Leave the ice on for 20 minutes, 2-3 times a day.  Try taking hot showers or baths to help  relax tight muscles. Eating and drinking  Drink enough fluid to keep your pee (urine) pale yellow.  Eat a healthy diet to help ensure that your muscles work well. This should include: ? Fruits and vegetables. ? Lean protein. ? Whole grains. ? Low-fat or nonfat dairy products. General instructions  If you are having cramps often, avoid intense exercise for several days.  Take over-the-counter and prescription medicines only as told by your doctor.  Watch for any changes in your symptoms.  Keep all follow-up visits as told by your doctor. This is important. Contact a doctor if:  Your cramps or spasms get worse or happen more often.  Your cramps or spasms do not get better with time. Summary  Muscle cramps and spasms are when muscles tighten by themselves. They usually get better within minutes.  Cramps and spasms occur most often in the calf muscles of the leg.  Massage, stretch, and relax the muscle. This may help the cramp or spasm go away.  Drink enough fluid to keep your pee (urine) pale yellow. This information is not intended to replace advice given to you by your health care provider. Make sure you discuss any questions you have with your health care provider. Document Revised: 09/07/2017 Document Reviewed: 09/07/2017 Elsevier Patient Education  2020 [employer]vier Inc.  

## 2019-12-13 NOTE — Progress Notes (Signed)
Subjective:  Patient ID: Christina Burke, female    DOB: Sep 25, 1960  Age: 59 y.o. MRN: [id number]  CC: Hypertension   HPI Christina Burke is a 59 year old female with history of hypertension, anxiety, history of endometrial CA confined to the uterus status post hysterectomy who presents today for follow-up visit. Seen by Dr. Lorin Mercy of orthopedics on 10/18/2019 for spondylosis of cervical region with associated radiculopathy and notes reveal recommendation for cervical decompression and fusion. Toms started She is undecided about surgery and would like to hold off.  She has cramps in both arms and legs when she wakes up and she does not sleep good due to this.  Stopped her prescription NSAIDS and muscle relaxants and only  Uses them prn as she would like to try vitamins and natural stuff. She is on the keto diet and noticed symptoms started around the same time she commenced keto diet.  Has undergone physical therapy which was beneficial at the time but when she stopped symptoms returned. Endorses not drinking adequate amounts of water as she likes diet drinks. Taking Tumeric, Zinc, Mg, Vitamin D  Compliant with her antihypertensive. Would like STD test as she recently changed sexual partners Past Medical History:  Diagnosis Date  . Anxiety   . Cancer ([facility] [place])    pt. reports had uterine cancer x 10 years ago  . Chest pain 12/08/2013  . GERD (gastroesophageal reflux disease) 08/10/2014  . Hyperlipidemia   . Hypertension   . Murmur 12/08/2013    Past Surgical History:  Procedure Laterality Date  . ABDOMINAL HYSTERECTOMY    . ABDOMINAL SURGERY      Family History  Problem Relation Age of Onset  . Gallbladder disease Mother   . Heart disease Father   . Colon polyps Sister   . Diabetes Sister        x3  . Diabetes Brother   . Breast cancer Paternal Aunt   . Colon cancer Neg Hx   . Kidney disease Neg Hx   . Esophageal cancer Neg Hx     Allergies  Allergen Reactions  .  Amoxicillin Rash    Has patient had a PCN reaction causing immediate rash, facial/tongue/throat swelling, SOB or lightheadedness with hypotension: yes Has patient had a PCN reaction causing severe rash involving mucus membranes or skin necrosis: no Has patient had a PCN reaction that required hospitalization: no Has patient had a PCN reaction occurring within the last 10 years: no If all of the above answers are "NO", then may proceed with Cephalosporin use.   Marland Kitchen Penicillins Rash    Outpatient Medications Prior to Visit  Medication Sig Dispense Refill  . clonazePAM (KLONOPIN) 1 MG tablet Take 1 mg by mouth 2 (two) times daily.    . cyclobenzaprine (FLEXERIL) 5 MG tablet Take 1 tablet (5 mg total) by mouth 3 (three) times daily as needed for muscle spasms. 30 tablet 3  . DULoxetine (CYMBALTA) 60 MG capsule Take 1 capsule (60 mg total) by mouth 2 (two) times daily. For back pain 60 capsule 3  . gabapentin (NEURONTIN) 300 MG capsule Take 1 capsule (300 mg total) by mouth 3 (three) times daily. 90 capsule 0  . methocarbamol (ROBAXIN) 500 MG tablet Take 1 tablet (500 mg total) by mouth every 8 (eight) hours as needed for muscle spasms. 60 tablet 6  . ondansetron (ZOFRAN ODT) 4 MG disintegrating tablet Take 1 tablet (4 mg total) by mouth every 8 (eight) hours as needed for nausea or vomiting.  20 tablet 0  . benazepril (LOTENSIN) 20 MG tablet Take 1 tablet (20 mg total) by mouth daily. 30 tablet 6  . meloxicam (MOBIC) 7.5 MG tablet Take 1 tablet (7.5 mg total) by mouth daily. 30 tablet 6  . tizanidine (ZANAFLEX) 2 MG capsule Take 1 capsule (2 mg total) by mouth at bedtime as needed for muscle spasms. (Patient not taking: Reported on 10/18/2019) 30 capsule 0   No facility-administered medications prior to visit.     ROS Review of Systems  Constitutional: Negative for activity change, appetite change and fatigue.  HENT: Negative for congestion, sinus pressure and sore throat.   Eyes: Negative for  visual disturbance.  Respiratory: Negative for cough, chest tightness, shortness of breath and wheezing.   Cardiovascular: Negative for chest pain and palpitations.  Gastrointestinal: Negative for abdominal distention, abdominal pain and constipation.  Endocrine: Negative for polydipsia.  Genitourinary: Negative for dysuria and frequency.  Musculoskeletal:       See HPI  Skin: Negative for rash.  Neurological: Negative for tremors, light-headedness and numbness.  Hematological: Does not bruise/bleed easily.  Psychiatric/Behavioral: Negative for agitation and behavioral problems.    Objective:  BP 116/76   Pulse 70   Ht 5' 3.5" (1.613 m)   Wt 202 lb (91.6 kg)   SpO2 97%   BMI 35.22 kg/m   BP/Weight 12/13/2019 10/18/2019 08/19/5359  Systolic BP 443 - 154  Diastolic BP 76 - 80  Wt. (Lbs) 202 197 197.6  BMI 35.22 34.35 34.45      Physical Exam Constitutional:      Appearance: She is well-developed.  Neck:     Vascular: No JVD.  Cardiovascular:     Rate and Rhythm: Normal rate.     Heart sounds: Murmur heard.   Pulmonary:     Effort: Pulmonary effort is normal.     Breath sounds: Normal breath sounds. No wheezing or rales.  Chest:     Chest wall: No tenderness.  Abdominal:     General: Bowel sounds are normal. There is no distension.     Palpations: Abdomen is soft. There is no mass.     Tenderness: There is no abdominal tenderness.  Musculoskeletal:        General: Normal range of motion.     Right lower leg: No edema.     Left lower leg: No edema.  Neurological:     Mental Status: She is alert and oriented to person, place, and time.  Psychiatric:        Mood and Affect: Mood normal.     CMP Latest Ref Rng & Units 04/20/2019 11/23/2018 03/23/2018  Glucose 70 - 99 mg/dL 118(H) 100(H) 85  BUN 6 - 20 mg/dL 9 16 15   Creatinine 0.44 - 1.00 mg/dL 0.77 0.85 0.65  Sodium 135 - 145 mmol/L 138 139 138  Potassium 3.5 - 5.1 mmol/L 4.0 4.4 4.6  Chloride 98 - 111 mmol/L  103 103 100  CO2 22 - 32 mmol/L 23 22 23   Calcium 8.9 - 10.3 mg/dL 9.5 9.4 9.8  Total Protein 6.5 - 8.1 g/dL 7.5 7.3 7.0  Total Bilirubin 0.3 - 1.2 mg/dL 0.7 0.5 0.3  Alkaline Phos 38 - 126 U/L 68 69 65  AST 15 - 41 U/L 23 18 13   ALT 0 - 44 U/L 24 15 14     Lipid Panel     Component Value Date/Time   CHOL 216 (H) 11/23/2018 1145   TRIG 106 11/23/2018 1145  HDL 59 11/23/2018 1145   CHOLHDL 3.7 11/23/2018 1145   CHOLHDL 3.9 04/08/2016 1025   VLDL 12 04/08/2016 1025   LDLCALC 136 (H) 11/23/2018 1145    CBC    Component Value Date/Time   WBC 11.5 (H) 04/20/2019 1123   RBC 4.61 04/20/2019 1123   HGB 14.0 04/20/2019 1123   HGB 14.6 11/18/2017 0849   HCT 42.2 04/20/2019 1123   HCT 43.5 11/18/2017 0849   PLT 311 04/20/2019 1123   PLT 276 11/18/2017 0849   MCV 91.5 04/20/2019 1123   MCV 93 11/18/2017 0849   MCH 30.4 04/20/2019 1123   MCHC 33.2 04/20/2019 1123   RDW 12.5 04/20/2019 1123   RDW 15.0 11/18/2017 0849   LYMPHSABS 2.1 11/18/2017 0849   MONOABS 0.3 03/01/2015 1600   EOSABS 0.1 11/18/2017 0849   BASOSABS 0.0 11/18/2017 0849    Lab Results  Component Value Date   HGBA1C 5.6 (A) 11/23/2018    Assessment & Plan:  1. Essential hypertension Controlled Counseled on blood pressure goal of less than 130/80, low-sodium, DASH diet, medication compliance, 150 minutes of moderate intensity exercise per week. Discussed medication compliance, adverse effects. - CMP14+EGFR - Lipid panel - benazepril (LOTENSIN) 20 MG tablet; Take 1 tablet (20 mg total) by mouth daily.  Dispense: 30 tablet; Refill: 6  2. Screening for STD (sexually transmitted disease) - Cervicovaginal ancillary only - HIV Antibody (routine testing w rflx) - HSV Type I/II IgG, IgMw/ reflex  3. Chronic midline low back pain with right-sided sciatica Intermittent pain Completed PT Trial of Lidoderm patch and Voltaren gel which are topical Discontinue meloxicam She does have muscle relaxants to use as  needed - lidocaine (LIDODERM) 5 %; Place 1 patch onto the skin daily. Remove & Discard patch within 12 hours or as directed by MD  Dispense: 30 patch; Refill: 3  4. Muscle cramps Advised to stay hydrated - Magnesium - CBC with Differential/Platelet - diclofenac Sodium (VOLTAREN) 1 % GEL; Apply 2 g topically 4 (four) times daily.  Dispense: 100 g; Refill: 3  5. Encounter for screening mammogram for malignant neoplasm of breast - MM 3D SCREEN BREAST BILATERAL; Future .   Meds ordered this encounter  Medications  . benazepril (LOTENSIN) 20 MG tablet    Sig: Take 1 tablet (20 mg total) by mouth daily.    Dispense:  30 tablet    Refill:  6  . diclofenac Sodium (VOLTAREN) 1 % GEL    Sig: Apply 2 g topically 4 (four) times daily.    Dispense:  100 g    Refill:  3  . lidocaine (LIDODERM) 5 %    Sig: Place 1 patch onto the skin daily. Remove & Discard patch within 12 hours or as directed by MD    Dispense:  30 patch    Refill:  3    Follow-up: Return in about 6 months (around 06/14/2020) for chronic disease management.       Charlott Rakes, MD, FAAFP. [facility] [place], [place]   12/13/2019, 5:12 PM

## 2019-12-14 ENCOUNTER — Other Ambulatory Visit: Payer: Self-pay | Admitting: *Deleted

## 2019-12-14 DIAGNOSIS — Z1231 Encounter for screening mammogram for malignant neoplasm of breast: Secondary | ICD-10-CM

## 2019-12-14 LAB — CMP14+EGFR
ALT: 14 IU/L (ref 0–32)
AST: 14 IU/L (ref 0–40)
Albumin/Globulin Ratio: 1.8 (ref 1.2–2.2)
Albumin: 4.8 g/dL (ref 3.8–4.9)
Alkaline Phosphatase: 70 IU/L (ref 48–121)
BUN/Creatinine Ratio: 19 (ref 9–23)
BUN: 14 mg/dL (ref 6–24)
Bilirubin Total: 0.5 mg/dL (ref 0.0–1.2)
CO2: 23 mmol/L (ref 20–29)
Calcium: 9.7 mg/dL (ref 8.7–10.2)
Chloride: 101 mmol/L (ref 96–106)
Creatinine, Ser: 0.73 mg/dL (ref 0.57–1.00)
GFR calc Af Amer: 104 mL/min/{1.73_m2} (ref >59)
GFR calc non Af Amer: 90 mL/min/{1.73_m2} (ref >59)
Globulin, Total: 2.7 g/dL (ref 1.5–4.5)
Glucose: 91 mg/dL (ref 65–99)
Potassium: 4.5 mmol/L (ref 3.5–5.2)
Sodium: 137 mmol/L (ref 134–144)
Total Protein: 7.5 g/dL (ref 6.0–8.5)

## 2019-12-14 LAB — CBC WITH DIFFERENTIAL/PLATELET
Basophils Absolute: 0.1 10*3/uL (ref 0.0–0.2)
Basos: 1 %
EOS (ABSOLUTE): 0.1 10*3/uL (ref 0.0–0.4)
Eos: 2 %
Hematocrit: 41.5 % (ref 34.0–46.6)
Hemoglobin: 13.7 g/dL (ref 11.1–15.9)
Immature Grans (Abs): 0 10*3/uL (ref 0.0–0.1)
Immature Granulocytes: 0 %
Lymphocytes Absolute: 1.4 10*3/uL (ref 0.7–3.1)
Lymphs: 23 %
MCH: 30.6 pg (ref 26.6–33.0)
MCHC: 33 g/dL (ref 31.5–35.7)
MCV: 93 fL (ref 79–97)
Monocytes Absolute: 0.5 10*3/uL (ref 0.1–0.9)
Monocytes: 8 %
Neutrophils Absolute: 4 10*3/uL (ref 1.4–7.0)
Neutrophils: 66 %
Platelets: 251 10*3/uL (ref 150–450)
RBC: 4.47 x10E6/uL (ref 3.77–5.28)
RDW: 12.4 % (ref 11.7–15.4)
WBC: 6 10*3/uL (ref 3.4–10.8)

## 2019-12-14 LAB — LIPID PANEL
Chol/HDL Ratio: 3.6 ratio (ref 0.0–4.4)
Cholesterol, Total: 224 mg/dL — ABNORMAL HIGH (ref 100–199)
HDL: 63 mg/dL (ref >39)
LDL Chol Calc (NIH): 144 mg/dL — ABNORMAL HIGH (ref 0–99)
Triglycerides: 97 mg/dL (ref 0–149)
VLDL Cholesterol Cal: 17 mg/dL (ref 5–40)

## 2019-12-14 LAB — HIV ANTIBODY (ROUTINE TESTING W REFLEX): HIV Screen 4th Generation wRfx: NONREACTIVE

## 2019-12-14 LAB — MAGNESIUM: Magnesium: 2.1 mg/dL (ref 1.6–2.3)

## 2019-12-14 MED FILL — DICLOFENAC SODIUM 1% GEL: 1 | 12 days supply | Qty: 100 | Fill #0

## 2019-12-15 ENCOUNTER — Telehealth: Payer: Self-pay | Admitting: *Deleted

## 2019-12-15 DIAGNOSIS — R1310 Dysphagia, unspecified: Secondary | ICD-10-CM

## 2019-12-15 LAB — CERVICOVAGINAL ANCILLARY ONLY
Bacterial Vaginitis (gardnerella): NEGATIVE
Candida Glabrata: POSITIVE — AB
Candida Vaginitis: NEGATIVE
Chlamydia: NEGATIVE
Comment: NEGATIVE
Comment: NEGATIVE
Comment: NEGATIVE
Comment: NEGATIVE
Comment: NEGATIVE
Comment: NORMAL
Neisseria Gonorrhea: NEGATIVE
Trichomonas: NEGATIVE

## 2019-12-15 MED FILL — ?LIDOCAINE 5% PATCH: 5 | 30 days supply | Qty: 30 | Fill #0

## 2019-12-15 NOTE — Telephone Encounter (Signed)
She is needing a referral for gastro to get her esophagus stretched. She states that it feel like her food is getting stuck.

## 2019-12-15 NOTE — Telephone Encounter (Signed)
Done

## 2019-12-15 NOTE — Addendum Note (Signed)
Addended byCharlott Rakes on: 12/15/2019 06:58 PM   Modules accepted: Orders

## 2019-12-15 NOTE — Telephone Encounter (Signed)
Patient walked in stating she forgot to tell the doctor when she was recently seen that she needs a referral to a gastro doctor. Please call patient at [phone]

## 2019-12-16 ENCOUNTER — Other Ambulatory Visit: Payer: Self-pay | Admitting: Family Medicine

## 2019-12-16 ENCOUNTER — Encounter: Payer: Self-pay | Admitting: Gastroenterology

## 2019-12-16 DIAGNOSIS — Z113 Encounter for screening for infections with a predominantly sexual mode of transmission: Secondary | ICD-10-CM

## 2019-12-16 MED ORDER — FLUCONAZOLE 150 MG PO TABS: 150.0000 mg | ORAL_TABLET | Freq: Once | ORAL | 0 refills | Status: AC

## 2019-12-16 MED FILL — FLUCONAZOLE 150 MG TABLET: 150 | 1 days supply | Qty: 1 | Fill #0

## 2019-12-16 NOTE — Telephone Encounter (Signed)
Patient was called and informed of referral being placed. 

## 2019-12-19 LAB — HSV(HERPES SIMPLEX VRS) I + II AB-IGG
HSV 1 Glycoprotein G Ab, IgG: <0.91 index (ref 0.00–0.90)
HSV 2 IgG, Type Spec: <0.91 index (ref 0.00–0.90)

## 2019-12-19 LAB — SPECIMEN STATUS REPORT

## 2019-12-26 MED FILL — BENAZEPRIL HCL 20 MG TABLET: 20 | 30 days supply | Qty: 30 | Fill #5

## 2020-01-05 ENCOUNTER — Ambulatory Visit
Admission: RE | Admit: 2020-01-05 | Discharge: 2020-01-05 | Disposition: A | Discharge status: Home / Self Care | Payer: No Typology Code available for payment source | Source: Ambulatory Visit | Attending: Family Medicine | Admitting: Family Medicine

## 2020-01-05 ENCOUNTER — Ambulatory Visit: Payer: Self-pay | Admitting: *Deleted

## 2020-01-05 ENCOUNTER — Other Ambulatory Visit: Payer: Self-pay

## 2020-01-05 VITALS — BP 128/82 | Temp 97.1°F | Wt 204.2 lb

## 2020-01-05 DIAGNOSIS — Z01419 Encounter for gynecological examination (general) (routine) without abnormal findings: Secondary | ICD-10-CM

## 2020-01-05 DIAGNOSIS — Z1231 Encounter for screening mammogram for malignant neoplasm of breast: Secondary | ICD-10-CM

## 2020-01-05 IMAGING — MG DIGITAL SCREENING BILAT W/ TOMO W/ CAD
6 of 10 series · 6 of 30 positions shown · non-contrast
Comparison: Previous exam(s).

CLINICAL DATA: Screening.

EXAM:
DIGITAL SCREENING BILATERAL MAMMOGRAM WITH TOMO AND CAD

[L MLO synth-2D]
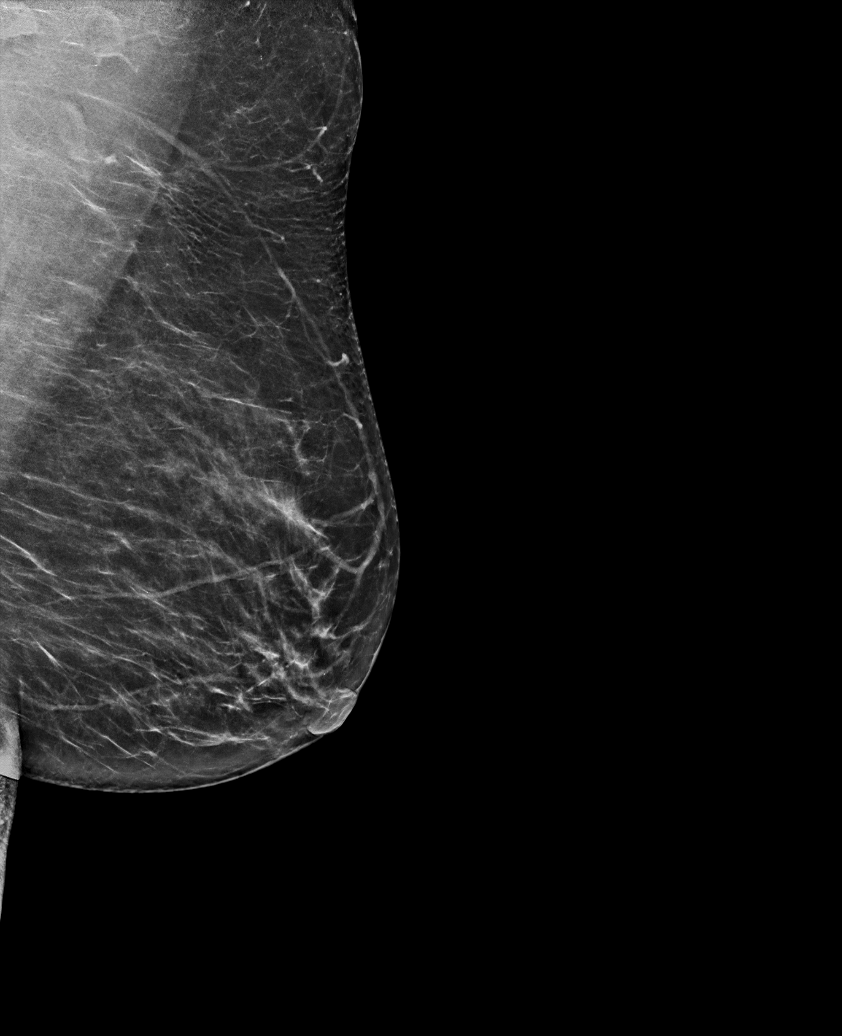

[R MLO synth-2D (1 of 2)]
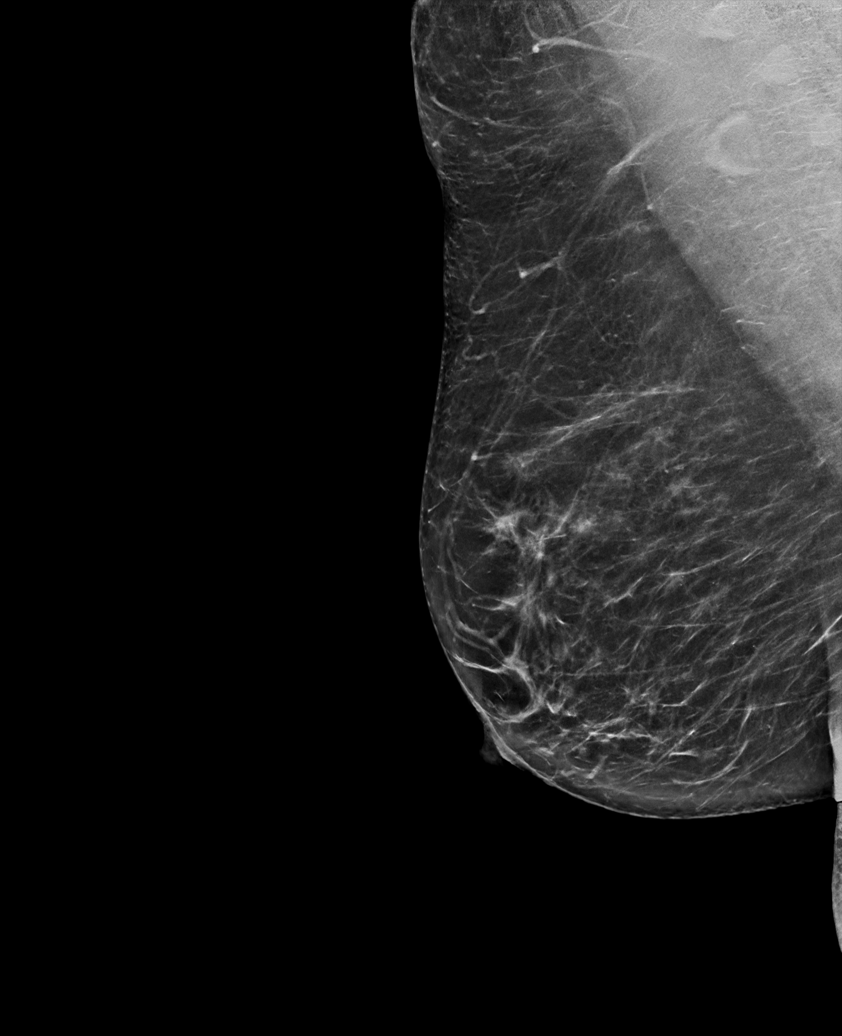

[L CC synth-2D]
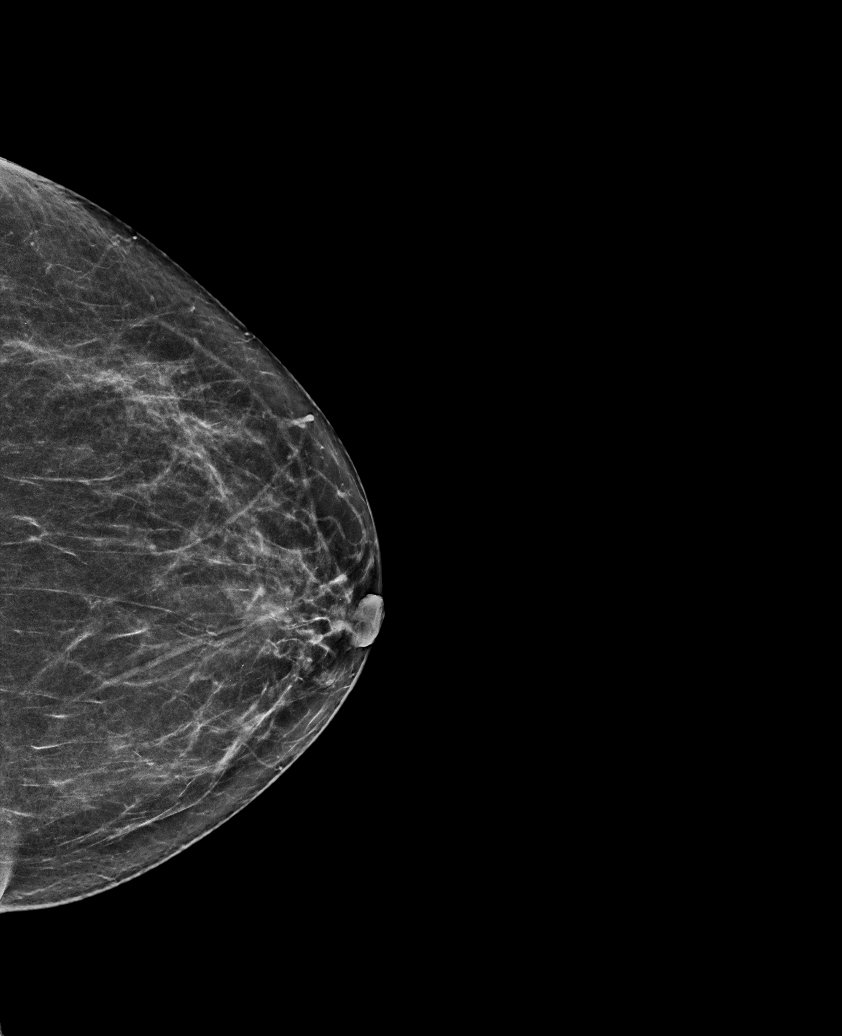

[R MLO synth-2D (2 of 2)]
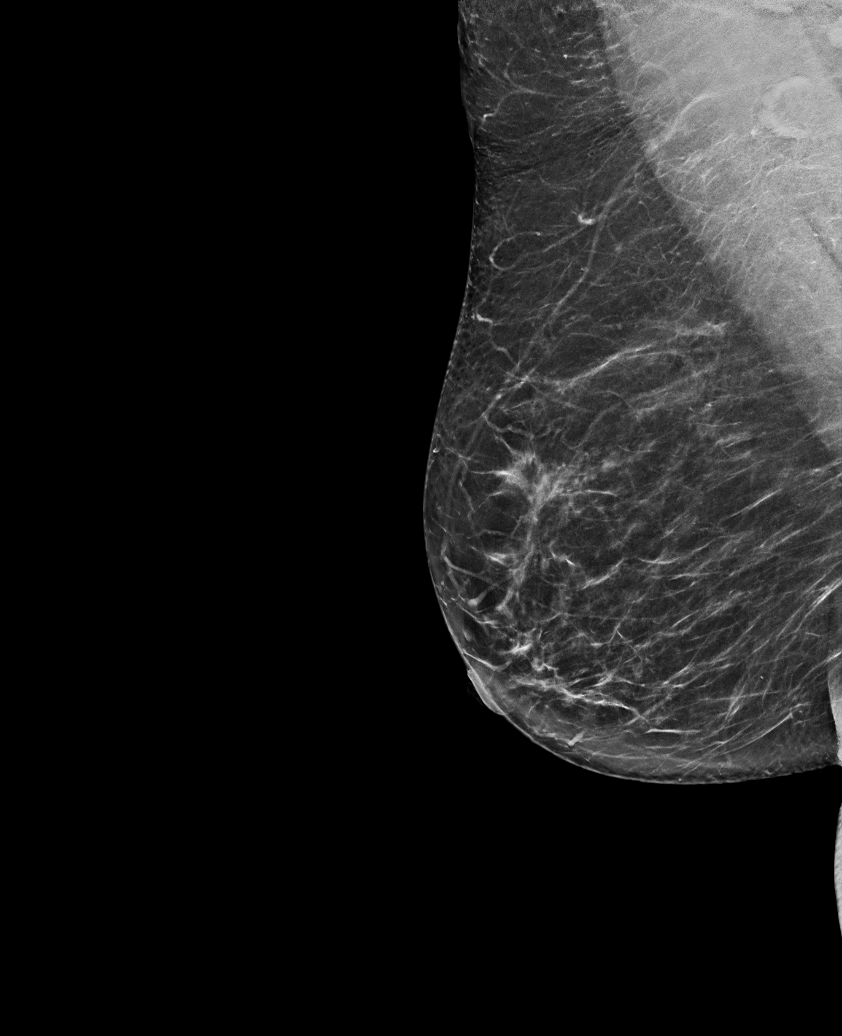

[R CC synth-2D]
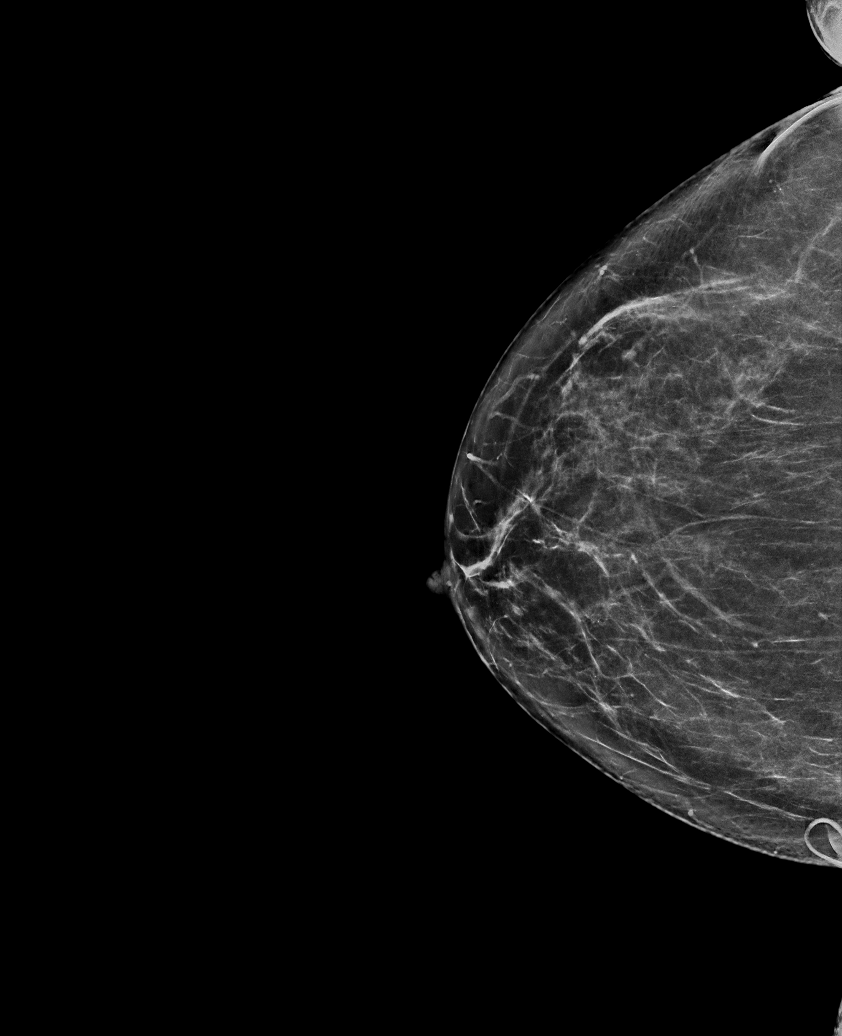

[L CC tomo · tomo slice 32/63.0]
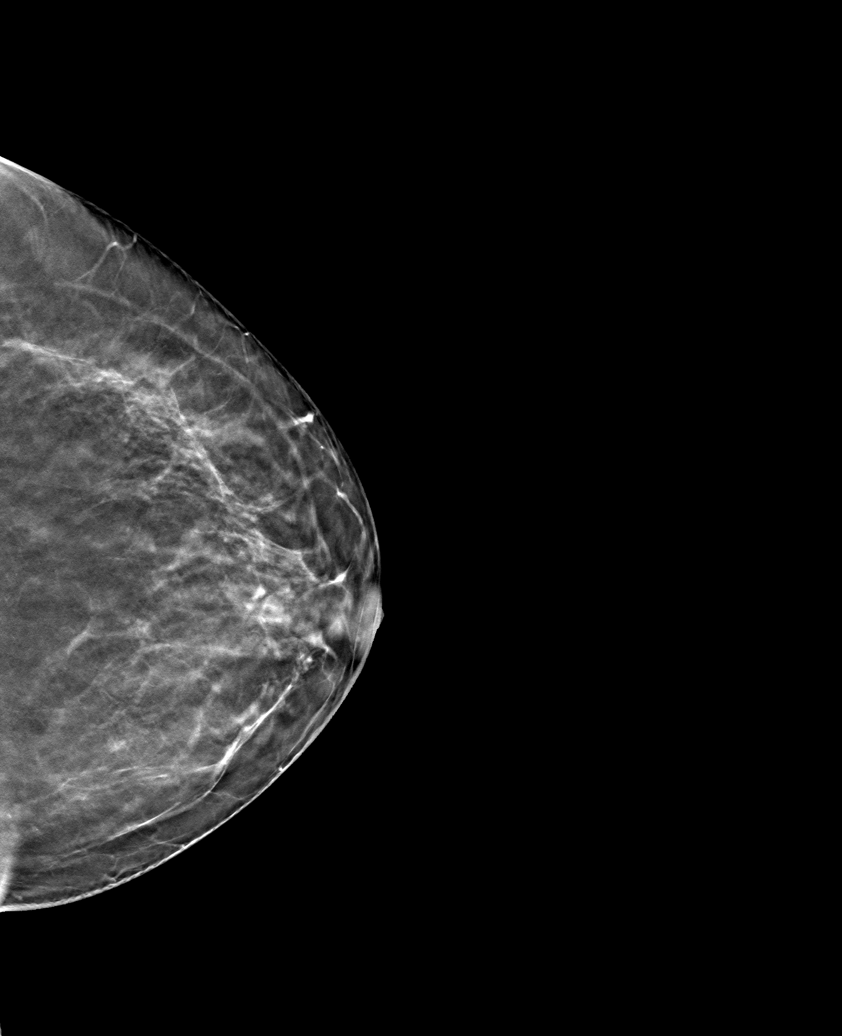

[6 of 30 positions shown; findings below may reference images not displayed]

ACR Breast Density Category b: There are scattered areas of
fibroglandular density.
FINDINGS: There are no findings suspicious for malignancy. Images were
processed with CAD.
IMPRESSION: No mammographic evidence of malignancy. A result letter of this
screening mammogram will be mailed directly to the patient.

RECOMMENDATION:
Screening mammogram in one year. (Code:[TQ])

BI-RADS CATEGORY  1: Negative.

## 2020-01-05 NOTE — Progress Notes (Signed)
Ms. Christina Burke is a 59 y.o. female who presents to [facility] clinic today with no complaints.   Pap Smear: Pap smear completed today. Last Pap smear was 11/17/2017 at [facility] and was normal with negative HPV. Per patient has no history of an abnormal Pap smear. Patient has a history of a hysterectomy for uterine cancer and AUB 02/04/2005. Last Pap smear and hysterectomy results are in Epic.   Physical exam: Breasts Breasts symmetrical. No skin abnormalities bilateral breasts. No nipple retraction bilateral breasts. No nipple discharge bilateral breasts. No lymphadenopathy. No lumps palpated bilateral breasts. No complaints of pain or tenderness on exam.       Pelvic/Bimanual Ext Genitalia No lesions, no swelling and no discharge observed on external genitalia.        Vagina Vagina pink and normal texture. No lesions or discharge observed in vagina.        Cervix Cervix is absent due to history of hysterectomy for uterine cancer.   Uterus Uterus is absent due to history of hysterectomy for uterine cancer.   Adnexae Bilateral ovaries present and palpable. No tenderness on palpation.         Rectovaginal No rectal exam completed today since patient had no rectal complaints. No skin abnormalities observed on exam.     Smoking History: Patient has never smoked.   Patient Navigation: Patient education provided. Access to services provided for patient through [facility] program.   Colorectal Cancer Screening: Per patient had a colonoscopy 8 years ago. FIT Test completed 01/29/2018 and negative. No complaints today.    Breast and Cervical Cancer Risk Assessment: Patient has no family history of a paternal aunt having  breast cancer. Patient has no known genetic mutations or history of radiation treatment to the chest before age 59. Patient has no history of cervical dysplasia, immunocompromised, or DES exposure in-utero.  Risk Assessment    Risk  Scores      01/05/2020 12/31/2017   Last edited by: Demetrius Revel, LPN Rolena Infante H, LPN   5-year risk: 1.9 % 1.8 %   Lifetime risk: 10.2 % 10.7 %         A: [facility] exam with pap smear No complaints.  P: Referred patient to the [facility] for a screening mammogram on the mobile unit. Appointment scheduled Thursday, January 05, 2020 at 1150.  Loletta Parish, RN 01/05/2020 11:19 AM

## 2020-01-05 NOTE — Patient Instructions (Signed)
Explained breast self awareness with Christina Burke. Pap smear completed today. Referred patient to the [facility] for a screening mammogram on the mobile unit. Appointment scheduled Thursday, January 05, 2020 at 1150. Patient escorted to mobile unit following [facility] appointment for mammogram. Let patient know will follow up with her within the next couple of weeks with results of her Pap smear by letter or phone. Informed patient that the [facility] will follow up with her within the next couple weeks with results of her mammogram by letter or phone. Christina Burke verbalized understanding.  Tynika Luddy, Arvil Chaco, RN 11:20 AM

## 2020-01-10 LAB — CYTOLOGY - PAP
Comment: NEGATIVE
Diagnosis: NEGATIVE
High risk HPV: NEGATIVE

## 2020-02-16 ENCOUNTER — Other Ambulatory Visit (INDEPENDENT_AMBULATORY_CARE_PROVIDER_SITE_OTHER): Payer: Self-pay

## 2020-02-16 ENCOUNTER — Other Ambulatory Visit: Payer: Self-pay | Admitting: Gastroenterology

## 2020-02-16 ENCOUNTER — Encounter: Payer: Self-pay | Admitting: Gastroenterology

## 2020-02-16 ENCOUNTER — Ambulatory Visit (INDEPENDENT_AMBULATORY_CARE_PROVIDER_SITE_OTHER): Payer: Self-pay | Admitting: Gastroenterology

## 2020-02-16 VITALS — HR 67 | Ht 63.5 in | Wt 209.0 lb

## 2020-02-16 DIAGNOSIS — K219 Gastro-esophageal reflux disease without esophagitis: Secondary | ICD-10-CM

## 2020-02-16 DIAGNOSIS — R1310 Dysphagia, unspecified: Secondary | ICD-10-CM

## 2020-02-16 DIAGNOSIS — K222 Esophageal obstruction: Secondary | ICD-10-CM

## 2020-02-16 DIAGNOSIS — Z8371 Family history of colonic polyps: Secondary | ICD-10-CM

## 2020-02-16 DIAGNOSIS — R109 Unspecified abdominal pain: Secondary | ICD-10-CM

## 2020-02-16 DIAGNOSIS — K5900 Constipation, unspecified: Secondary | ICD-10-CM

## 2020-02-16 MED ORDER — SUPREP BOWEL PREP KIT 17.5-3.13-1.6 GM/177ML PO SOLN: 1.0000 | ORAL | 0 refills | Status: DC

## 2020-02-16 MED ORDER — ESOMEPRAZOLE MAGNESIUM 40 MG PO CPDR: 40.0000 mg | DELAYED_RELEASE_CAPSULE | Freq: Every day | ORAL | 2 refills | Status: DC

## 2020-02-16 MED FILL — SUPREP BOWEL PREP KIT: 17.5-3.13-1 | 1 days supply | Qty: 354 | Fill #0

## 2020-02-16 MED FILL — ESOMEPRAZOLE MAG DR 40 MG C: 40 | 30 days supply | Qty: 30 | Fill #0

## 2020-02-16 NOTE — Progress Notes (Signed)
[place] VISIT   Primary Care Provider Charlott Rakes, MD [place] [address] [phone]  Referring Provider Charlott Rakes, MD [address] [place],  [place] [address] [phone]  Patient Profile: Christina Burke is a 59 y.o. female with a pmh significant for uterine cancer survivor, hypertension, hyperlipidemia, obesity, anxiety, GERD, prior esophageal stricture, family history of colon polyps.  The patient presents to the [facility] for an evaluation and management of problem(s) noted below:  Problem List 1. Dysphagia, unspecified type   2. Schatzki's ring   3. Gastroesophageal reflux disease, unspecified whether esophagitis present   4. Right flank pain   5. Constipation, unspecified constipation type   6. Family history of colonic polyps     History of Present Illness This is the first patient's first visit to the outpatient [facility] clinic in over 5 years.  She was previously evaluated and underwent upper endoscopy with Dr. Deatra Ina.  Back in 2016 she had a stricture found in the distal esophagus consistent with what was likely a Schatzki ring.  This was dilated with a Maloney dilator at the time.  She states she had good effect for the course of the next few years.  However, the patient has now had recurrence of dysphagia symptoms to the solid foods.  No issues with liquids.  She is having to regurgitate food at times.  Patient has acid reflux as well but takes only H2RAs.  Patient has been experiencing some discomfort in her right side.  It is not related to food intake or fasting.  Movement can make the discomfort worse at times.  She has longer standing issues of constipation.  She denies overt hematochezia.  She has a family history of colon polyps in her sister and is overdue for her colonoscopy for high risk screening.  GI Review of Systems Positive as above Negative for odynophagia, nausea,  decreased appetite, early satiety, melena  Review of Systems General: Denies fevers/chills/weight loss unintentionally HEENT: Denies oral lesions Cardiovascular: Denies chest pain/palpitations Pulmonary: Denies shortness of breath Gastroenterological: See HPI Genitourinary: Denies darkened urine Hematological: Denies easy bruising/bleeding Dermatological: Denies jaundice Psychological: Mood is stable   Medications Current Outpatient Medications  Medication Sig Dispense Refill   benazepril (LOTENSIN) 20 MG tablet Take 1 tablet (20 mg total) by mouth daily. 30 tablet 6   clonazePAM (KLONOPIN) 1 MG tablet Take 1 mg by mouth 2 (two) times daily.     cyclobenzaprine (FLEXERIL) 5 MG tablet Take 1 tablet (5 mg total) by mouth 3 (three) times daily as needed for muscle spasms. (Patient not taking: Reported on 02/16/2020) 30 tablet 3   diclofenac Sodium (VOLTAREN) 1 % GEL Apply 2 g topically 4 (four) times daily. (Patient not taking: Reported on 02/16/2020) 100 g 3   DULoxetine (CYMBALTA) 60 MG capsule Take 1 capsule (60 mg total) by mouth 2 (two) times daily. For back pain (Patient not taking: Reported on 02/16/2020) 60 capsule 3   esomeprazole (NEXIUM) 40 MG capsule Take 1 capsule (40 mg total) by mouth daily at 12 noon. 30 capsule 2   gabapentin (NEURONTIN) 300 MG capsule Take 1 capsule (300 mg total) by mouth 3 (three) times daily. (Patient not taking: Reported on 02/16/2020) 90 capsule 0   lidocaine (LIDODERM) 5 % Place 1 patch onto the skin daily. Remove & Discard patch within 12 hours or as directed by MD (Patient not taking: Reported on 02/16/2020) 30 patch 3   methocarbamol (ROBAXIN)  500 MG tablet Take 1 tablet (500 mg total) by mouth every 8 (eight) hours as needed for muscle spasms. (Patient not taking: Reported on 02/16/2020) 60 tablet 6   Na Sulfate-K Sulfate-Mg Sulf (SUPREP BOWEL PREP KIT) 17.5-3.13-1.6 GM/177ML SOLN Take 1 kit by mouth as directed. For colonoscopy prep 354  mL 0   ondansetron (ZOFRAN ODT) 4 MG disintegrating tablet Take 1 tablet (4 mg total) by mouth every 8 (eight) hours as needed for nausea or vomiting. (Patient not taking: Reported on 02/16/2020) 20 tablet 0   tizanidine (ZANAFLEX) 2 MG capsule Take 1 capsule (2 mg total) by mouth at bedtime as needed for muscle spasms. (Patient not taking: Reported on 10/18/2019) 30 capsule 0   No current facility-administered medications for this visit.    Allergies Allergies  Allergen Reactions   Amoxicillin Rash    Has patient had a PCN reaction causing immediate rash, facial/tongue/throat swelling, SOB or lightheadedness with hypotension: yes Has patient had a PCN reaction causing severe rash involving mucus membranes or skin necrosis: no Has patient had a PCN reaction that required hospitalization: no Has patient had a PCN reaction occurring within the last 10 years: no If all of the above answers are "NO", then may proceed with Cephalosporin use.    Penicillins Rash    Histories Past Medical History:  Diagnosis Date   Anxiety    Cancer ([facility])    pt. reports had uterine cancer x 10 years ago   Chest pain 12/08/2013   GERD (gastroesophageal reflux disease) 08/10/2014   Hyperlipidemia    Hypertension    Murmur 12/08/2013   Past Surgical History:  Procedure Laterality Date   ABDOMINAL HYSTERECTOMY     ABDOMINAL SURGERY     COLONOSCOPY     UPPER GASTROINTESTINAL ENDOSCOPY  4/21/216   Social History   Socioeconomic History   Marital status: Legally Separated    Spouse name: Not on file   Number of children: 2   Years of education: Not on file   Highest education level: Some college, no degree  Occupational History   Occupation: Unemployed  Tobacco Use   Smoking status: Never Smoker   Smokeless tobacco: Never Used  Scientific laboratory technician Use: Never used  Substance and Sexual Activity   Alcohol use: Yes    Alcohol/week: 1.0 - 2.0 standard drink    Types: 1 - 2  Cans of beer per week    Comment: a night   Drug use: No   Sexual activity: Yes    Birth control/protection: None  Other Topics Concern   Not on file  Social History Narrative   Not on file   Social Determinants of Health   Financial Resource Strain:    Difficulty of Paying Living Expenses: Not on file  Food Insecurity:    Worried About Charity fundraiser in the Last Year: Not on file   [employer] of Food in the Last Year: Not on file  Transportation Needs: No Transportation Needs   Lack of Transportation (Medical): No   Lack of Transportation (Non-Medical): No  Physical Activity:    Days of Exercise per Week: Not on file   Minutes of Exercise per Session: Not on file  Stress:    Feeling of Stress : Not on file  Social Connections:    Frequency of Communication with Friends and Family: Not on file   Frequency of Social Gatherings with Friends and Family: Not on file   Attends Religious  Services: Not on file   Active Member of Clubs or Organizations: Not on file   Attends Archivist Meetings: Not on file   Marital Status: Not on file  Intimate Partner Violence:    Fear of Current or Ex-Partner: Not on file   Emotionally Abused: Not on file   Physically Abused: Not on file   Sexually Abused: Not on file   Family History  Problem Relation Age of Onset   Gallbladder disease Mother    Heart disease Father    Lung cancer Father    Brain cancer Father    Colon polyps Sister    Diabetes Sister        x3   Diabetes Brother    Breast cancer Paternal Aunt    Colon cancer Neg Hx    Kidney disease Neg Hx    Esophageal cancer Neg Hx    Inflammatory bowel disease Neg Hx    Liver disease Neg Hx    Pancreatic cancer Neg Hx    Rectal cancer Neg Hx    Stomach cancer Neg Hx    I have reviewed her medical, social, and family history in detail and updated the electronic medical record as necessary.    PHYSICAL EXAMINATION  Pulse 67     Ht 5' 3.5" (1.613 m)    Wt 209 lb (94.8 kg)    SpO2 98%    BMI 36.44 kg/m  Wt Readings from Last 3 Encounters:  02/16/20 209 lb (94.8 kg)  01/05/20 204 lb 3.2 oz (92.6 kg)  12/13/19 202 lb (91.6 kg)  GEN: NAD, appears stated age, doesn't appear chronically ill PSYCH: Cooperative, without pressured speech EYE: Conjunctivae pink, sclerae anicteric ENT: MMM, without oral ulcers, no erythema or exudates noted NECK: Supple, enlarged neck girth CV: Nontachycardic RESP: No audible wheezing GI: NABS, soft, protuberant, rounded, NT/ND, without rebound or guarding MSK/EXT: No lower extremity edema SKIN: No jaundice NEURO:  Alert & Oriented x 3, no focal deficits   REVIEW OF DATA  I reviewed the following data at the time of this encounter:  GI Procedures and Studies  April 2016 EGD ENDOSCOPIC IMPRESSION: 1. There was a stricture at the gastroesophageal junction; The stricture was dilated using a 38m (52Fr) Maloney dilator 2. EGD was otherwise normal  September 2012 colonoscopy (outside provider) Performed due to family history of colon polyps in sister Normal colonoscopy up to the terminal ileum  Laboratory Studies  Reviewed those in epic  Imaging Studies  No relevant studies to review   ASSESSMENT  Ms. HOrmistonis a 59y.o. female with a pmh significant for uterine cancer survivor, hypertension, hyperlipidemia, obesity, anxiety, GERD, prior esophageal stricture, family history of colon polyps.  The patient is seen today for evaluation and management of:  1. Dysphagia, unspecified type   2. Schatzki's ring   3. Gastroesophageal reflux disease, unspecified whether esophagitis present   4. Right flank pain   5. Constipation, unspecified constipation type   6. Family history of colonic polyps    The patient is hemodynamically stable.  Clinically however, she seems to have evidence of dysphagia has a prior esophageal ring for Schatzki ring that may require additional  work-up/therapy at this time.  Suspect she is also having symptoms as a result of progressive acid reflux and potentially a progressive hiatal hernia.  Diagnostic endoscopy with potential dilation and biopsies will be performed to further evaluate this.  If patient does not have improvement then consideration of esophageal manometry  and pH impedance testing is already better.  We went over lifestyle modifications for acid reflux to be on the look out for.  We will initiate her on PPI at this time as well.  Regards to her longer standing constipation issues we will initiate FiberCon or Metamucil as well as considering MiraLAX on a daily basis.  Colonoscopy for high risk screening in setting of family history of colon polyps in sister is indicated we will get her up-to-date.  The risks and benefits of endoscopic evaluation were discussed with the patient; these include but are not limited to the risk of perforation, infection, bleeding, missed lesions, lack of diagnosis, severe illness requiring hospitalization, as well as anesthesia and sedation related illnesses.  Etiology of patient's discomfort is not clearly defined as being GI related but we will begin with further work-up via an abdominal ultrasound.  The patient is agreeable to proceed.  All patient questions were answered to the best of my ability, and the patient agrees to the aforementioned plan of action with follow-up as indicated.   PLAN  Laboratories as outlined below Abdominal ultrasound to be obtained Initiate Nexium 40 mg once daily Diagnostic and potentially therapeutic EGD to be scheduled (esophageal/gastric/duodenal biopsies to be obtained) High risk screening colonoscopy to be scheduled due to family history Toileting techniques discussed GERD lifestyle modifications discussed -Eat meals >3 hours before bedtime -Do not lay down or lie flat immediately after eating for at least 2-hours -Do not overeat (decrease portion size and/or eat  more frequent smaller meals) -Eat slowly -Wear Loose-fitting clothes -Raise Head of Bed (Head & Chest > Feet with bed blocks) -Avoid foods that trigger the symptoms (Onions, Chocolate, Caffeine, Spicy, and Fatty) -Work on Losing [employer] -Avoid Alcohol -Increase Exercise Activity -Maintain Heartburn Diary/Log   Orders Placed This Encounter  Procedures   US Abdomen Complete   Comp Met (CMET)   Lipase   Tissue transglutaminase, IgA   IgA   Ambulatory referral to Gastroenterology    New Prescriptions   ESOMEPRAZOLE (NEXIUM) 40 MG CAPSULE    Take 1 capsule (40 mg total) by mouth daily at 12 noon.   NA SULFATE-K SULFATE-MG SULF (SUPREP BOWEL PREP KIT) 17.5-3.13-1.6 GM/177ML SOLN    Take 1 kit by mouth as directed. For colonoscopy prep   Modified Medications   No medications on file    Planned Follow Up No follow-ups on file.   Total Time in Face-to-Face and in Coordination of Care for patient including independent/personal interpretation/review of prior testing, medical history, examination, medication adjustment, communicating results with the patient directly, and documentation with the EHR is 50 minutes.   Justice Britain, MD [facility] Office # [phone]

## 2020-02-16 NOTE — Patient Instructions (Addendum)
START:     MIRALAX DISSOLVE 1 CAPFUL DAILY IN AT LEAST 8 OUNCES       OF WATER DAILY FOR 1 WEEK PRIOR TO PROCEDURE.    We have sent the following medications to your pharmacy for you to pick up at your convenience: Nexium 40mg  once daily and Suprep ( bowel prep)   You may also use FiberCon or Metamucil over the counter to help.-  A high fiber diet with plenty of fluids (up to 8 glasses of water daily) is suggested to relieve these symptoms.  Metamucil, 1 tablespoon once or twice daily can be used to keep bowels regular if needed.  You have been scheduled for an abdominal ultrasound at [facility] (1st floor of hospital) on 02/22/20 at 10:30am. Please arrive 15 minutes prior to your appointment for registration. Make certain not to have anything to eat or drink 6 hours prior to your appointment. Should you need to reschedule your appointment, please contact radiology at [phone]. This test typically takes about 30 minutes to perform.  Your provider has requested that you go to the basement level for lab work before leaving today. Press "B" on the elevator. The lab is located at the first door on the left as you exit the elevator.   Thank you for choosing me and [facility].  Dr. Rush Landmark

## 2020-02-17 LAB — COMPREHENSIVE METABOLIC PANEL
ALT: 21 U/L (ref 0–35)
AST: 21 U/L (ref 0–37)
Albumin: 4.9 g/dL (ref 3.5–5.2)
Alkaline Phosphatase: 62 U/L (ref 39–117)
BUN: 19 mg/dL (ref 6–23)
CO2: 26 mEq/L (ref 19–32)
Calcium: 10.3 mg/dL (ref 8.4–10.5)
Chloride: 103 mEq/L (ref 96–112)
Creatinine, Ser: 0.82 mg/dL (ref 0.40–1.20)
GFR: 78.35 mL/min (ref >60.00)
Glucose, Bld: 87 mg/dL (ref 70–99)
Potassium: 4.3 mEq/L (ref 3.5–5.1)
Sodium: 137 mEq/L (ref 135–145)
Total Bilirubin: 0.3 mg/dL (ref 0.2–1.2)
Total Protein: 7.9 g/dL (ref 6.0–8.3)

## 2020-02-17 LAB — LIPASE: Lipase: 129 U/L — ABNORMAL HIGH (ref 11.0–59.0)

## 2020-02-17 LAB — TISSUE TRANSGLUTAMINASE, IGA: (tTG) Ab, IgA: <1 U/mL

## 2020-02-17 LAB — IGA: Immunoglobulin A: 297 mg/dL (ref 47–310)

## 2020-02-19 ENCOUNTER — Encounter: Payer: Self-pay | Admitting: Gastroenterology

## 2020-02-19 DIAGNOSIS — R1310 Dysphagia, unspecified: Secondary | ICD-10-CM | POA: Insufficient documentation

## 2020-02-19 DIAGNOSIS — K222 Esophageal obstruction: Secondary | ICD-10-CM | POA: Insufficient documentation

## 2020-02-19 DIAGNOSIS — R109 Unspecified abdominal pain: Secondary | ICD-10-CM | POA: Insufficient documentation

## 2020-02-19 DIAGNOSIS — Z8371 Family history of colonic polyps: Secondary | ICD-10-CM | POA: Insufficient documentation

## 2020-02-19 DIAGNOSIS — Z83719 Family history of colon polyps, unspecified: Secondary | ICD-10-CM | POA: Insufficient documentation

## 2020-02-19 DIAGNOSIS — K5900 Constipation, unspecified: Secondary | ICD-10-CM | POA: Insufficient documentation

## 2020-02-20 ENCOUNTER — Telehealth: Payer: Self-pay

## 2020-02-20 DIAGNOSIS — K5900 Constipation, unspecified: Secondary | ICD-10-CM

## 2020-02-20 DIAGNOSIS — K219 Gastro-esophageal reflux disease without esophagitis: Secondary | ICD-10-CM

## 2020-02-20 DIAGNOSIS — R109 Unspecified abdominal pain: Secondary | ICD-10-CM

## 2020-02-20 DIAGNOSIS — R1310 Dysphagia, unspecified: Secondary | ICD-10-CM

## 2020-02-20 NOTE — Telephone Encounter (Signed)
Pt informed. Pt will come in the week of 11/15 to have lab repeated. ( Lipase.) Order has been placed in epic.

## 2020-02-20 NOTE — Telephone Encounter (Signed)
-----   Message from Irving Copas., MD sent at 02/17/2020  2:46 PM EDT ----- Mya Suell,I have released the results to the patient via [place].Please move forward with getting a lipase recheck in approximately 3 to 4 weeks.Thanks.GM

## 2020-02-22 ENCOUNTER — Ambulatory Visit (HOSPITAL_COMMUNITY)
Admission: RE | Admit: 2020-02-22 | Discharge: 2020-02-22 | Disposition: A | Discharge status: Home / Self Care | Payer: Self-pay | Source: Ambulatory Visit | Attending: Gastroenterology | Admitting: Gastroenterology

## 2020-02-22 ENCOUNTER — Other Ambulatory Visit: Payer: Self-pay

## 2020-02-22 DIAGNOSIS — R109 Unspecified abdominal pain: Secondary | ICD-10-CM | POA: Insufficient documentation

## 2020-02-22 DIAGNOSIS — Z8371 Family history of colonic polyps: Secondary | ICD-10-CM | POA: Insufficient documentation

## 2020-02-22 DIAGNOSIS — R1310 Dysphagia, unspecified: Secondary | ICD-10-CM | POA: Insufficient documentation

## 2020-02-22 DIAGNOSIS — K219 Gastro-esophageal reflux disease without esophagitis: Secondary | ICD-10-CM | POA: Insufficient documentation

## 2020-02-22 DIAGNOSIS — K5900 Constipation, unspecified: Secondary | ICD-10-CM | POA: Insufficient documentation

## 2020-02-22 IMAGING — US US ABDOMEN COMPLETE
1 series · 14 of 25 positions shown · non-contrast
Comparison: None.

CLINICAL DATA: Right flank pain

EXAM:
ABDOMEN ULTRASOUND COMPLETE

[Series 1: us abdomen complete · 14 of 76 slices shown]
[im 1/76]
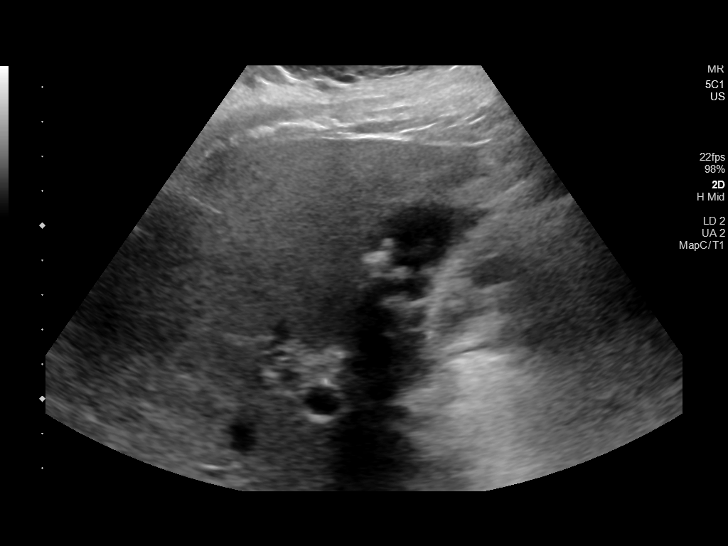
[im 7/76]
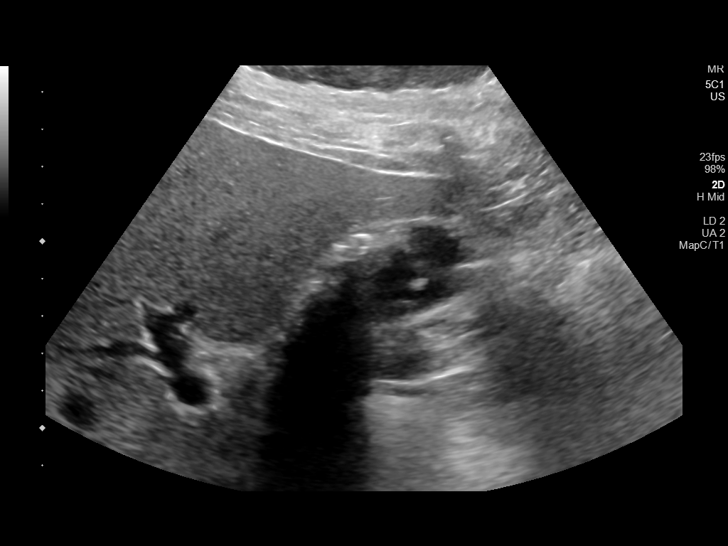
[im 13/76]
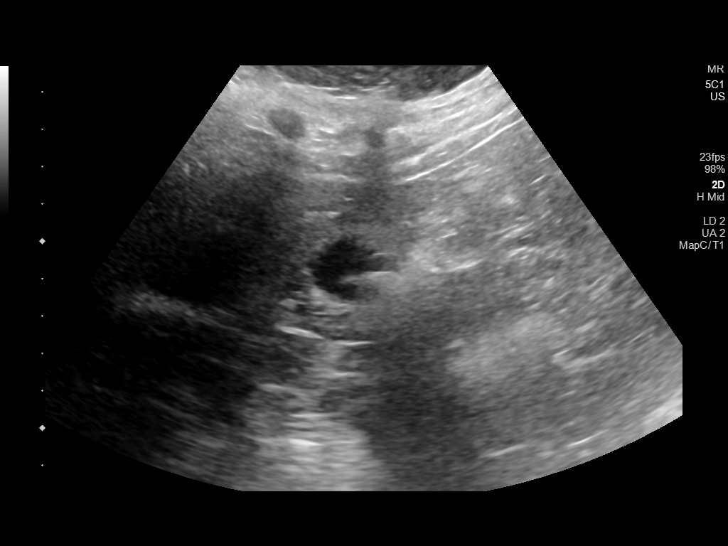
[im 19/76]
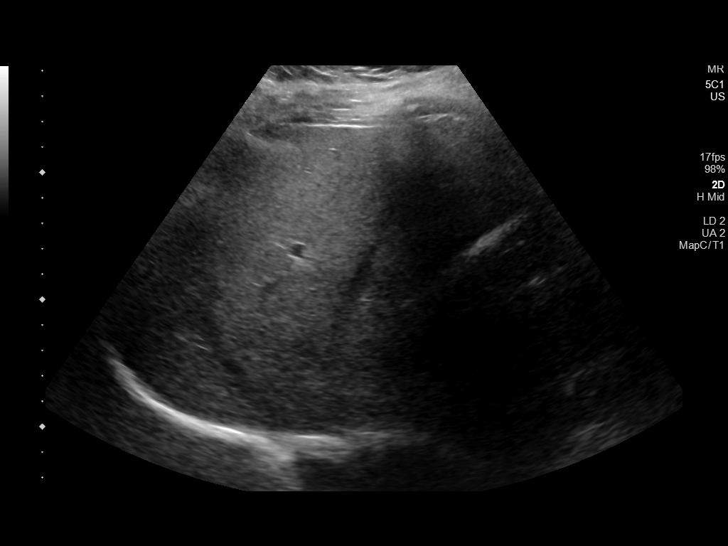
[im 26/76]
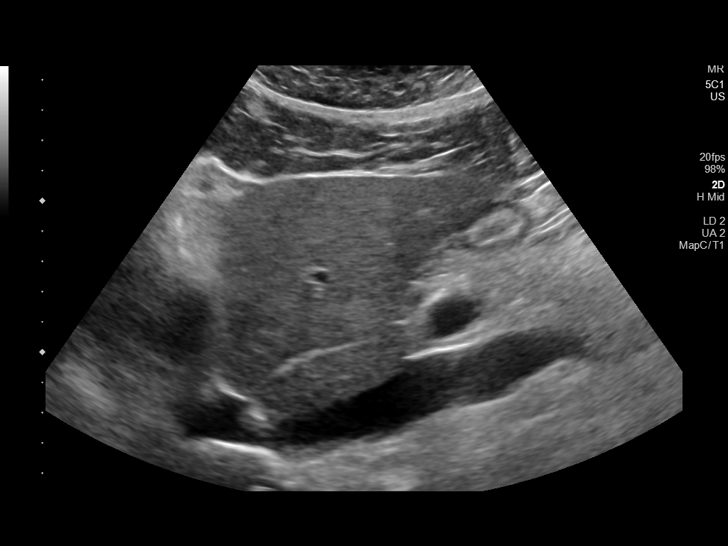
[im 29/76]
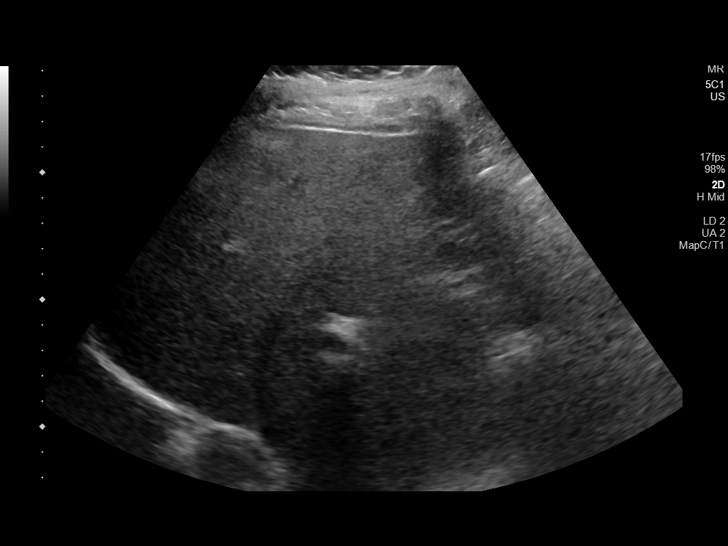
[im 35/76]
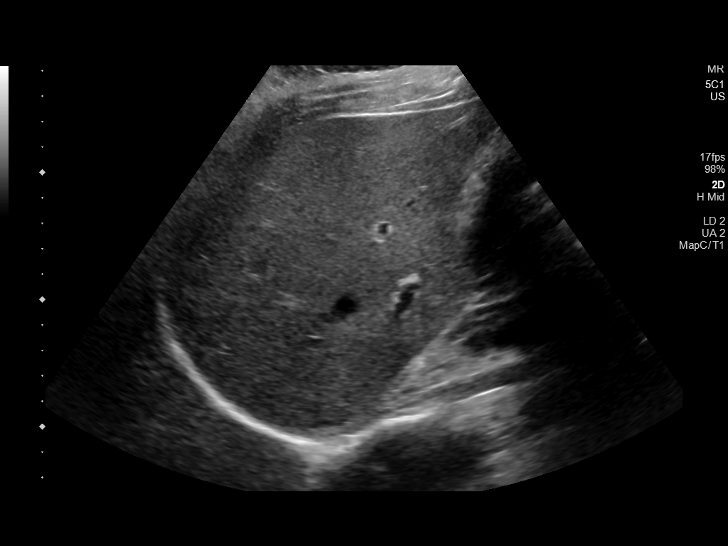
[im 41/76]
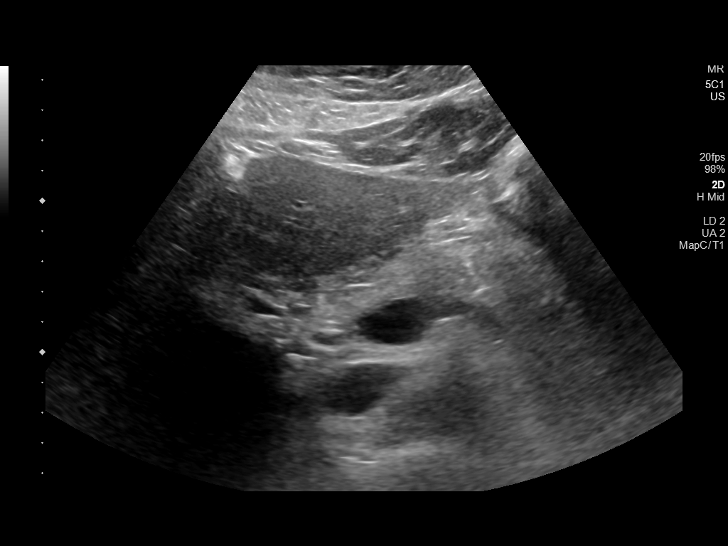
[im 47/76]
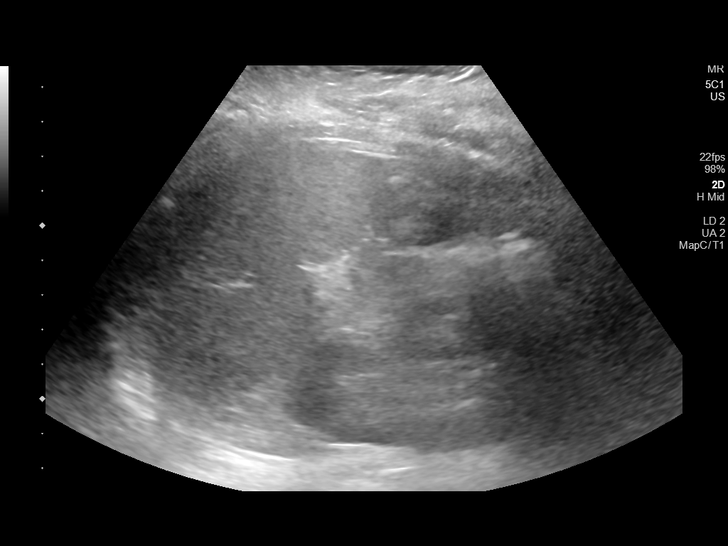
[im 51/76]
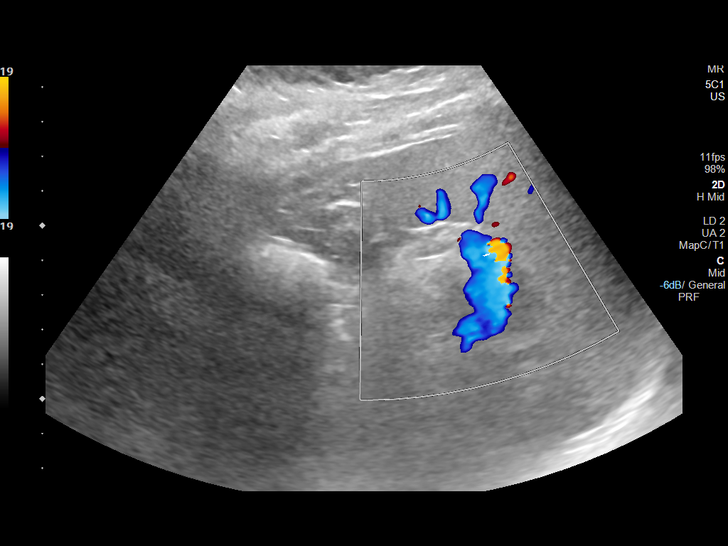
[im 57/76]
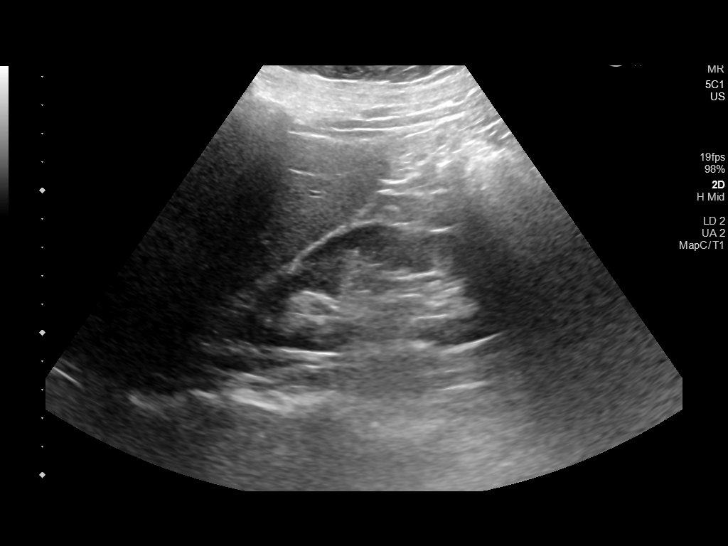
[im 63/76]
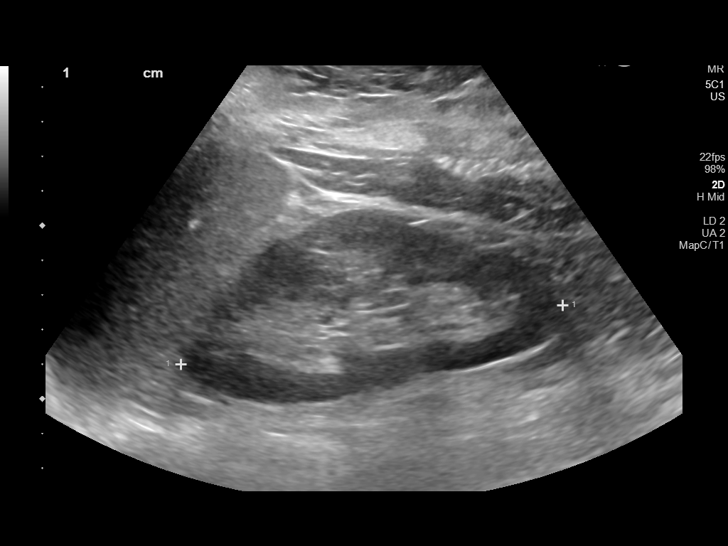
[im 69/76]
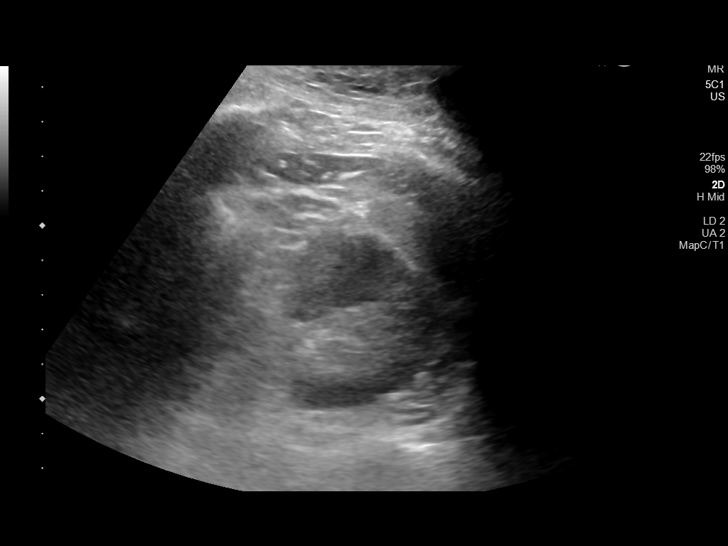
[im 76/76]
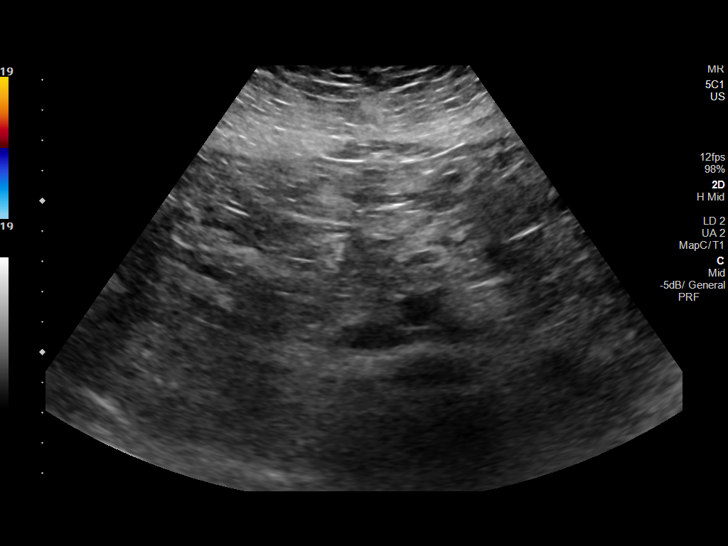

[14 of 25 positions shown; findings below may reference images not displayed]

FINDINGS: Gallbladder: Multiple shadowing stones measuring up to 1.6 cm.
Normal wall thickness. Negative sonographic Murphy.

Common bile duct: Diameter: 4.7 mm

Liver: Liver is slightly echogenic. No focal hepatic abnormality.
Portal vein is patent on color Doppler imaging with normal direction
of blood flow towards the liver.

IVC: No abnormality visualized.

Pancreas: Visualized portion unremarkable.

Spleen: Size and appearance within normal limits.

Right Kidney: Length: 11.8 cm. Echogenicity within normal limits. No
mass or hydronephrosis visualized.

Left Kidney: Length: 11.2 cm. Echogenicity within normal limits. No
mass or hydronephrosis visualized.

Abdominal aorta: No aneurysm visualized.

Other findings: None.
IMPRESSION: 1. Cholelithiasis without sonographic evidence for acute
cholecystitis.
2. Slightly echogenic liver as may be seen with steatosis

## 2020-02-24 ENCOUNTER — Other Ambulatory Visit: Payer: Self-pay

## 2020-02-24 ENCOUNTER — Ambulatory Visit (AMBULATORY_SURGERY_CENTER): Payer: Self-pay | Admitting: Gastroenterology

## 2020-02-24 ENCOUNTER — Encounter: Payer: Self-pay | Admitting: Gastroenterology

## 2020-02-24 ENCOUNTER — Other Ambulatory Visit: Payer: Self-pay | Admitting: Gastroenterology

## 2020-02-24 VITALS — BP 133/58 | HR 69 | Temp 97.0°F | Resp 17 | Ht 63.0 in | Wt 209.0 lb

## 2020-02-24 DIAGNOSIS — D122 Benign neoplasm of ascending colon: Secondary | ICD-10-CM

## 2020-02-24 DIAGNOSIS — K219 Gastro-esophageal reflux disease without esophagitis: Secondary | ICD-10-CM

## 2020-02-24 DIAGNOSIS — R12 Heartburn: Secondary | ICD-10-CM

## 2020-02-24 DIAGNOSIS — D128 Benign neoplasm of rectum: Secondary | ICD-10-CM

## 2020-02-24 DIAGNOSIS — Z8371 Family history of colonic polyps: Secondary | ICD-10-CM

## 2020-02-24 DIAGNOSIS — R1310 Dysphagia, unspecified: Secondary | ICD-10-CM

## 2020-02-24 DIAGNOSIS — K2100 Gastro-esophageal reflux disease with esophagitis, without bleeding: Secondary | ICD-10-CM

## 2020-02-24 DIAGNOSIS — Z1211 Encounter for screening for malignant neoplasm of colon: Secondary | ICD-10-CM

## 2020-02-24 DIAGNOSIS — K222 Esophageal obstruction: Secondary | ICD-10-CM

## 2020-02-24 DIAGNOSIS — R1013 Epigastric pain: Secondary | ICD-10-CM

## 2020-02-24 DIAGNOSIS — R109 Unspecified abdominal pain: Secondary | ICD-10-CM

## 2020-02-24 MED ORDER — ESOMEPRAZOLE MAGNESIUM 40 MG PO PACK: 40.0000 mg | PACK | Freq: Two times a day (BID) | ORAL | 3 refills | Status: DC

## 2020-02-24 MED ORDER — SODIUM CHLORIDE 0.9 % IV SOLN: 500.0000 mL | Freq: Once | INTRAVENOUS | Status: DC

## 2020-02-24 NOTE — Progress Notes (Signed)
Called to room to assist during endoscopic procedure.  Patient ID and intended procedure confirmed with present staff. Received instructions for my participation in the procedure from the performing physician.  

## 2020-02-24 NOTE — Progress Notes (Signed)
0755 Robinul 0.1 mg IV given due large amount of secretions upon assessment.  MD made aware, vss 

## 2020-02-24 NOTE — Op Note (Signed)
[facility] Patient Name: Christina Burke Procedure Date: 02/24/2020 7:55 AM MRN: [id number] Endoscopist: Justice Britain , MD Age: 59 Referring MD:  Date of Birth: Aug 27, 1960 Gender: Female Account #: [id number] Procedure:                Upper GI endoscopy Indications:              Epigastric abdominal pain, Dysphagia, Heartburn,                            Suspected gastro-esophageal reflux disease Medicines:                Monitored Anesthesia Care Procedure:                Pre-Anesthesia Assessment:                           - Prior to the procedure, a History and Physical                            was performed, and patient medications and                            allergies were reviewed. The patient's tolerance of                            previous anesthesia was also reviewed. The risks                            and benefits of the procedure and the sedation                            options and risks were discussed with the patient.                            All questions were answered, and informed consent                            was obtained. Prior Anticoagulants: The patient has                            taken no previous anticoagulant or antiplatelet                            agents. ASA Grade Assessment: II - A patient with                            mild systemic disease. After reviewing the risks                            and benefits, the patient was deemed in                            satisfactory condition to undergo the procedure.  After obtaining informed consent, the endoscope was                            passed under direct vision. Throughout the                            procedure, the patient's blood pressure, pulse, and                            oxygen saturations were monitored continuously. The                            Endoscope was introduced through the mouth, and                            advanced  to the second part of duodenum. The upper                            GI endoscopy was accomplished without difficulty.                            The patient tolerated the procedure. Scope In: Scope Out: Findings:                 No gross lesions were noted in the proximal                            esophagus and in the mid esophagus.                           LA Grade B (one or more mucosal breaks greater than                            5 mm, not extending between the tops of two mucosal                            folds) esophagitis with no bleeding was found in                            the distal esophagus and GE Junction.                           One benign-appearing, intrinsic mild                            (non-circumferential scarring) stenosis was found                            40 cm from the incisors. This stenosis measured 1.4                            cm (inner diameter) x less than one cm (in length).  The stenosis was traversed.                           Biopsies were taken with a cold forceps in the                            entire esophagus for histology to rule out EoE/LoE.                            After the rest of the EGD was complete, a guidewire                            was placed and the scope was withdrawn. Dilation                            was performed in the entire esophagus with a Savary                            dilator with no resistance at 14 mm and 16 mm and                            mild resistance at 18 mm. The dilation site was                            examined following endoscope reinsertion and showed                            mild mucosal disruption, mild improvement in                            luminal narrowing and no perforation.                           Striped mildly erythematous mucosa without bleeding                            was found in the gastric body and in the gastric                             antrum.                           No other gross lesions were noted in the entire                            examined stomach. Biopsies were taken with a cold                            forceps for histology and Helicobacter pylori                            testing.  No gross lesions were noted in the duodenal bulb,                            in the first portion of the duodenum and in the                            second portion of the duodenum. Biopsies were taken                            with a cold forceps for histology. Complications:            No immediate complications. Estimated Blood Loss:     Estimated blood loss was minimal. Impression:               - No gross lesions in esophagus proximally. LA                            Grade B esophagitis with no bleeding distally and                            at GE Jxn. Benign-appearing esophageal stenosis at                            GE Jxn - Dilated. Esophageal biopsies obtained.                           - Erythematous mucosa in the gastric body and                            antrum. No other gross lesions in the stomach.                            Biopsied.                           - No gross lesions in the duodenal bulb, in the                            first portion of the duodenum and in the second                            portion of the duodenum. Biopsied. Recommendation:           - Proceed to scheduled colonoscopy.                           - Observe patient's clinical course.                           - Continue present medications.                           - Repeat upper endoscopy in 3 months to check  healing and see if additional therapies needed.                           - If dysphagia persists prior to next endoscopy,                            then may consider moving forward with esophageal                            manometry.                           -  The findings and recommendations were discussed                            with the patient. Justice Britain, MD 02/24/2020 8:49:19 AM

## 2020-02-24 NOTE — Patient Instructions (Addendum)
YOU HAD AN ENDOSCOPIC PROCEDURE TODAY AT [facility]:   Refer to the procedure report that was given to you for any specific questions about what was found during the examination.  If the procedure report does not answer your questions, please call your gastroenterologist to clarify.  If you requested that your care partner not be given the details of your procedure findings, then the procedure report has been included in a sealed envelope for you to review at your convenience later.  YOU SHOULD EXPECT: Some feelings of bloating in the abdomen. Passage of more gas than usual.  Walking can help get rid of the air that was put into your GI tract during the procedure and reduce the bloating. If you had a lower endoscopy (such as a colonoscopy or flexible sigmoidoscopy) you may notice spotting of blood in your stool or on the toilet paper. If you underwent a bowel prep for your procedure, you may not have a normal bowel movement for a few days.  Please Note:  You might notice some irritation and congestion in your nose or some drainage.  This is from the oxygen used during your procedure.  There is no need for concern and it should clear up in a day or so.  SYMPTOMS TO REPORT IMMEDIATELY:   Following lower endoscopy (colonoscopy or flexible sigmoidoscopy):  Excessive amounts of blood in the stool  Significant tenderness or worsening of abdominal pains  Swelling of the abdomen that is new, acute  Fever of 100F or higher   Following upper endoscopy (EGD)  Vomiting of blood or coffee ground material  New chest pain or pain under the shoulder blades  Painful or persistently difficult swallowing  New shortness of breath  Fever of 100F or higher  Black, tarry-looking stools  For urgent or emergent issues, a gastroenterologist can be reached at any hour by calling [phone]. Do not use MyChart messaging for urgent concerns.    DIET:  Follow a Post-Dilation diet (see handout  given to you by your recovery nurse): Nothing by mouth until 0930, Clear Liquids only from 0930am to 1030am, then a Soft diet for the rest of the day today starting at 1030am. You may proceed to your regular diet as tolerated tomorrow morning.  Drink plenty of fluids but you should avoid alcoholic beverages for 24 hours. Follow a High Fiber Diet.  MEDICATIONS: Continue present medications. Use Fibercon 1-2 tablets by mouth daily. Use Nexium 40 mg by mouth twice daily (RX has been electronically sent to your pharmacy of choice).  Please see handouts given to you by your recovery nurse.  Follow up: Repeat Endoscopy in 3 months to check healing and see if additional therapies are needed. Dr. Donneta Romberg office nurse will call you to make this appointment. Call Dr. Rush Landmark if your dysphagia (difficulty swallowing) persists prior to your next endoscopy as he may want to do esophageal manometry.  Dr. Rush Landmark will see you prior to the next endoscopy in his office for a follow up appointment to be made after the pathology results come back.  ACTIVITY:  You should plan to take it easy for the rest of today and you should NOT DRIVE or use heavy machinery until tomorrow (because of the sedation medicines used during the test).    FOLLOW UP: Our staff will call the number listed on your records 48-72 hours following your procedure to check on you and address any questions or concerns that you may have regarding the  information given to you following your procedure. If we do not reach you, we will leave a message.  We will attempt to reach you two times.  During this call, we will ask if you have developed any symptoms of COVID 19. If you develop any symptoms (ie: fever, flu-like symptoms, shortness of breath, cough etc.) before then, please call [phone].  If you test positive for Covid 19 in the 2 weeks post procedure, please call and report this information to [place].    If any biopsies were taken you  will be contacted by phone or by letter within the next 1-3 weeks.  Please call us at [phone] if you have not heard about the biopsies in 3 weeks.   Thank you for allowing [place] to provide for your healthcare needs today.   SIGNATURES/CONFIDENTIALITY: You and/or your care partner have signed paperwork which will be entered into your electronic medical record.  These signatures attest to the fact that that the information above on your After Visit Summary has been reviewed and is understood.  Full responsibility of the confidentiality of this discharge information lies with you and/or your care-partner.

## 2020-02-24 NOTE — Progress Notes (Signed)
Vital signs checked by:CW ? ?The medical and surgical history was reviewed and verified with the patient. ? ?

## 2020-02-24 NOTE — Op Note (Addendum)
[place] Patient Name: Christina Burke Procedure Date: 02/24/2020 7:54 AM MRN: [id number] Endoscopist: Justice Britain , MD Age: 59 Referring MD:  Date of Birth: Jul 20, 1960 Gender: Female Account #: [id number] Procedure:                Colonoscopy Indications:              Colon cancer screening in patient at increased                            risk: Family history of 1st-degree relative with                            colon polyps before age 66 years Medicines:                Monitored Anesthesia Care Procedure:                Pre-Anesthesia Assessment:                           - Prior to the procedure, a History and Physical                            was performed, and patient medications and                            allergies were reviewed. The patient's tolerance of                            previous anesthesia was also reviewed. The risks                            and benefits of the procedure and the sedation                            options and risks were discussed with the patient.                            All questions were answered, and informed consent                            was obtained. Prior Anticoagulants: The patient has                            taken no previous anticoagulant or antiplatelet                            agents. ASA Grade Assessment: II - A patient with                            mild systemic disease. After reviewing the risks                            and benefits, the patient was deemed in  satisfactory condition to undergo the procedure.                           After obtaining informed consent, the colonoscope                            was passed under direct vision. Throughout the                            procedure, the patient's blood pressure, pulse, and                            oxygen saturations were monitored continuously. The                            Colonoscope was  introduced through the anus and                            advanced to the 5 cm into the ileum. The                            colonoscopy was performed without difficulty. The                            patient tolerated the procedure. The quality of the                            bowel preparation was good. The terminal ileum,                            ileocecal valve, appendiceal orifice, and rectum                            were photographed. Scope In: 8:22:10 AM Scope Out: 8:34:47 AM Scope Withdrawal Time: 0 hours 9 minutes 31 seconds  Total Procedure Duration: 0 hours 12 minutes 37 seconds  Findings:                 The digital rectal exam findings include                            hemorrhoids. Pertinent negatives include no                            palpable rectal lesions.                           The terminal ileum and ileocecal valve appeared                            normal.                           A 4 mm polyp was found in the ascending colon. The  polyp was sessile. The polyp was removed with a                            cold snare. Resection and retrieval were complete.                           Two sessile polyps were found in the rectum. The                            polyps were 2 to 3 mm in size. These polyps were                            removed with a cold snare. Resection and retrieval                            were complete.                           Normal mucosa was found in the entire colon                            otherwise.                           Non-bleeding non-thrombosed internal hemorrhoids                            were found during retroflexion, during perianal                            exam and during digital exam. The hemorrhoids were                            Grade II (internal hemorrhoids that prolapse but                            reduce spontaneously). Complications:            No immediate  complications. Estimated Blood Loss:     Estimated blood loss was minimal. Impression:               - Hemorrhoids found on digital rectal exam.                           - The examined portion of the ileum was normal.                           - One 4 mm polyp in the ascending colon, removed                            with a cold snare. Resected and retrieved.                           - Two 2 to 3 mm polyps in the rectum, removed with  a cold snare. Resected and retrieved.                           - Normal mucosa in the entire examined colon                            otherwise.                           - Non-bleeding non-thrombosed internal hemorrhoids. Recommendation:           - The patient will be observed post-procedure,                            until all discharge criteria are met.                           - Discharge patient to home.                           - Patient has a contact number available for                            emergencies. The signs and symptoms of potential                            delayed complications were discussed with the                            patient. Return to normal activities tomorrow.                            Written discharge instructions were provided to the                            patient.                           - High fiber diet.                           - Use FiberCon 1-2 tablets PO daily.                           - Continue present medications.                           - Await pathology results.                           - Repeat colonoscopy in 5 years for surveillance no                            matter pathology due to family history.                           - The findings and recommendations were  discussed                            with the patient. Justice Britain, MD 02/24/2020 8:52:27 AM

## 2020-02-24 NOTE — Progress Notes (Signed)
Report given to PACU, vss 

## 2020-02-27 ENCOUNTER — Emergency Department (INDEPENDENT_AMBULATORY_CARE_PROVIDER_SITE_OTHER)
Admission: EM | Admit: 2020-02-27 | Discharge: 2020-02-27 | Disposition: A | Discharge status: Home / Self Care | Payer: Self-pay | Source: Home / Self Care

## 2020-02-27 ENCOUNTER — Other Ambulatory Visit: Payer: Self-pay | Admitting: Emergency Medicine

## 2020-02-27 ENCOUNTER — Other Ambulatory Visit: Payer: Self-pay

## 2020-02-27 DIAGNOSIS — H8110 Benign paroxysmal vertigo, unspecified ear: Secondary | ICD-10-CM

## 2020-02-27 MED ORDER — MECLIZINE HCL 25 MG PO TABS: 25.0000 mg | ORAL_TABLET | Freq: Three times a day (TID) | ORAL | 0 refills | Status: DC | PRN

## 2020-02-27 NOTE — ED Notes (Signed)
Pt had dizziness when went from lying to sitting only

## 2020-02-27 NOTE — Discharge Instructions (Addendum)
  Antivert (meclizine) is a medication to help with dizziness and nausea related to vertigo.  This medication can cause drowsiness. Do not operate heavy machinery or drive while taking.   Call to schedule a follow up appointment with primary care in 2-3 days if not improving.   Call 911 or have someone drive you to the hospital if symptoms significantly worsening- worsening dizziness, headache, change in vision, balance, weakness or numbness in arms or legs or other new concerning symptoms develop.

## 2020-02-27 NOTE — ED Triage Notes (Addendum)
Pt c/o dizziness x 4 days-denies CP and HA-denies fever/flu sx-NAD-steady gait

## 2020-02-27 NOTE — ED Provider Notes (Signed)
[facility]    CSN: [id number] Arrival date & time: 02/27/20  1532      History   Chief Complaint Chief Complaint  Patient presents with  . Dizziness    HPI Christina Burke is a 59 y.o. female.   HPI  Christina Burke is a 59 y.o. female presenting to UC with c/o intermittent dizziness described as "room spinning." hx of vertigo in the past but that only lasted about 1 day.  Dizziness is worse in the morning moving her head or first sitting up.  Denies HA, chest pain, n/v/d. Denies weakness or numbness in arms or legs. No change in vision. No recent illness or stress. No medications tried PTA.   Pt c/o generalized back pain but reports hx of same. Denies taking any tylenol or motrin. No recent injury.    Past Medical History:  Diagnosis Date  . Anxiety   . Cancer ([place])    pt. reports had uterine cancer x 10 years ago  . Chest pain 12/08/2013  . GERD (gastroesophageal reflux disease) 08/10/2014  . Hyperlipidemia   . Hypertension   . Murmur 12/08/2013    Patient Active Problem List   Diagnosis Date Noted  . Dysphagia 02/19/2020  . Schatzki's ring 02/19/2020  . Right flank pain 02/19/2020  . Constipation 02/19/2020  . Family history of colonic polyps 02/19/2020  . Spinal stenosis of cervical region 10/18/2019  . 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 Vitamin D deficiency 03/23/2018  . Hypertension   . Anxiety   . Hyperlipidemia   . GERD (gastroesophageal reflux disease) 08/10/2014  . HTN (hypertension) 01/25/2014  . Chest pain 12/08/2013  . Murmur 12/08/2013    Past Surgical History:  Procedure Laterality Date  . ABDOMINAL HYSTERECTOMY    . ABDOMINAL SURGERY    . COLONOSCOPY    . UPPER GASTROINTESTINAL ENDOSCOPY  4/21/216    OB History    Gravida  3   Para      Term      Preterm      AB  1   Living  2     SAB  1   TAB      Ectopic      Multiple      Live Births  2            Home  Medications    Prior to Admission medications   Medication Sig Start Date End Date Taking? Authorizing Provider  benazepril (LOTENSIN) 20 MG tablet Take 1 tablet (20 mg total) by mouth daily. 12/13/19   Charlott Rakes, MD  clonazePAM (KLONOPIN) 1 MG tablet Take 1 mg by mouth 2 (two) times daily.    [provider]  cyclobenzaprine (FLEXERIL) 5 MG tablet Take 1 tablet (5 mg total) by mouth 3 (three) times daily as needed for muscle spasms. Patient not taking: Reported on 02/16/2020 10/12/19   Izora Ribas, MD  diclofenac Sodium (VOLTAREN) 1 % GEL Apply 2 g topically 4 (four) times daily. 12/13/19   Charlott Rakes, MD  DULoxetine (CYMBALTA) 60 MG capsule Take 1 capsule (60 mg total) by mouth 2 (two) times daily. For back pain Patient not taking: Reported on 02/16/2020 06/28/19   Izora Ribas, MD  gabapentin (NEURONTIN) 300 MG capsule Take 1 capsule (300 mg total) by mouth 3 (three) times daily. Patient not taking: Reported on 02/16/2020 08/02/19   Izora Ribas, MD  lidocaine (LIDODERM) 5 % Place 1 patch onto the  skin daily. Remove & Discard patch within 12 hours or as directed by MD Patient not taking: Reported on 02/16/2020 12/13/19   Charlott Rakes, MD  meclizine (ANTIVERT) 25 MG tablet Take 1 tablet (25 mg total) by mouth 3 (three) times daily as needed for dizziness. 02/27/20   Noe Gens, PA-C  methocarbamol (ROBAXIN) 500 MG tablet Take 1 tablet (500 mg total) by mouth every 8 (eight) hours as needed for muscle spasms. Patient not taking: Reported on 02/16/2020 05/04/19   Charlott Rakes, MD  NEXIUM 40 MG packet TAKE 40 MG BY MOUTH 2 (TWO) TIMES DAILY. 02/24/20   Mansouraty, Telford Nab., MD  ondansetron (ZOFRAN ODT) 4 MG disintegrating tablet Take 1 tablet (4 mg total) by mouth every 8 (eight) hours as needed for nausea or vomiting. Patient not taking: Reported on 02/16/2020 04/20/19   Deno Etienne, DO  tizanidine (ZANAFLEX) 2 MG capsule Take 1 capsule (2 mg total) by  mouth at bedtime as needed for muscle spasms. Patient not taking: Reported on 10/18/2019 09/13/19   Izora Ribas, MD    Family History Family History  Problem Relation Age of Onset  . Gallbladder disease Mother   . Heart disease Father   . Lung cancer Father   . Brain cancer Father   . Colon polyps Sister   . Diabetes Sister        x3  . Diabetes Brother   . Breast cancer Paternal Aunt   . Colon cancer Neg Hx   . Kidney disease Neg Hx   . Esophageal cancer Neg Hx   . Inflammatory bowel disease Neg Hx   . Liver disease Neg Hx   . Pancreatic cancer Neg Hx   . Rectal cancer Neg Hx   . Stomach cancer Neg Hx     Social History Social History   Tobacco Use  . Smoking status: Never Smoker  . Smokeless tobacco: Never Used  Vaping Use  . Vaping Use: Never used  Substance Use Topics  . Alcohol use: Yes    Alcohol/week: 1.0 - 2.0 standard drink    Types: 1 - 2 Cans of beer per week    Comment: a night  . Drug use: No     Allergies   Amoxicillin and Penicillins   Review of Systems Review of Systems  Constitutional: Negative for chills and fever.  Eyes: Negative for pain and visual disturbance.  Cardiovascular: Negative for chest pain and palpitations.  Musculoskeletal: Negative for neck pain and neck stiffness.  Neurological: Positive for dizziness. Negative for syncope, facial asymmetry, speech difficulty, weakness, light-headedness, numbness and headaches.     Physical Exam Triage Vital Signs ED Triage Vitals  Enc Vitals Group     BP 02/27/20 1601 (!) 168/87     Pulse Rate 02/27/20 1601 77     Resp 02/27/20 1601 16     Temp 02/27/20 1601 98.3 F (36.8 C)     Temp Source 02/27/20 1601 Oral     SpO2 02/27/20 1601 97 %     Weight 02/27/20 1557 212 lb (96.2 kg)     Height 02/27/20 1557 5\' 3"  (1.6 m)     Head Circumference --      Peak Flow --      Pain Score 02/27/20 1556 6     Pain Loc --      Pain Edu? --      Excl. in GC? --    Orthostatic VS for  the past 24  hrs:  BP- Lying Pulse- Lying BP- Sitting Pulse- Sitting BP- Standing at 0 minutes Pulse- Standing at 0 minutes  02/27/20 1659 138/76 72 154/89 67 (!) 155/94 73    Updated Vital Signs BP (!) 168/87 (BP Location: Right Arm)   Pulse 77   Temp 98.3 F (36.8 C) (Oral)   Resp 16   Ht 5\' 3"  (1.6 m)   Wt 212 lb (96.2 kg)   SpO2 97%   BMI 37.55 kg/m   Visual Acuity Right Eye Distance:   Left Eye Distance:   Bilateral Distance:    Right Eye Near:   Left Eye Near:    Bilateral Near:     Physical Exam Vitals and nursing note reviewed.  Constitutional:      General: She is not in acute distress.    Appearance: Normal appearance. She is well-developed. She is not ill-appearing, toxic-appearing or diaphoretic.  HENT:     Head: Normocephalic and atraumatic.     Right Ear: Tympanic membrane and ear canal normal.     Left Ear: Ear canal normal.     Nose: Nose normal.     Mouth/Throat:     Mouth: Mucous membranes are moist.     Pharynx: Oropharynx is clear.  Eyes:     General: No scleral icterus.       Right eye: No discharge.        Left eye: No discharge.     Extraocular Movements: Extraocular movements intact.     Conjunctiva/sclera: Conjunctivae normal.     Pupils: Pupils are equal, round, and reactive to light.  Cardiovascular:     Rate and Rhythm: Normal rate and regular rhythm.     Heart sounds: Murmur heard.   Pulmonary:     Effort: Pulmonary effort is normal. No respiratory distress.     Breath sounds: Normal breath sounds.  Musculoskeletal:        General: Normal range of motion.     Cervical back: Normal range of motion and neck supple. No rigidity or tenderness.  Skin:    General: Skin is warm and dry.     Capillary Refill: Capillary refill takes less than 2 seconds.  Neurological:     General: No focal deficit present.     Mental Status: She is alert and oriented to person, place, and time.     Cranial Nerves: No cranial nerve deficit.     Sensory:  No sensory deficit.     Motor: No weakness.     Coordination: Coordination normal.     Gait: Gait normal.     Deep Tendon Reflexes: Reflexes normal.  Psychiatric:        Mood and Affect: Mood normal.        Behavior: Behavior normal.      UC Treatments / Results  Labs (all labs ordered are listed, but only abnormal results are displayed) Labs Reviewed - No data to display  EKG   Radiology No results found.  Procedures Procedures (including critical care time)  Medications Ordered in UC Medications - No data to display  Initial Impression / Assessment and Plan / UC Course  I have reviewed the triage vital signs and the nursing notes.  Pertinent labs & imaging results that were available during my care of the patient were reviewed by me and considered in my medical decision making (see chart for details).     Orthostatic vitals: WNL but pt does reports brief episodes of dizziness with position  changes. Normal neuro exam. BP elevated, hx of HTN Recommend trial of meclizine, pt's pharmacy closed today, offered to send elsewhere, pt states she plans to pick up in the morning. Encouraged f/u with PCP this week if not improving, pt has appointment scheduled for Thursday, 03/01/20 Discussed symptoms that warrant emergent care in the ED. AVS given  Final Clinical Impressions(s) / UC Diagnoses   Final diagnoses:  Benign paroxysmal positional vertigo, unspecified laterality     Discharge Instructions      Antivert (meclizine) is a medication to help with dizziness and nausea related to vertigo.  This medication can cause drowsiness. Do not operate heavy machinery or drive while taking.   Call to schedule a follow up appointment with primary care in 2-3 days if not improving.   Call 911 or have someone drive you to the hospital if symptoms significantly worsening- worsening dizziness, headache, change in vision, balance, weakness or numbness in arms or legs or other new  concerning symptoms develop.      ED Prescriptions    Medication Sig Dispense Auth. Provider   meclizine (ANTIVERT) 25 MG tablet Take 1 tablet (25 mg total) by mouth 3 (three) times daily as needed for dizziness. 30 tablet Noe Gens, [place]     PDMP not reviewed this encounter.   Noe Gens, [place] 02/27/20 [phone]

## 2020-02-28 ENCOUNTER — Telehealth: Payer: Self-pay | Admitting: Gastroenterology

## 2020-02-28 ENCOUNTER — Telehealth: Payer: Self-pay

## 2020-02-28 MED FILL — MECLIZINE 25 MG TABLET: 25 | 10 days supply | Qty: 30 | Fill #0

## 2020-02-28 NOTE — Telephone Encounter (Signed)
Patient calling for US results

## 2020-02-28 NOTE — Telephone Encounter (Signed)
The pt has been advised that her [place] has not been reviewed by the doc. She was advised that we will contact her as soon as resulted and reviewed.  The pt has been advised of the information and verbalized understanding.

## 2020-02-28 NOTE — Telephone Encounter (Signed)
  Follow up Call-  Call back number 02/24/2020  Post procedure Call Back phone  # [phone]  Permission to leave phone message Yes  Some recent data might be hidden     Patient questions:  Do you have a fever, pain , or abdominal swelling? No. Pain Score  0 *  Have you tolerated food without any problems? Yes.    Have you been able to return to your normal activities? Yes.    Do you have any questions about your discharge instructions: Diet   No. Medications  No. Follow up visit  No.  Do you have questions or concerns about your Care? No.  Actions: * If pain score is 4 or above: 1. No action needed, pain <4.Have you developed a fever since your procedure? no  2.   Have you had an respiratory symptoms (SOB or cough) since your procedure? no  3.   Have you tested positive for COVID 19 since your procedure no  4.   Have you had any family members/close contacts diagnosed with the COVID 19 since your procedure?  no   If yes to any of these questions please route to Joylene John, RN and Joella Prince, RN

## 2020-03-02 ENCOUNTER — Encounter: Payer: Self-pay | Admitting: Gastroenterology

## 2020-03-07 NOTE — Progress Notes (Signed)
Patient ID: Christina Burke, female   DOB: [birth year], 59 y.o.   MRN: [id number]     Christina Burke, is a 59 y.o. female  NGE:[id number]  MWN:[id number]  DOB - [birth year]  Subjective:  Chief Complaint and HPI: Christina Burke is a 59 y.o. female here today to establish care and for a follow up visit Seen in ED 02/27/2020 for dizziness.  She has been having this for about 2 months.  She does admit to some sinus congestion.  No N/V/D.  Dizziness/room spinning usu occurs when she is at rest and worsens when she changes positions.  It improves with getting up and moving around.  ED w/up was unremarkable.  No CP.  No SOB.  No vision changes.    From A/P: Pertinent labs & imaging results that were available during my care of the patient were reviewed by me and considered in my medical decision making (see chart for details).   Orthostatic vitals: WNL but pt does reports brief episodes of dizziness with position changes. Normal neuro exam. BP elevated, hx of HTN Recommend trial of meclizine, pt's pharmacy closed today, offered to send elsewhere, pt states she plans to pick up in the morning. Encouraged f/u with PCP this week if not improving, pt has appointment scheduled for Thursday, 03/01/20 Discussed symptoms that warrant emergent care in the ED. AVS given  ED/Hospital notes reviewed.     ROS:   Constitutional:  No f/c, No night sweats, No unexplained weight loss. EENT:  No vision changes, No blurry vision, No hearing changes. No mouth, throat, or ear problems.  Respiratory: No cough, No SOB Cardiac: No CP, no palpitations GI:  No abd pain, No N/V/D. GU: No Urinary s/sx Musculoskeletal: No joint pain Neuro: No headache, + dizziness, no motor weakness.  Skin: No rash Endocrine:  No polydipsia. No polyuria.  Psych: Denies SI/HI  No problems updated.  ALLERGIES: Allergies  Allergen Reactions   Amoxicillin Rash    Has patient had a PCN reaction causing immediate rash,  facial/tongue/throat swelling, SOB or lightheadedness with hypotension: yes Has patient had a PCN reaction causing severe rash involving mucus membranes or skin necrosis: no Has patient had a PCN reaction that required hospitalization: no Has patient had a PCN reaction occurring within the last 10 years: no If all of the above answers are "NO", then may proceed with Cephalosporin use.    Penicillins Rash    PAST MEDICAL HISTORY: Past Medical History:  Diagnosis Date   Anxiety    Cancer ([place])    pt. reports had uterine cancer x 10 years ago   Chest pain 12/08/2013   GERD (gastroesophageal reflux disease) 08/10/2014   Hyperlipidemia    Hypertension    Murmur 12/08/2013    MEDICATIONS AT HOME: Prior to Admission medications   Medication Sig Start Date End Date Taking? Authorizing Provider  benazepril (LOTENSIN) 20 MG tablet Take 1 tablet (20 mg total) by mouth daily. 12/13/19  Yes Newlin, Charlane Ferretti, MD  clonazePAM (KLONOPIN) 1 MG tablet Take 1 mg by mouth 2 (two) times daily.   Yes [provider]  diclofenac Sodium (VOLTAREN) 1 % GEL Apply 2 g topically 4 (four) times daily. 12/13/19  Yes Charlott Rakes, MD  meclizine (ANTIVERT) 25 MG tablet Take 1 tablet (25 mg total) by mouth 3 (three) times daily as needed for dizziness. 02/27/20  Yes Phelps, Erin O, PA-C  NEXIUM 40 MG packet TAKE 40 MG BY MOUTH 2 (TWO) TIMES DAILY. 02/24/20  Yes Mansouraty,  Telford Nab., MD  cyclobenzaprine (FLEXERIL) 5 MG tablet Take 1 tablet (5 mg total) by mouth 3 (three) times daily as needed for muscle spasms. Patient not taking: Reported on 02/16/2020 10/12/19   Raulkar, Clide Deutscher, MD  DULoxetine (CYMBALTA) 60 MG capsule Take 1 capsule (60 mg total) by mouth 2 (two) times daily. For back pain Patient not taking: Reported on 02/16/2020 06/28/19   Izora Ribas, MD  fluticasone (FLONASE) 50 MCG/ACT nasal spray Place 2 sprays into both nostrils daily. 03/08/20   Argentina Donovan, PA-C  gabapentin  (NEURONTIN) 300 MG capsule Take 1 capsule (300 mg total) by mouth 3 (three) times daily. Patient not taking: Reported on 02/16/2020 08/02/19   Izora Ribas, MD  lidocaine (LIDODERM) 5 % Place 1 patch onto the skin daily. Remove & Discard patch within 12 hours or as directed by MD Patient not taking: Reported on 02/16/2020 12/13/19   Charlott Rakes, MD  methocarbamol (ROBAXIN) 500 MG tablet Take 1 tablet (500 mg total) by mouth every 8 (eight) hours as needed for muscle spasms. Patient not taking: Reported on 02/16/2020 05/04/19   Charlott Rakes, MD  ondansetron (ZOFRAN ODT) 4 MG disintegrating tablet Take 1 tablet (4 mg total) by mouth every 8 (eight) hours as needed for nausea or vomiting. Patient not taking: Reported on 02/16/2020 04/20/19   Deno Etienne, DO  tizanidine (ZANAFLEX) 2 MG capsule Take 1 capsule (2 mg total) by mouth at bedtime as needed for muscle spasms. Patient not taking: Reported on 10/18/2019 09/13/19   Izora Ribas, MD     Objective:  EXAM:   Vitals:   03/08/20 1422  BP: 137/82  Pulse: 80  Temp: (!) 96.6 F (35.9 C)  TempSrc: Temporal  SpO2: 99%  Weight: 216 lb (98 kg)  Height: 5' 3.5" (1.613 m)    General appearance : A&OX3. NAD. Non-toxic-appearing HEENT: Atraumatic and Normocephalic.  PERRLA. EOM intact.  TM clear B. Mouth-MMM, post pharynx WNL w/o erythema, No PND. Neck: supple, no JVD. No cervical lymphadenopathy. No thyromegaly Chest/Lungs:  Breathing-non-labored, Good air entry bilaterally, breath sounds normal without rales, rhonchi, or wheezing  CVS: S1 S2 regular, no murmurs, gallops, rubs . Extremities: Bilateral Lower Ext shows no edema, both legs are warm to touch with = pulse throughout Neurology:  CN II-XII grossly intact, Non focal.  Heel to shin WNL.  Neg rhomberg.   Psych:  TP linear. J/I WNL. Normal speech. Appropriate eye contact and affect.  Skin:  No Rash  Data Review Lab Results  Component Value Date   HGBA1C 5.6 (A)  11/23/2018   HGBA1C 5.2 11/18/2017     Assessment & Plan   1. Vertigo No red flags.  Encouraged and gave info on Epley maneuvers. She is not taking muscle relaxers or gabapentin or cymblata.  She has been taking klonipin bid for many years so doubt from medications - CT Head Wo Contrast; Future - fluticasone (FLONASE) 50 MCG/ACT nasal spray; Place 2 sprays into both nostrils daily.  Dispense: 16 g; Refill: 6 - Thyroid Panel With TSH - Vitamin D, 25-hydroxy  2. Congestion of nasal sinus - fluticasone (FLONASE) 50 MCG/ACT nasal spray; Place 2 sprays into both nostrils daily.  Dispense: 16 g; Refill: 6  3. Primary hypertension Adequate control.  Continue current regimen   4. Encounter for examination following treatment at hospital  5. Other peripheral vertigo, unspecified ear See #1 - CT Head Wo Contrast; Future  6. Hyperglycemia I have had a  lengthy discussion and provided education about insulin resistance and the intake of too much sugar/refined carbohydrates.  I have advised the patient to work at a goal of eliminating sugary drinks, candy, desserts, sweets, refined sugars, processed foods, and white carbohydrates.  The patient expresses understanding.   - Hemoglobin A1c    Patient have been counseled extensively about nutrition and exercise  Return in about 3 months (around 06/08/2020), or if symptoms worsen or fail to improve, for PCP;  chronic conditions.  The patient was given clear instructions to go to ER or return to medical center if symptoms don't improve, worsen or new problems develop. The patient verbalized understanding. The patient was told to call to get lab results if they haven't heard anything in the next week.     Freeman Caldron, PA-C [facility] [place], [place]   03/08/2020, 2:50 PM

## 2020-03-08 ENCOUNTER — Other Ambulatory Visit: Payer: Self-pay

## 2020-03-08 ENCOUNTER — Encounter: Payer: Self-pay | Admitting: Physician Assistant

## 2020-03-08 ENCOUNTER — Ambulatory Visit: Payer: Self-pay | Attending: Physician Assistant | Admitting: Physician Assistant

## 2020-03-08 VITALS — BP 137/82 | HR 80 | Temp 96.6°F | Ht 63.5 in | Wt 216.0 lb

## 2020-03-08 DIAGNOSIS — I10 Essential (primary) hypertension: Secondary | ICD-10-CM

## 2020-03-08 DIAGNOSIS — R0981 Nasal congestion: Secondary | ICD-10-CM

## 2020-03-08 DIAGNOSIS — R42 Dizziness and giddiness: Secondary | ICD-10-CM

## 2020-03-08 DIAGNOSIS — Z09 Encounter for follow-up examination after completed treatment for conditions other than malignant neoplasm: Secondary | ICD-10-CM

## 2020-03-08 DIAGNOSIS — R739 Hyperglycemia, unspecified: Secondary | ICD-10-CM

## 2020-03-08 DIAGNOSIS — H81399 Other peripheral vertigo, unspecified ear: Secondary | ICD-10-CM

## 2020-03-08 MED ORDER — FLUTICASONE PROPIONATE 50 MCG/ACT NA SUSP: 2.0000 | Freq: Every day | NASAL | 6 refills | Status: DC

## 2020-03-08 NOTE — Patient Instructions (Signed)

## 2020-03-09 ENCOUNTER — Other Ambulatory Visit: Payer: Self-pay | Admitting: Physician Assistant

## 2020-03-09 LAB — THYROID PANEL WITH TSH
Free Thyroxine Index: 1.5 (ref 1.2–4.9)
T3 Uptake Ratio: 20 % — ABNORMAL LOW (ref 24–39)
T4, Total: 7.4 ug/dL (ref 4.5–12.0)
TSH: 1.89 u[IU]/mL (ref 0.450–4.500)

## 2020-03-09 LAB — VITAMIN D 25 HYDROXY (VIT D DEFICIENCY, FRACTURES): Vit D, 25-Hydroxy: 23.9 ng/mL — ABNORMAL LOW (ref 30.0–100.0)

## 2020-03-09 LAB — HEMOGLOBIN A1C
Est. average glucose Bld gHb Est-mCnc: 114 mg/dL
Hgb A1c MFr Bld: 5.6 % (ref 4.8–5.6)

## 2020-03-09 MED ORDER — VITAMIN D (ERGOCALCIFEROL) 1.25 MG (50000 UNIT) PO CAPS: 50000.0000 [IU] | ORAL_CAPSULE | ORAL | 0 refills | Status: DC

## 2020-03-09 MED FILL — VIT D2 1.25 MG (50,000 UNIT: 1.25 MG | 28 days supply | Qty: 4 | Fill #0

## 2020-03-09 MED FILL — FLUTICASONE PROP 50 MCG SPR: 50 | 30 days supply | Qty: 16 | Fill #0

## 2020-03-12 ENCOUNTER — Other Ambulatory Visit (INDEPENDENT_AMBULATORY_CARE_PROVIDER_SITE_OTHER): Payer: Self-pay

## 2020-03-12 DIAGNOSIS — R109 Unspecified abdominal pain: Secondary | ICD-10-CM

## 2020-03-12 DIAGNOSIS — K5900 Constipation, unspecified: Secondary | ICD-10-CM

## 2020-03-12 DIAGNOSIS — R1310 Dysphagia, unspecified: Secondary | ICD-10-CM

## 2020-03-12 DIAGNOSIS — K219 Gastro-esophageal reflux disease without esophagitis: Secondary | ICD-10-CM

## 2020-03-12 LAB — LIPASE: Lipase: 118 U/L — ABNORMAL HIGH (ref 11.0–59.0)

## 2020-03-13 ENCOUNTER — Other Ambulatory Visit: Payer: Self-pay

## 2020-03-13 DIAGNOSIS — R109 Unspecified abdominal pain: Secondary | ICD-10-CM

## 2020-03-13 DIAGNOSIS — R748 Abnormal levels of other serum enzymes: Secondary | ICD-10-CM

## 2020-03-16 MED FILL — BENAZEPRIL HCL 20 MG TABLET: 20 | 30 days supply | Qty: 30 | Fill #6

## 2020-03-20 ENCOUNTER — Other Ambulatory Visit: Payer: Self-pay

## 2020-03-20 ENCOUNTER — Encounter (HOSPITAL_COMMUNITY): Payer: Self-pay

## 2020-03-20 ENCOUNTER — Ambulatory Visit (HOSPITAL_COMMUNITY)
Admission: RE | Admit: 2020-03-20 | Discharge: 2020-03-20 | Disposition: A | Discharge status: Home / Self Care | Payer: Self-pay | Source: Ambulatory Visit | Attending: Physician Assistant | Admitting: Physician Assistant

## 2020-03-20 DIAGNOSIS — H81399 Other peripheral vertigo, unspecified ear: Secondary | ICD-10-CM | POA: Insufficient documentation

## 2020-03-20 DIAGNOSIS — R109 Unspecified abdominal pain: Secondary | ICD-10-CM | POA: Insufficient documentation

## 2020-03-20 DIAGNOSIS — R748 Abnormal levels of other serum enzymes: Secondary | ICD-10-CM | POA: Insufficient documentation

## 2020-03-20 DIAGNOSIS — R42 Dizziness and giddiness: Secondary | ICD-10-CM | POA: Insufficient documentation

## 2020-03-20 LAB — POCT I-STAT CREATININE: Creatinine, Ser: 0.9 mg/dL (ref 0.44–1.00)

## 2020-03-20 IMAGING — CT CT ABDOMEN W/ CM
2 of 9 series · 11 of 46 positions shown, 17 images · IV contrast (OMNIPAQUE)
Comparison: None.

CLINICAL DATA: Abdominal pain. Elevated lipase level. Personal
history of uterine carcinoma.

EXAM:
CT ABDOMEN WITH CONTRAST
TECHNIQUE: Multidetector CT imaging of the abdomen was performed using the
standard protocol following bolus administration of intravenous
contrast.
CONTRAST:  100mL OMNIPAQUE IOHEXOL 300 MG/ML  SOLN

[Series 4: coronal arterial · coronal · arterial · 0.67mm/px · 3 of 101 slices shown, 4 images]
[im 26/101  soft-tissue]
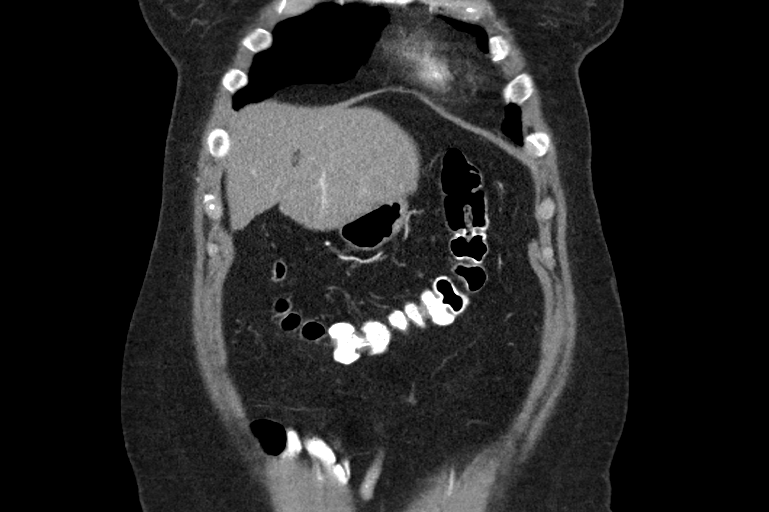
[im 51/101  soft-tissue]
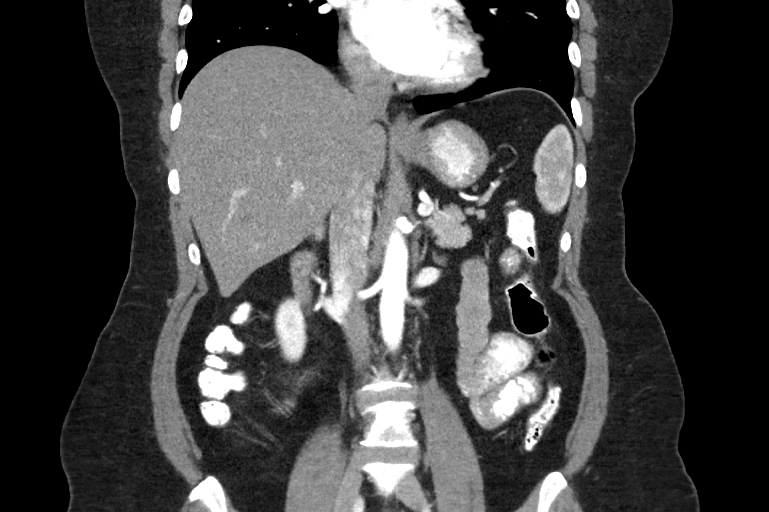
[im 51/101  bone]
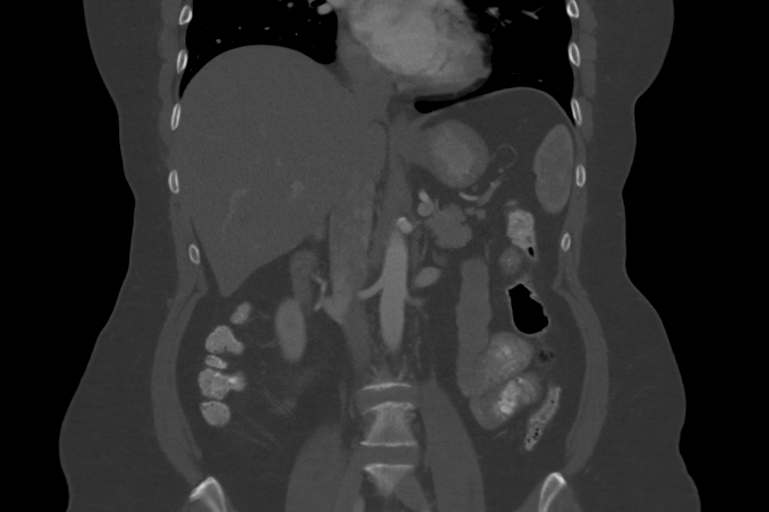
[im 76/101  soft-tissue]
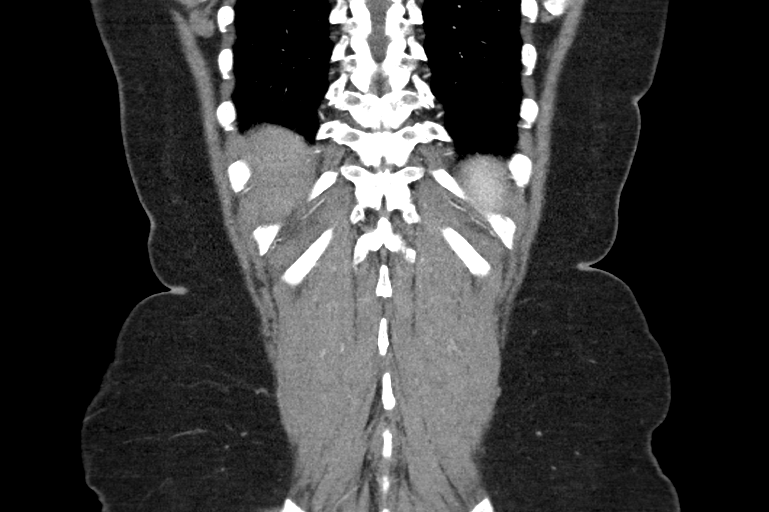

[Series 8: axial venous · axial · portal-venous · 0.66mm/px · z∈[+988,+1254]mm · 8 of 115 slices shown, 13 images]
[im 13/115  soft-tissue]
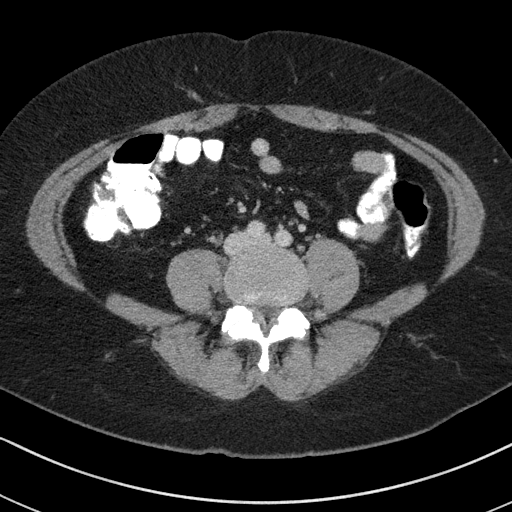
[im 13/115  bone]
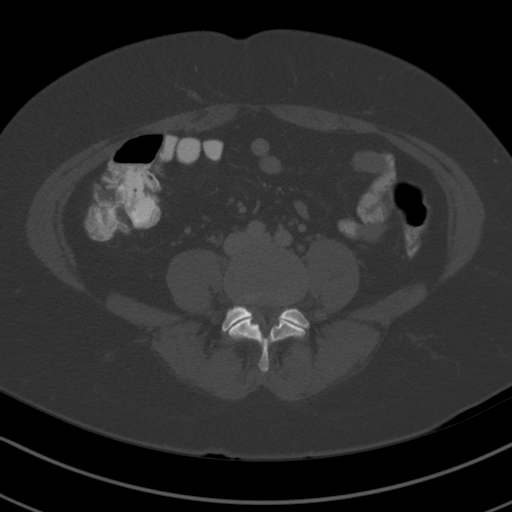
[im 26/115  soft-tissue]
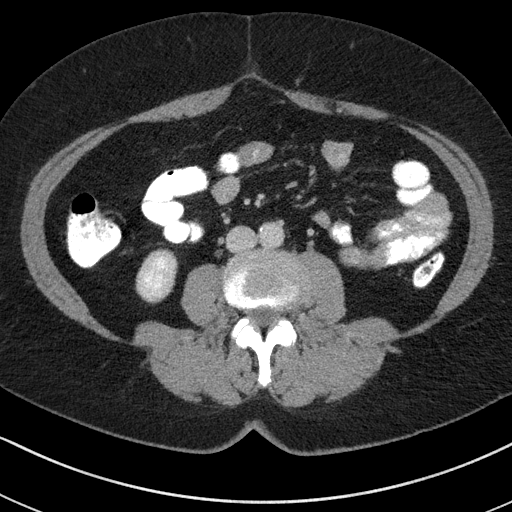
[im 39/115  soft-tissue]
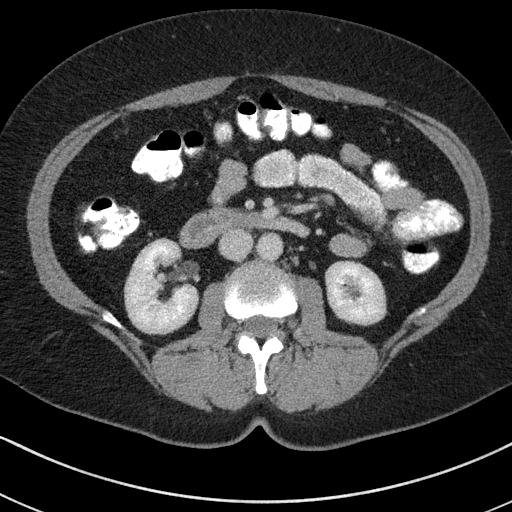
[im 51/115  soft-tissue]
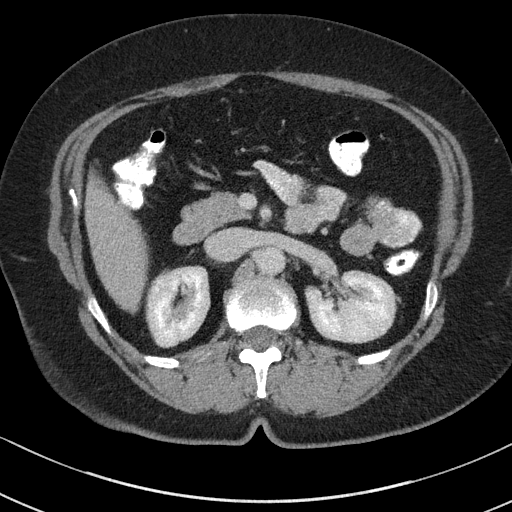
[im 64/115  soft-tissue]
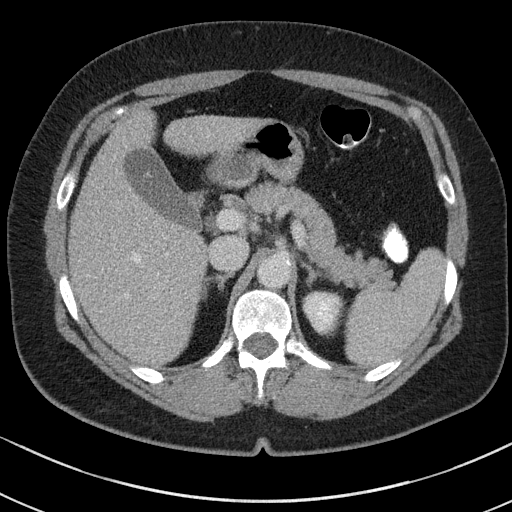
[im 64/115  lung]
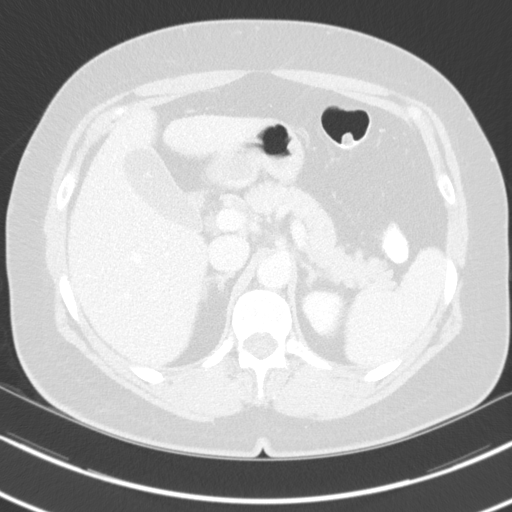
[im 77/115  soft-tissue]
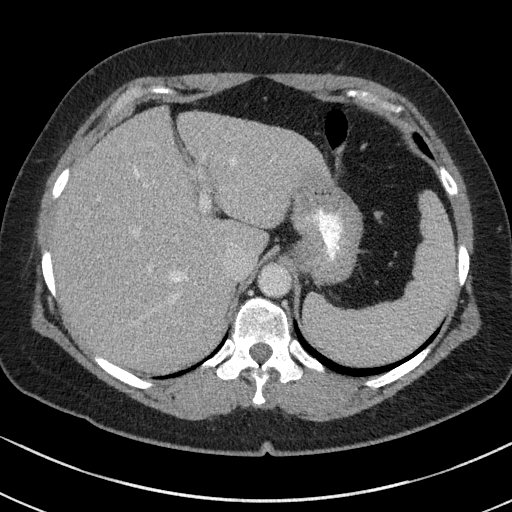
[im 77/115  lung]
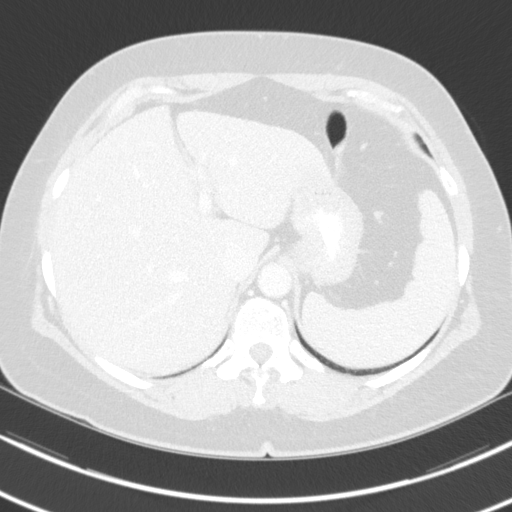
[im 89/115  soft-tissue]
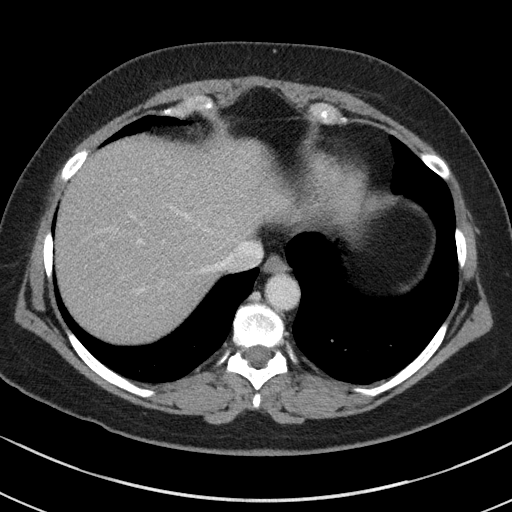
[im 89/115  lung]
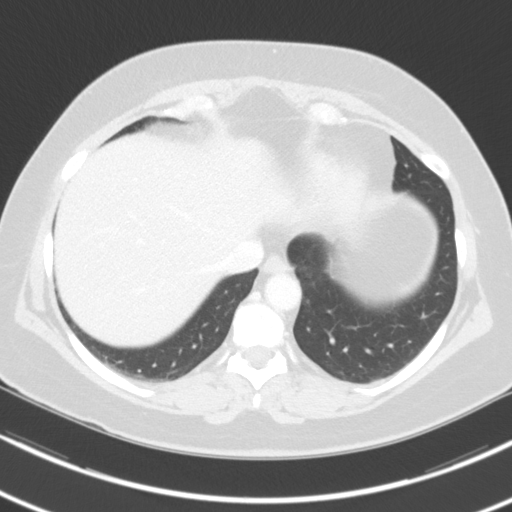
[im 102/115  soft-tissue]
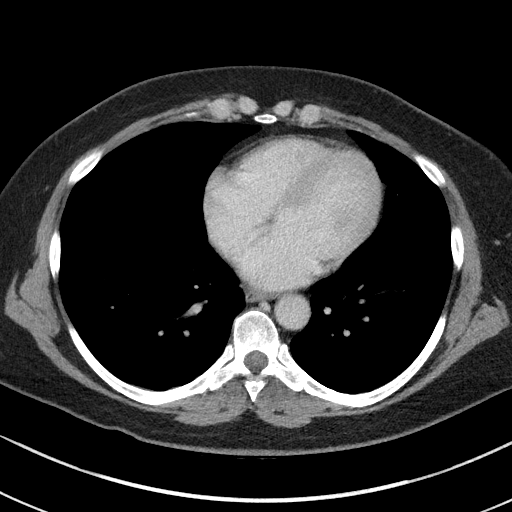
[im 102/115  lung]
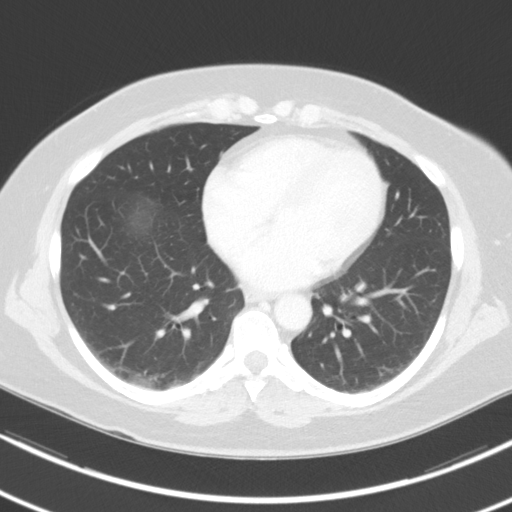

[11 of 46 positions shown; findings below may reference images not displayed]

FINDINGS: Lower chest: No acute findings.

Hepatobiliary: No hepatic masses identified. Mild diffuse hepatic
steatosis is noted. Cholelithiasis. No radiographic evidence of
cholecystitis.

Pancreas: Unremarkable. No mass or inflammatory changes. No evidence
of pancreatic ductal dilatation, calcifications, or peripancreatic
fluid collections.

Spleen:  Within normal limits in size and appearance.

Adrenals/Urinary Tract: No masses identified. No evidence of
hydronephrosis.

Stomach/Bowel: Visualized portion unremarkable.

Vascular/Lymphatic: No pathologically enlarged lymph nodes
identified. No abdominal aortic aneurysm. Aortic atherosclerotic
calcification noted.

Other:  None.

Musculoskeletal:  No suspicious bone lesions identified.
IMPRESSION: No radiographic evidence of pancreatitis or other acute findings.

Cholelithiasis. No radiographic evidence of cholecystitis.

Mild hepatic steatosis.

Aortic Atherosclerosis ([IV]-[IV]).

## 2020-03-20 IMAGING — CT CT HEAD W/O CM
3 series · 15 of 47 positions shown, 18 images · non-contrast
Comparison: No pertinent prior exams available for comparison.

CLINICAL DATA: Vertigo. Other peripheral vertigo, unspecified ear.
Additional history provided by scanning technologist: Patient
reports dizziness for 2 months.

EXAM:
CT HEAD WITHOUT CONTRAST
TECHNIQUE: Contiguous axial images were obtained from the base of the skull
through the vertex without intravenous contrast.

[Series 4: head wo · axial · 0.47mm/px · z∈[+1526,+1651]mm · 9 of 30 slices shown, 12 images]
[im 3/30  brain]
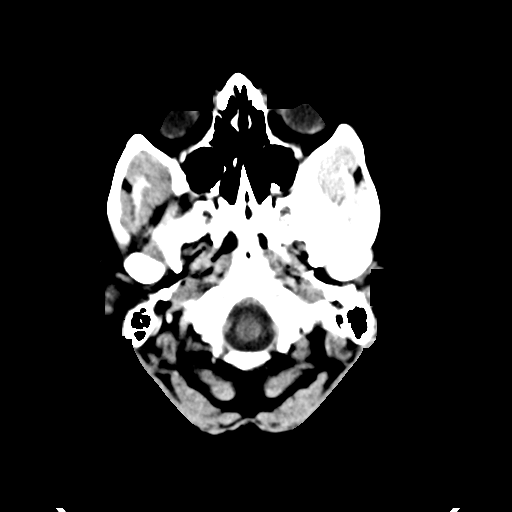
[im 3/30  bone]
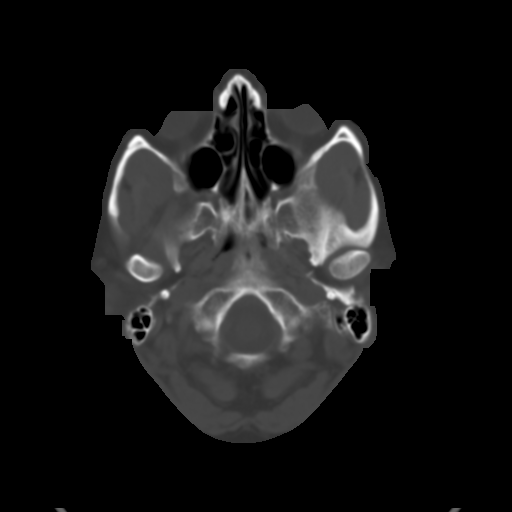
[im 6/30  brain]
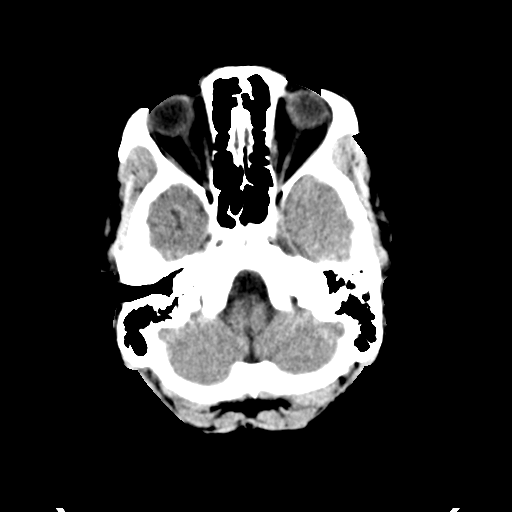
[im 9/30  brain]
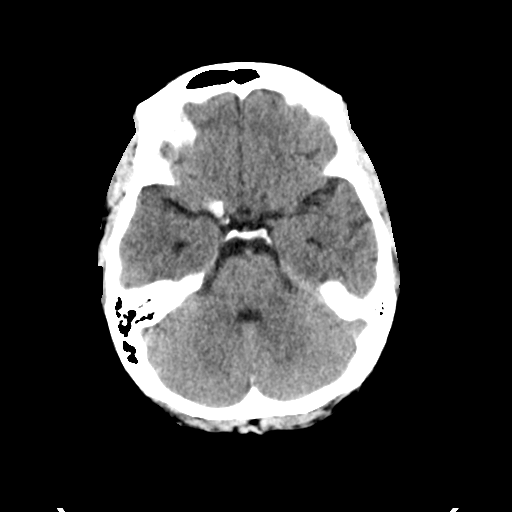
[im 12/30  brain]
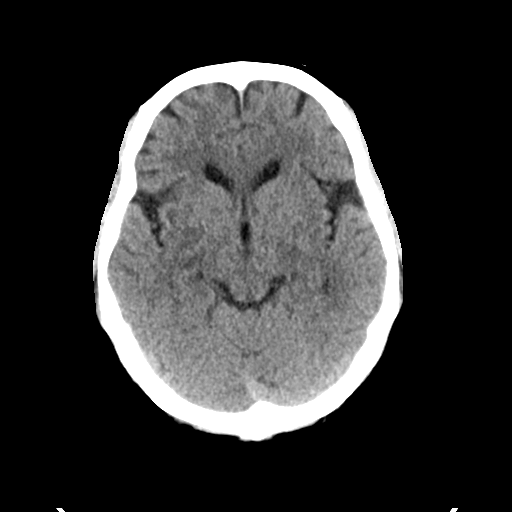
[im 16/30  brain]
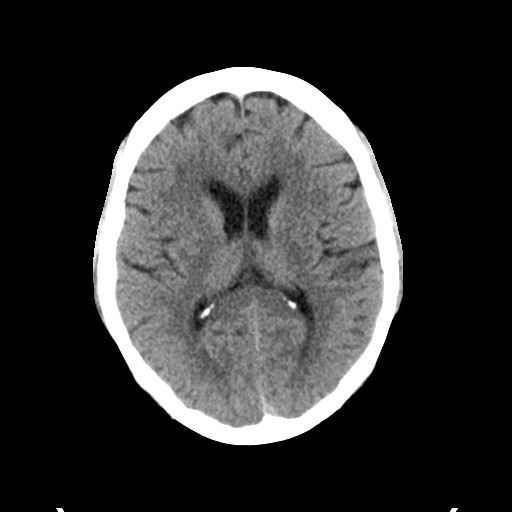
[im 16/30  bone]
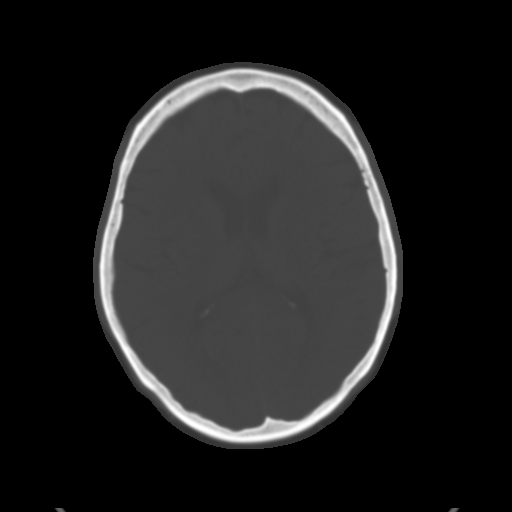
[im 19/30  brain]
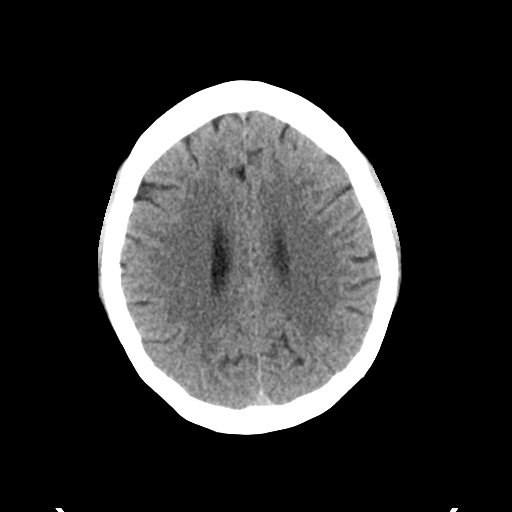
[im 22/30  brain]
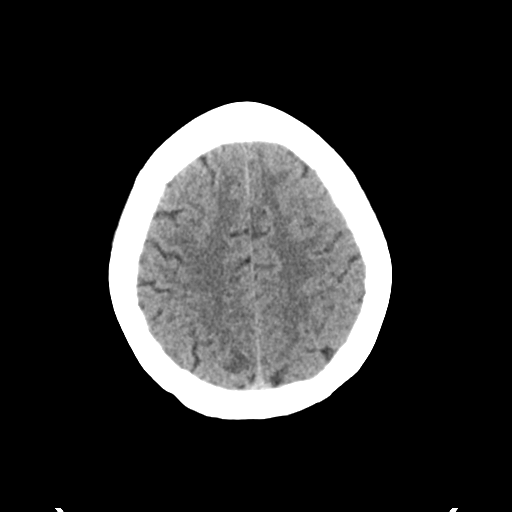
[im 25/30  brain]
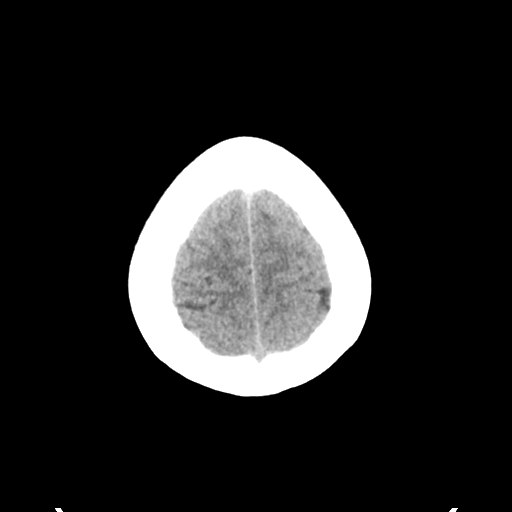
[im 28/30  brain]
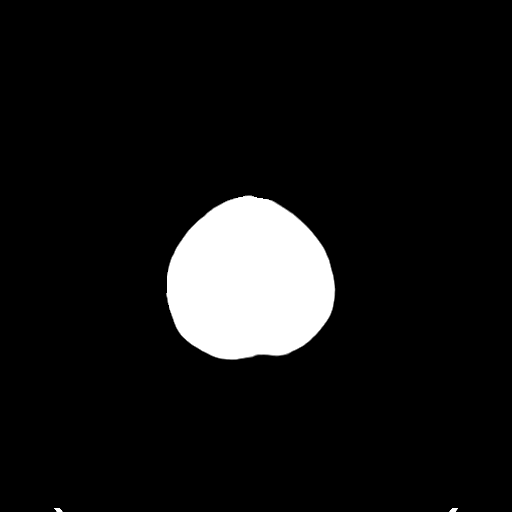
[im 28/30  bone]
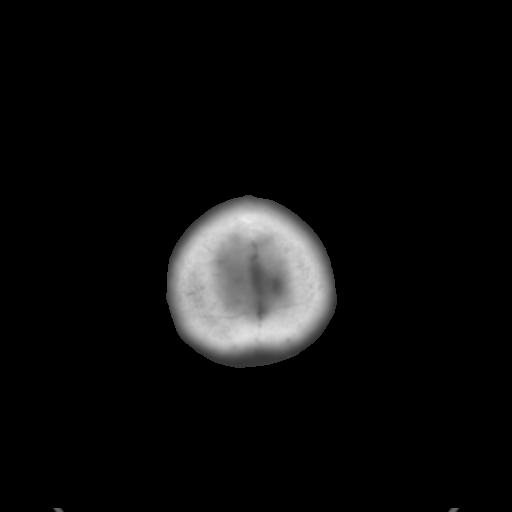

[Series 6: coronal soft tissue · coronal · 0.30mm/px · 3 of 62 slices shown]
[im 21/62  brain]
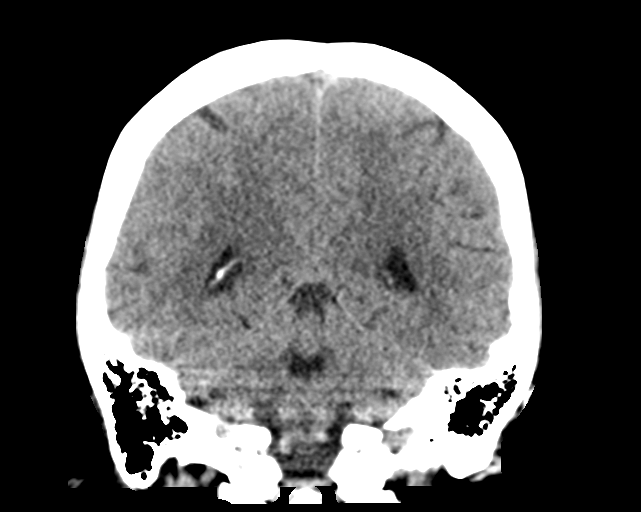
[im 28/62  brain]
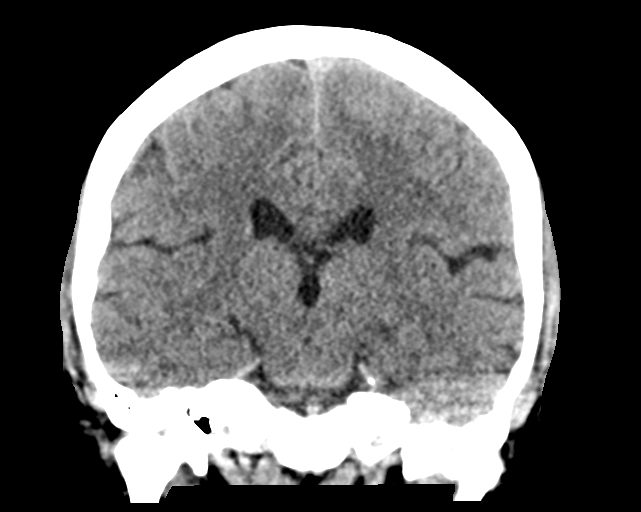
[im 34/62  brain]
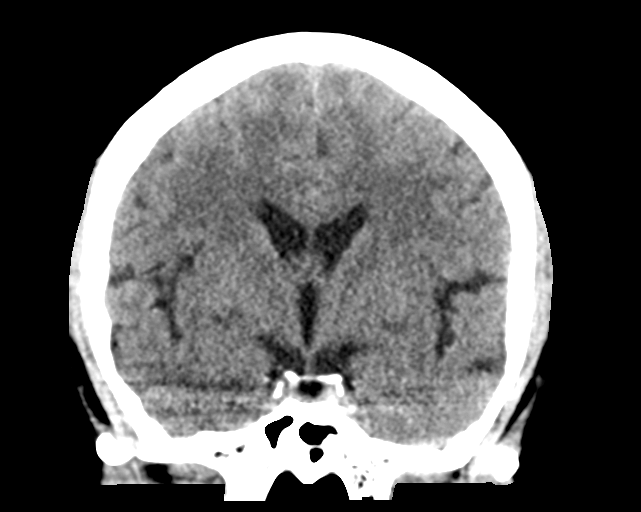

[Series 7: sagittal soft tissue · sagittal · 0.33mm/px · 3 of 56 slices shown]
[im 19/56  brain]
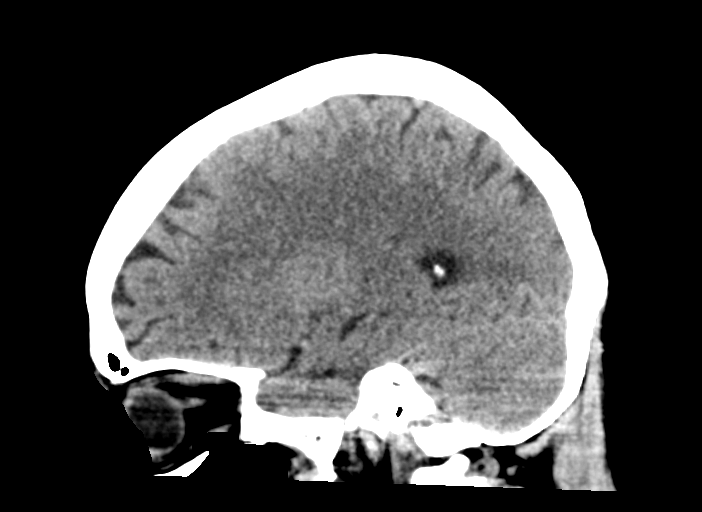
[im 28/56  brain]
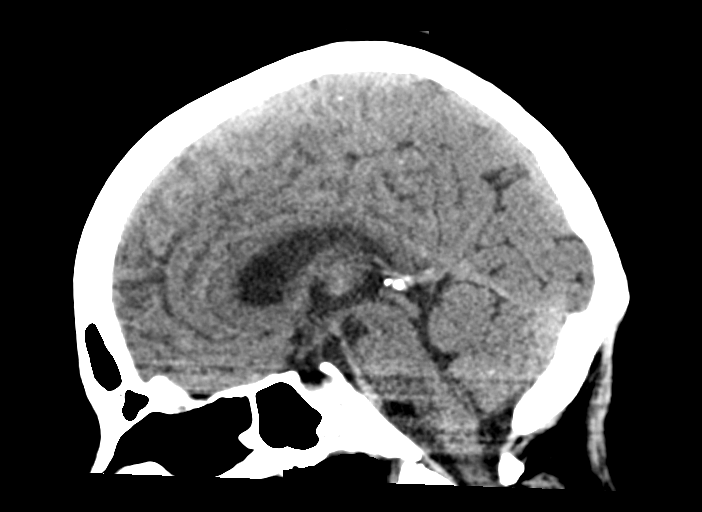
[im 37/56  brain]
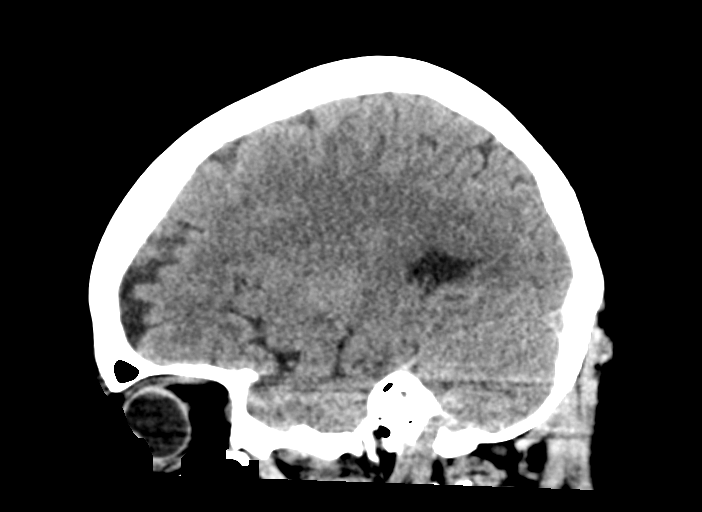

[15 of 47 positions shown; findings below may reference images not displayed]

FINDINGS: Brain:

Cerebral volume is normal for age.

There is no acute intracranial hemorrhage.

No demarcated cortical infarct.

No extra-axial fluid collection.

No evidence of intracranial mass.

No midline shift.

Partially empty sella turcica.

Vascular: No hyperdense vessel.  Atherosclerotic calcifications

Skull: Normal. Negative for fracture or focal lesion.

Sinuses/Orbits: Visualized orbits show no acute finding. No
significant paranasal sinus disease at the imaged levels.
IMPRESSION: No evidence of acute intracranial abnormality.

Partially empty sella turcica. This finding is very commonly
incidental, but can be associated with idiopathic intracranial
hypertension.

Otherwise unremarkable noncontrast CT appearance of the brain for
age.

## 2020-03-20 MED ORDER — IOHEXOL 300 MG/ML  SOLN
100.0000 mL | Freq: Once | INTRAMUSCULAR | Status: AC | PRN
  Administered 2020-03-20: 100 mL via INTRAVENOUS

## 2020-03-21 ENCOUNTER — Ambulatory Visit (HOSPITAL_COMMUNITY): Payer: Self-pay

## 2020-03-21 ENCOUNTER — Other Ambulatory Visit: Payer: Self-pay | Admitting: Physician Assistant

## 2020-03-21 DIAGNOSIS — R930 Abnormal findings on diagnostic imaging of skull and head, not elsewhere classified: Secondary | ICD-10-CM

## 2020-03-21 DIAGNOSIS — R42 Dizziness and giddiness: Secondary | ICD-10-CM

## 2020-03-26 ENCOUNTER — Other Ambulatory Visit: Payer: Self-pay

## 2020-03-26 ENCOUNTER — Encounter: Payer: Self-pay | Admitting: Neurology

## 2020-03-26 DIAGNOSIS — R748 Abnormal levels of other serum enzymes: Secondary | ICD-10-CM

## 2020-03-26 DIAGNOSIS — R109 Unspecified abdominal pain: Secondary | ICD-10-CM

## 2020-04-03 NOTE — Progress Notes (Signed)
NEUROLOGY CONSULTATION NOTE  Savon Cobbs MRN: [id number] DOB: 1960-05-28  Referring provider: Mikey Kirschner Primary care provider: Charlott Rakes, MD  Reason for consult:  vertigo   Subjective:  Christina Burke is a 59 year old right-handed female with HTN, murmur and anxiety who presents for vertigo.  History supplemented by Urgent Care and referring provider's notes.  For 3 months, she has had positional vertigo.  She describes a spinning sensation associated with nausea.  No associated headache, tinnitus, pulsatile tinnitus.  No preceding head trauma or illness.  She reports similar spells in the past but they usually only lasted 1 to 2 days.  She has an astigmatism so she endorses some double vision at times while driving, but that is not new.  No blurred vision or visual obscurations.  Last eye exam was about a year ago.  It occurs when laying supine or rolling over on either side.  It may also occur when looking up or down.  It only lasts a few seconds.  It occurs daily.   She went to Urgent Care on 02/27/2020 and was diagnosed with benign paroxysmal positional vertigo.  Orthostatic vitals were negative.  She followed up with PCP who provided her with instructions for the Epley maneuver.  She was prescribed meclizine but hasn't taken it (she is concerned about possible side effects with her other medications).    She had CT of the head performed on 03/20/2020 which was personally reviewed and showed partially empty sella but no acute intracranial abnormality.  She has known cervical spondylosis with spinal stenosis.  MRI of cervical spine from March personally reviewed showed mild cord compression with possible mild chronic encephalomalacia at C4-5 and C5-6 levels.  She had neck pain but no numbness and weakness of the extremities (except for some numbness in the right foot due to lumbar radiculopathy) or ataxia.  She deferred surgery.     PAST MEDICAL HISTORY: Past Medical  History:  Diagnosis Date  . Anxiety   . Cancer ([place])    pt. reports had uterine cancer x 10 years ago  . Chest pain 12/08/2013  . GERD (gastroesophageal reflux disease) 08/10/2014  . Hyperlipidemia   . Hypertension   . Murmur 12/08/2013    PAST SURGICAL HISTORY: Past Surgical History:  Procedure Laterality Date  . ABDOMINAL HYSTERECTOMY    . ABDOMINAL SURGERY    . COLONOSCOPY    . UPPER GASTROINTESTINAL ENDOSCOPY  4/21/216    MEDICATIONS: Current Outpatient Medications on File Prior to Visit  Medication Sig Dispense Refill  . benazepril (LOTENSIN) 20 MG tablet Take 1 tablet (20 mg total) by mouth daily. 30 tablet 6  . clonazePAM (KLONOPIN) 1 MG tablet Take 1 mg by mouth 2 (two) times daily.    . cyclobenzaprine (FLEXERIL) 5 MG tablet Take 1 tablet (5 mg total) by mouth 3 (three) times daily as needed for muscle spasms. (Patient not taking: Reported on 02/16/2020) 30 tablet 3  . diclofenac Sodium (VOLTAREN) 1 % GEL Apply 2 g topically 4 (four) times daily. 100 g 3  . DULoxetine (CYMBALTA) 60 MG capsule Take 1 capsule (60 mg total) by mouth 2 (two) times daily. For back pain (Patient not taking: Reported on 02/16/2020) 60 capsule 3  . fluticasone (FLONASE) 50 MCG/ACT nasal spray Place 2 sprays into both nostrils daily. 16 g 6  . gabapentin (NEURONTIN) 300 MG capsule Take 1 capsule (300 mg total) by mouth 3 (three) times daily. (Patient not taking: Reported on 02/16/2020) 90  capsule 0  . lidocaine (LIDODERM) 5 % Place 1 patch onto the skin daily. Remove & Discard patch within 12 hours or as directed by MD (Patient not taking: Reported on 02/16/2020) 30 patch 3  . meclizine (ANTIVERT) 25 MG tablet Take 1 tablet (25 mg total) by mouth 3 (three) times daily as needed for dizziness. 30 tablet 0  . methocarbamol (ROBAXIN) 500 MG tablet Take 1 tablet (500 mg total) by mouth every 8 (eight) hours as needed for muscle spasms. (Patient not taking: Reported on 02/16/2020) 60 tablet 6  . NEXIUM 40  MG packet TAKE 40 MG BY MOUTH 2 (TWO) TIMES DAILY. 30 each 3  . ondansetron (ZOFRAN ODT) 4 MG disintegrating tablet Take 1 tablet (4 mg total) by mouth every 8 (eight) hours as needed for nausea or vomiting. (Patient not taking: Reported on 02/16/2020) 20 tablet 0  . tizanidine (ZANAFLEX) 2 MG capsule Take 1 capsule (2 mg total) by mouth at bedtime as needed for muscle spasms. (Patient not taking: Reported on 10/18/2019) 30 capsule 0  . Vitamin D, Ergocalciferol, (DRISDOL) 1.25 MG (50000 UNIT) CAPS capsule Take 1 capsule (50,000 Units total) by mouth every 7 (seven) days. 16 capsule 0   Current Facility-Administered Medications on File Prior to Visit  Medication Dose Route Frequency Provider Last Rate Last Admin  . 0.9 %  sodium chloride infusion  500 mL Intravenous Once Mansouraty, Telford Nab., MD        ALLERGIES: Allergies  Allergen Reactions  . Amoxicillin Rash    Has patient had a PCN reaction causing immediate rash, facial/tongue/throat swelling, SOB or lightheadedness with hypotension: yes Has patient had a PCN reaction causing severe rash involving mucus membranes or skin necrosis: no Has patient had a PCN reaction that required hospitalization: no Has patient had a PCN reaction occurring within the last 10 years: no If all of the above answers are "NO", then may proceed with Cephalosporin use.   Marland Kitchen Penicillins Rash    FAMILY HISTORY: Family History  Problem Relation Age of Onset  . Gallbladder disease Mother   . Heart disease Father   . Lung cancer Father   . Brain cancer Father   . Colon polyps Sister   . Diabetes Sister        x3  . Diabetes Brother   . Breast cancer Paternal Aunt   . Colon cancer Neg Hx   . Kidney disease Neg Hx   . Esophageal cancer Neg Hx   . Inflammatory bowel disease Neg Hx   . Liver disease Neg Hx   . Pancreatic cancer Neg Hx   . Rectal cancer Neg Hx   . Stomach cancer Neg Hx     SOCIAL HISTORY: Social History   Socioeconomic History  .  Marital status: Single    Spouse name: Not on file  . Number of children: 2  . Years of education: Not on file  . Highest education level: Some college, no degree  Occupational History  . Occupation: Unemployed  Tobacco Use  . Smoking status: Never Smoker  . Smokeless tobacco: Never Used  Vaping Use  . Vaping Use: Never used  Substance and Sexual Activity  . Alcohol use: Yes    Alcohol/week: 1.0 - 2.0 standard drink    Types: 1 - 2 Cans of beer per week    Comment: a night  . Drug use: No  . Sexual activity: Yes    Birth control/protection: None  Other Topics Concern  .  Not on file  Social History Narrative  . Not on file   Social Determinants of Health   Financial Resource Strain:   . Difficulty of Paying Living Expenses: Not on file  Food Insecurity:   . Worried About Charity fundraiser in the Last Year: Not on file  . Ran Out of Food in the Last Year: Not on file  Transportation Needs: No Transportation Needs  . Lack of Transportation (Medical): No  . Lack of Transportation (Non-Medical): No  Physical Activity:   . Days of Exercise per Week: Not on file  . Minutes of Exercise per Session: Not on file  Stress:   . Feeling of Stress : Not on file  Social Connections:   . Frequency of Communication with Friends and Family: Not on file  . Frequency of Social Gatherings with Friends and Family: Not on file  . Attends Religious Services: Not on file  . Active Member of Clubs or Organizations: Not on file  . Attends Archivist Meetings: Not on file  . Marital Status: Not on file  Intimate Partner Violence:   . Fear of Current or Ex-Partner: Not on file  . Emotionally Abused: Not on file  . Physically Abused: Not on file  . Sexually Abused: Not on file    Objective:  Blood pressure 135/80, pulse 91, height 5\' 3"  (1.6 m), weight 222 lb 3.2 oz (100.8 kg), SpO2 95 %. General: No acute distress.  Patient appears well-groomed.   Head:   Normocephalic/atraumatic Eyes:  fundi showed optic disc with sharp margins and no optic nerve edema. Neck: supple, no paraspinal tenderness, full range of motion Back: No paraspinal tenderness Heart: regular rate and rhythm Lungs: Clear to auscultation bilaterally. Vascular: No carotid bruits. Neurological Exam: Mental status: alert and oriented to person, place, and time, recent and remote memory intact, fund of knowledge intact, attention and concentration intact, speech fluent and not dysarthric, language intact. Cranial nerves: CN I: not tested CN II: pupils equal, round and reactive to light, visual fields intact CN III, IV, VI:  full range of motion, no nystagmus, no ptosis CN V: facial sensation intact. CN VII: upper and lower face symmetric CN VIII: hearing intact CN IX, X: gag intact, uvula midline CN XI: sternocleidomastoid and trapezius muscles intact CN XII: tongue midline Bulk & Tone: normal, no fasciculations. Motor:  muscle strength 5/5 throughout Sensation:  Pinprick, temperature and vibratory sensation intact. Deep Tendon Reflexes:  2+ throughout,  toes downgoing.   Finger to nose testing:  Without dysmetria.   Heel to shin:  Without dysmetria.   Gait:  Normal station and stride.  Romberg negative.  Assessment/Plan:   1.  Vertigo - likely benign paroxysmal positional vertigo.  However, may consider cervicogenic vertigo as well.  Given onset of symptoms with neck movement and her known cervical spondylosis, I would want to evaluate for vertebral artery syndrome as well. 2.  Empty sella turcica - incidental finding.  Often of no clinical significance.  I do not appreciate papilledema on fundoscopic exam.  May want to consider seeing the eye doctor for more thorough testing.  But I do not suspect increased intracranial pressure.  1.  MRA of neck with contrast 2.  Due to her cervical spinal stenosis, I would want to avoid any extensive neck movements with Epley maneuver.   However, may want to refer to PT for vestibular rehab for more conservative therapy (such as with eyes) 3.  Follow up after  testing.  Thank you for allowing me to take part in the care of this patient.  Metta Clines, DO  CC:  Charlott Rakes, MD  Freeman Caldron, PA-C

## 2020-04-04 ENCOUNTER — Other Ambulatory Visit: Payer: Self-pay

## 2020-04-04 ENCOUNTER — Ambulatory Visit (INDEPENDENT_AMBULATORY_CARE_PROVIDER_SITE_OTHER): Payer: Self-pay | Admitting: Neurology

## 2020-04-04 ENCOUNTER — Encounter: Payer: Self-pay | Admitting: Neurology

## 2020-04-04 VITALS — BP 135/80 | HR 91 | Ht 63.0 in | Wt 222.2 lb

## 2020-04-04 DIAGNOSIS — M4802 Spinal stenosis, cervical region: Secondary | ICD-10-CM

## 2020-04-04 DIAGNOSIS — R42 Dizziness and giddiness: Secondary | ICD-10-CM

## 2020-04-04 DIAGNOSIS — E236 Other disorders of pituitary gland: Secondary | ICD-10-CM

## 2020-04-04 DIAGNOSIS — G450 Vertebro-basilar artery syndrome: Secondary | ICD-10-CM

## 2020-04-04 NOTE — Patient Instructions (Signed)
I think the dizziness is likely due to inner ear or possibly related to the arthritis in your neck. I would like to check MRA of neck to evaluate the vertebral arteries and see if any arthritis may be pressing on the artery to cause dizziness. The finding on the head CT is likely incidental and does not mean you have increased pressure in the head.  Usually that finding may be normal for people. Further recommendations pending results Follow up after testing.

## 2020-04-15 ENCOUNTER — Other Ambulatory Visit: Payer: Self-pay

## 2020-04-15 ENCOUNTER — Emergency Department
Admission: EM | Admit: 2020-04-15 | Discharge: 2020-04-15 | Disposition: A | Discharge status: Home / Self Care | Payer: No Typology Code available for payment source | Source: Home / Self Care

## 2020-04-15 DIAGNOSIS — J069 Acute upper respiratory infection, unspecified: Secondary | ICD-10-CM

## 2020-04-15 NOTE — Discharge Instructions (Addendum)
Go home to rest Drink plenty of fluids Take Tylenol for pain or fever You may take over-the-counter cough and cold medicines as needed, Mucinex DM is safe You must quarantine at home until your test result is available You can check for your test result in MyChart

## 2020-04-15 NOTE — ED Triage Notes (Signed)
Pt presents to [facility] with c/o sore throat, cough, chest congestion, and diarrhea. Symptoms onset 4 days ago. OTC coricidin (for BP). Pt received her booster 1 week ago. No other concerns at this time.

## 2020-04-20 LAB — COVID-19, FLU A+B NAA
Influenza A, NAA: NOT DETECTED
Influenza B, NAA: NOT DETECTED
SARS-CoV-2, NAA: NOT DETECTED

## 2020-04-25 ENCOUNTER — Ambulatory Visit
Admission: RE | Admit: 2020-04-25 | Discharge: 2020-04-25 | Disposition: A | Discharge status: Home / Self Care | Payer: No Typology Code available for payment source | Source: Ambulatory Visit | Attending: Neurology | Admitting: Neurology

## 2020-04-25 DIAGNOSIS — G450 Vertebro-basilar artery syndrome: Secondary | ICD-10-CM

## 2020-04-25 IMAGING — MR MR MRA NECK WO/W CM
4 series · 35 of 48 positions shown · IV contrast (multihance)
Comparison: None.

CLINICAL DATA: Vertebral arteries syndrome.

EXAM:
MRA NECK WITHOUT AND WITH CONTRAST
TECHNIQUE: Multiplanar and multiecho pulse sequences of the neck were obtained
without and with intravenous contrast. Angiographic images of the
neck were obtained using MRA technique without and with intravenous
contrast.
CONTRAST:  20mL MULTIHANCE GADOBENATE DIMEGLUMINE 529 MG/ML IV SOLN

[Series 4: fl_tof_2d · axial · 3.0mm · 0.39mm/px · z∈[-65,+34]mm · 9 of 50 slices shown]
[im 1/50]
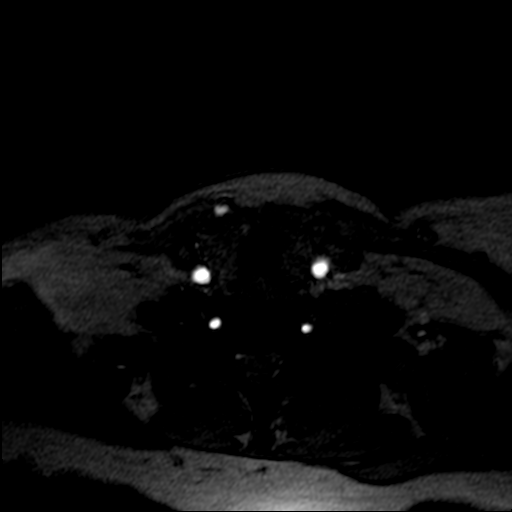
[im 7/50]
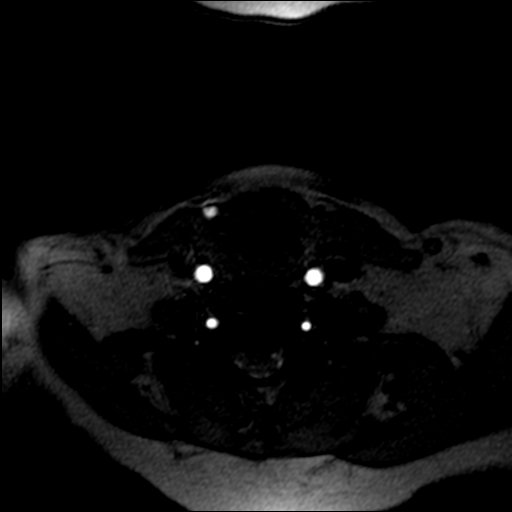
[im 13/50]
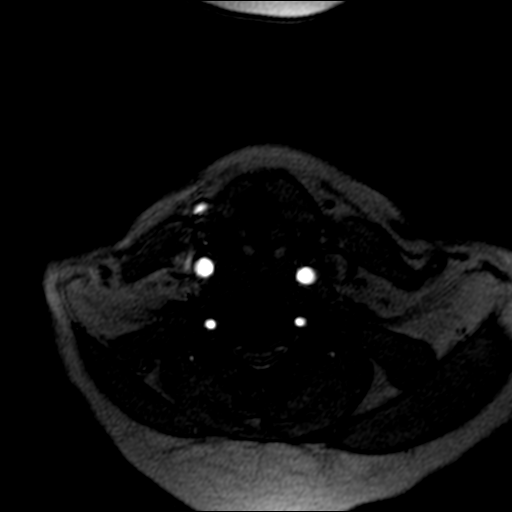
[im 19/50]
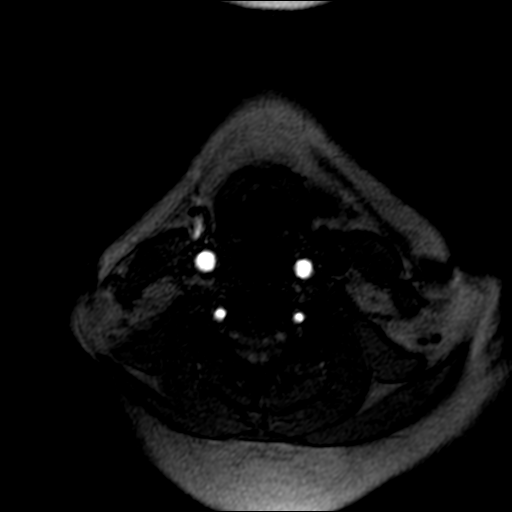
[im 25/50]
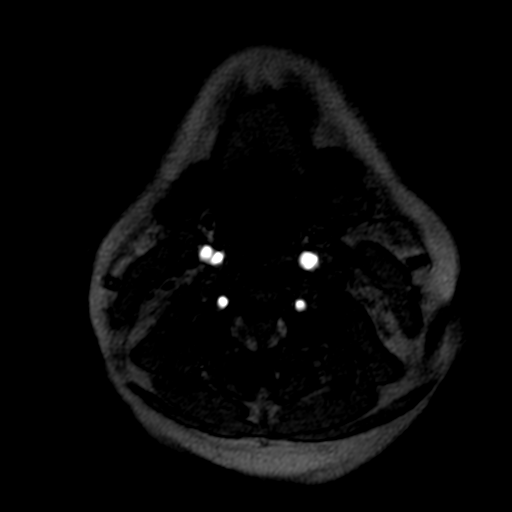
[im 31/50]
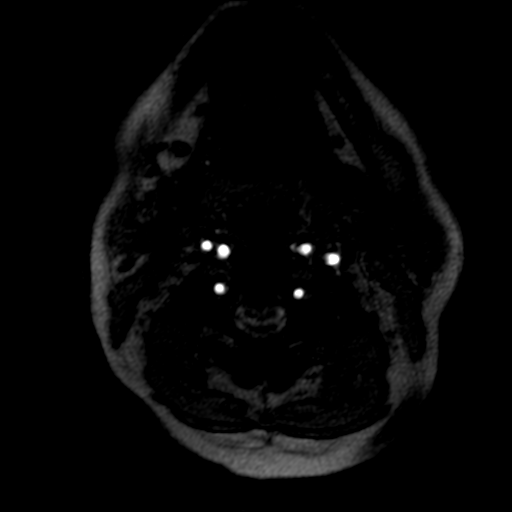
[im 37/50]
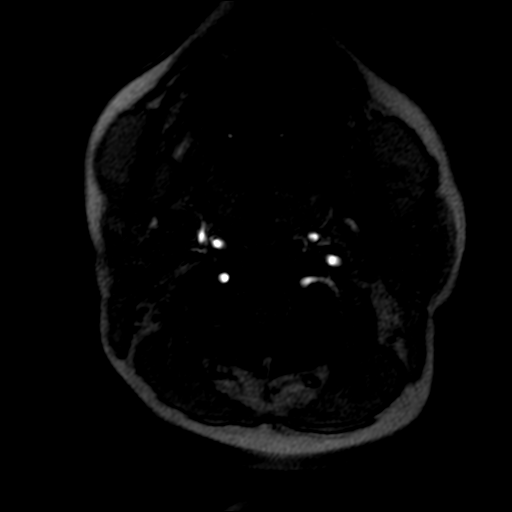
[im 43/50]
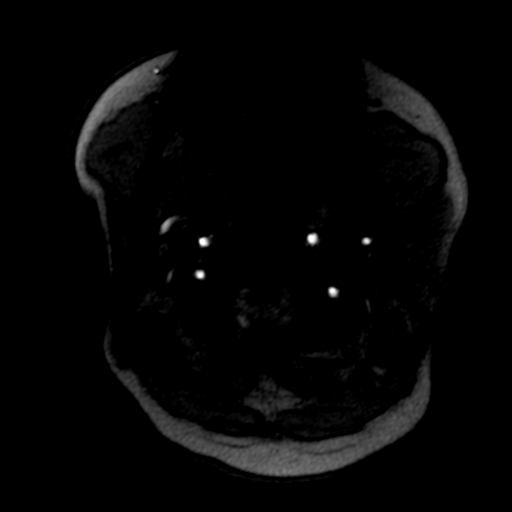
[im 50/50]
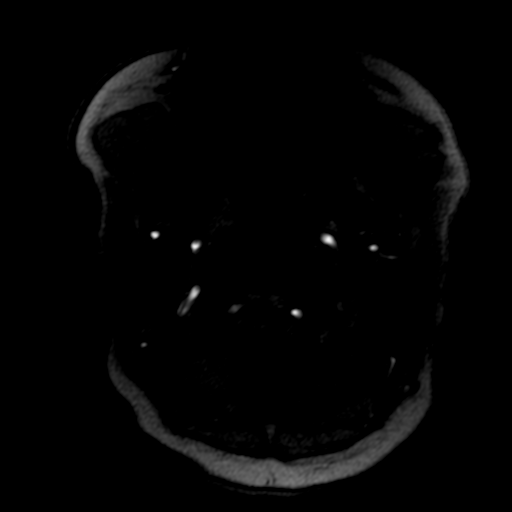

[Series 7: fl_tof_2d_mip_tra · axial · 101.5mm · 0.39mm/px · 1 of 1 slices shown]
[im 1/1]
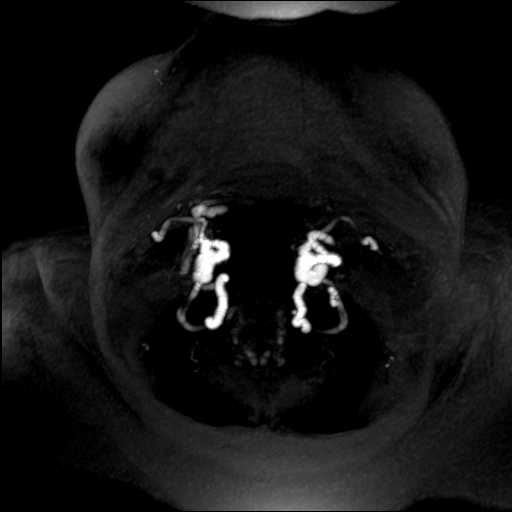

[Series 10: (id)_tt=1.0s · coronal · 0.8mm · 0.78mm/px · 16 of 104 slices shown]
[im 1/104]
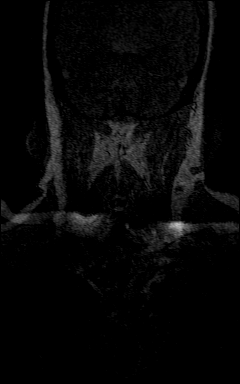
[im 6/104]
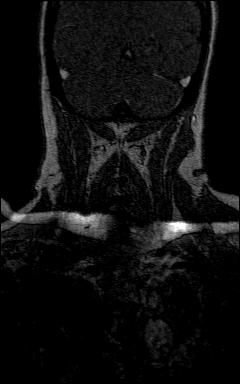
[im 12/104]
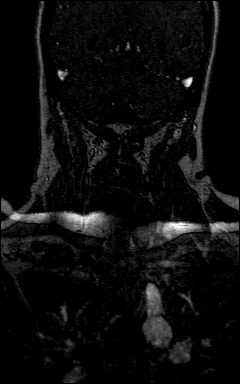
[im 18/104]
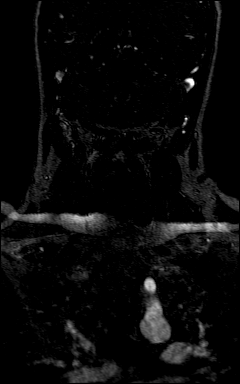
[im 23/104]
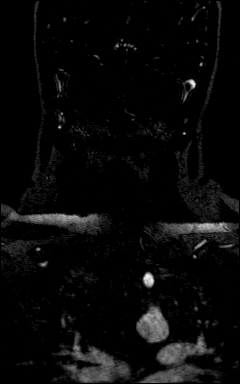
[im 29/104]
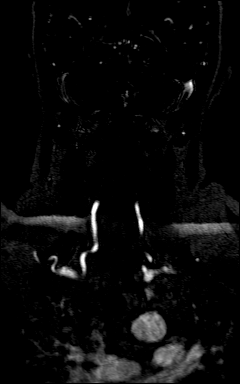
[im 35/104]
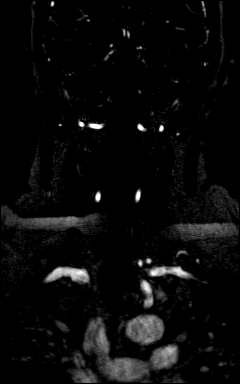
[im 41/104]
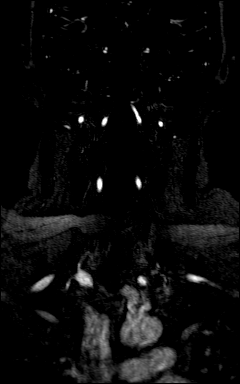
[im 46/104]
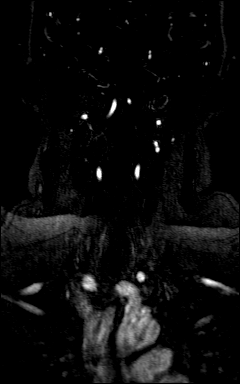
[im 52/104]
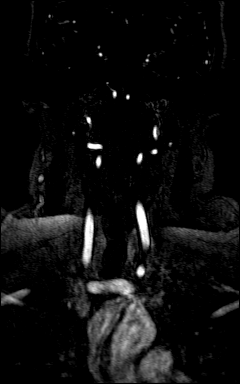
[im 58/104]
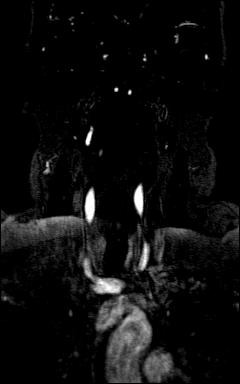
[im 63/104]
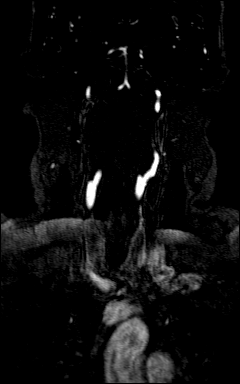
[im 69/104]
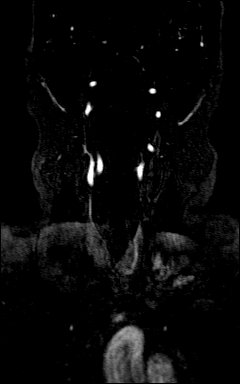
[im 75/104]
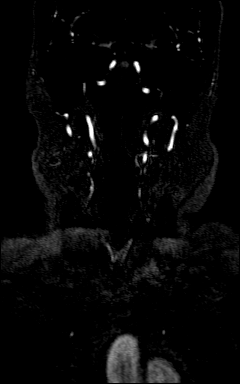
[im 86/104]
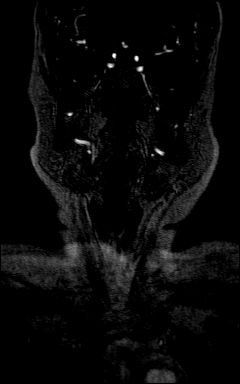
[im 98/104]
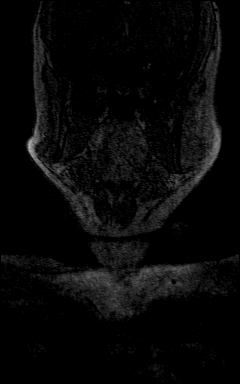

[Series 11: (id)_tt=1.0s_sub · coronal · 0.8mm · 0.78mm/px · 9 of 104 slices shown]
[im 6/104]
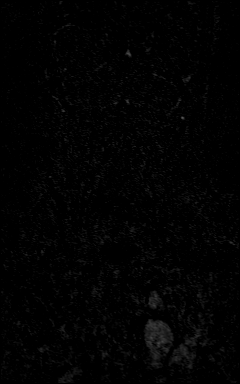
[im 18/104]
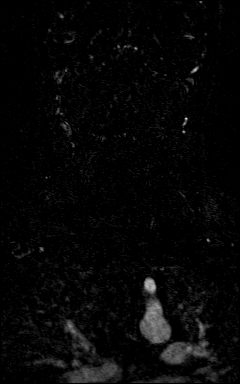
[im 29/104]
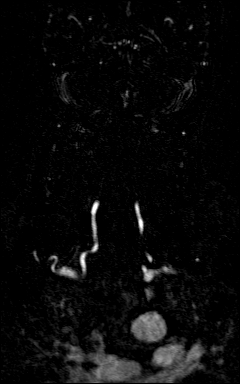
[im 46/104]
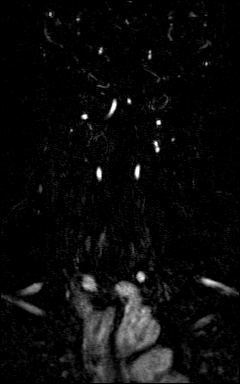
[im 52/104]
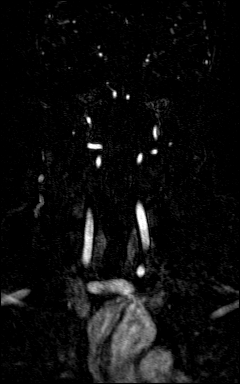
[im 58/104]
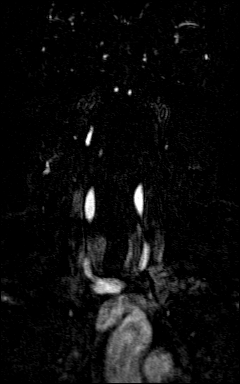
[im 75/104]
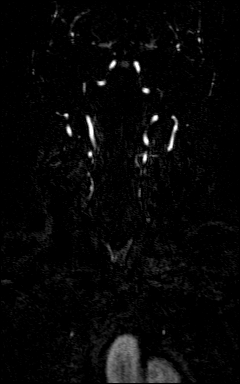
[im 86/104]
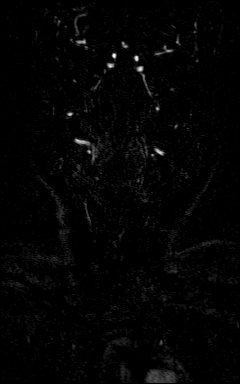
[im 98/104]
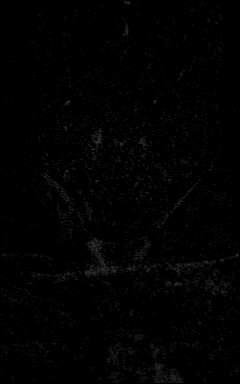

[35 of 48 positions shown; findings below may reference images not displayed]

FINDINGS: Aortic arch: Normal 3 vessel aortic branching pattern. The
visualized subclavian arteries are normal.

Right carotid system: Normal course and caliber without stenosis or
evidence of dissection.

Left carotid system: Mild atherosclerotic changes of the left
carotid bifurcation, without hemodynamically significant stenosis.

Vertebral arteries: Codominance. Vertebral artery origins are
normal. Vertebral arteries are normal in caliber to the
vertebrobasilar confluence without stenosis or evidence of
dissection. Mildly increased tortuosity of the V1 segment
bilaterally.
IMPRESSION: 1. No evidence of vertebral artery stenosis or dissection.
2. Mild atherosclerotic changes of the left carotid bifurcation,
without hemodynamically significant stenosis.

## 2020-04-25 MED ORDER — GADOBENATE DIMEGLUMINE 529 MG/ML IV SOLN
20.0000 mL | Freq: Once | INTRAVENOUS | Status: AC | PRN
  Administered 2020-04-25: 20 mL via INTRAVENOUS

## 2020-04-30 MED FILL — BENAZEPRIL HCL 20 MG TABLET: 20 | 30 days supply | Qty: 30 | Fill #0

## 2020-05-11 ENCOUNTER — Ambulatory Visit: Payer: Self-pay | Attending: Family Medicine

## 2020-05-11 ENCOUNTER — Other Ambulatory Visit: Payer: Self-pay

## 2020-05-11 MED FILL — ESOMEPRAZOLE MAG DR 40 MG C: 40 | 30 days supply | Qty: 30 | Fill #1

## 2020-05-22 ENCOUNTER — Telehealth: Payer: Self-pay | Admitting: Family Medicine

## 2020-05-22 NOTE — Telephone Encounter (Signed)
Copied from [place] [id number]. Topic: General - Other >> May 22, 2020  2:28 PM Celene Kras wrote: Reason for CRM: Pt called and is requesting to speak with Clifton James. She states that she is waiting on the letter for [employer] to see if she can receive financial aid. She states that she has an appt tomorrow with specialty doctor and still has not received letter. Please advise.

## 2020-05-23 ENCOUNTER — Ambulatory Visit: Payer: No Typology Code available for payment source | Admitting: Gastroenterology

## 2020-05-24 NOTE — Telephone Encounter (Signed)
I return Pt call, print a copy of her CAFA letter and will be mail to the Pt

## 2020-05-30 MED FILL — BENAZEPRIL HCL 20 MG TABLET: 20 | 30 days supply | Qty: 30 | Fill #1

## 2020-06-04 ENCOUNTER — Ambulatory Visit: Payer: No Typology Code available for payment source | Admitting: Neurology

## 2020-06-06 MED FILL — BENAZEPRIL HCL 20 MG TABLET: 20 | 30 days supply | Qty: 30 | Fill #1

## 2020-06-27 ENCOUNTER — Other Ambulatory Visit: Payer: Self-pay | Admitting: Gastroenterology

## 2020-06-27 ENCOUNTER — Encounter: Payer: Self-pay | Admitting: Gastroenterology

## 2020-06-27 ENCOUNTER — Other Ambulatory Visit (INDEPENDENT_AMBULATORY_CARE_PROVIDER_SITE_OTHER): Payer: Self-pay

## 2020-06-27 ENCOUNTER — Ambulatory Visit (INDEPENDENT_AMBULATORY_CARE_PROVIDER_SITE_OTHER): Payer: No Typology Code available for payment source | Admitting: Gastroenterology

## 2020-06-27 VITALS — BP 124/88 | HR 90 | Ht 63.5 in | Wt 227.0 lb

## 2020-06-27 DIAGNOSIS — R1310 Dysphagia, unspecified: Secondary | ICD-10-CM

## 2020-06-27 DIAGNOSIS — K2100 Gastro-esophageal reflux disease with esophagitis, without bleeding: Secondary | ICD-10-CM

## 2020-06-27 DIAGNOSIS — R1319 Other dysphagia: Secondary | ICD-10-CM

## 2020-06-27 DIAGNOSIS — R748 Abnormal levels of other serum enzymes: Secondary | ICD-10-CM

## 2020-06-27 DIAGNOSIS — R197 Diarrhea, unspecified: Secondary | ICD-10-CM

## 2020-06-27 DIAGNOSIS — K8020 Calculus of gallbladder without cholecystitis without obstruction: Secondary | ICD-10-CM

## 2020-06-27 LAB — LIPASE: Lipase: 89 U/L — ABNORMAL HIGH (ref 11.0–59.0)

## 2020-06-27 LAB — AMYLASE: Amylase: 52 U/L (ref 27–131)

## 2020-06-27 MED ORDER — ESOMEPRAZOLE MAGNESIUM 20 MG PO PACK: 20.0000 mg | PACK | Freq: Every day | ORAL | 2 refills | Status: DC

## 2020-06-27 MED FILL — ESOMEPRAZOLE MAG DR 20 MG C: 20 | 30 days supply | Qty: 30 | Fill #0

## 2020-06-27 NOTE — Patient Instructions (Addendum)
Decrease your Nexium to 20mg  once daily.   We have sent the following medications to your pharmacy for you to pick up at your convenience: Nexium   Start Fiber Con ( 1-2 tablets daily)  Or  Metamucil daily for a 2 week trial.   A high fiber diet with plenty of fluids (up to 8 glasses of water daily) is suggested to relieve these symptoms.  Metamucil, 1 tablespoon once or twice daily can be used to keep bowels regular if needed.   Your provider has requested that you go to the basement level for lab work before leaving today. Press "B" on the elevator. The lab is located at the first door on the left as you exit the elevator.  Due to recent changes in healthcare laws, you may see the results of your imaging and laboratory studies on MyChart before your provider has had a chance to review them.  We understand that in some cases there may be results that are confusing or concerning to you. Not all laboratory results come back in the same time frame and the provider may be waiting for multiple results in order to interpret others.  Please give [place] 48 hours in order for your provider to thoroughly review all the results before contacting the office for clarification of your results.   Thank you for choosing me and [facility].  Dr. Rush Landmark

## 2020-06-28 ENCOUNTER — Other Ambulatory Visit: Payer: No Typology Code available for payment source

## 2020-06-28 DIAGNOSIS — R1310 Dysphagia, unspecified: Secondary | ICD-10-CM

## 2020-06-28 DIAGNOSIS — R197 Diarrhea, unspecified: Secondary | ICD-10-CM

## 2020-06-28 DIAGNOSIS — K2100 Gastro-esophageal reflux disease with esophagitis, without bleeding: Secondary | ICD-10-CM

## 2020-06-28 DIAGNOSIS — R748 Abnormal levels of other serum enzymes: Secondary | ICD-10-CM

## 2020-07-02 MED FILL — BENAZEPRIL HCL 20 MG TABLET: 20 | 30 days supply | Qty: 30 | Fill #2

## 2020-07-03 ENCOUNTER — Encounter: Payer: Self-pay | Admitting: Gastroenterology

## 2020-07-03 ENCOUNTER — Telehealth: Payer: Self-pay | Admitting: Neurology

## 2020-07-03 DIAGNOSIS — R197 Diarrhea, unspecified: Secondary | ICD-10-CM | POA: Insufficient documentation

## 2020-07-03 DIAGNOSIS — R748 Abnormal levels of other serum enzymes: Secondary | ICD-10-CM | POA: Insufficient documentation

## 2020-07-03 DIAGNOSIS — K8020 Calculus of gallbladder without cholecystitis without obstruction: Secondary | ICD-10-CM | POA: Insufficient documentation

## 2020-07-03 LAB — PANCREATIC ELASTASE, FECAL: Pancreatic Elastase-1, Stool: >500 mcg/g

## 2020-07-03 NOTE — Telephone Encounter (Signed)
Pt advised of her Results.

## 2020-07-03 NOTE — Telephone Encounter (Signed)
The MRA does not reveal any vascular cause of dizziness. I think the vertigo is either inner ear problem or related to her neck problems.  Management would be physical therapy and management of neck pain.  She may follow up to discuss further.

## 2020-07-03 NOTE — Progress Notes (Signed)
[facility] VISIT   Primary Care Provider Charlott Rakes, MD [place] [address] [phone]  Referring Provider Charlott Rakes, MD [address] [place],  [place] [address] [phone]  Patient Profile: Christina Burke is a 60 y.o. female with a pmh significant for uterine cancer survivor, hypertension, hyperlipidemia, obesity, anxiety, GERD, esophageal stricture (status post dilations), family history of colon polyps, personal history of colon polyps (TAs).  The patient presents to the [facility] for an evaluation and management of problem(s) noted below:  Problem List 1. Esophageal dysphagia   2. Gastroesophageal reflux disease with esophagitis without hemorrhage   3. Intermittent diarrhea   4. Elevated lipase   5. Calculus of gallbladder without cholecystitis without obstruction     History of Present Illness Please see initial consultation note for full details of HPI.  Interval History The patient returns for scheduled follow-up.  I last saw the patient at the end of October for her endoscopy and colonoscopy.  Endoscopy showed evidence of an esophageal stricture for which we performed endoscopic dilation.  She also had evidence of grade B esophagitis and was placed on high-dose PPI therapy.  Tubular adenomas were found on her colonoscopy.  The patient states that she has had a significant improvement in her dysphagia symptoms since her dilation.  It is infrequent that the patient has issues from a swallowing perspective at this time.  The patient states that she has good control of her GERD symptoms at this time on current PPI dosing (she is on 40 mg once daily at this time).  The patient has had some episodes of intermittent loose stool which she attributes to her diet and she can mostly control this but wonders if there is something more that could be causing issues.  She denies any blood in her stools  (melena/hematochezia).  She occasionally will still have right-sided flank discomfort that comes and goes but it is been less frequent at this time.  Overall she feels much better than she did in the past.  GI Review of Systems Positive as above Negative for odynophagia, dysphagia, nausea, vomiting, early satiety  Review of Systems General: Denies fevers/chills/weight loss unintentionally Cardiovascular: Denies chest pain/palpitations Pulmonary: Denies shortness of breath Gastroenterological: See HPI Genitourinary: Denies darkened urine Hematological: Denies easy bruising/bleeding Dermatological: Denies jaundice Psychological: Mood is stable   Medications Current Outpatient Medications  Medication Sig Dispense Refill  . benazepril (LOTENSIN) 20 MG tablet Take 1 tablet (20 mg total) by mouth daily. 30 tablet 6  . clonazePAM (KLONOPIN) 1 MG tablet Take 1 mg by mouth 2 (two) times daily.    . cyclobenzaprine (FLEXERIL) 5 MG tablet Take 1 tablet (5 mg total) by mouth 3 (three) times daily as needed for muscle spasms. 30 tablet 3  . diclofenac Sodium (VOLTAREN) 1 % GEL Apply 2 g topically 4 (four) times daily. 100 g 3  . DULoxetine (CYMBALTA) 60 MG capsule Take 1 capsule (60 mg total) by mouth 2 (two) times daily. For back pain 60 capsule 3  . esomeprazole (NEXIUM) 20 MG packet Take 20 mg by mouth daily before breakfast. 30 each 2  . fluticasone (FLONASE) 50 MCG/ACT nasal spray Place 2 sprays into both nostrils daily. 16 g 6  . gabapentin (NEURONTIN) 300 MG capsule Take 1 capsule (300 mg total) by mouth 3 (three) times daily. 90 capsule 0  . lidocaine (LIDODERM) 5 % Place 1 patch onto the skin daily. Remove & Discard patch within  12 hours or as directed by MD 30 patch 3  . meclizine (ANTIVERT) 25 MG tablet Take 1 tablet (25 mg total) by mouth 3 (three) times daily as needed for dizziness. 30 tablet 0  . methocarbamol (ROBAXIN) 500 MG tablet Take 1 tablet (500 mg total) by mouth every 8  (eight) hours as needed for muscle spasms. 60 tablet 6  . ondansetron (ZOFRAN ODT) 4 MG disintegrating tablet Take 1 tablet (4 mg total) by mouth every 8 (eight) hours as needed for nausea or vomiting. 20 tablet 0  . tizanidine (ZANAFLEX) 2 MG capsule Take 1 capsule (2 mg total) by mouth at bedtime as needed for muscle spasms. 30 capsule 0  . Vitamin D, Ergocalciferol, (DRISDOL) 1.25 MG (50000 UNIT) CAPS capsule Take 1 capsule (50,000 Units total) by mouth every 7 (seven) days. 16 capsule 0  . zinc gluconate 50 MG tablet Take 50 mg by mouth daily.     Current Facility-Administered Medications  Medication Dose Route Frequency Provider Last Rate Last Admin  . 0.9 %  sodium chloride infusion  500 mL Intravenous Once Mansouraty, Telford Nab., MD        Allergies Allergies  Allergen Reactions  . Amoxicillin Rash    Has patient had a PCN reaction causing immediate rash, facial/tongue/throat swelling, SOB or lightheadedness with hypotension: yes Has patient had a PCN reaction causing severe rash involving mucus membranes or skin necrosis: no Has patient had a PCN reaction that required hospitalization: no Has patient had a PCN reaction occurring within the last 10 years: no If all of the above answers are "NO", then may proceed with Cephalosporin use.   Marland Kitchen Penicillins Rash    Histories Past Medical History:  Diagnosis Date  . Anxiety   . Cancer ([place])    pt. reports had uterine cancer x 10 years ago  . Chest pain 12/08/2013  . GERD (gastroesophageal reflux disease) 08/10/2014  . Hyperlipidemia   . Hypertension   . Murmur 12/08/2013   Past Surgical History:  Procedure Laterality Date  . ABDOMINAL HYSTERECTOMY    . ABDOMINAL SURGERY    . COLONOSCOPY    . UPPER GASTROINTESTINAL ENDOSCOPY  4/21/216   Social History   Socioeconomic History  . Marital status: Single    Spouse name: Not on file  . Number of children: 2  . Years of education: Not on file  . Highest education level: Some  college, no degree  Occupational History  . Occupation: Unemployed  Tobacco Use  . Smoking status: Never Smoker  . Smokeless tobacco: Never Used  Vaping Use  . Vaping Use: Never used  Substance and Sexual Activity  . Alcohol use: Yes    Alcohol/week: 1.0 - 2.0 standard drink    Types: 1 - 2 Cans of beer per week    Comment: a night  . Drug use: No  . Sexual activity: Yes    Birth control/protection: None  Other Topics Concern  . Not on file  Social History Narrative   Right handed   Social Determinants of Health   Financial Resource Strain: Not on file  Food Insecurity: Not on file  Transportation Needs: No Transportation Needs  . Lack of Transportation (Medical): No  . Lack of Transportation (Non-Medical): No  Physical Activity: Not on file  Stress: Not on file  Social Connections: Not on file  Intimate Partner Violence: Not on file   Family History  Problem Relation Age of Onset  . Gallbladder disease Mother   .  Heart disease Father   . Lung cancer Father   . Brain cancer Father   . Colon polyps Sister   . Diabetes Sister        x3  . Diabetes Brother   . Breast cancer Paternal Aunt   . Colon cancer Neg Hx   . Kidney disease Neg Hx   . Esophageal cancer Neg Hx   . Inflammatory bowel disease Neg Hx   . Liver disease Neg Hx   . Pancreatic cancer Neg Hx   . Rectal cancer Neg Hx   . Stomach cancer Neg Hx    I have reviewed her medical, social, and family history in detail and updated the electronic medical record as necessary.    PHYSICAL EXAMINATION  BP 124/88   Pulse 90   Ht 5' 3.5" (1.613 m)   Wt 227 lb (103 kg)   BMI 39.58 kg/m  Wt Readings from Last 3 Encounters:  06/27/20 227 lb (103 kg)  04/15/20 220 lb (99.8 kg)  04/04/20 222 lb 3.2 oz (100.8 kg)  GEN: NAD, appears stated age, doesn't appear chronically ill PSYCH: Cooperative, without pressured speech EYE: Conjunctivae pink, sclerae anicteric ENT: Masked CV: Nontachycardic RESP: No audible  wheezing GI: NABS, soft, protuberant, rounded, NT/ND, without rebound MSK/EXT: No lower extremity edema SKIN: No jaundice NEURO:  Alert & Oriented x 3, no focal deficits   REVIEW OF DATA  I reviewed the following data at the time of this encounter:  GI Procedures and Studies  October 2021 EGD - No gross lesions in esophagus proximally. LA Grade B esophagitis with no bleeding distally and at GE Jxn. Benign-appearing esophageal stenosis at GE Jxn - Dilated. Esophageal biopsies obtained. - Erythematous mucosa in the gastric body and antrum. No other gross lesions in the stomach. Biopsied. - No gross lesions in the duodenal bulb, in the first portion of the duodenum and in the second portion of the duodenum. Biopsied.  October 2021 colonoscopy - Hemorrhoids found on digital rectal exam. - The examined portion of the ileum was normal. - One 4 mm polyp in the ascending colon, removed with a cold snare. Resected and retrieved. - Two 2 to 3 mm polyps in the rectum, removed with a cold snare. Resected and retrieved. - Normal mucosa in the entire examined colon otherwise. - Non-bleeding non-thrombosed internal hemorrhoids.  Pathology Diagnosis 1. Surgical [P], duodenum - DUODENAL MUCOSA WITH NO SPECIFIC HISTOPATHOLOGIC CHANGES - NEGATIVE FOR INCREASED INTRAEPITHELIAL LYMPHOCYTES OR VILLOUS ARCHITECTURAL CHANGES 2. Surgical [P], random gastric sites - GASTRIC OXYNTIC MUCOSA WITH NO SPECIFIC HISTOPATHOLOGIC CHANGES - WARTHIN STARRY STAIN IS NEGATIVE FOR HELICOBACTER PYLORI 3. Surgical [P], random esophageal sites - ESOPHAGEAL SQUAMOUS MUCOSA WITH NO SPECIFIC HISTOPATHOLOGIC CHANGES - NEGATIVE FOR INCREASED INTRAEPITHELIAL EOSINOPHILS 4. Surgical [P], colon, rectum and ascending, polyps (3) - TUBULAR ADENOMA WITHOUT HIGH-GRADE DYSPLASIA OR MALIGNANCY - HYPERPLASTIC POLYP - OTHER FRAGMENT OF POLYPOID COLONIC MUCOSA WITH NO SPECIFIC HISTOPATHOLOGIC CHANGES  Laboratory Studies  Reviewed  those in epic  Imaging Studies  November 2021 CT abdomen with contrast IMPRESSION: No radiographic evidence of pancreatitis or other acute findings. Cholelithiasis. No radiographic evidence of cholecystitis. Mild hepatic steatosis. Aortic Atherosclerosis (ICD10-I70.0).   ASSESSMENT  Ms. Frommer is a 60 y.o. female with a pmh significant for uterine cancer survivor, hypertension, hyperlipidemia, obesity, anxiety, GERD, esophageal stricture (status post dilations), family history of colon polyps, personal history of colon polyps (TAs).  The patient is seen today for evaluation and management of:  1. Esophageal  dysphagia   2. Gastroesophageal reflux disease with esophagitis without hemorrhage   3. Intermittent diarrhea   4. Elevated lipase   5. Calculus of gallbladder without cholecystitis without obstruction    The patient is hemodynamically and clinically stable at this time.  She had significant improvement in her dysphagia symptoms and GERD symptoms on her current PPI therapy and status post dilation.  If the patient's dysphagia remains well treated then repeat dilation in the future can be considered if she has recurrence of symptoms and I would perform that before I would perform and a manometry.  We had discussed considering repeating in upper endoscopy to ensure healing of the esophagus and repeat dilation but the patient and I have discussed that since she is doing so well and not having significant symptoms that we will forego that at this time.  We will begin to further decrease her PPI therapy to get her on the lowest effective dose and will go down to 20 mg once daily.  Regards to the patient's intermittent episodes of loose stool, this remains unclear.  At some point in the future we can consider a flexible sigmoidoscopy to rule out microscopic/collagenous colitis.  SIBO as well as EPI should also be considered and we will try to rule out UTI with fecal elastase testing.  We will hold  on SIBO breath test per patient request at this time but consider in the future.  We had previously discussed for her to be on FiberCon or Metamucil and in the past she was experiencing issues with constipation but with intermittent episodes of diarrhea/loose stools we will try to make sure she begins FiberCon or Metamucil on a daily basis.  Although her symptoms are not overt for biliary colic, she does have cholelithiasis.  If she has recurrence of right upper quadrant abdominal discomfort in the setting of the previous elevated lipase, we would likely consider evaluation by surgery for consideration of cholecystectomy.  I am not sure that a CCK HIDA is not an unreasonable diagnostic imaging to consider if symptoms of right upper quadrant pain recur but surgical evaluation may be considered in the future.  All patient questions were answered to the best of my ability, and the patient agrees to the aforementioned plan of action with follow-up as indicated.   PLAN  Laboratories as outlined below Decrease Nexium to 20 mg daily We will hold on repeat EGD for esophagitis healing due to patient doing well Consider repeat EGD with dilation if dysphagia symptoms recur Initiate FiberCon or Metamucil daily for bulking Fecal elastase to be obtained Consider SIBO breath testing in future If intermittent diarrhea symptoms occur more frequently we will consider flexible sigmoidoscopy to rule out microscopic/collagenous colitis If episodes of right upper quadrant abdominal discomfort recur more frequently will consider in the setting of elevated lipase and known cholelithiasis surgical referral for cholecystectomy (although typical biliary colic is not present at this time)   Orders Placed This Encounter  Procedures  . Amylase  . Lipase  . Pancreatic elastase, fecal    New Prescriptions   ESOMEPRAZOLE (NEXIUM) 20 MG PACKET    Take 20 mg by mouth daily before breakfast.   Modified Medications   No  medications on file    Planned Follow Up Return in about 3 months (around 09/27/2020).   Total Time in Face-to-Face and in Coordination of Care for patient including independent/personal interpretation/review of prior testing, medical history, examination, medication adjustment, communicating results with the patient directly, and documentation  with the EHR is 30 minutes.  Justice Britain, MD [facility] Advanced [facility]oscopy Office # [phone]

## 2020-07-03 NOTE — Telephone Encounter (Signed)
Patient called in stating she was not sure if she needed to follow up with Dr. Tomi Likens after having her MRI on 04/25/20? She is also confused on her diagnosis from that MRI.

## 2020-07-28 ENCOUNTER — Other Ambulatory Visit: Payer: Self-pay

## 2020-08-14 MED FILL — Benazepril HCl Tab 20 MG: ORAL | 30 days supply | Qty: 30 | Fill #0 | Status: AC

## 2020-08-15 ENCOUNTER — Other Ambulatory Visit: Payer: Self-pay

## 2020-09-20 ENCOUNTER — Other Ambulatory Visit: Payer: Self-pay

## 2020-09-20 MED FILL — Benazepril HCl Tab 20 MG: ORAL | 30 days supply | Qty: 30 | Fill #1 | Status: AC

## 2020-09-26 ENCOUNTER — Other Ambulatory Visit: Payer: Self-pay

## 2020-10-26 ENCOUNTER — Other Ambulatory Visit: Payer: Self-pay

## 2020-10-26 MED FILL — Benazepril HCl Tab 20 MG: ORAL | 30 days supply | Qty: 30 | Fill #2 | Status: AC

## 2020-11-13 ENCOUNTER — Ambulatory Visit: Payer: Self-pay | Attending: Family Medicine | Admitting: Family Medicine

## 2020-11-13 ENCOUNTER — Other Ambulatory Visit: Payer: Self-pay

## 2020-11-13 ENCOUNTER — Other Ambulatory Visit: Payer: Self-pay | Admitting: Family Medicine

## 2020-11-13 ENCOUNTER — Ambulatory Visit: Payer: Self-pay

## 2020-11-13 DIAGNOSIS — Z13228 Encounter for screening for other metabolic disorders: Secondary | ICD-10-CM

## 2020-11-13 DIAGNOSIS — F419 Anxiety disorder, unspecified: Secondary | ICD-10-CM

## 2020-11-13 DIAGNOSIS — I10 Essential (primary) hypertension: Secondary | ICD-10-CM

## 2020-11-13 DIAGNOSIS — R252 Cramp and spasm: Secondary | ICD-10-CM

## 2020-11-13 NOTE — Progress Notes (Signed)
Virtual Visit via Video Note  I connected with Christina Burke, on 11/13/2020 at 2:06 PM by video enabled telemedicine device due to the COVID-19 pandemic and verified that I am speaking with the correct person using two identifiers.   Consent: I discussed the limitations, risks, security and privacy concerns of performing an evaluation and management service by telemedicine and the availability of in person appointments. I also discussed with the patient that there may be a patient responsible charge related to this service. The patient expressed understanding and agreed to proceed.   Location of Patient: Home  Location of Provider: Clinic   Persons participating in Telemedicine visit: Christina Burke Dr. 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   Compliant with her antihypertensive and her anxiety is managed by psychiatry. She endorses gaining weight as she has been very sedentary.  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 Sometimes her arms are achy and she finds herself turning from 1 side to the other for relief.  Denies history of trauma.  Pain is moderate.  Currently not taking any analgesics for her symptoms.  But states she is doing natural things. Would like to try the keto diet to assist in weight loss  Past Medical History:  Diagnosis Date   Anxiety    Cancer ([employer])    pt. reports had uterine cancer x 10 years ago   Chest pain 12/08/2013   GERD (gastroesophageal reflux disease) 08/10/2014   Hyperlipidemia    Hypertension    Murmur 12/08/2013   Allergies  Allergen Reactions   Amoxicillin Rash    Has patient had a PCN reaction causing immediate rash, facial/tongue/throat swelling, SOB or  lightheadedness with hypotension: yes Has patient had a PCN reaction causing severe rash involving mucus membranes or skin necrosis: no Has patient had a PCN reaction that required hospitalization: no Has patient had a PCN reaction occurring within the last 10 years: no If all of the above answers are "NO", then may proceed with Cephalosporin use.    Penicillins Rash    Current Outpatient Medications on File Prior to Visit  Medication Sig Dispense Refill   benazepril (LOTENSIN) 20 MG tablet TAKE 1 TABLET (20 MG TOTAL) BY MOUTH DAILY. 30 tablet 6   clonazePAM (KLONOPIN) 1 MG tablet Take 1 mg by mouth 2 (two) times daily.     cyclobenzaprine (FLEXERIL) 5 MG tablet Take 1 tablet (5 mg total) by mouth 3 (three) times daily as needed for muscle spasms. 30 tablet 3   diclofenac Sodium (VOLTAREN) 1 % GEL Apply 2 g topically 4 (four) times daily. 100 g 3   DULoxetine (CYMBALTA) 60 MG capsule Take 1 capsule (60 mg total) by mouth 2 (two) times daily. For back pain 60 capsule 3   esomeprazole (NEXIUM) 20 MG capsule TAKE 1 CAPSULE BY MOUTH DAILY BEFORE BREAKFAST. 30 capsule 2   esomeprazole (NEXIUM) 20 MG packet Take 20 mg by mouth daily before breakfast. 30 each 2   fluticasone (FLONASE) 50 MCG/ACT nasal spray Place 2 sprays into both nostrils daily. 16 g 6   gabapentin (NEURONTIN) 300 MG capsule Take 1 capsule (300 mg total) by mouth 3 (three) times daily. 90 capsule 0   lidocaine (LIDODERM) 5 % Place 1 patch  onto the skin daily. Remove & Discard patch within 12 hours or as directed by MD 30 patch 3   meclizine (ANTIVERT) 25 MG tablet TAKE 1 TABLET (25 MG TOTAL) BY MOUTH 3 (THREE) TIMES DAILY AS NEEDED FOR DIZZINESS. 30 tablet 0   methocarbamol (ROBAXIN) 500 MG tablet Take 1 tablet (500 mg total) by mouth every 8 (eight) hours as needed for muscle spasms. 60 tablet 6   ondansetron (ZOFRAN ODT) 4 MG disintegrating tablet Take 1 tablet (4 mg total) by mouth every 8 (eight) hours as needed for nausea or  vomiting. 20 tablet 0   tizanidine (ZANAFLEX) 2 MG capsule Take 1 capsule (2 mg total) by mouth at bedtime as needed for muscle spasms. 30 capsule 0   Vitamin D, Ergocalciferol, (DRISDOL) 1.25 MG (50000 UNIT) CAPS capsule Take 1 capsule (50,000 Units total) by mouth every 7 (seven) days. 16 capsule 0   zinc gluconate 50 MG tablet Take 50 mg by mouth daily.     Current Facility-Administered Medications on File Prior to Visit  Medication Dose Route Frequency Provider Last Rate Last Admin   0.9 %  sodium chloride infusion  500 mL Intravenous Once Mansouraty, Telford Nab., MD        ROS: See HPI  Observations/Objective: Awake, alert, oriented x3 Not in acute distress Neck-no JVD Respiratory-normal breathing Extremities-full range of motion of upper extremities.  No tenderness to palpation of upper extremities palpation.  The 10-year ASCVD risk score Mikey Bussing DC Brooke Bonito., et al., 2013) is: 4.1%   Values used to calculate the score:     Age: 60 years     Sex: Female     Is Non-Hispanic African American: No     Diabetic: No     Tobacco smoker: No     Systolic Blood Pressure: 132 mmHg     Is BP treated: Yes     HDL Cholesterol: 63 mg/dL     Total Cholesterol: 224 mg/dL  Assessment and Plan: 1. Primary hypertension Controlled Continue benazepril Will check renal function Counseled on blood pressure goal of less than 130/80, low-sodium, DASH diet, medication compliance, 150 minutes of moderate intensity exercise per week. Discussed medication compliance, adverse effects.  2. Muscle cramps She has a muscle relaxant but likes uses sparingly Counseled on exercises like yoga, water aerobics and she will be looking into this We have discussed an exercise regimen whereby she would start walking 15 minutes daily and build this up gradually as this might help her pain  3. Anxiety Controlled Continue Klonopin per psychiatrist  4.  Hyperlipidemia She has a 10-year ASCVD risk of 4.1% Currently  not on a statin Discussed that in the light of wanting to commence a keto diet her cholesterol is a risk of trending up She would like to try the keto diet for 3 months and we will repeat her lipid panel at which time decision will be made regarding treating or not.  Follow Up Instructions: 3 months for chronic disease management   I discussed the assessment and treatment plan with the patient. The patient was provided an opportunity to ask questions and all were answered. The patient agreed with the plan and demonstrated an understanding of the instructions.   The patient was advised to call back or seek an in-person evaluation if the symptoms worsen or if the condition fails to improve as anticipated.     I provided 25 minutes total of Telehealth time during this encounter including median intraservice time, reviewing  previous notes, investigations, ordering medications, medical decision making, counseling on various modalities of exercise for pain management and risks and benefits of the keto diet and patient verbalized understanding at the end of the visit.     Charlott Rakes, MD, FAAFP. [facility] [place], [place]   11/13/2020, 2:06 PM

## 2020-11-14 ENCOUNTER — Encounter: Payer: Self-pay | Admitting: Family Medicine

## 2020-11-14 LAB — CMP14+EGFR
ALT: 19 IU/L (ref 0–32)
AST: 18 IU/L (ref 0–40)
Albumin/Globulin Ratio: 1.6 (ref 1.2–2.2)
Albumin: 4.5 g/dL (ref 3.8–4.9)
Alkaline Phosphatase: 83 IU/L (ref 44–121)
BUN/Creatinine Ratio: 16 (ref 12–28)
BUN: 13 mg/dL (ref 8–27)
Bilirubin Total: 0.3 mg/dL (ref 0.0–1.2)
CO2: 22 mmol/L (ref 20–29)
Calcium: 9.5 mg/dL (ref 8.7–10.3)
Chloride: 103 mmol/L (ref 96–106)
Creatinine, Ser: 0.8 mg/dL (ref 0.57–1.00)
Globulin, Total: 2.9 g/dL (ref 1.5–4.5)
Glucose: 112 mg/dL — ABNORMAL HIGH (ref 65–99)
Potassium: 4.4 mmol/L (ref 3.5–5.2)
Sodium: 140 mmol/L (ref 134–144)
Total Protein: 7.4 g/dL (ref 6.0–8.5)
eGFR: 84 mL/min/{1.73_m2} (ref >59)

## 2020-11-14 LAB — LIPID PANEL
Chol/HDL Ratio: 4.4 ratio (ref 0.0–4.4)
Cholesterol, Total: 201 mg/dL — ABNORMAL HIGH (ref 100–199)
HDL: 46 mg/dL (ref >39)
LDL Chol Calc (NIH): 118 mg/dL — ABNORMAL HIGH (ref 0–99)
Triglycerides: 212 mg/dL — ABNORMAL HIGH (ref 0–149)
VLDL Cholesterol Cal: 37 mg/dL (ref 5–40)

## 2020-11-14 LAB — HEMOGLOBIN A1C
Est. average glucose Bld gHb Est-mCnc: 126 mg/dL
Hgb A1c MFr Bld: 6 % — ABNORMAL HIGH (ref 4.8–5.6)

## 2020-11-27 ENCOUNTER — Other Ambulatory Visit: Payer: Self-pay | Admitting: Family Medicine

## 2020-11-27 ENCOUNTER — Other Ambulatory Visit: Payer: Self-pay

## 2020-11-27 ENCOUNTER — Telehealth (INDEPENDENT_AMBULATORY_CARE_PROVIDER_SITE_OTHER): Payer: Self-pay

## 2020-11-27 DIAGNOSIS — I10 Essential (primary) hypertension: Secondary | ICD-10-CM

## 2020-11-27 MED ORDER — BENAZEPRIL HCL 20 MG PO TABS
ORAL_TABLET | Freq: Every day | ORAL | 1 refills | Status: DC
  Filled 2020-11-27 – 2020-12-06 (×2): qty 30, 30d supply, fill #0
  Filled 2021-01-16: qty 30, 30d supply, fill #1

## 2020-11-27 NOTE — Telephone Encounter (Signed)
Medication Refill - Medication: benazepril (LOTENSIN) 20 MG tablet   Has the patient contacted their pharmacy? No. (Agent: If no, request that the patient contact the pharmacy for the refill.) (Agent: If yes, when and what did the pharmacy advise?)  Preferred Pharmacy (with phone number or street name):  [address] [place] [address]  Phone: [phone] Fax: [phone]    Agent: Please be advised that RX refills may take up to 3 business days. We ask that you follow-up with your pharmacy.

## 2020-11-27 NOTE — Telephone Encounter (Signed)
Attempted to reach patient. She did answer then after saying hello call disconnected. Please contact about mammogram. Per chart patient is not due until next year. Nat Christen, CMA    Copied from [place] [id number]. Topic: General - Call Back - No Documentation >> Nov 27, 2020 11:35 AM Erick Blinks wrote: Pt called requesting to speak to the office regarding her mammogram orders, she cant remember where she went but she was told that they did accept [facility] financial assistance. She is requesting assistance. Please advise   Best contact: [phone]

## 2020-11-29 ENCOUNTER — Other Ambulatory Visit: Payer: Self-pay

## 2020-11-29 DIAGNOSIS — N644 Mastodynia: Secondary | ICD-10-CM

## 2020-12-04 ENCOUNTER — Other Ambulatory Visit: Payer: Self-pay

## 2020-12-06 ENCOUNTER — Other Ambulatory Visit: Payer: Self-pay

## 2021-01-08 ENCOUNTER — Ambulatory Visit
Admission: RE | Admit: 2021-01-08 | Discharge: 2021-01-08 | Disposition: A | Discharge status: Home / Self Care | Payer: No Typology Code available for payment source | Source: Ambulatory Visit | Attending: Obstetrics and Gynecology | Admitting: Obstetrics and Gynecology

## 2021-01-08 ENCOUNTER — Other Ambulatory Visit: Payer: Self-pay

## 2021-01-08 ENCOUNTER — Ambulatory Visit: Payer: Self-pay | Admitting: *Deleted

## 2021-01-08 ENCOUNTER — Ambulatory Visit: Payer: No Typology Code available for payment source

## 2021-01-08 ENCOUNTER — Other Ambulatory Visit: Payer: Self-pay | Admitting: Obstetrics and Gynecology

## 2021-01-08 VITALS — BP 144/82 | Wt 217.4 lb

## 2021-01-08 DIAGNOSIS — Z01419 Encounter for gynecological examination (general) (routine) without abnormal findings: Secondary | ICD-10-CM

## 2021-01-08 DIAGNOSIS — N644 Mastodynia: Secondary | ICD-10-CM

## 2021-01-08 IMAGING — US US BREAST*R* LIMITED INC AXILLA
1 series · 2 of 2 positions shown · non-contrast
Comparison: Previous exam(s).

CLINICAL DATA: Patient presents for right nipple tenderness.

EXAM:
DIGITAL DIAGNOSTIC BILATERAL MAMMOGRAM WITH TOMOSYNTHESIS AND CAD;
ULTRASOUND RIGHT BREAST LIMITED
TECHNIQUE: Bilateral digital diagnostic mammography and breast tomosynthesis
was performed. The images were evaluated with computer-aided
detection.; Targeted ultrasound examination of the right breast was
performed

[Series 1: us breast*right* limited inc axilla · 0.07mm/px · 2 of 2 slices shown]
[im 1/2]
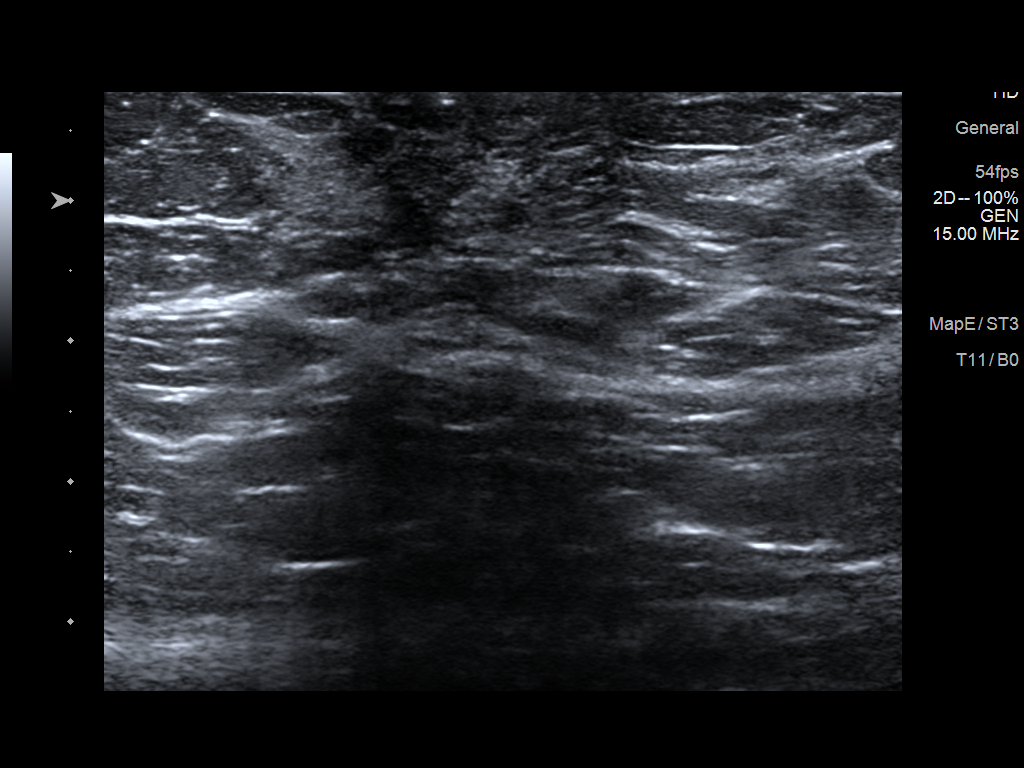
[im 2/2]
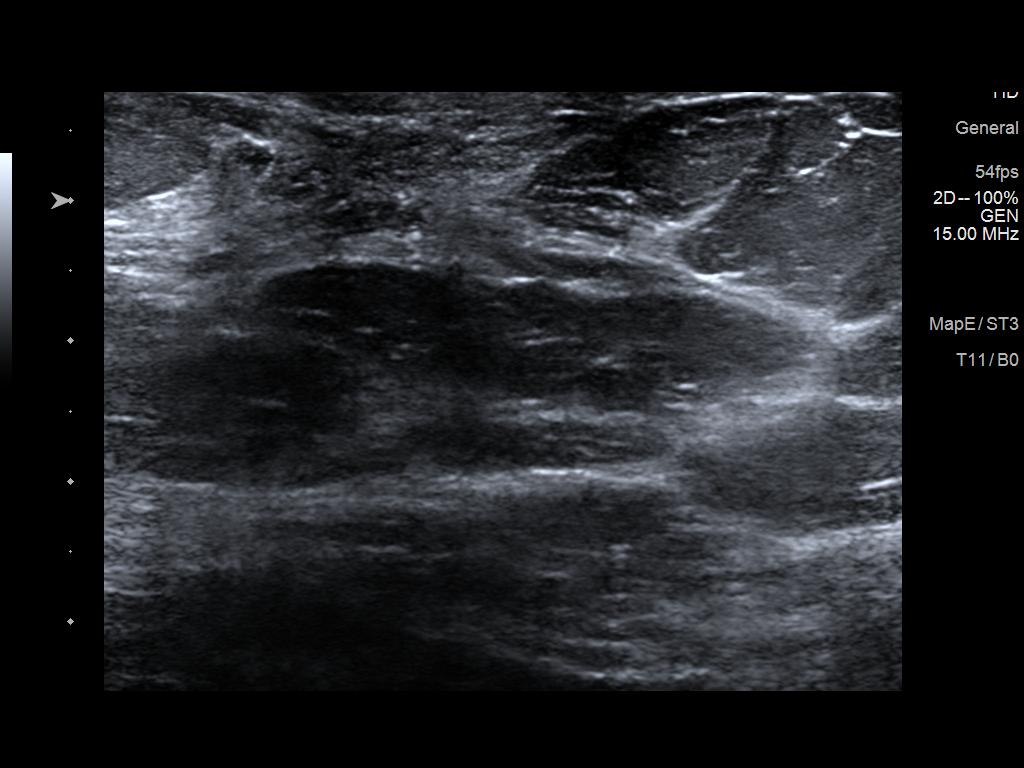

[2 of 2 positions shown; findings below may reference images not displayed]

ACR Breast Density Category b: There are scattered areas of
fibroglandular density.
FINDINGS: No concerning masses, calcifications or distortion identified within
either breast.

On physical exam, the right nipple areolar complex is normal in
appearance.

Targeted ultrasound is performed, showing normal tissue within the
retroareolar right breast.
IMPRESSION: No suspicious abnormality within either breast. No concerning
abnormality involving the right nipple areolar complex.

RECOMMENDATION:
Continued clinical evaluation for right nipple tenderness.

Screening mammogram in one year.(Code:[VO])

I have discussed the findings and recommendations with the patient.
If applicable, a reminder letter will be sent to the patient
regarding the next appointment.

BI-RADS CATEGORY  1: Negative.

## 2021-01-08 IMAGING — MG DIGITAL DIAGNOSTIC BILAT W/ TOMO W/ CAD
8 series · 8 of 24 positions shown · non-contrast
Comparison: Previous exam(s).

CLINICAL DATA: Patient presents for right nipple tenderness.

EXAM:
DIGITAL DIAGNOSTIC BILATERAL MAMMOGRAM WITH TOMOSYNTHESIS AND CAD;
ULTRASOUND RIGHT BREAST LIMITED
TECHNIQUE: Bilateral digital diagnostic mammography and breast tomosynthesis
was performed. The images were evaluated with computer-aided
detection.; Targeted ultrasound examination of the right breast was
performed

[R CC synth-2D]
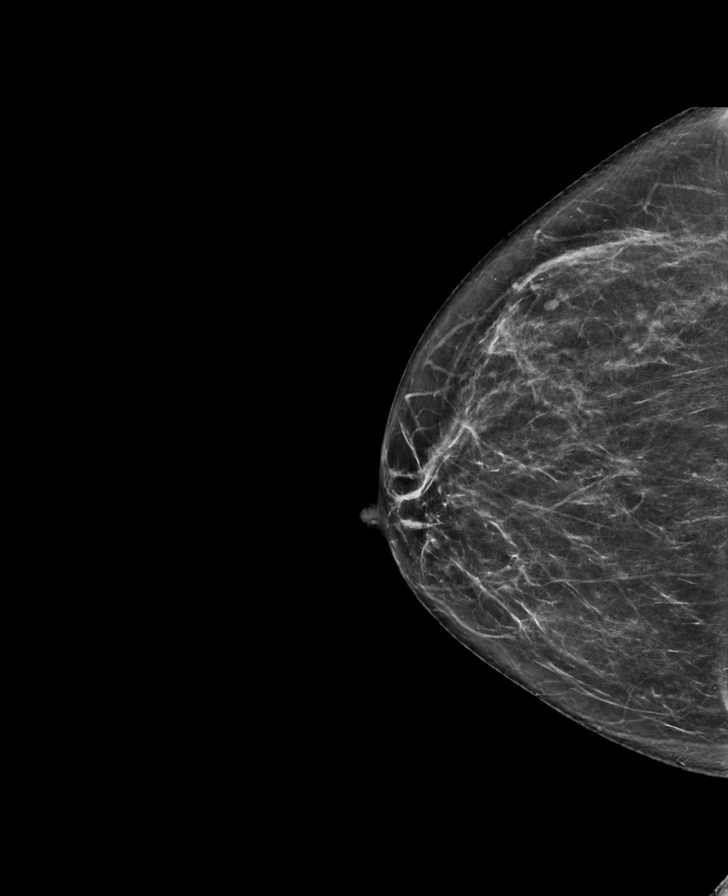

[L CC synth-2D]
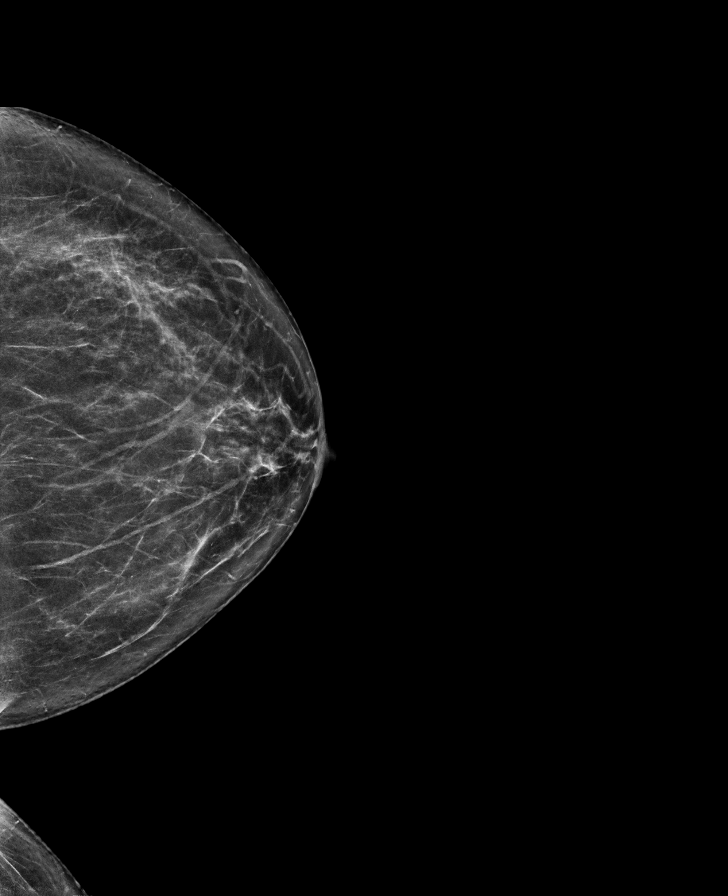

[R MLO synth-2D]
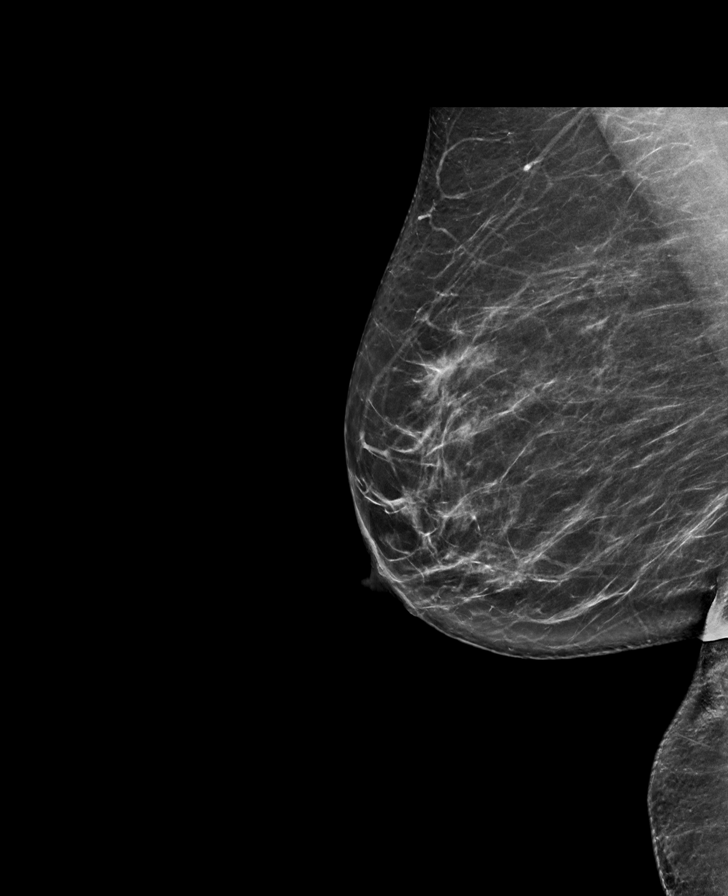

[L MLO synth-2D]
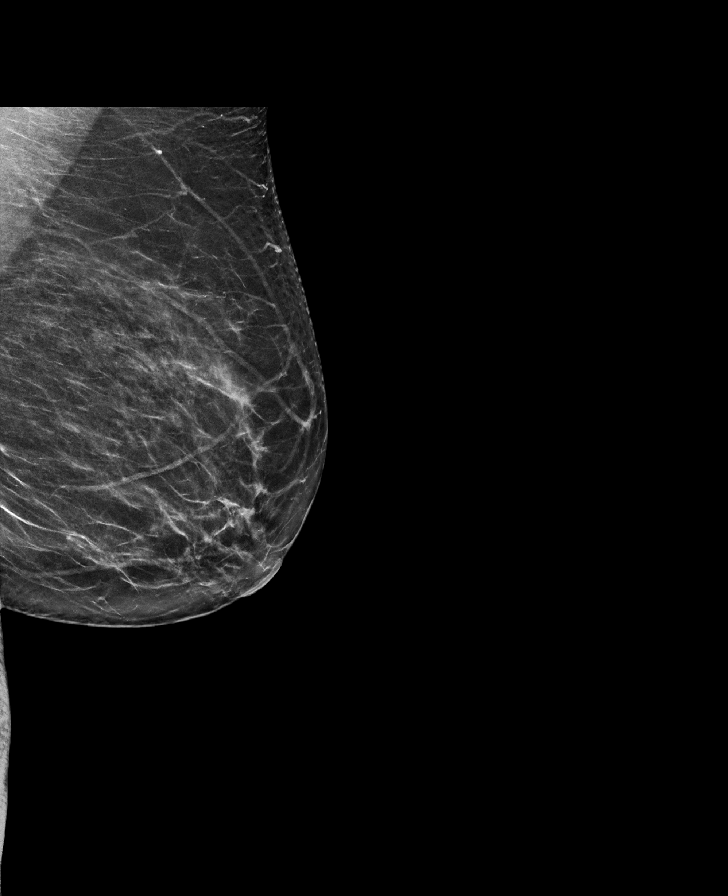

[L CC tomo · tomo slice 34/67.0]
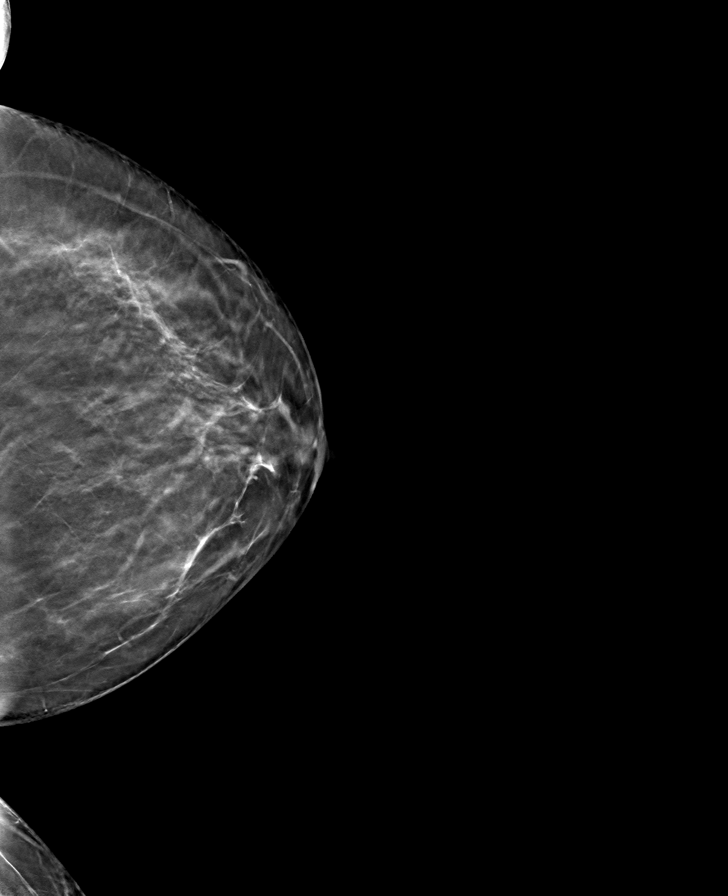

[L MLO tomo · tomo slice 37/73.0]
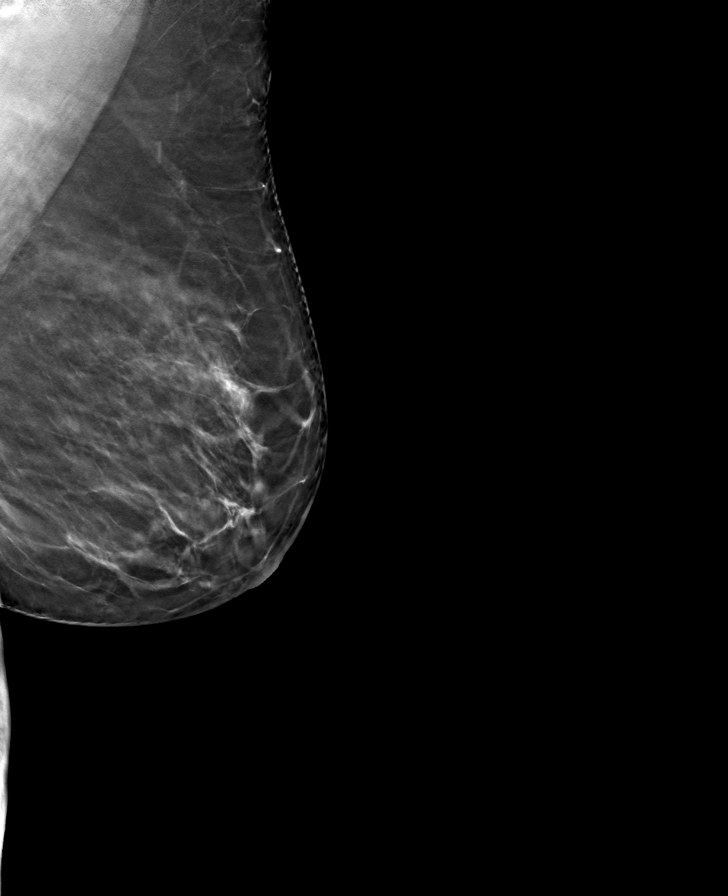

[R MLO tomo · tomo slice 39/76.0]
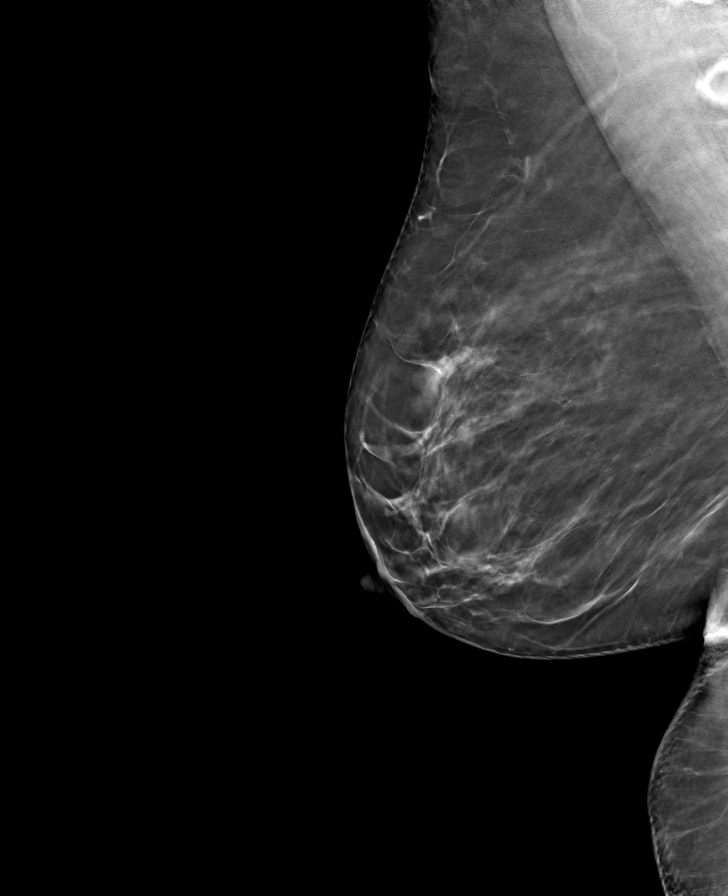

[R CC tomo · tomo slice 35/68.0]
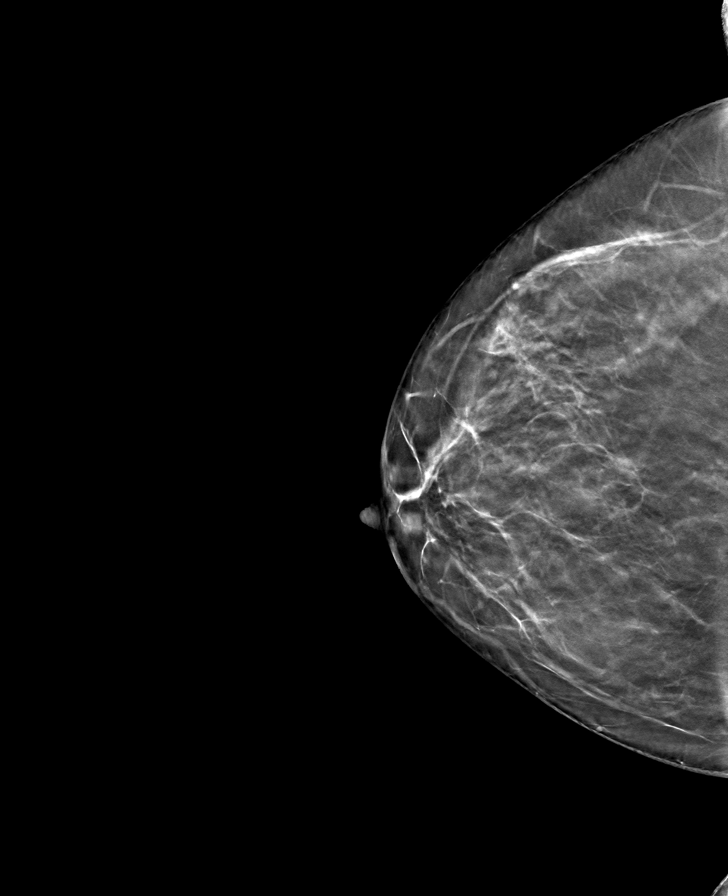

[8 of 24 positions shown; findings below may reference images not displayed]

ACR Breast Density Category b: There are scattered areas of
fibroglandular density.
FINDINGS: No concerning masses, calcifications or distortion identified within
either breast.

On physical exam, the right nipple areolar complex is normal in
appearance.

Targeted ultrasound is performed, showing normal tissue within the
retroareolar right breast.
IMPRESSION: No suspicious abnormality within either breast. No concerning
abnormality involving the right nipple areolar complex.

RECOMMENDATION:
Continued clinical evaluation for right nipple tenderness.

Screening mammogram in one year.(Code:[VO])

I have discussed the findings and recommendations with the patient.
If applicable, a reminder letter will be sent to the patient
regarding the next appointment.

BI-RADS CATEGORY  1: Negative.

## 2021-01-08 NOTE — Progress Notes (Addendum)
Christina Burke is a 60 y.o. [phone] female who presents to [facility] clinic today with complaint of right nipple pain x 2 months that has decreased over the past two weeks. Patient stated the pain started at a 5 out of 10 and decreased to a 3 out of 10. Patient stated she started water aerobics two months ago and that she has noticed some dryness.    Pap Smear: Pap smear completed today. Last Pap smear was 01/05/2020 at [facility] clinic and was normal with negative HPV. Per patient has no history of an abnormal Pap smear. Patient has a history of a hysterectomy for uterine cancer and AUB 02/04/2005. Last Pap smear and hysterectomy results are in Epic.   Physical exam: Breasts Breasts symmetrical. No skin abnormalities bilateral breasts. No nipple retraction bilateral breasts. No nipple discharge bilateral breasts. No lymphadenopathy. No lumps palpated bilateral breasts. Complaints of bilateral nipple tenderness on exam.      MM DIAG BREAST TOMO BILATERAL  Result Date: 12/31/2017 CLINICAL DATA:  60 year old female with history of non focal left breast pain which she no longer feels. EXAM: DIGITAL DIAGNOSTIC BILATERAL MAMMOGRAM WITH CAD AND TOMO COMPARISON:  Previous exam(s). ACR Breast Density Category b: There are scattered areas of fibroglandular density. FINDINGS: No suspicious masses or calcifications are seen in either breast. There is no mammographic evidence of malignancy in either breast. Mammographic images were processed with CAD. IMPRESSION: No mammographic evidence of malignancy in either breast. RECOMMENDATION: Screening mammogram in one year.(Code:SM-B-01Y) I have discussed the findings and recommendations with the patient. Results were also provided in writing at the conclusion of the visit. If applicable, a reminder letter will be sent to the patient regarding the next appointment. BI-RADS CATEGORY  1: Negative. Electronically Signed   By: Everlean Alstrom M.D.   On: 12/31/2017 10:56   MS  DIGITAL SCREENING TOMO BILATERAL  Result Date: 01/06/2020 CLINICAL DATA:  Screening. EXAM: DIGITAL SCREENING BILATERAL MAMMOGRAM WITH TOMO AND CAD COMPARISON:  Previous exam(s). ACR Breast Density Category b: There are scattered areas of fibroglandular density. FINDINGS: There are no findings suspicious for malignancy. Images were processed with CAD. IMPRESSION: No mammographic evidence of malignancy. A result letter of this screening mammogram will be mailed directly to the patient. RECOMMENDATION: Screening mammogram in one year. (Code:SM-B-01Y) BI-RADS CATEGORY  1: Negative. Electronically Signed   By: Kristopher Oppenheim M.D.   On: 01/06/2020 16:06    Pelvic/Bimanual Ext Genitalia No lesions, no swelling and no discharge observed on external genitalia.        Vagina Vagina pink and normal texture. No lesions or discharge observed in vagina.        Cervix Cervix is absent due to history of hysterectomy for uterine cancer.   Uterus Uterus is absent due to history of hysterectomy for uterine cancer.      Adnexae Bilateral ovaries present and palpable. No tenderness on palpation.         Rectovaginal No rectal exam completed today since patient had no rectal complaints. No skin abnormalities observed on exam.     Smoking History: Patient has never smoked.   Patient Navigation: Patient education provided. Access to services provided for patient through [facility] program.   Colorectal Cancer Screening: Per patient has had colonoscopy completed on 02/24/2020.  No complaints today.    Breast and Cervical Cancer Risk Assessment: Patient does not have family history of breast cancer. Patient has history of a paternal aunt that has had a breast lump that she  is unsure if cancer. Patient has no known genetic mutations or history of radiation treatment to the chest before age 34. Patient does not have history of cervical dysplasia, immunocompromised, or DES exposure in-utero.  Risk Assessment      Risk Scores       01/08/2021 01/05/2020   Last edited by: Demetrius Revel, LPN McGill, Sherie Mamie Nick, LPN   5-year risk: 2 % 1.9 %   Lifetime risk: 10 % 10.2 %            A: [facility] exam with pap smear Complaint of right nipple pain.  P: Referred patient to the [facility] for a diagnostic mammogram. Appointment scheduled Tuesday, January 08, 2021 at 1010.  Loletta Parish, RN 01/08/2021 8:48 AM

## 2021-01-08 NOTE — Patient Instructions (Signed)
Explained breast self awareness with Christina Burke. Pap smear completed today. Let patient know that her next Pap smear is due in one year due to her history of uterine cancer. Referred patient to the [facility] for a diagnostic mammogram. Appointment scheduled Tuesday, January 08, 2021 at 1010. Patient aware of appointment and will be there. Let patient know will follow up with her within the next couple weeks with results of her Pap smear by phone. Christina Burke verbalized understanding.  Christina Burke, Arvil Chaco, RN 8:47 AM

## 2021-01-09 ENCOUNTER — Telehealth: Payer: Self-pay | Admitting: Family Medicine

## 2021-01-09 DIAGNOSIS — E7800 Pure hypercholesterolemia, unspecified: Secondary | ICD-10-CM

## 2021-01-09 NOTE — Telephone Encounter (Signed)
Copied from [place] [id number]. Topic: General - Other >> Jan 09, 2021 10:41 AM Leward Quan A wrote: Reason for CRM: Patient inquiring of Geryl Rankins that since she need to have blood work and her appointment is in the afternoon so that she does not have to starve herself on that day can she please come in ahead of time and have blood work done. Please call patient at Ph# [phone]   Patient appt is not until October

## 2021-01-10 NOTE — Telephone Encounter (Signed)
Lab have been ordered which she can schedule with the Clinic at her convenience

## 2021-01-11 LAB — CYTOLOGY - PAP
Adequacy: ABSENT
Comment: NEGATIVE
Diagnosis: NEGATIVE
High risk HPV: NEGATIVE

## 2021-01-14 ENCOUNTER — Telehealth: Payer: Self-pay

## 2021-01-14 NOTE — Telephone Encounter (Signed)
Patient informed negative Pap/HPV results, given history, next pap due in 1 year. Patient verbalized understanding.

## 2021-01-16 ENCOUNTER — Ambulatory Visit: Payer: Self-pay | Attending: Family Medicine

## 2021-01-16 ENCOUNTER — Other Ambulatory Visit: Payer: Self-pay

## 2021-01-16 DIAGNOSIS — E7800 Pure hypercholesterolemia, unspecified: Secondary | ICD-10-CM

## 2021-01-17 ENCOUNTER — Other Ambulatory Visit: Payer: Self-pay | Admitting: Family Medicine

## 2021-01-17 ENCOUNTER — Other Ambulatory Visit: Payer: Self-pay

## 2021-01-17 LAB — LIPID PANEL
Chol/HDL Ratio: 5 ratio — ABNORMAL HIGH (ref 0.0–4.4)
Cholesterol, Total: 228 mg/dL — ABNORMAL HIGH (ref 100–199)
HDL: 46 mg/dL (ref >39)
LDL Chol Calc (NIH): 150 mg/dL — ABNORMAL HIGH (ref 0–99)
Triglycerides: 174 mg/dL — ABNORMAL HIGH (ref 0–149)
VLDL Cholesterol Cal: 32 mg/dL (ref 5–40)

## 2021-01-17 LAB — CMP14+EGFR
ALT: 27 IU/L (ref 0–32)
AST: 24 IU/L (ref 0–40)
Albumin/Globulin Ratio: 1.8 (ref 1.2–2.2)
Albumin: 4.9 g/dL (ref 3.8–4.9)
Alkaline Phosphatase: 80 IU/L (ref 44–121)
BUN/Creatinine Ratio: 14 (ref 12–28)
BUN: 13 mg/dL (ref 8–27)
Bilirubin Total: 0.4 mg/dL (ref 0.0–1.2)
CO2: 23 mmol/L (ref 20–29)
Calcium: 9.9 mg/dL (ref 8.7–10.3)
Chloride: 101 mmol/L (ref 96–106)
Creatinine, Ser: 0.9 mg/dL (ref 0.57–1.00)
Globulin, Total: 2.7 g/dL (ref 1.5–4.5)
Glucose: 104 mg/dL — ABNORMAL HIGH (ref 65–99)
Potassium: 4.3 mmol/L (ref 3.5–5.2)
Sodium: 141 mmol/L (ref 134–144)
Total Protein: 7.6 g/dL (ref 6.0–8.5)
eGFR: 73 mL/min/{1.73_m2} (ref >59)

## 2021-01-17 MED ORDER — ATORVASTATIN CALCIUM 20 MG PO TABS
20.0000 mg | ORAL_TABLET | Freq: Every day | ORAL | 3 refills | Status: DC
  Filled 2021-01-17: qty 30, 30d supply, fill #0

## 2021-01-22 ENCOUNTER — Other Ambulatory Visit: Payer: Self-pay

## 2021-01-22 ENCOUNTER — Encounter: Payer: Self-pay | Admitting: Family Medicine

## 2021-02-05 ENCOUNTER — Ambulatory Visit: Payer: Self-pay | Attending: Family Medicine | Admitting: Family Medicine

## 2021-02-05 ENCOUNTER — Other Ambulatory Visit: Payer: Self-pay

## 2021-02-05 VITALS — BP 113/74 | HR 90 | Ht 63.5 in | Wt 215.0 lb

## 2021-02-05 DIAGNOSIS — I10 Essential (primary) hypertension: Secondary | ICD-10-CM

## 2021-02-05 DIAGNOSIS — G4709 Other insomnia: Secondary | ICD-10-CM

## 2021-02-05 DIAGNOSIS — E7800 Pure hypercholesterolemia, unspecified: Secondary | ICD-10-CM

## 2021-02-05 DIAGNOSIS — I2510 Atherosclerotic heart disease of native coronary artery without angina pectoris: Secondary | ICD-10-CM

## 2021-02-05 MED ORDER — BENAZEPRIL HCL 20 MG PO TABS
ORAL_TABLET | Freq: Every day | ORAL | 1 refills | Status: DC
  Filled 2021-02-05: qty 90, fill #0
  Filled 2021-02-27: qty 90, 90d supply, fill #0
  Filled 2021-05-22: qty 30, 30d supply, fill #0
  Filled 2021-05-22: qty 90, 90d supply, fill #1
  Filled 2021-05-29: qty 90, 90d supply, fill #0

## 2021-02-05 NOTE — Patient Instructions (Signed)
Atherosclerosis Atherosclerosis is when plaque builds up in the arteries. This causes narrowing and hardening of the arteries. Arteries are blood vessels that carry blood from the heart to all parts of the body. This blood contains oxygen. Plaque occurs due to inflammation or from a buildup of fat, cholesterol, calcium, waste products of cells, and a clotting material in the blood (fibrin). Plaque decreases the amount of blood that can flow through the artery. Atherosclerosis can affect any artery in your body, including: Heart arteries. Damage to these arteries may lead to coronary artery disease, which can cause a heart attack. Brain arteries. Damage to these arteries may cause a stroke. Leg, arm, and pelvis arteries. Peripheral artery disease (PAD) may result from damage to these arteries. Kidney arteries. Kidney (renal) failure may result from damage to kidney arteries. Treatment may slow the disease and prevent further damage to your heart, brain, peripheral arteries, and kidneys. What are the causes? This condition develops slowly over many years. The inner layers of your arteries become damaged and allow the gradual buildup of plaque. The exact cause of atherosclerosis is not fully understood. Symptoms of atherosclerosis do not occur until an artery becomes narrow or blocked. What increases the risk? The following factors may make you more likely to develop this condition: Being middle-aged or older. Certain medical conditions, including: High blood pressure. High cholesterol. High blood fats (triglycerides). Diabetes. Sleep apnea. Obesity. Certain lab levels, including: Elevated C-reactive protein (CRP). This is a sign of increased inflammation in your body. Elevated homocysteine levels. This is an amino acid that is associated with heart and blood vessel disease. Using tobacco or nicotine products. A family history of atherosclerosis. Not exercising enough (sedentary  lifestyle). Being stressed. Drinking too much alcohol or using drugs, such as cocaine or methamphetamine. What are the signs or symptoms? Symptoms of atherosclerosis do not occur until the plaque severely narrows or blocks the artery, which decreases blood flow. Sometimes, atherosclerosis does not cause symptoms. Symptoms of this condition include: Coronary artery disease. This may cause chest pain and shortness of breath. Decreased blood supply to your brain, which may cause a stroke. Signs of a stroke may include sudden: Weakness or numbness in your face, arm, or leg, especially on one side of your body. Trouble walking or difficulty moving your arms or legs. Loss of balance or coordination. Confusion. Slurred speech. Trouble speaking, or trouble understanding speech, or both (aphasia). Vision changes in one or both eyes. This may be double vision, blurred vision, or loss of vision. Severe headache with no known cause. The headache is often described as the worst headache ever experienced. PAD, which may cause pain, numbness, or nonhealing wounds, often in your legs and hips. Renal failure. This may cause tiredness, problems with urination, swelling, and itchy skin. How is this diagnosed? This condition is diagnosed based on your medical history and a physical exam. During the exam, your health care provider will: Check your pulse in different places. Listen for a "whooshing" sound over your arteries (bruit). You may also have tests, such as: Blood tests to check your levels of cholesterol, triglycerides, blood sugar, and CRP. Ankle-brachial index to compare blood pressure in your arms to blood pressure in your ankles to see how your blood is flowing. Heart (cardiac) tests. Electrocardiogram (ECG) to check for heart damage. Stress test to see how your heart reacts to exercise. Ultrasound tests. Ultrasound of your peripheral arteries to check blood flow. Echocardiogram to get images of  your heart's chambers  and valves. X-ray tests. Chest X-ray to see if you have an enlarged heart, which is a sign of heart failure. CT scan to check for damage to your heart, brain, or arteries. Angiogram. This is a test where dye is injected and X-rays are used to see the blood flow in the arteries. How is this treated? This condition is treated with lifestyle changes as the first step. These may include: Changing your diet. Losing weight. Reducing stress. Exercising and being physically active more regularly. Quitting smoking. You may also need medicine to: Lower triglycerides and cholesterol. Control blood pressure. Prevent blood clots. Lower inflammation in your body. Control your blood sugar. Sometimes, surgery is needed to: Remove plaque from an artery (endarterectomy). Open or widen a narrowed heart artery or peripheral artery (angioplasty). Create a new path for your blood with one of these procedures: Heart (coronary) artery bypass graft surgery. Peripheral artery bypass graft surgery. Place a small mesh tube (stent) in an artery to open or widen a narrowed artery. Follow these instructions at home: Eating and drinking  Eat a heart-healthy diet. Talk with your health care provider or a dietitian if you need help. A heart-healthy diet involves: Limiting unhealthy fats and increasing healthy fats. Some examples of healthy fats are avocados and olive oil. Eating plant-based foods, such as fruits, vegetables, nuts, whole grains, and legumes (such as peas and lentils). If you drink alcohol: Limit how much you have to: 0-1 drink a day for women who are not pregnant. 0-2 drinks a day for men. Know how much alcohol is in a drink. In the [place] one drink equals one 12 oz bottle of beer (355 mL), one 5 oz glass of wine (148 mL), or one 1 oz glass of hard liquor (44 mL). Lifestyle  Maintain a healthy weight. Lose weight if your health care provider says that you need to do  that. Follow an exercise program as told by your health care provider. Do not use any products that contain nicotine or tobacco. These products include cigarettes, chewing tobacco, and vaping devices, such as e-cigarettes. If you need help quitting, ask your health care provider. Do not use drugs. General instructions Take over-the-counter and prescription medicines only as told by your health care provider. Manage other health conditions as told. Keep all follow-up visits. This is important. Contact a health care provider if you have: An irregular heartbeat. Unexplained tiredness (fatigue). Trouble urinating, or you are producing less urine or foamy urine. Swelling of your hands or feet, or itchy skin. Unexplained pain or numbness in your legs or hips. A wound that is slow to heal or is not healing. Get help right away if: You have any symptoms of a heart attack. These may be: Chest pain. This includes squeezing chest pain that may feel like indigestion (angina). Shortness of breath. Pain in your neck, jaw, arms, back, or stomach. Cold sweat. Nausea. Light-headedness. Sudden pain, numbness, or coldness in a limb. You have any symptoms of a stroke. "BE FAST" is an easy way to remember the main warning signs of a stroke: B - Balance. Signs are dizziness, sudden trouble walking, or loss of balance. E - Eyes. Signs are trouble seeing or a sudden change in vision. F - Face. Signs are sudden weakness or numbness of the face, or the face or eyelid drooping on one side. A - Arms. Signs are weakness or numbness in an arm. This happens suddenly and usually on one side of the body. S -  Speech. Signs are sudden trouble speaking, slurred speech, or trouble understanding what people say. T - Time. Time to call emergency services. Write down what time symptoms started. You have other signs of a stroke, such as: A sudden, severe headache with no known cause. Nausea or vomiting. Seizure. These  symptoms may represent a serious problem that is an emergency. Do not wait to see if the symptoms will go away. Get medical help right away. Call your local emergency services (911 in the [place] Do not drive yourself to the hospital. Summary Atherosclerosis is when plaque builds up in the arteries and causes narrowing and hardening of the arteries. Plaque occurs due to inflammation or from a buildup of fat, cholesterol, calcium, cellular waste products, and fibrin. This condition may not cause any symptoms. Symptoms of atherosclerosis do not occur until the plaque severely narrows or blocks the artery. Treatment starts with lifestyle changes and may include medicines. In some cases, surgery is needed. Get help right away if you have any symptoms of a heart attack or stroke. This information is not intended to replace advice given to you by your health care provider. Make sure you discuss any questions you have with your health care provider. Document Revised: 07/18/2020 Document Reviewed: 07/18/2020 Elsevier Patient Education  [employer].

## 2021-02-05 NOTE — Progress Notes (Signed)
Subjective:  Patient ID: Christina Burke, female    DOB: 1960/05/19  Age: 60 y.o. MRN: [id number]  CC: Hypertension   HPI Christina Burke is a 60 y.o. 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  Interval History: Her last labs had revealed elevated total cholesterol of 228, elevated LDL of 150 and a 10 year ASCVD risk of 4.3%.  She had endorsed being on the keto diet to help with weight loss.  Atorvastatin was prescribed however she wanted to work on lifestyle. She has stopped the Keto diet after she received news of her cholesterol.  Complains of insomnia and has to take an additional dose of Klonopin to fall asleep.  She receives Klonopin from her psychiatrist for anxiety. Compliant with her antihypertensive. She does have chronic low back pain and is followed by rehab medicine but states she does not necessarily take the prescriptions prescribed for her.  She has a massage chair at home and has begun and exercise regimen of aquatic therapy, Silver sneakers. Past Medical History:  Diagnosis Date   Anxiety    Cancer ([place])    pt. reports had uterine cancer x 10 years ago   Chest pain 12/08/2013   GERD (gastroesophageal reflux disease) 08/10/2014   Hyperlipidemia    Hypertension    Murmur 12/08/2013    Past Surgical History:  Procedure Laterality Date   ABDOMINAL HYSTERECTOMY     ABDOMINAL SURGERY     COLONOSCOPY     UPPER GASTROINTESTINAL ENDOSCOPY  4/21/216    Family History  Problem Relation Age of Onset   Gallbladder disease Mother    Heart disease Father    Lung cancer Father    Brain cancer Father    Colon polyps Sister    Diabetes Sister        x3   Diabetes Brother    Breast cancer Paternal Aunt    Colon cancer Neg Hx    Kidney disease Neg Hx    Esophageal cancer Neg  Hx    Inflammatory bowel disease Neg Hx    Liver disease Neg Hx    Pancreatic cancer Neg Hx    Rectal cancer Neg Hx    Stomach cancer Neg Hx     Allergies  Allergen Reactions   Amoxicillin Rash    Has patient had a PCN reaction causing immediate rash, facial/tongue/throat swelling, SOB or lightheadedness with hypotension: yes Has patient had a PCN reaction causing severe rash involving mucus membranes or skin necrosis: no Has patient had a PCN reaction that required hospitalization: no Has patient had a PCN reaction occurring within the last 10 years: no If all of the above answers are "NO", then may proceed with Cephalosporin use.    Penicillins Rash    Outpatient Medications Prior to Visit  Medication Sig Dispense Refill   clonazePAM (KLONOPIN) 1 MG tablet Take 1 mg by mouth 2 (two) times daily.     cyclobenzaprine (FLEXERIL) 5 MG tablet Take 1 tablet (5 mg total) by mouth 3 (three) times daily as needed for muscle spasms. 30 tablet 3   diclofenac Sodium (VOLTAREN) 1 % GEL Apply 2 g topically 4 (four) times daily. 100 g 3   DULoxetine (CYMBALTA) 60 MG capsule Take 1 capsule (60 mg total) by mouth 2 (two) times daily. For back pain 60 capsule 3   esomeprazole (  NEXIUM) 20 MG capsule TAKE 1 CAPSULE BY MOUTH DAILY BEFORE BREAKFAST. 30 capsule 2   esomeprazole (NEXIUM) 20 MG packet Take 20 mg by mouth daily before breakfast. 30 each 2   fluticasone (FLONASE) 50 MCG/ACT nasal spray Place 2 sprays into both nostrils daily. 16 g 6   gabapentin (NEURONTIN) 300 MG capsule Take 1 capsule (300 mg total) by mouth 3 (three) times daily. 90 capsule 0   lidocaine (LIDODERM) 5 % Place 1 patch onto the skin daily. Remove & Discard patch within 12 hours or as directed by MD 30 patch 3   meclizine (ANTIVERT) 25 MG tablet TAKE 1 TABLET (25 MG TOTAL) BY MOUTH 3 (THREE) TIMES DAILY AS NEEDED FOR DIZZINESS. 30 tablet 0   methocarbamol (ROBAXIN) 500 MG tablet Take 1 tablet (500 mg total) by mouth every 8  (eight) hours as needed for muscle spasms. 60 tablet 6   ondansetron (ZOFRAN ODT) 4 MG disintegrating tablet Take 1 tablet (4 mg total) by mouth every 8 (eight) hours as needed for nausea or vomiting. 20 tablet 0   tizanidine (ZANAFLEX) 2 MG capsule Take 1 capsule (2 mg total) by mouth at bedtime as needed for muscle spasms. 30 capsule 0   Vitamin D, Ergocalciferol, (DRISDOL) 1.25 MG (50000 UNIT) CAPS capsule Take 1 capsule (50,000 Units total) by mouth every 7 (seven) days. 16 capsule 0   zinc gluconate 50 MG tablet Take 50 mg by mouth daily.     atorvastatin (LIPITOR) 20 MG tablet Take 1 tablet (20 mg total) by mouth daily. 30 tablet 3   benazepril (LOTENSIN) 20 MG tablet TAKE 1 TABLET (20 MG TOTAL) BY MOUTH DAILY. 30 tablet 1   Facility-Administered Medications Prior to Visit  Medication Dose Route Frequency Provider Last Rate Last Admin   0.9 %  sodium chloride infusion  500 mL Intravenous Once Mansouraty, Telford Nab., MD         ROS Review of Systems  Constitutional:  Negative for activity change, appetite change and fatigue.  HENT:  Negative for congestion, sinus pressure and sore throat.   Eyes:  Negative for visual disturbance.  Respiratory:  Negative for cough, chest tightness, shortness of breath and wheezing.   Cardiovascular:  Negative for chest pain and palpitations.  Gastrointestinal:  Negative for abdominal distention, abdominal pain and constipation.  Endocrine: Negative for polydipsia.  Genitourinary:  Negative for dysuria and frequency.  Musculoskeletal:  Positive for back pain. Negative for arthralgias.  Skin:  Negative for rash.  Neurological:  Negative for tremors, light-headedness and numbness.  Hematological:  Does not bruise/bleed easily.  Psychiatric/Behavioral:  Positive for sleep disturbance. Negative for agitation and behavioral problems.    Objective:  BP 113/74   Pulse 90   Ht 5' 3.5" (1.613 m)   Wt 215 lb (97.5 kg)   SpO2 98%   BMI 37.49 kg/m    BP/Weight 02/05/2021 1/61/0960 07/31/4096  Systolic BP 119 147 829  Diastolic BP 74 82 88  Wt. (Lbs) 215 217.4 227  BMI 37.49 37.91 39.58      Physical Exam Constitutional:      Appearance: She is well-developed.  Cardiovascular:     Rate and Rhythm: Normal rate.     Heart sounds: Normal heart sounds. No murmur heard. Pulmonary:     Effort: Pulmonary effort is normal.     Breath sounds: Normal breath sounds. No wheezing or rales.  Chest:     Chest wall: No tenderness.  Abdominal:  General: Bowel sounds are normal. There is no distension.     Palpations: Abdomen is soft. There is no mass.     Tenderness: There is no abdominal tenderness.  Musculoskeletal:        General: Tenderness (slight TTP on palpation of R trapezius muscle) present. Normal range of motion.     Right lower leg: No edema.     Left lower leg: No edema.  Neurological:     Mental Status: She is alert and oriented to person, place, and time.  Psychiatric:        Mood and Affect: Mood normal.    CMP Latest Ref Rng & Units 01/16/2021 11/13/2020 03/20/2020  Glucose 65 - 99 mg/dL 104(H) 112(H) -  BUN 8 - 27 mg/dL 13 13 -  Creatinine 0.57 - 1.00 mg/dL 0.90 0.80 0.90  Sodium 134 - 144 mmol/L 141 140 -  Potassium 3.5 - 5.2 mmol/L 4.3 4.4 -  Chloride 96 - 106 mmol/L 101 103 -  CO2 20 - 29 mmol/L 23 22 -  Calcium 8.7 - 10.3 mg/dL 9.9 9.5 -  Total Protein 6.0 - 8.5 g/dL 7.6 7.4 -  Total Bilirubin 0.0 - 1.2 mg/dL 0.4 0.3 -  Alkaline Phos 44 - 121 IU/L 80 83 -  AST 0 - 40 IU/L 24 18 -  ALT 0 - 32 IU/L 27 19 -    Lipid Panel     Component Value Date/Time   CHOL 228 (H) 01/16/2021 0943   TRIG 174 (H) 01/16/2021 0943   HDL 46 01/16/2021 0943   CHOLHDL 5.0 (H) 01/16/2021 0943   CHOLHDL 3.9 04/08/2016 1025   VLDL 12 04/08/2016 1025   LDLCALC 150 (H) 01/16/2021 0943    CBC    Component Value Date/Time   WBC 6.0 12/13/2019 1703   WBC 11.5 (H) 04/20/2019 1123   RBC 4.47 12/13/2019 1703   RBC 4.61  04/20/2019 1123   HGB 13.7 12/13/2019 1703   HCT 41.5 12/13/2019 1703   PLT 251 12/13/2019 1703   MCV 93 12/13/2019 1703   MCH 30.6 12/13/2019 1703   MCH 30.4 04/20/2019 1123   MCHC 33.0 12/13/2019 1703   MCHC 33.2 04/20/2019 1123   RDW 12.4 12/13/2019 1703   LYMPHSABS 1.4 12/13/2019 1703   MONOABS 0.3 03/01/2015 1600   EOSABS 0.1 12/13/2019 1703   BASOSABS 0.1 12/13/2019 1703    Lab Results  Component Value Date   HGBA1C 6.0 (H) 11/13/2020    The 10-year ASCVD risk score (Arnett DK, et al., 2019) is: 4.3%   Values used to calculate the score:     Age: 61 years     Sex: Female     Is Non-Hispanic African American: No     Diabetic: No     Tobacco smoker: No     Systolic Blood Pressure: 923 mmHg     Is BP treated: Yes     HDL Cholesterol: 46 mg/dL     Total Cholesterol: 228 mg/dL  Assessment & Plan:  1. Essential hypertension Controlled Counseled on blood pressure goal of less than 130/80, low-sodium, DASH diet, medication compliance, 150 minutes of moderate intensity exercise per week. Discussed medication compliance, adverse effects. - benazepril (LOTENSIN) 20 MG tablet; TAKE 1 TABLET (20 MG TOTAL) BY MOUTH DAILY.  Dispense: 90 tablet; Refill: 1  2. Pure hypercholesterolemia Uncontrolled She attributes poor control to a keto diet Currently off keto diet and is working on a low-cholesterol diet 10-year ASCVD risk score  is 4.3%   3. Atherosclerotic cardiovascular disease Noticed in [facility] on MRA from 03/2020 Advised that it would be beneficial to remain on statin however she would like to repeat her lipid panel again  4. Other insomnia Counseled on sleep hygiene Advised about tolerance and dependence associated with chronic pain which she receives from her psychiatrist   Meds ordered this encounter  Medications   benazepril (LOTENSIN) 20 MG tablet    Sig: TAKE 1 TABLET (20 MG TOTAL) BY MOUTH DAILY.    Dispense:  90 tablet    Refill:  1    Please dispense #90     Follow-up: Return in about 3 months (around 05/08/2021) for Hyperlipidemia.       Charlott Rakes, MD, FAAFP. [facility] [place], [place]   02/05/2021, 2:59 PM

## 2021-02-27 ENCOUNTER — Other Ambulatory Visit: Payer: Self-pay

## 2021-02-28 ENCOUNTER — Other Ambulatory Visit: Payer: Self-pay

## 2021-03-06 ENCOUNTER — Other Ambulatory Visit: Payer: Self-pay

## 2021-04-10 ENCOUNTER — Emergency Department (INDEPENDENT_AMBULATORY_CARE_PROVIDER_SITE_OTHER)
Admission: EM | Admit: 2021-04-10 | Discharge: 2021-04-10 | Disposition: A | Discharge status: Home / Self Care | Payer: Self-pay | Source: Home / Self Care | Attending: Family Medicine | Admitting: Family Medicine

## 2021-04-10 ENCOUNTER — Other Ambulatory Visit: Payer: Self-pay

## 2021-04-10 DIAGNOSIS — J069 Acute upper respiratory infection, unspecified: Secondary | ICD-10-CM

## 2021-04-10 MED ORDER — PROMETHAZINE-DM 6.25-15 MG/5ML PO SYRP
5.0000 mL | ORAL_SOLUTION | Freq: Four times a day (QID) | ORAL | 0 refills | Status: DC | PRN
  Filled 2021-04-10: qty 180, 9d supply, fill #0

## 2021-04-10 MED ORDER — AZITHROMYCIN 250 MG PO TABS
ORAL_TABLET | ORAL | 0 refills | Status: DC
  Filled 2021-04-10: qty 6, 5d supply, fill #0

## 2021-04-10 MED ORDER — METHYLPREDNISOLONE ACETATE 80 MG/ML IJ SUSP
80.0000 mg | Freq: Once | INTRAMUSCULAR | Status: AC
  Administered 2021-04-10: 13:00:00 80 mg via INTRAMUSCULAR

## 2021-04-10 NOTE — Discharge Instructions (Signed)
Drink plenty of fluids Take the antibiotic as directed.  2 pills today then 1 a day until gone Take cough medicine as needed See your primary care doctor if not improving by next week

## 2021-04-10 NOTE — ED Provider Notes (Signed)
[facility]    CSN: [id number] Arrival date & time: 04/10/21  1059      History   Chief Complaint Chief Complaint  Patient presents with   Sore Throat   Cough   Nasal Congestion    HPI Christina Burke is a 60 y.o. female.   HPI  Patient has had cold and congestion and sore throat.  This is been going on for 8 days.  She states her COVID and flu test were both negative.  She also has had some nausea and loose stools.  She is taking over-the-counter cough and cold medicines.  She states that she has purulent sputum.  Patient states that after eating she is feeling worse instead of better.  Past Medical History:  Diagnosis Date   Anxiety    Cancer ([place])    pt. 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   Calculus of gallbladder without cholecystitis without obstruction 07/03/2020   Elevated lipase 07/03/2020   Intermittent diarrhea 07/03/2020   Dysphagia 02/19/2020   Schatzki's ring 02/19/2020   Right flank pain 02/19/2020   Constipation 02/19/2020   Family history of colonic polyps 02/19/2020   Spinal stenosis of cervical region 10/18/2019   Other spondylosis with radiculopathy, cervical region 08/09/2019   Vitamin D deficiency 03/23/2018   Hypertension    Anxiety    Hyperlipidemia    GERD (gastroesophageal reflux disease) 08/10/2014   HTN (hypertension) 01/25/2014   Chest pain 12/08/2013   Murmur 12/08/2013    Past Surgical History:  Procedure Laterality Date   ABDOMINAL HYSTERECTOMY     ABDOMINAL SURGERY     COLONOSCOPY     UPPER GASTROINTESTINAL ENDOSCOPY  4/21/216    OB History     Gravida  3   Para      Term      Preterm      AB  1   Living  2      SAB  1   IAB      Ectopic       Multiple      Live Births  2            Home Medications    Prior to Admission medications   Medication Sig Start Date End Date Taking? Authorizing Provider  azithromycin (ZITHROMAX Z-PAK) 250 MG tablet Take two tablets today followed by one a day until gone 04/10/21  Yes Raylene Everts, MD  promethazine-dextromethorphan (PROMETHAZINE-DM) 6.25-15 MG/5ML syrup Take 5 mLs by mouth 4 (four) times daily as needed for cough. 04/10/21  Yes Raylene Everts, MD  benazepril (LOTENSIN) 20 MG tablet TAKE 1 TABLET (20 MG TOTAL) BY MOUTH DAILY. 02/05/21 02/05/22  Charlott Rakes, MD  clonazePAM (KLONOPIN) 1 MG tablet Take 1 mg by mouth 2 (two) times daily.    [provider]  DULoxetine (CYMBALTA) 60 MG capsule Take 1 capsule (60 mg total) by mouth 2 (two) times daily. For back pain 06/28/19   Raulkar, Clide Deutscher, MD  esomeprazole (NEXIUM) 20 MG capsule TAKE 1 CAPSULE BY MOUTH DAILY BEFORE BREAKFAST. 06/27/20 06/27/21  Mansouraty, Telford Nab., MD  lidocaine (LIDODERM) 5 % Place 1 patch onto the skin daily. Remove & Discard patch within 12 hours or as  directed by MD 12/13/19   Charlott Rakes, MD  methocarbamol (ROBAXIN) 500 MG tablet Take 1 tablet (500 mg total) by mouth every 8 (eight) hours as needed for muscle spasms. 05/04/19   Charlott Rakes, MD    Family History Family History  Problem Relation Age of Onset   Gallbladder disease Mother    Heart disease Father    Lung cancer Father    Brain cancer Father    Colon polyps Sister    Diabetes Sister        x3   Diabetes Brother    Breast cancer Paternal Aunt    Colon cancer Neg Hx    Kidney disease Neg Hx    Esophageal cancer Neg Hx    Inflammatory bowel disease Neg Hx    Liver disease Neg Hx    Pancreatic cancer Neg Hx    Rectal cancer Neg Hx    Stomach cancer Neg Hx     Social History Social History   Tobacco Use   Smoking status: Never   Smokeless tobacco: Never  Vaping Use   Vaping Use: Never used  Substance Use  Topics   Alcohol use: Yes    Alcohol/week: 1.0 - 2.0 standard drink    Types: 1 - 2 Cans of beer per week    Comment: a night   Drug use: No     Allergies   Amoxicillin and Penicillins   Review of Systems Review of Systems  See HPI Physical Exam Triage Vital Signs ED Triage Vitals  Enc Vitals Group     BP 04/10/21 1152 (!) 150/94     Pulse Rate 04/10/21 1152 (!) 103     Resp 04/10/21 1152 16     Temp 04/10/21 1152 98.2 F (36.8 C)     Temp Source 04/10/21 1152 Oral     SpO2 04/10/21 1152 96 %     Weight 04/10/21 1148 220 lb (99.8 kg)     Height 04/10/21 1148 5\' 3"  (1.6 m)     Head Circumference --      Peak Flow --      Pain Score 04/10/21 1148 4     Pain Loc --      Pain Edu? --      Excl. in [place]? --    No data found.  Updated Vital Signs BP (!) 150/94 (BP Location: Right Arm)    Pulse (!) 103    Temp 98.2 F (36.8 C) (Oral)    Resp 16    Ht 5\' 3"  (1.6 m)    Wt 99.8 kg    SpO2 96%    BMI 38.97 kg/m     Physical Exam Constitutional:      General: She is not in acute distress.    Appearance: She is well-developed.  HENT:     Head: Normocephalic and atraumatic.     Right Ear: Tympanic membrane and ear canal normal.     Left Ear: Tympanic membrane and ear canal normal.     Nose: Congestion and rhinorrhea present.     Mouth/Throat:     Pharynx: No posterior oropharyngeal erythema.  Eyes:     Conjunctiva/sclera: Conjunctivae normal.     Pupils: Pupils are equal, round, and reactive to light.  Cardiovascular:     Rate and Rhythm: Normal rate and regular rhythm.     Heart sounds: Normal heart sounds.  Pulmonary:     Effort: Pulmonary effort is normal. No respiratory distress.  Breath sounds: Rhonchi present.     Comments: Few anterior rhonchi Abdominal:     General: There is no distension.     Palpations: Abdomen is soft.  Musculoskeletal:        General: Normal range of motion.     Cervical back: Normal range of motion.  Lymphadenopathy:      Cervical: Cervical adenopathy present.  Skin:    General: Skin is warm and dry.  Neurological:     Mental Status: She is alert.  Psychiatric:        Mood and Affect: Mood normal.        Behavior: Behavior normal.     UC Treatments / Results  Labs (all labs ordered are listed, but only abnormal results are displayed) Labs Reviewed - No data to display  EKG   Radiology No results found.  Procedures Procedures (including critical care time)  Medications Ordered in UC Medications  methylPREDNISolone acetate (DEPO-MEDROL) injection 80 mg (80 mg Intramuscular Given 04/10/21 1304)    Initial Impression / Assessment and Plan / UC Course  I have reviewed the triage vital signs and the nursing notes.  Pertinent labs & imaging results that were available during my care of the patient were reviewed by me and considered in my medical decision making (see chart for details).      Final Clinical Impressions(s) / UC Diagnoses   Final diagnoses:  Upper respiratory tract infection, unspecified type     Discharge Instructions      Drink plenty of fluids Take the antibiotic as directed.  2 pills today then 1 a day until gone Take cough medicine as needed See your primary care doctor if not improving by next week   ED Prescriptions     Medication Sig Dispense Auth. Provider   promethazine-dextromethorphan (PROMETHAZINE-DM) 6.25-15 MG/5ML syrup Take 5 mLs by mouth 4 (four) times daily as needed for cough. 180 mL Raylene Everts, MD   azithromycin (ZITHROMAX Z-PAK) 250 MG tablet Take two tablets today followed by one a day until gone 6 tablet Meda Coffee Jennette Banker, MD      PDMP not reviewed this encounter.   Raylene Everts, MD 04/10/21 [phone]

## 2021-04-10 NOTE — ED Triage Notes (Signed)
Pt requesting steroid shot.

## 2021-04-10 NOTE — ED Triage Notes (Signed)
Pt presents with cough, congestion, and sore throat. Pt st this has been going on for 1 week. Pt was seen and Covid and flu test both neg 1 week ago when symptoms first started. Pt presents with loose stools and nausea. Pt st she has been taking otc cold and flu medications.

## 2021-04-16 NOTE — Progress Notes (Signed)
This encounter was created in error - please disregard.

## 2021-05-22 ENCOUNTER — Other Ambulatory Visit: Payer: Self-pay

## 2021-05-29 ENCOUNTER — Other Ambulatory Visit: Payer: Self-pay

## 2021-05-31 ENCOUNTER — Other Ambulatory Visit: Payer: Self-pay

## 2021-06-03 ENCOUNTER — Other Ambulatory Visit: Payer: Self-pay

## 2021-06-04 ENCOUNTER — Ambulatory Visit: Payer: Self-pay | Admitting: Nurse Practitioner

## 2021-06-05 ENCOUNTER — Ambulatory Visit: Payer: Self-pay | Attending: Family Medicine

## 2021-06-10 ENCOUNTER — Encounter: Payer: Self-pay | Admitting: Family Medicine

## 2021-06-10 ENCOUNTER — Other Ambulatory Visit: Payer: Self-pay | Admitting: Family Medicine

## 2021-06-10 DIAGNOSIS — E7800 Pure hypercholesterolemia, unspecified: Secondary | ICD-10-CM

## 2021-06-10 DIAGNOSIS — R7303 Prediabetes: Secondary | ICD-10-CM

## 2021-06-12 ENCOUNTER — Ambulatory Visit: Payer: Self-pay | Attending: Family Medicine

## 2021-06-12 DIAGNOSIS — R7303 Prediabetes: Secondary | ICD-10-CM

## 2021-06-12 DIAGNOSIS — E7800 Pure hypercholesterolemia, unspecified: Secondary | ICD-10-CM

## 2021-06-13 LAB — CMP14+EGFR
ALT: 14 IU/L (ref 0–32)
AST: 14 IU/L (ref 0–40)
Albumin/Globulin Ratio: 2 (ref 1.2–2.2)
Albumin: 4.7 g/dL (ref 3.8–4.9)
Alkaline Phosphatase: 89 IU/L (ref 44–121)
BUN/Creatinine Ratio: 16 (ref 12–28)
BUN: 13 mg/dL (ref 8–27)
Bilirubin Total: 0.2 mg/dL (ref 0.0–1.2)
CO2: 24 mmol/L (ref 20–29)
Calcium: 9.5 mg/dL (ref 8.7–10.3)
Chloride: 100 mmol/L (ref 96–106)
Creatinine, Ser: 0.81 mg/dL (ref 0.57–1.00)
Globulin, Total: 2.4 g/dL (ref 1.5–4.5)
Glucose: 114 mg/dL — ABNORMAL HIGH (ref 70–99)
Potassium: 4.5 mmol/L (ref 3.5–5.2)
Sodium: 140 mmol/L (ref 134–144)
Total Protein: 7.1 g/dL (ref 6.0–8.5)
eGFR: 83 mL/min/{1.73_m2} (ref >59)

## 2021-06-13 LAB — LP+NON-HDL CHOLESTEROL
Cholesterol, Total: 212 mg/dL — ABNORMAL HIGH (ref 100–199)
HDL: 56 mg/dL (ref >39)
LDL Chol Calc (NIH): 128 mg/dL — ABNORMAL HIGH (ref 0–99)
Total Non-HDL-Chol (LDL+VLDL): 156 mg/dL — ABNORMAL HIGH (ref 0–129)
Triglycerides: 161 mg/dL — ABNORMAL HIGH (ref 0–149)
VLDL Cholesterol Cal: 28 mg/dL (ref 5–40)

## 2021-06-13 LAB — HEMOGLOBIN A1C
Est. average glucose Bld gHb Est-mCnc: 120 mg/dL
Hgb A1c MFr Bld: 5.8 % — ABNORMAL HIGH (ref 4.8–5.6)

## 2021-06-20 ENCOUNTER — Other Ambulatory Visit: Payer: Self-pay

## 2021-06-20 ENCOUNTER — Ambulatory Visit: Payer: Self-pay | Attending: Physician Assistant | Admitting: Physician Assistant

## 2021-06-20 ENCOUNTER — Encounter: Payer: Self-pay | Admitting: Physician Assistant

## 2021-06-20 VITALS — BP 118/83 | HR 69 | Resp 18 | Ht 63.0 in | Wt 225.0 lb

## 2021-06-20 DIAGNOSIS — R140 Abdominal distension (gaseous): Secondary | ICD-10-CM

## 2021-06-20 DIAGNOSIS — E7800 Pure hypercholesterolemia, unspecified: Secondary | ICD-10-CM

## 2021-06-20 DIAGNOSIS — R110 Nausea: Secondary | ICD-10-CM

## 2021-06-20 DIAGNOSIS — R197 Diarrhea, unspecified: Secondary | ICD-10-CM

## 2021-06-20 DIAGNOSIS — Z789 Other specified health status: Secondary | ICD-10-CM

## 2021-06-20 MED ORDER — PROMETHAZINE HCL 12.5 MG PO TABS
ORAL_TABLET | ORAL | 0 refills | Status: DC
  Filled 2021-06-20: qty 20, 2d supply, fill #0

## 2021-06-20 NOTE — Progress Notes (Signed)
Diarrhea, stomach ache, bloating x 17 days

## 2021-06-20 NOTE — Progress Notes (Signed)
Patient ID: Christina Burke, female   DOB: 1961/01/29, 61 y.o.   MRN: [id number]   Christina Burke, is a 61 y.o. female  MBT:[id number]  TXM:[id number]  DOB - [birth year]  Chief Complaint  Patient presents with   Hyperlipidemia   Diarrhea       Subjective:   Christina Burke is a 61 y.o. female here today for ~17 days ago, nausea and a day of diarrhea.  Continued on and off for about 1 week.  Some soft and some loose stools.  No fever.  No fever.  No melena or hematochezia.  Sometimes feels like food is sitting in stomach longer than usual.  +gas/bloating.  No travel. No vomiting.  Appetite is still good.  She is on well water and it smelled odd a couple of months ago but they didn't use it in that time.  No one else has diarrhea and her diarrhea did not start until 6 weeks later.  No unusual or undercooked foods.    Regarding cholesterol-she did NOT do anything to change her diet and says she actually gained weight over the holidays.  She would like the opportunity to work on her diet before resorting to cholesterol medication.     No problems updated.  ALLERGIES: Allergies  Allergen Reactions   Amoxicillin Rash    Has patient had a PCN reaction causing immediate rash, facial/tongue/throat swelling, SOB or lightheadedness with hypotension: yes Has patient had a PCN reaction causing severe rash involving mucus membranes or skin necrosis: no Has patient had a PCN reaction that required hospitalization: no Has patient had a PCN reaction occurring within the last 10 years: no If all of the above answers are "NO", then may proceed with Cephalosporin use.    Penicillins Rash    PAST MEDICAL HISTORY: Past Medical History:  Diagnosis Date   Anxiety    Cancer ([place])    pt. reports had uterine cancer x 10 years ago   Chest pain 12/08/2013   GERD (gastroesophageal reflux disease) 08/10/2014   Hyperlipidemia    Hypertension    Murmur 12/08/2013    MEDICATIONS AT HOME: Prior to  Admission medications   Medication Sig Start Date End Date Taking? Authorizing Provider  clonazePAM (KLONOPIN) 1 MG tablet Take 1 mg by mouth 2 (two) times daily.   Yes [provider]  lidocaine (LIDODERM) 5 % Place 1 patch onto the skin daily. Remove & Discard patch within 12 hours or as directed by MD 12/13/19  Yes Charlott Rakes, MD  promethazine (PHENERGAN) 12.5 MG tablet 1 to 2 tabs daily every 4 to 6 hours if needed for nausea 06/20/21  Yes Freeman Caldron M, PA-C  azithromycin (ZITHROMAX Z-PAK) 250 MG tablet Take two tablets today followed by one a day until gone Patient not taking: Reported on 06/20/2021 04/10/21   Raylene Everts, MD  benazepril (LOTENSIN) 20 MG tablet TAKE 1 TABLET (20 MG TOTAL) BY MOUTH DAILY. Patient not taking: Reported on 06/20/2021 02/05/21 02/05/22  Charlott Rakes, MD  DULoxetine (CYMBALTA) 60 MG capsule Take 1 capsule (60 mg total) by mouth 2 (two) times daily. For back pain Patient not taking: Reported on 06/20/2021 06/28/19   Izora Ribas, MD  esomeprazole (NEXIUM) 20 MG capsule TAKE 1 CAPSULE BY MOUTH DAILY BEFORE BREAKFAST. Patient not taking: Reported on 06/20/2021 06/27/20 06/27/21  Mansouraty, Telford Nab., MD  methocarbamol (ROBAXIN) 500 MG tablet Take 1 tablet (500 mg total) by mouth every 8 (eight) hours as needed for muscle  spasms. Patient not taking: Reported on 06/20/2021 05/04/19   Charlott Rakes, MD  promethazine-dextromethorphan (PROMETHAZINE-DM) 6.25-15 MG/5ML syrup Take 5 mLs by mouth 4 (four) times daily as needed for cough. Patient not taking: Reported on 06/20/2021 04/10/21   Raylene Everts, MD    ROS: Neg HEENT Neg resp Neg cardiac Neg GU Neg MS Neg psych Neg neuro  Objective:   Vitals:   06/20/21 1043  BP: 118/83  Pulse: 69  Resp: 18  SpO2: 95%  Weight: 225 lb (102.1 kg)  Height: 5\' 3"  (1.6 m)   Exam General appearance : Awake, alert, not in Christina distress. Speech Clear. Not toxic looking.  Need redirecting at  times.  Anxious affect HEENT: Atraumatic and Normocephalic  Neck: Supple, no JVD. No cervical lymphadenopathy.  Chest: Good air entry bilaterally, CTAB.  No rales/rhonchi/wheezing CVS: S1 S2 regular, no murmurs.  Abdomen: Bowel sounds present, Non tender and not distended with no gaurding, rigidity or rebound. There is mild TTP in RLQ but neg McBurneys Extremities: B/L Lower Ext shows no edema, both legs are warm to touch Neurology: Awake alert, and oriented X 3, CN II-XII intact, Non focal Skin: No Rash  Data Review Lab Results  Component Value Date   HGBA1C 5.8 (H) 06/12/2021   HGBA1C 6.0 (H) 11/13/2020   HGBA1C 5.6 03/08/2020    Assessment & Plan   1. Nausea - H. pylori breath test - Lipase - Comprehensive metabolic panel - promethazine (PHENERGAN) 12.5 MG tablet; 1 to 2 tabs daily every 4 to 6 hours if needed for nausea  Dispense: 20 tablet; Refill: 0  2. Has well water in home Doubt infectious source given the timing but will assess - Comprehensive metabolic panel - CBC with Differential/Platelet - Cdiff NAA+O+P+Stool Culture  3. Diarrhea, unspecified type No transit meds(she has not been taking Christina) - H. pylori breath test - CBC with Differential/Platelet - Cdiff NAA+O+P+Stool Culture  4. Pure hypercholesterolemia she did NOT do anything to change her diet and says she actually gained weight over the holidays.  She would like the opportunity to work on her diet before resorting to cholesterol medication.  She can address this at next visit with Dr Margarita Rana after lifestyle changes  5. Bloating - H. pylori breath test - Lipase    Patient have been counseled extensively about nutrition and exercise. Other issues discussed during this visit include: low cholesterol diet, weight control and daily exercise, foot care, annual eye examinations at Ophthalmology, importance of adherence with medications and regular follow-up. We also discussed long term complications of  uncontrolled diabetes and hypertension.   Return in about 3 months (around 09/17/2021) for WIth Dr Margarita Rana for cholesterol and chronic conditions.  The patient was given clear instructions to go to ER or return to medical center if symptoms don't improve, worsen or new problems develop. The patient verbalized understanding. The patient was told to call to get lab results if they haven't heard anything in the next week.      Freeman Caldron, PA-C [facility] and [facility] [place], [place]   06/20/2021, 11:13 AM

## 2021-06-20 NOTE — Patient Instructions (Signed)
Diarrhea, Adult °Diarrhea is when you pass loose and watery poop (stool) often. Diarrhea can make you feel weak and cause you to lose water in your body (get dehydrated). Losing water in your body can cause you to: °Feel tired and thirsty. °Have a dry mouth. °Go pee (urinate) less often. °Diarrhea often lasts 2-3 days. However, it can last longer if it is a sign of something more serious. It is important to treat your diarrhea as told by your doctor. °Follow these instructions at home: °Eating and drinking °  °Follow these instructions as told by your doctor: °Take an ORS (oral rehydration solution). This is a drink that helps you replace fluids and minerals your body lost. It is sold at pharmacies and stores. °Drink plenty of fluids, such as: °Water. °Ice chips. °Diluted fruit juice. °Low-calorie sports drinks. °Milk, if you want. °Avoid drinking fluids that have a lot of sugar or caffeine in them. °Eat bland, easy-to-digest foods in small amounts as you are able. These foods include: °Bananas. °Applesauce. °Rice. °Low-fat (lean) meats. °Toast. °Crackers. °Avoid alcohol. °Avoid spicy or fatty foods. ° °Medicines °Take over-the-counter and prescription medicines only as told by your doctor. °If you were prescribed an antibiotic medicine, take it as told by your doctor. Do not stop using the antibiotic even if you start to feel better. °General instructions ° °Wash your hands often using soap and water. If soap and water are not available, use a hand sanitizer. Others in your home should wash their hands as well. Hands should be washed: °After using the toilet or changing a diaper. °Before preparing, cooking, or serving food. °While caring for a sick person. °While visiting someone in a hospital. °Drink enough fluid to keep your pee (urine) pale yellow. °Rest at home while you get better. °Take a warm bath to help with any burning or pain from having diarrhea. °Watch your condition for any changes. °Keep all  follow-up visits as told by your doctor. This is important. °Contact a doctor if: °You have a fever. °Your diarrhea gets worse. °You have new symptoms. °You cannot keep fluids down. °You feel light-headed or dizzy. °You have a headache. °You have muscle cramps. °Get help right away if: °You have chest pain. °You feel very weak or you pass out (faint). °You have bloody or black poop or poop that looks like tar. °You have very bad pain, cramping, or bloating in your belly (abdomen). °You have trouble breathing or you are breathing very quickly. °Your heart is beating very quickly. °Your skin feels cold and clammy. °You feel confused. °You have signs of losing too much water in your body, such as: °Dark pee, very little pee, or no pee. °Cracked lips. °Dry mouth. °Sunken eyes. °Sleepiness. °Weakness. °Summary °Diarrhea is when you pass loose and watery poop (stool) often. °Diarrhea can make you feel weak and cause you to lose water in your body (get dehydrated). °Take an ORS (oral rehydration solution). This is a drink that is sold at pharmacies and stores. °Eat bland, easy-to-digest foods in small amounts as you are able. °Contact a doctor if your condition gets worse. Get help right away if you have signs that you have lost too much water in your body. °This information is not intended to replace advice given to you by your health care provider. Make sure you discuss any questions you have with your health care provider. °Document Revised: 10/24/2020 Document Reviewed: 10/24/2020 °Elsevier Patient Education © 2022 [employer] ° °

## 2021-06-21 ENCOUNTER — Other Ambulatory Visit: Payer: Self-pay

## 2021-06-21 LAB — CBC WITH DIFFERENTIAL/PLATELET
Basophils Absolute: 0.1 10*3/uL (ref 0.0–0.2)
Basos: 1 %
EOS (ABSOLUTE): 0.1 10*3/uL (ref 0.0–0.4)
Eos: 1 %
Hematocrit: 39.5 % (ref 34.0–46.6)
Hemoglobin: 13.2 g/dL (ref 11.1–15.9)
Immature Grans (Abs): 0 10*3/uL (ref 0.0–0.1)
Immature Granulocytes: 0 %
Lymphocytes Absolute: 1.6 10*3/uL (ref 0.7–3.1)
Lymphs: 23 %
MCH: 31.1 pg (ref 26.6–33.0)
MCHC: 33.4 g/dL (ref 31.5–35.7)
MCV: 93 fL (ref 79–97)
Monocytes Absolute: 0.4 10*3/uL (ref 0.1–0.9)
Monocytes: 6 %
Neutrophils Absolute: 4.6 10*3/uL (ref 1.4–7.0)
Neutrophils: 69 %
Platelets: 268 10*3/uL (ref 150–450)
RBC: 4.24 x10E6/uL (ref 3.77–5.28)
RDW: 12.8 % (ref 11.7–15.4)
WBC: 6.8 10*3/uL (ref 3.4–10.8)

## 2021-06-21 LAB — COMPREHENSIVE METABOLIC PANEL
ALT: 19 IU/L (ref 0–32)
AST: 13 IU/L (ref 0–40)
Albumin/Globulin Ratio: 1.8 (ref 1.2–2.2)
Albumin: 4.7 g/dL (ref 3.8–4.9)
Alkaline Phosphatase: 90 IU/L (ref 44–121)
BUN/Creatinine Ratio: 15 (ref 12–28)
BUN: 12 mg/dL (ref 8–27)
Bilirubin Total: 0.3 mg/dL (ref 0.0–1.2)
CO2: 23 mmol/L (ref 20–29)
Calcium: 9.6 mg/dL (ref 8.7–10.3)
Chloride: 104 mmol/L (ref 96–106)
Creatinine, Ser: 0.79 mg/dL (ref 0.57–1.00)
Globulin, Total: 2.6 g/dL (ref 1.5–4.5)
Glucose: 103 mg/dL — ABNORMAL HIGH (ref 70–99)
Potassium: 4.8 mmol/L (ref 3.5–5.2)
Sodium: 140 mmol/L (ref 134–144)
Total Protein: 7.3 g/dL (ref 6.0–8.5)
eGFR: 86 mL/min/{1.73_m2} (ref >59)

## 2021-06-21 LAB — LIPASE: Lipase: 54 U/L (ref 14–72)

## 2021-06-22 LAB — H. PYLORI BREATH TEST: H pylori Breath Test: NEGATIVE

## 2021-06-29 LAB — CDIFF NAA+O+P+STOOL CULTURE
E coli, Shiga toxin Assay: NEGATIVE
Toxigenic C. Difficile by PCR: NEGATIVE

## 2021-08-09 ENCOUNTER — Other Ambulatory Visit: Payer: Self-pay

## 2021-08-09 ENCOUNTER — Emergency Department (INDEPENDENT_AMBULATORY_CARE_PROVIDER_SITE_OTHER)
Admission: EM | Admit: 2021-08-09 | Discharge: 2021-08-09 | Disposition: A | Discharge status: Home / Self Care | Payer: Self-pay | Source: Home / Self Care | Attending: Family Medicine | Admitting: Family Medicine

## 2021-08-09 ENCOUNTER — Encounter: Payer: Self-pay | Admitting: Emergency Medicine

## 2021-08-09 DIAGNOSIS — J069 Acute upper respiratory infection, unspecified: Secondary | ICD-10-CM

## 2021-08-09 MED ORDER — AZITHROMYCIN 250 MG PO TABS
ORAL_TABLET | ORAL | 0 refills | Status: DC
  Filled 2021-08-09: qty 6, 5d supply, fill #0

## 2021-08-09 NOTE — ED Provider Notes (Signed)
?[place] ? ? ? ?CSN: [id number] ?Arrival date & time: 08/09/21  1024 ? ? ?  ? ?History   ?Chief Complaint ?Chief Complaint  ?Patient presents with  ? Cough  ? ? ?HPI ?Christina Burke is a 61 y.o. female.  ? ?Five days ago patient developed sore throat, sneezing, sinus congestion, cough, and fatigue.  She has developed a low grade fever today.  She has had two negative consecutive COVID tests. ? ?The history is provided by the patient.  ? ?Past Medical History:  ?Diagnosis Date  ? Anxiety   ? Cancer [facility])   ? pt. reports had uterine cancer x 10 years ago  ? Chest pain 12/08/2013  ? GERD (gastroesophageal reflux disease) 08/10/2014  ? Hyperlipidemia   ? Hypertension   ? Murmur 12/08/2013  ? ? ?Patient Active Problem List  ? Diagnosis Date Noted  ? 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 Calculus of gallbladder without cholecystitis without obstruction 07/03/2020  ? Elevated lipase 07/03/2020  ? Intermittent diarrhea 07/03/2020  ? Dysphagia 02/19/2020  ? Schatzki's ring 02/19/2020  ? Right flank pain 02/19/2020  ? Constipation 02/19/2020  ? Family history of colonic polyps 02/19/2020  ? Spinal stenosis of cervical region 10/18/2019  ? Other spondylosis with radiculopathy, cervical region 08/09/2019  ? Vitamin D deficiency 03/23/2018  ? Hypertension   ? Anxiety   ? Hyperlipidemia   ? GERD (gastroesophageal reflux disease) 08/10/2014  ? HTN (hypertension) 01/25/2014  ? Chest pain 12/08/2013  ? Murmur 12/08/2013  ? ? ?Past Surgical History:  ?Procedure Laterality Date  ? ABDOMINAL HYSTERECTOMY    ? ABDOMINAL SURGERY    ? COLONOSCOPY    ? UPPER GASTROINTESTINAL ENDOSCOPY  4/21/216  ? ? ?OB History   ? ? Gravida  ?3  ? Para  ?   ? Term  ?   ? Preterm  ?   ? AB  ?1  ? Living  ?2  ?  ? ? SAB  ?1  ? IAB  ?   ? Ectopic  ?   ? Multiple  ?   ? Live Births  ?2  ?   ?  ?  ? ? ? ?Home Medications   ? ?Prior to Admission medications   ?Medication Sig Start Date End Date Taking? Authorizing Provider   ?azithromycin (ZITHROMAX Z-PAK) 250 MG tablet Take two tablets today followed by one a day until gone 08/09/21   Kandra Nicolas, MD  ?benazepril (LOTENSIN) 20 MG tablet TAKE 1 TABLET (20 MG TOTAL) BY MOUTH DAILY. 02/05/21 02/05/22  Charlott Rakes, MD  ?clonazePAM (KLONOPIN) 1 MG tablet Take 1 mg by mouth 2 (two) times daily.    [provider]  ?DULoxetine (CYMBALTA) 60 MG capsule Take 1 capsule (60 mg total) by mouth 2 (two) times daily. For back pain ?Patient not taking: Reported on 06/20/2021 06/28/19   Izora Ribas, MD  ?esomeprazole (NEXIUM) 20 MG capsule TAKE 1 CAPSULE BY MOUTH DAILY BEFORE BREAKFAST. ?Patient not taking: Reported on 06/20/2021 06/27/20 06/27/21  Mansouraty, Telford Nab., MD  ?lidocaine (LIDODERM) 5 % Place 1 patch onto the skin daily. Remove & Discard patch within 12 hours or as directed by MD 12/13/19   Charlott Rakes, MD  ?methocarbamol (ROBAXIN) 500 MG tablet Take 1 tablet (500 mg total) by mouth every 8 (eight) hours as needed for muscle spasms. ?Patient not taking: Reported on 06/20/2021 05/04/19   Charlott Rakes, MD  ?promethazine (PHENERGAN) 12.5 MG tablet Take 1 to  2 tabs daily every 4 to 6 hours if needed for nausea 06/20/21   Argentina Donovan, PA-C  ?promethazine-dextromethorphan (PROMETHAZINE-DM) 6.25-15 MG/5ML syrup Take 5 mLs by mouth 4 (four) times daily as needed for cough. ?Patient not taking: Reported on 06/20/2021 04/10/21   Raylene Everts, MD  ? ? ?Family History ?Family History  ?Problem Relation Age of Onset  ? Gallbladder disease Mother   ? Heart disease Father   ? Lung cancer Father   ? Brain cancer Father   ? Colon polyps Sister   ? Diabetes Sister   ?     x3  ? Diabetes Brother   ? Breast cancer Paternal Aunt   ? Colon cancer Neg Hx   ? Kidney disease Neg Hx   ? Esophageal cancer Neg Hx   ? Inflammatory bowel disease Neg Hx   ? Liver disease Neg Hx   ? Pancreatic cancer Neg Hx   ? Rectal cancer Neg Hx   ? Stomach cancer Neg Hx   ? ? ?Social History ?Social  History  ? ?Tobacco Use  ? Smoking status: Never  ?  Passive exposure: Never  ? Smokeless tobacco: Never  ?Vaping Use  ? Vaping Use: Never used  ?Substance Use Topics  ? Alcohol use: Yes  ?  Alcohol/week: 6.0 standard drinks  ?  Types: 6 Cans of beer per week  ? Drug use: No  ? ? ? ?Allergies   ?Amoxicillin and Penicillins ? ? ?Review of Systems ?Review of Systems ?+ sore throat ?+ cough ?+ sneezing ?No pleuritic pain ?No wheezing ?+ nasal congestion ?+ post-nasal drainage ?No sinus pain/pressure ?No itchy/red eyes ?No earache ?No hemoptysis ?No SOB ?+ fever, + chills ?No nausea ?No vomiting ?No abdominal pain ?No diarrhea ?No urinary symptoms ?No skin rash ?+ fatigue ?No myalgias ?No headache ?Used OTC meds (Zyrtec and Delsym) without relief  ? ?Physical Exam ?Triage Vital Signs ?ED Triage Vitals  ?Enc Vitals Group  ?   BP 08/09/21 1042 118/87  ?   Pulse Rate 08/09/21 1042 93  ?   Resp 08/09/21 1042 17  ?   Temp 08/09/21 1042 99 ?F (37.2 ?C)  ?   Temp Source 08/09/21 1042 Oral  ?   SpO2 08/09/21 1042 96 %  ?   Weight 08/09/21 1045 231 lb (104.8 kg)  ?   Height 08/09/21 1045 '5\' 3"'$  (1.6 m)  ?   Head Circumference --   ?   Peak Flow --   ?   Pain Score 08/09/21 1044 0  ?   Pain Loc --   ?   Pain Edu? --   ?   Excl. in [place]? --   ? ?No data found. ? ?Updated Vital Signs ?BP 118/87 (BP Location: Left Arm)   Pulse 93   Temp 99 ?F (37.2 ?C) (Oral)   Resp 17   Ht '5\' 3"'$  (1.6 m)   Wt 104.8 kg   SpO2 96%   BMI 40.92 kg/m?  ? ?Visual Acuity ?Right Eye Distance:   ?Left Eye Distance:   ?Bilateral Distance:   ? ?Right Eye Near:   ?Left Eye Near:    ?Bilateral Near:    ? ?Physical Exam ?Nursing notes and Vital Signs reviewed. ?Appearance:  Patient appears stated age, and in no acute distress ?Eyes:  Pupils are equal, round, and reactive to light and accomodation.  Extraocular movement is intact.  Conjunctivae are not inflamed  ?Ears:  Canals normal.  Tympanic  membranes normal.  ?Nose:  Mildly congested turbinates.  No sinus  tenderness.  ?Pharynx:  Normal ?Neck:  Supple.  Mildly enlarged left lateral nodes are present. ?Lungs:  Clear to auscultation.  Breath sounds are equal.  Moving air well. ?Heart:  Regular rate and rhythm without murmurs, rubs, or gallops.  ?Abdomen:  Nontender without masses or hepatosplenomegaly.  Bowel sounds are present.  No CVA or flank tenderness.  ?Extremities:  No edema.  ?Skin:  No rash present.  ? ?UC Treatments / Results  ?Labs ?(all labs ordered are listed, but only abnormal results are displayed) ?Labs Reviewed - No data to display ? ?EKG ? ? ?Radiology ?No results found. ? ?Procedures ?Procedures (including critical care time) ? ?Medications Ordered in UC ?Medications - No data to display ? ?Initial Impression / Assessment and Plan / UC Course  ?I have reviewed the triage vital signs and the nursing notes. ? ?Pertinent labs & imaging results that were available during my care of the patient were reviewed by me and considered in my medical decision making (see chart for details). ? ?  ?Because of late onset low grade fever, concern for early developing bacterial bronchitis.  Will begin empiric Z-pack. ?Followup with Family Doctor if not improved in one week.  ? ?Final Clinical Impressions(s) / UC Diagnoses  ? ?Final diagnoses:  ?Viral URI with cough  ? ? ? ?Discharge Instructions   ? ?  ?Take plain guaifenesin ('1200mg'$  extended release tabs such as Mucinex) twice daily, with plenty of water, for cough and congestion. Get adequate rest.   ?May use Afrin nasal spray (or generic oxymetazoline) each morning for about 5 days and then discontinue.  Also recommend using saline nasal spray several times daily and saline nasal irrigation (AYR is a common brand).  Use Flonase nasal spray each morning after using Afrin nasal spray and saline nasal irrigation. ?Try warm salt water gargles for sore throat.  ?Stop all antihistamines (Zyrtec, etc) for now, and other non-prescription cough/cold preparations. ?May take  Delsym Cough Suppressant ("12 Hour Cough Relief") at bedtime for nighttime cough.  ?   ? ? ? ?ED Prescriptions   ? ? Medication Sig Dispense Auth. Provider  ? azithromycin (ZITHROMAX Z-PAK) 250 MG tablet Take two table

## 2021-08-09 NOTE — ED Notes (Signed)
Send out COVID/Flu cancelled - incorrect patient - sample not collected ?

## 2021-08-09 NOTE — ED Triage Notes (Signed)
Cough & congestion  since Sunday  ?Negative home COVID test on day 2 & 3  ?Took zyrtec yesterday -no relief  ?Delsym helped last night  ?Denies chills ? ? ?

## 2021-08-09 NOTE — Discharge Instructions (Signed)
Take plain guaifenesin ('1200mg'$  extended release tabs such as Mucinex) twice daily, with plenty of water, for cough and congestion. Get adequate rest.   ?May use Afrin nasal spray (or generic oxymetazoline) each morning for about 5 days and then discontinue.  Also recommend using saline nasal spray several times daily and saline nasal irrigation (AYR is a common brand).  Use Flonase nasal spray each morning after using Afrin nasal spray and saline nasal irrigation. ?Try warm salt water gargles for sore throat.  ?Stop all antihistamines (Zyrtec, etc) for now, and other non-prescription cough/cold preparations. ?May take Delsym Cough Suppressant ("12 Hour Cough Relief") at bedtime for nighttime cough.  ?   ?

## 2021-08-26 ENCOUNTER — Other Ambulatory Visit: Payer: Self-pay

## 2021-08-26 ENCOUNTER — Other Ambulatory Visit: Payer: Self-pay | Admitting: Family Medicine

## 2021-08-26 DIAGNOSIS — I10 Essential (primary) hypertension: Secondary | ICD-10-CM

## 2021-08-26 MED ORDER — BENAZEPRIL HCL 20 MG PO TABS
ORAL_TABLET | Freq: Every day | ORAL | 0 refills | Status: DC
  Filled 2021-08-26: qty 30, 30d supply, fill #0

## 2021-08-29 ENCOUNTER — Other Ambulatory Visit: Payer: Self-pay

## 2021-09-10 ENCOUNTER — Other Ambulatory Visit: Payer: Self-pay

## 2021-09-10 ENCOUNTER — Other Ambulatory Visit: Payer: Self-pay | Admitting: Family Medicine

## 2021-09-10 DIAGNOSIS — I10 Essential (primary) hypertension: Secondary | ICD-10-CM

## 2021-09-11 ENCOUNTER — Other Ambulatory Visit: Payer: Self-pay

## 2021-09-19 ENCOUNTER — Other Ambulatory Visit: Payer: Self-pay

## 2021-09-19 ENCOUNTER — Emergency Department (HOSPITAL_COMMUNITY): Admission: EM | Admit: 2021-09-19 | Payer: PRIVATE HEALTH INSURANCE | Source: Home / Self Care

## 2021-09-19 ENCOUNTER — Emergency Department (HOSPITAL_COMMUNITY)
Admission: EM | Admit: 2021-09-19 | Discharge: 2021-09-19 | Disposition: A | Discharge status: Home / Self Care | Payer: Self-pay | Attending: Emergency Medicine | Admitting: Emergency Medicine

## 2021-09-19 ENCOUNTER — Emergency Department (HOSPITAL_COMMUNITY): Payer: PRIVATE HEALTH INSURANCE

## 2021-09-19 ENCOUNTER — Encounter (HOSPITAL_COMMUNITY): Payer: Self-pay

## 2021-09-19 DIAGNOSIS — R61 Generalized hyperhidrosis: Secondary | ICD-10-CM | POA: Insufficient documentation

## 2021-09-19 DIAGNOSIS — R011 Cardiac murmur, unspecified: Secondary | ICD-10-CM | POA: Insufficient documentation

## 2021-09-19 DIAGNOSIS — R55 Syncope and collapse: Secondary | ICD-10-CM | POA: Insufficient documentation

## 2021-09-19 DIAGNOSIS — R0989 Other specified symptoms and signs involving the circulatory and respiratory systems: Secondary | ICD-10-CM | POA: Insufficient documentation

## 2021-09-19 DIAGNOSIS — R059 Cough, unspecified: Secondary | ICD-10-CM | POA: Insufficient documentation

## 2021-09-19 DIAGNOSIS — I10 Essential (primary) hypertension: Secondary | ICD-10-CM | POA: Insufficient documentation

## 2021-09-19 DIAGNOSIS — Z79899 Other long term (current) drug therapy: Secondary | ICD-10-CM | POA: Insufficient documentation

## 2021-09-19 DIAGNOSIS — Z20822 Contact with and (suspected) exposure to covid-19: Secondary | ICD-10-CM | POA: Insufficient documentation

## 2021-09-19 DIAGNOSIS — R739 Hyperglycemia, unspecified: Secondary | ICD-10-CM | POA: Insufficient documentation

## 2021-09-19 DIAGNOSIS — R0602 Shortness of breath: Secondary | ICD-10-CM | POA: Insufficient documentation

## 2021-09-19 LAB — BASIC METABOLIC PANEL
Anion gap: 8 (ref 5–15)
BUN: 10 mg/dL (ref 6–20)
CO2: 24 mmol/L (ref 22–32)
Calcium: 9.3 mg/dL (ref 8.9–10.3)
Chloride: 107 mmol/L (ref 98–111)
Creatinine, Ser: 0.97 mg/dL (ref 0.44–1.00)
GFR, Estimated: >60 mL/min (ref >60)
Glucose, Bld: 287 mg/dL — ABNORMAL HIGH (ref 70–99)
Potassium: 4.1 mmol/L (ref 3.5–5.1)
Sodium: 139 mmol/L (ref 135–145)

## 2021-09-19 LAB — CBC
HCT: 40.7 % (ref 36.0–46.0)
Hemoglobin: 13.8 g/dL (ref 12.0–15.0)
MCH: 31.6 pg (ref 26.0–34.0)
MCHC: 33.9 g/dL (ref 30.0–36.0)
MCV: 93.1 fL (ref 80.0–100.0)
Platelets: 292 10*3/uL (ref 150–400)
RBC: 4.37 MIL/uL (ref 3.87–5.11)
RDW: 12.7 % (ref 11.5–15.5)
WBC: 6.6 10*3/uL (ref 4.0–10.5)
nRBC: 0 % (ref 0.0–0.2)

## 2021-09-19 LAB — I-STAT BETA HCG BLOOD, ED (MC, WL, AP ONLY): I-stat hCG, quantitative: <5 m[IU]/mL (ref <5)

## 2021-09-19 LAB — TROPONIN I (HIGH SENSITIVITY)
Troponin I (High Sensitivity): 4 ng/L (ref <18)
Troponin I (High Sensitivity): 5 ng/L (ref <18)

## 2021-09-19 LAB — D-DIMER, QUANTITATIVE: D-Dimer, Quant: <0.27 ug/mL-FEU (ref 0.00–0.50)

## 2021-09-19 LAB — SARS CORONAVIRUS 2 BY RT PCR: SARS Coronavirus 2 by RT PCR: NEGATIVE

## 2021-09-19 IMAGING — CR DG CHEST 2V
2 series · 2 of 2 positions shown · non-contrast
Comparison: [Q1]

CLINICAL DATA: Shortness of breath

EXAM:
CHEST - 2 VIEW

[chest pa]
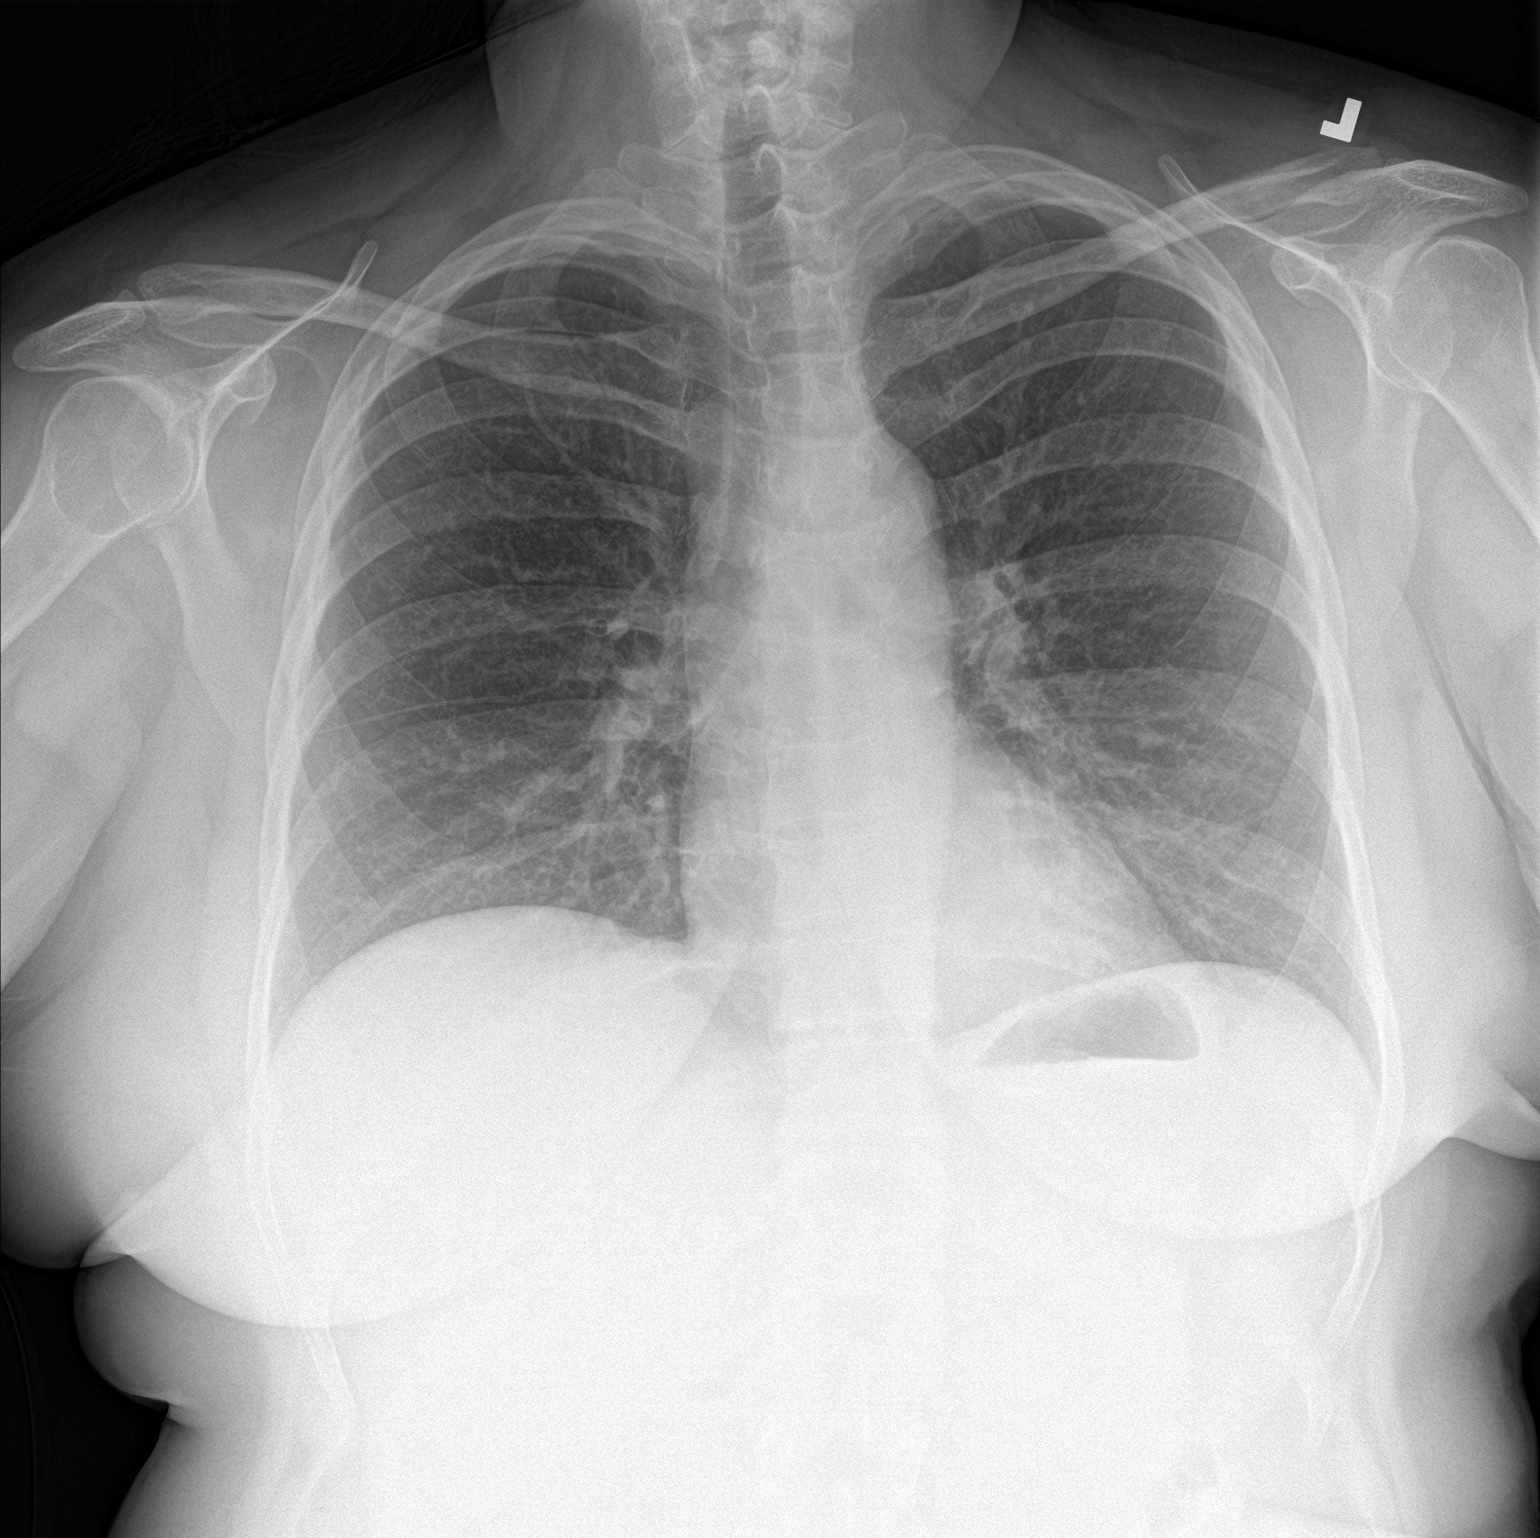

[chest lat]
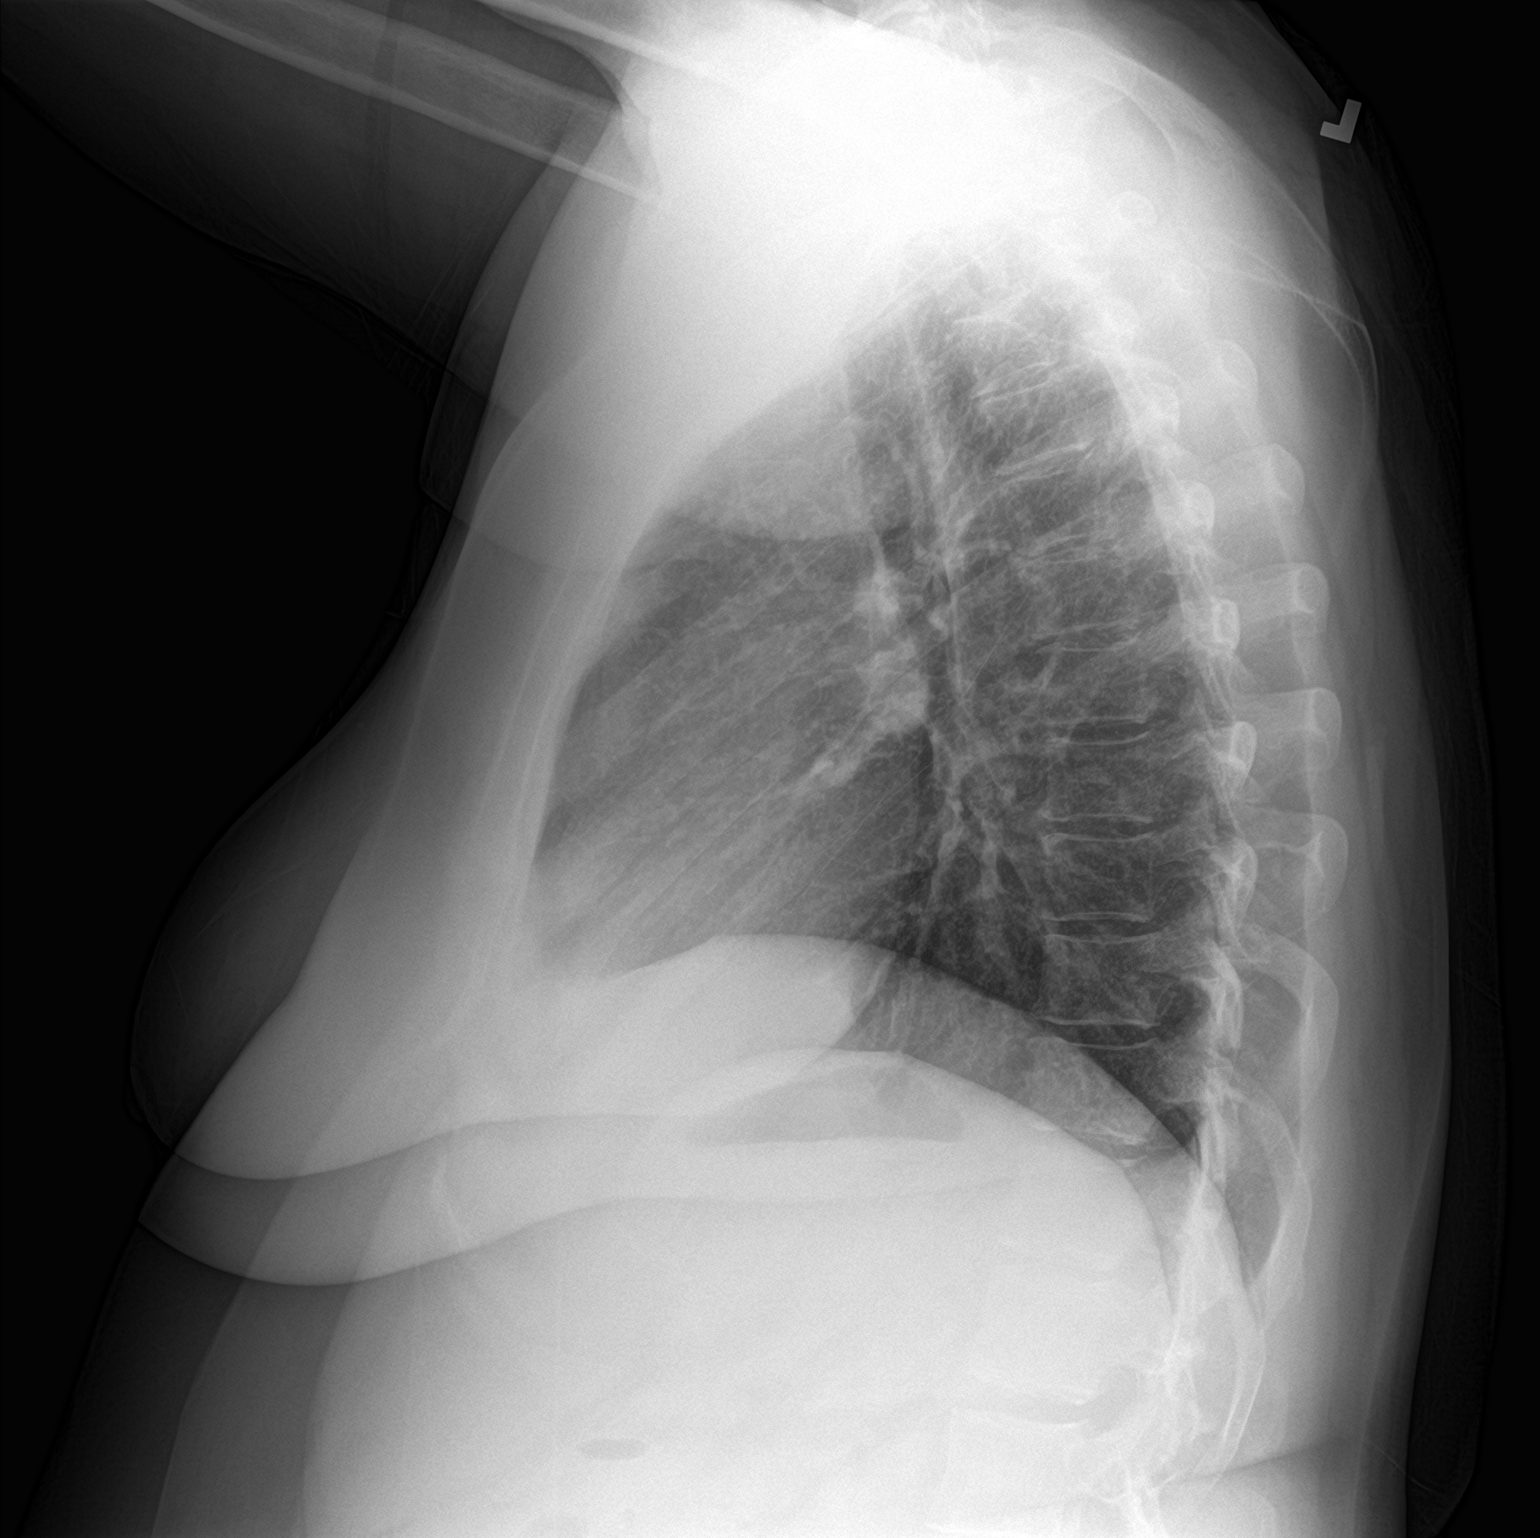

[2 of 2 positions shown; findings below may reference images not displayed]

FINDINGS: The heart size and mediastinal contours are within normal limits.
Both lungs are clear. No pleural effusion or pneumothorax. The
visualized skeletal structures are unremarkable.
IMPRESSION: No active cardiopulmonary disease.

## 2021-09-19 MED ORDER — SODIUM CHLORIDE 0.9 % IV BOLUS
1000.0000 mL | Freq: Once | INTRAVENOUS | Status: AC
  Administered 2021-09-19: 1000 mL via INTRAVENOUS

## 2021-09-19 MED ORDER — METFORMIN HCL 500 MG PO TABS
500.0000 mg | ORAL_TABLET | Freq: Two times a day (BID) | ORAL | 0 refills | Status: DC
  Filled 2021-09-19: qty 28, 14d supply, fill #0

## 2021-09-19 NOTE — ED Provider Triage Note (Signed)
Emergency Medicine Provider Triage Evaluation Note  Christina Burke , a 61 y.o. female  was evaluated in triage.  Pt complains of a near syncope episode that occurred yesterday while walking in [place].  Patient says she has been battling an upper respiratory infection coughing up yellow sputum for the last week.  She states that while she was walking through [place] she got very faint and lightheaded like she was get a pass out.  Patient then sat down drink a bunch of water and then started to feel better.  She denies any other episodes since then.  Patient did have some shortness of breath.  No chest pain, focal weakness, or numbness.   Review of Systems  Positive:  Negative: See above  Physical Exam  BP 131/79 (BP Location: Right Arm)   Pulse (!) 110   Temp 97.6 F (36.4 C)   Resp 16   Ht 5' 3.5" (1.613 m)   Wt 106.6 kg   SpO2 97%   BMI 40.98 kg/m  Gen:   Awake, no distress   Resp:  Normal effort  MSK:   Moves extremities without difficulty  Other:    Medical Decision Making  Medically screening exam initiated at 12:32 PM.  Appropriate orders placed.  Germain Osgood was informed that the remainder of the evaluation will be completed by another provider, this initial triage assessment does not replace that evaluation, and the importance of remaining in the ED until their evaluation is complete.     Myna Bright [place], [place] 09/19/21 1233

## 2021-09-19 NOTE — ED Provider Notes (Signed)
[place] EMERGENCY DEPARTMENT Provider Note   CSN: [id number] Arrival date & time: 09/19/21  1206     History HTN, Murmur, GERD Chief Complaint  Patient presents with   Near Syncope    Christina Burke is a 61 y.o. female.  61 y.o female with PMH of HTN presents to the ED with a chief complaint of syncope.  Patient reports she was at [facility] yesterday, when she ambulating to the exit and noted to have some diaphoresis, began to feel hot all over, felt like she was going to pass out, she was able to hold onto her son who gave her some water and episode subsided.  She reports she did not syncopized, she did not strike the floor.  Is been sick with a viral illness for the past several week, having some cough along with chest congestion.  She has been taking some over-the-counter medication without much improvement in her symptoms.  She does report a prior visit to cardiology due to an ongoing murmur, however has not been diagnosed with CAD.  Has not been any episodes similar as in the past.  Episode was accompanied by shortness of breath.  Endorse alcohol use, drinking 7 beers daily only on the weekends.  Denies any drug use, no weakness, no abdominal pain, no nausea or vomiting.  Denies any sick contacts.  The history is provided by the patient and medical records.  Near Syncope This is a new problem. The current episode started yesterday. The problem occurs rarely. Associated symptoms include shortness of breath. Pertinent negatives include no chest pain, no abdominal pain and no headaches.      Home Medications Prior to Admission medications   Medication Sig Start Date End Date Taking? Authorizing Provider  benazepril (LOTENSIN) 20 MG tablet TAKE 1 TABLET (20 MG TOTAL) BY MOUTH DAILY. 08/26/21 08/26/22  Charlott Rakes, MD  metFORMIN (GLUCOPHAGE) 500 MG tablet Take 1 tablet (500 mg total) by mouth 2 (two) times daily with a meal for 14 days. 09/19/21 10/03/21 Yes Keona Sheffler,  Beverley Fiedler, PA-C  azithromycin (ZITHROMAX Z-PAK) 250 MG tablet Take two tablets by mouth today followed by one tablet daily until gone 08/09/21   Kandra Nicolas, MD  clonazePAM (KLONOPIN) 1 MG tablet Take 1 mg by mouth 2 (two) times daily.    [provider]  DULoxetine (CYMBALTA) 60 MG capsule Take 1 capsule (60 mg total) by mouth 2 (two) times daily. For back pain Patient not taking: Reported on 06/20/2021 06/28/19   Izora Ribas, MD  esomeprazole (NEXIUM) 20 MG capsule TAKE 1 CAPSULE BY MOUTH DAILY BEFORE BREAKFAST. Patient not taking: Reported on 06/20/2021 06/27/20 06/27/21  Mansouraty, Telford Nab., MD  lidocaine (LIDODERM) 5 % Place 1 patch onto the skin daily. Remove & Discard patch within 12 hours or as directed by MD 12/13/19   Charlott Rakes, MD  methocarbamol (ROBAXIN) 500 MG tablet Take 1 tablet (500 mg total) by mouth every 8 (eight) hours as needed for muscle spasms. Patient not taking: Reported on 06/20/2021 05/04/19   Charlott Rakes, MD  promethazine (PHENERGAN) 12.5 MG tablet Take 1 to 2 tabs daily every 4 to 6 hours if needed for nausea 06/20/21   Argentina Donovan, PA-C  promethazine-dextromethorphan (PROMETHAZINE-DM) 6.25-15 MG/5ML syrup Take 5 mLs by mouth 4 (four) times daily as needed for cough. Patient not taking: Reported on 06/20/2021 04/10/21   Raylene Everts, MD      Allergies    Amoxicillin and Penicillins  Review of Systems   Review of Systems  Constitutional:  Positive for diaphoresis.  HENT:  Negative for sore throat.   Respiratory:  Positive for shortness of breath.   Cardiovascular:  Positive for near-syncope. Negative for chest pain.  Gastrointestinal:  Positive for nausea. Negative for abdominal pain and vomiting.  Genitourinary:  Negative for flank pain.  Musculoskeletal:  Negative for back pain.  Neurological:  Positive for syncope. Negative for headaches.  All other systems reviewed and are negative.  Physical Exam Updated Vital Signs BP  (!) 143/71   Pulse 80   Temp 97.6 F (36.4 C)   Resp 13   Ht 5' 3.5" (1.613 m)   Wt 106.6 kg   SpO2 100%   BMI 40.98 kg/m  Physical Exam Vitals and nursing note reviewed.  Constitutional:      General: She is not in acute distress.    Appearance: She is well-developed.  HENT:     Head: Normocephalic and atraumatic.     Mouth/Throat:     Pharynx: No oropharyngeal exudate.  Eyes:     Pupils: Pupils are equal, round, and reactive to light.  Cardiovascular:     Rate and Rhythm: Regular rhythm.     Heart sounds: Murmur heard.  Pulmonary:     Effort: Pulmonary effort is normal. No respiratory distress.     Breath sounds: Normal breath sounds.  Abdominal:     General: Bowel sounds are normal. There is no distension.     Palpations: Abdomen is soft.     Tenderness: There is no abdominal tenderness.  Musculoskeletal:        General: No tenderness or deformity.     Cervical back: Normal range of motion.     Right lower leg: No edema.     Left lower leg: No edema.  Skin:    General: Skin is warm and dry.  Neurological:     Mental Status: She is alert and oriented to person, place, and time.    ED Results / Procedures / Treatments   Labs (all labs ordered are listed, but only abnormal results are displayed) Labs Reviewed  BASIC METABOLIC PANEL - Abnormal; Notable for the following components:      Result Value   Glucose, Bld 287 (*)    All other components within normal limits  SARS CORONAVIRUS 2 BY RT PCR  CBC  D-DIMER, QUANTITATIVE  I-STAT BETA HCG BLOOD, ED ([facility], [facility], [facility] ONLY)  TROPONIN I (HIGH SENSITIVITY)  TROPONIN I (HIGH SENSITIVITY)    EKG None  Radiology DG Chest 2 View  Result Date: 09/19/2021 CLINICAL DATA:  Shortness of breath EXAM: CHEST - 2 VIEW COMPARISON:  2017 FINDINGS: The heart size and mediastinal contours are within normal limits. Both lungs are clear. No pleural effusion or pneumothorax. The visualized skeletal structures are unremarkable.  IMPRESSION: No active cardiopulmonary disease. Electronically Signed   By: Macy Mis M.D.   On: 09/19/2021 13:09    Procedures Procedures   Medications Ordered in ED Medications  sodium chloride 0.9 % bolus 1,000 mL (1,000 mLs Intravenous New Bag/Given 09/19/21 1559)    ED Course/ Medical Decision Making/ A&P                           Medical Decision Making Amount and/or Complexity of Data Reviewed Labs: ordered.  This patient presents to the ED for concern of near syncope, this involves a number of treatment options,  and is a complaint that carries with it a high risk of complications and morbidity.  The differential diagnosis includes ACS,PE, versus infection.    Co morbidities: Discussed in HPI   Brief History:  Patient with with near syncope, no chest pain. Some diaphoresis and sob.   EMR reviewed including pt PMHx, past surgical history and past visits to ER.   See HPI for more details   Lab Tests:  I ordered and independently interpreted labs.  The pertinent results include:    I personally reviewed all laboratory work and imaging. Metabolic panel without any acute abnormality specifically kidney function within normal limits and no significant electrolyte abnormalities. CBC without leukocytosis or significant anemia.  Glucose level on BMP is 287, patient does report eating biscuits and gravy prior to arrival in the ED, was told she was borderline diabetic.  Extensive chart review does reveal a borderline A1c of 5.8-6.0, however is not taking any medication.  D-dimer level was obtained which was negative.  hCG is also negative.   Imaging Studies:  NAD. I personally reviewed all imaging studies and no acute abnormality found. I agree with radiology interpretation.    Cardiac Monitoring:  The patient was maintained on a cardiac monitor.  I personally viewed and interpreted the cardiac monitored which showed an underlying rhythm of: EKG  non-ischemic   Medicines ordered:  I ordered medication including bolus  for symptomatic control Reevaluation of the patient after these medicines showed that the patient stayed the same I have reviewed the patients home medicines and have made adjustments as needed   Reevaluation:  After the interventions noted above I re-evaluated patient and found that they have :improved   Social Determinants of Health:  The patient's social determinants of health were a factor in the care of this patient    Problem List / ED Course:  Patient presents to the ED with a near syncopal episode while at [facility] yesterday, she did not strike the ground.  She did break out into a sweat, had some nausea, felt somewhat lightheaded but symptoms resolved after she drank some water.  Work-up today is negative.  X-ray without any signs of pneumonia, she has had a viral infection for the past 2 weeks.  Electrolyte derangement, does have an elevated glucose until she was likely borderline diabetic in the past.  Prior A1c between 5.8 and 6.0 however is currently on no medication.  No signs of DKA on today's visit.  Denies any urinary symptoms, we were unable to collect a urine on today's visit.  Liver, no anion gap. Considered pulmonary embolism in the setting of patient feeling short of breath when this episode occurred along with some chest pressure, D-dimer level was obtained which was negative. Consider ACS, patient did not complain of any chest pain during this episode, is chest pain-free on today's visit.  Delta troponins were obtained and were negative.  EKG appears nonischemic.  I have a low suspicion for ACS on today's visit. Negative COVID-19 test.  Dispostion:  After consideration of the diagnostic results and the patients response to treatment, I feel that the patent would benefit from starting metformin, however she is adamant that she you might need to follow-up with PCP.  She does see her PCP in  about 1 week.  Patient stable for discharge.    Portions of this note were generated with [employer]. Dictation errors may occur despite best attempts at proofreading.   Final Clinical Impression(s) / ED Diagnoses  Final diagnoses:  Near syncope    Rx / DC Orders ED Discharge Orders          Ordered    metFORMIN (GLUCOPHAGE) 500 MG tablet  2 times daily with meals        09/19/21 1711              Janeece Fitting, PA-C 09/19/21 1711    Margette Fast, MD 09/22/21 1308

## 2021-09-19 NOTE — Discharge Instructions (Addendum)
Your laboratory results was within normal limits today.  The chest x-ray today did not show any active pneumonia.  We have started you today on new medication to help control your blood sugar.  Please take 1 tablet twice a day for the next several days.  You will need to follow-up with your primary care physician in order to place on the appropriate medication for your diabetes.

## 2021-09-19 NOTE — ED Triage Notes (Signed)
Pt arrived POV from home c/o a near syncopal episode yesterday that happened when she was out at the store. Pt has been dealing with a cold for over a month not sure if they are related.

## 2021-09-20 ENCOUNTER — Other Ambulatory Visit: Payer: Self-pay | Admitting: Family Medicine

## 2021-09-20 ENCOUNTER — Ambulatory Visit (HOSPITAL_COMMUNITY)
Admission: EM | Admit: 2021-09-20 | Discharge: 2021-09-20 | Disposition: A | Discharge status: Home / Self Care | Payer: No Typology Code available for payment source | Attending: Emergency Medicine | Admitting: Emergency Medicine

## 2021-09-20 ENCOUNTER — Other Ambulatory Visit: Payer: Self-pay

## 2021-09-20 ENCOUNTER — Ambulatory Visit: Payer: Self-pay

## 2021-09-20 DIAGNOSIS — I10 Essential (primary) hypertension: Secondary | ICD-10-CM

## 2021-09-20 DIAGNOSIS — R051 Acute cough: Secondary | ICD-10-CM | POA: Insufficient documentation

## 2021-09-20 DIAGNOSIS — R7303 Prediabetes: Secondary | ICD-10-CM | POA: Insufficient documentation

## 2021-09-20 LAB — CBG MONITORING, ED: Glucose-Capillary: 118 mg/dL — ABNORMAL HIGH (ref 70–99)

## 2021-09-20 LAB — HEMOGLOBIN A1C
Hgb A1c MFr Bld: 6.2 % — ABNORMAL HIGH (ref 4.8–5.6)
Mean Plasma Glucose: 131.24 mg/dL

## 2021-09-20 MED ORDER — CETIRIZINE HCL 10 MG PO TABS
10.0000 mg | ORAL_TABLET | Freq: Every day | ORAL | 1 refills | Status: DC
  Filled 2021-09-20: qty 30, 30d supply, fill #0

## 2021-09-20 MED ORDER — PROMETHAZINE-DM 6.25-15 MG/5ML PO SYRP
5.0000 mL | ORAL_SOLUTION | Freq: Four times a day (QID) | ORAL | 0 refills | Status: DC | PRN
  Filled 2021-09-20: qty 118, 6d supply, fill #0

## 2021-09-20 MED ORDER — BENAZEPRIL HCL 20 MG PO TABS
ORAL_TABLET | Freq: Every day | ORAL | 0 refills | Status: DC
  Filled 2021-09-20: qty 30, 30d supply, fill #0

## 2021-09-20 MED ORDER — BENZONATATE 100 MG PO CAPS
100.0000 mg | ORAL_CAPSULE | Freq: Three times a day (TID) | ORAL | 0 refills | Status: DC
  Filled 2021-09-20: qty 21, 7d supply, fill #0

## 2021-09-20 NOTE — Discharge Instructions (Addendum)
Your blood sugar today was 118 which is elevated for a fasting sugar which is 70-99   Your last 2 A1c levels of 5.8 and 6.0 are considered to be within the prediabetic range , we had obtained blood work today to check, your primary doctor will take over management regardless of the results  As you have had no history of diagnosed diabetes in the past and has never been on medicine for diabetes, you may attempt lifestyle changes through diet and exercise with reevaluation by your primary doctor at this time  Attempt to maintain a diet of low carbs, substituting your Rahsaan Weak[place] bread, Keishaun Hazel rice, pastas and potatoes for wheat and decreasing your sugar intake  If adding exercise you may start by walking 10 to 15 minutes a few times throughout the week and increase time as you build strength and endurance  Please follow-up with your primary care doctor for reevaluation and management of your symptoms   For your cough -On exam your lungs are clear and you are receiving enough air without assistance -It is possible that your cough is related to irritation through your throat and congestion due to recent colds -Begin use of a daily Zyrtec which will minimize secretions -You may use Tessalon pill every 8 hours as needed for additional comfort -You may use cough syrup every 6 hours for additional comfort, be mindful this medication may make you drowsy, if this occurs use at bedtime

## 2021-09-20 NOTE — Telephone Encounter (Signed)
  Chief Complaint: High Blood sugar Symptoms: Dizziness, thirsty, frequent urination Frequency: Yesterday Pertinent Negatives: Patient denies current  s/s Disposition: '[]'$ ED /'[x]'$ Urgent Care (no appt availability in office) / '[]'$ Appointment(In office/virtual)/ '[]'$  [facility]/ '[]'$ Home Care/ '[]'$ Refused Recommended Disposition /'[]'$ [facility]/ '[]'$  Follow-up with PCP Additional Notes: Pt was seen at ED yesterday for dizziness. BS was found to be 287. PT was discharged and given metformin. Pt will go to UC today for blood sugar test and reevaluation. Pt is unsure if she should start metformin. Pt has in office appt in 7 days. Please return pt call to advise regarding starting metformin and glucose checks.    Summary: blood sugar and blood work   Pt went to ER due to being dizzy/ pt was told her Blood sugar was 286 and that she may be diabetic and the ER gave her Metformin / pt asked if she could come in today to do blood work to check her Blood sugar again / please advise      Reason for Disposition  [1] Symptoms of high blood sugar (e.g., frequent urination, weak, weight loss) AND [2] not able to test blood glucose  Answer Assessment - Initial Assessment Questions 1. BLOOD GLUCOSE: "What is your blood glucose level?"      unknown 2. ONSET: "When did you check the blood glucose?"     Yesterday 3. USUAL RANGE: "What is your glucose level usually?" (e.g., usual fasting morning value, usual evening value)     unknown 4. KETONES: "Do you check for ketones (urine or blood test strips)?" If yes, ask: "What does the test show now?"       5. TYPE 1 or 2:  "Do you know what type of diabetes you have?"  (e.g., Type 1, Type 2, Gestational; doesn't know)       6. INSULIN: "Do you take insulin?" "What type of insulin(s) do you use? What is the mode of delivery? (syringe, pen; injection or pump)?"      no 7. DIABETES PILLS: "Do you take any pills for your diabetes?" If yes, ask: "Have you  missed taking any pills recently?"     Metformin - not yet 8. OTHER SYMPTOMS: "Do you have any symptoms?" (e.g., fever, frequent urination, difficulty breathing, dizziness, weakness, vomiting)     Dizziness, thirsty 9. PREGNANCY: "Is there any chance you are pregnant?" "When was your last menstrual period?"     no  Protocols used: Diabetes - High Blood Sugar-A-AH

## 2021-09-20 NOTE — ED Triage Notes (Signed)
Pt is here to discuss her blood sugar.She was seen at the ED yesterday and diagnosis with Diabetes. She would like fasting lab work here to see if yesterday was a true reading.

## 2021-09-20 NOTE — Telephone Encounter (Signed)
Patient was seen in Ed today, has appointment with PCP on 09/27/2021. Blood sugar will be evaluated then.

## 2021-09-20 NOTE — ED Provider Notes (Signed)
[facility]    CSN: [id number] Arrival date & time: 09/20/21  1000      History   Chief Complaint Chief Complaint  Patient presents with   Blood Sugar Problem    HPI Christina Burke is a 61 y.o. female.   Patient presents requesting blood sugar checked after elevated blood sugar in the emergency department 1 day ago.  Endorses that she was being evaluated for dizzy spells and syncope when her blood sugar was notated to be above 200 and her lab work .  Prescribed metformin by ED physician.  Has not started medication.  Reached out to her PCP to attempt to get reevaluation, unable to be seen today due to physician emergency, has upcoming appointment on September 27, 2021.  Endorses that she has never been diabetic never been on medication.  Does attest to a 20 pound weight gain in the last 4 months.  Has attempted a keto diet in the past which elevated her cholesterol and was advised by her primary physician to stop .  Has attempted a low-fat diet which has not been as successful.  Denies polyuria polydipsia polyphagia.   Patient concerned with a nonproductive cough present for at least 1 month.  Endorses that she was sick with a cold 1 month ago and cough persisted after resolution of accompanying symptoms.  Endorses that she has had a cold over the last week with no known contact in her household.  Recent cold has worsened cough.  Associated wheezing primarily at nighttime and dizziness and partner endorses louder snoring.  Has attempted use of NyQuil cold and flu which was somewhat helpful, unable to tolerate Mucinex.  Denies shortness of breath, chest pain or tightness, chest soreness.    Past Medical History:  Diagnosis Date   Anxiety    Cancer ([place])    pt. 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   Calculus of gallbladder without cholecystitis without obstruction 07/03/2020   Elevated lipase 07/03/2020   Intermittent diarrhea 07/03/2020   Dysphagia 02/19/2020   Schatzki's ring 02/19/2020   Right flank pain 02/19/2020   Constipation 02/19/2020   Family history of colonic polyps 02/19/2020   Spinal stenosis of cervical region 10/18/2019   Other spondylosis with radiculopathy, cervical region 08/09/2019   Vitamin D deficiency 03/23/2018   Hypertension    Anxiety    Hyperlipidemia    GERD (gastroesophageal reflux disease) 08/10/2014   HTN (hypertension) 01/25/2014   Chest pain 12/08/2013   Murmur 12/08/2013    Past Surgical History:  Procedure Laterality Date   ABDOMINAL HYSTERECTOMY     ABDOMINAL SURGERY     COLONOSCOPY     UPPER GASTROINTESTINAL ENDOSCOPY  4/21/216    OB History     Gravida  3   Para      Term      Preterm      AB  1   Living  2      SAB  1   IAB      Ectopic      Multiple      Live Births  2            Home Medications    Prior to Admission medications  Medication Sig Start Date End Date Taking? Authorizing Provider  azithromycin (ZITHROMAX Z-PAK) 250 MG tablet Take two tablets by mouth today followed by one tablet daily until gone 08/09/21   Kandra Nicolas, MD  benazepril (LOTENSIN) 20 MG tablet TAKE 1 TABLET (20 MG TOTAL) BY MOUTH DAILY. 09/20/21 09/20/22  Charlott Rakes, MD  clonazePAM (KLONOPIN) 1 MG tablet Take 1 mg by mouth 2 (two) times daily.    [provider]  DULoxetine (CYMBALTA) 60 MG capsule Take 1 capsule (60 mg total) by mouth 2 (two) times daily. For back pain Patient not taking: Reported on 06/20/2021 06/28/19   Izora Ribas, MD  esomeprazole (NEXIUM) 20 MG capsule TAKE 1 CAPSULE BY MOUTH DAILY BEFORE BREAKFAST. Patient not taking: Reported on 06/20/2021 06/27/20 06/27/21  Mansouraty, Telford Nab., MD  lidocaine (LIDODERM) 5 % Place 1  patch onto the skin daily. Remove & Discard patch within 12 hours or as directed by MD 12/13/19   Charlott Rakes, MD  metFORMIN (GLUCOPHAGE) 500 MG tablet Take 1 tablet (500 mg total) by mouth 2 (two) times daily with a meal for 14 days. 09/19/21 10/04/21  Janeece Fitting, PA-C  methocarbamol (ROBAXIN) 500 MG tablet Take 1 tablet (500 mg total) by mouth every 8 (eight) hours as needed for muscle spasms. Patient not taking: Reported on 06/20/2021 05/04/19   Charlott Rakes, MD  promethazine (PHENERGAN) 12.5 MG tablet Take 1 to 2 tabs daily every 4 to 6 hours if needed for nausea 06/20/21   Argentina Donovan, PA-C  promethazine-dextromethorphan (PROMETHAZINE-DM) 6.25-15 MG/5ML syrup Take 5 mLs by mouth 4 (four) times daily as needed for cough. Patient not taking: Reported on 06/20/2021 04/10/21   Raylene Everts, MD    Family History Family History  Problem Relation Age of Onset   Gallbladder disease Mother    Heart disease Father    Lung cancer Father    Brain cancer Father    Colon polyps Sister    Diabetes Sister        x3   Diabetes Brother    Breast cancer Paternal Aunt    Colon cancer Neg Hx    Kidney disease Neg Hx    Esophageal cancer Neg Hx    Inflammatory bowel disease Neg Hx    Liver disease Neg Hx    Pancreatic cancer Neg Hx    Rectal cancer Neg Hx    Stomach cancer Neg Hx     Social History Social History   Tobacco Use   Smoking status: Never    Passive exposure: Never   Smokeless tobacco: Never  Vaping Use   Vaping Use: Never used  Substance Use Topics   Alcohol use: Yes    Alcohol/week: 6.0 standard drinks    Types: 6 Cans of beer per week   Drug use: No     Allergies   Amoxicillin and Penicillins   Review of Systems Review of Systems  Constitutional: Negative.   Respiratory: Negative.    Cardiovascular: Negative.   Skin: Negative.   Neurological: Negative.     Physical Exam Triage Vital Signs ED Triage Vitals  Enc Vitals Group     BP 09/20/21  1124 135/90     Pulse Rate 09/20/21 1124 84     Resp 09/20/21 1124 16     Temp 09/20/21 1124 (!) 97.5 F (36.4 C)     Temp Source 09/20/21 1124 Oral     SpO2 09/20/21 1124 96 %  Weight --      Height --      Head Circumference --      Peak Flow --      Pain Score 09/20/21 1128 3     Pain Loc --      Pain Edu? --      Excl. in [place]? --    No data found.  Updated Vital Signs BP 135/90 (BP Location: Left Arm)   Pulse 84   Temp (!) 97.5 F (36.4 C) (Oral)   Resp 16   SpO2 96%   Visual Acuity Right Eye Distance:   Left Eye Distance:   Bilateral Distance:    Right Eye Near:   Left Eye Near:    Bilateral Near:     Physical Exam Constitutional:      Appearance: Normal appearance.  HENT:     Head: Normocephalic.  Eyes:     Extraocular Movements: Extraocular movements intact.  Cardiovascular:     Rate and Rhythm: Normal rate and regular rhythm.     Pulses: Normal pulses.     Heart sounds: Normal heart sounds.  Pulmonary:     Effort: Pulmonary effort is normal.     Breath sounds: Normal breath sounds.  Skin:    General: Skin is warm and dry.  Neurological:     Mental Status: She is alert and oriented to person, place, and time. Mental status is at baseline.  Psychiatric:        Mood and Affect: Mood normal.        Behavior: Behavior normal.     UC Treatments / Results  Labs (all labs ordered are listed, but only abnormal results are displayed) Labs Reviewed  CBG MONITORING, ED - Abnormal; Notable for the following components:      Result Value   Glucose-Capillary 118 (*)    All other components within normal limits    EKG   Radiology DG Chest 2 View  Result Date: 09/19/2021 CLINICAL DATA:  Shortness of breath EXAM: CHEST - 2 VIEW COMPARISON:  2017 FINDINGS: The heart size and mediastinal contours are within normal limits. Both lungs are clear. No pleural effusion or pneumothorax. The visualized skeletal structures are unremarkable. IMPRESSION: No  active cardiopulmonary disease. Electronically Signed   By: Macy Mis M.D.   On: 09/19/2021 13:09    Procedures Procedures (including critical care time)  Medications Ordered in UC Medications - No data to display  Initial Impression / Assessment and Plan / UC Course  I have reviewed the triage vital signs and the nursing notes.  Pertinent labs & imaging results that were available during my care of the patient were reviewed by me and considered in my medical decision making (see chart for details).  Diabetes Acute cough  Point-of-care blood sugar 118, patient endorses that she has faxed all morning discussed that this is elevated for a fasting blood sugar, per chart review last A1c level 5.8 and 6.0 with last testing occurring in February 2023, will check A1c today as it has been 3 months-patient endorses a 20 pound weight gain, patient would like to attempt lifestyle changes of diet and exercise before beginning medication, as patient has not had a formal diagnosis of diabetes within the prediabetic range with remembering using medication, okayed for her to attempt lifestyle changes with follow-up with her primary care doctor for further evaluation and management  Vital signs are stable, O2 saturation 96% on room air, lungs are clear to auscultation, patient is  in no signs of distress, etiology of most likely upper airway irritation with no involvement of the lungs, discussed with patient, as she is unable to tolerate Mucinex, recommended taking Zyrtec daily to help minimize secretions and prescribed Tessalon Perles and Promethazine DM for supportive care, if symptoms continue to persist she may follow-up with her PCP for further evaluation Final Clinical Impressions(s) / UC Diagnoses   Final diagnoses:  None     Discharge Instructions      Your blood sugar today was  Your last 2 A1c levels of 5.8 and 6.0 are considered to be within the prediabetic range  As you are concerning  symptoms of increased thirst, frequent urination are consistent with diabetes please begin use of metformin as prescribed by ED physician  This medicine may cause diarrhea, this is a common side effect of the medicine, you may use over-the-counter Imodium for management and continue taking medicine  Please follow-up with your primary care doctor for reevaluation and management of your symptoms   ED Prescriptions   None    PDMP not reviewed this encounter.   Hans Eden, [place] 09/20/21 1219

## 2021-09-25 ENCOUNTER — Ambulatory Visit: Payer: Medicaid Other | Admitting: Family Medicine

## 2021-09-27 ENCOUNTER — Ambulatory Visit: Payer: Self-pay | Attending: Family Medicine | Admitting: Family Medicine

## 2021-09-27 ENCOUNTER — Other Ambulatory Visit: Payer: Self-pay

## 2021-09-27 ENCOUNTER — Encounter: Payer: Self-pay | Admitting: Family Medicine

## 2021-09-27 VITALS — BP 116/79 | HR 73 | Ht 63.0 in | Wt 232.6 lb

## 2021-09-27 DIAGNOSIS — I10 Essential (primary) hypertension: Secondary | ICD-10-CM

## 2021-09-27 DIAGNOSIS — Z6841 Body Mass Index (BMI) 40.0 and over, adult: Secondary | ICD-10-CM

## 2021-09-27 DIAGNOSIS — R7303 Prediabetes: Secondary | ICD-10-CM

## 2021-09-27 DIAGNOSIS — L900 Lichen sclerosus et atrophicus: Secondary | ICD-10-CM

## 2021-09-27 DIAGNOSIS — E119 Type 2 diabetes mellitus without complications: Secondary | ICD-10-CM | POA: Insufficient documentation

## 2021-09-27 MED ORDER — CLOBETASOL PROPIONATE 0.05 % EX CREA
1.0000 | TOPICAL_CREAM | Freq: Two times a day (BID) | CUTANEOUS | 0 refills | Status: DC
Start: 2021-09-27 — End: 2023-06-14
  Filled 2021-09-27: qty 60, 30d supply, fill #0

## 2021-09-27 MED ORDER — TRUEPLUS LANCETS 28G MISC
1.0000 | Freq: Three times a day (TID) | 12 refills | Status: DC
  Filled 2021-09-27: qty 100, 34d supply, fill #0

## 2021-09-27 MED ORDER — TRUE METRIX BLOOD GLUCOSE TEST VI STRP
ORAL_STRIP | 12 refills | Status: DC
  Filled 2021-09-27: qty 100, 25d supply, fill #0

## 2021-09-27 MED ORDER — TRUE METRIX METER W/DEVICE KIT
1.0000 | PACK | Freq: Every day | 0 refills | Status: DC
  Filled 2021-09-27: qty 1, 365d supply, fill #0

## 2021-09-27 MED ORDER — BENAZEPRIL HCL 20 MG PO TABS
ORAL_TABLET | Freq: Every day | ORAL | 1 refills | Status: DC
  Filled 2021-09-27: qty 90, fill #0
  Filled 2021-10-24: qty 90, 90d supply, fill #0
  Filled 2022-01-05: qty 90, 90d supply, fill #1

## 2021-09-27 NOTE — Progress Notes (Signed)
Subjective:  Patient ID: Christina Burke, female    DOB: 21-Aug-1960  Age: 61 y.o. MRN: [id number]  CC: Hospitalization Follow-up   HPI Christina Burke is a 61 y.o. 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  Interval History: She had an ED visit for near syncope last month at which time she was found to have an elevated random glucose of 287 and was commenced on metformin.  At another ED visit A1c returned at 6.2.  She states she lost 10 bs in the last 10 days by cutting out carbs ever since she was informed her glucose was elevated. She never started the Metformin but wants to work on on lifestyle instead. Currently does not exercise she had to stop silver sneakers as it caused her back to hurt  She was diagnosed with Lichen sclerosis in the past by GYN and was placed on a topical treatment.  Currently requesting refill of the cream but is unable to recall the name. She endorses adherence with her antihypertensive.  For her cholesterol she has always wanted to try lifestyle modification and has been wanting to hold off on a statin. Past Medical History:  Diagnosis Date   Anxiety    Cancer ([place])    pt. reports had uterine cancer x 10 years ago   Chest pain 12/08/2013   GERD (gastroesophageal reflux disease) 08/10/2014   Hyperlipidemia    Hypertension    Murmur 12/08/2013    Past Surgical History:  Procedure Laterality Date   ABDOMINAL HYSTERECTOMY     ABDOMINAL SURGERY     COLONOSCOPY     UPPER GASTROINTESTINAL ENDOSCOPY  4/21/216    Family History  Problem Relation Age of Onset   Gallbladder disease Mother    Heart disease Father    Lung cancer Father    Brain cancer Father    Colon polyps Sister    Diabetes Sister        x3   Diabetes Brother    Breast cancer Paternal Aunt     Colon cancer Neg Hx    Kidney disease Neg Hx    Esophageal cancer Neg Hx    Inflammatory bowel disease Neg Hx    Liver disease Neg Hx    Pancreatic cancer Neg Hx    Rectal cancer Neg Hx    Stomach cancer Neg Hx     Social History   Socioeconomic History   Marital status: Single    Spouse name: Not on file   Number of children: 2   Years of education: Not on file   Highest education level: Some college, no degree  Occupational History   Occupation: Unemployed  Tobacco Use   Smoking status: Never    Passive exposure: Never   Smokeless tobacco: Never  Vaping Use   Vaping Use: Never used  Substance and Sexual Activity   Alcohol use: Yes    Alcohol/week: 6.0 standard drinks    Types: 6 Cans of beer per week   Drug use: No   Sexual activity: Yes    Birth control/protection: None  Other Topics Concern   Not on file  Social History Narrative   Right handed   Social Determinants of Health   Financial Resource Strain: Not on file  Food Insecurity: No Food Insecurity   Worried About [place] in the Last Year:  Never true   Ran Out of Food in the Last Year: Never true  Transportation Needs: No Transportation Needs   Lack of Transportation (Medical): No   Lack of Transportation (Non-Medical): No  Physical Activity: Not on file  Stress: Not on file  Social Connections: Not on file    Allergies  Allergen Reactions   Amoxicillin Rash    Has patient had a PCN reaction causing immediate rash, facial/tongue/throat swelling, SOB or lightheadedness with hypotension: yes Has patient had a PCN reaction causing severe rash involving mucus membranes or skin necrosis: no Has patient had a PCN reaction that required hospitalization: no Has patient had a PCN reaction occurring within the last 10 years: no If all of the above answers are "NO", then may proceed with Cephalosporin use.    Penicillins Rash    Outpatient Medications Prior to Visit  Medication Sig Dispense  Refill   clonazePAM (KLONOPIN) 1 MG tablet Take 1 mg by mouth 2 (two) times daily.     lidocaine (LIDODERM) 5 % Place 1 patch onto the skin daily. Remove & Discard patch within 12 hours or as directed by MD 30 patch 3   promethazine (PHENERGAN) 12.5 MG tablet Take 1 to 2 tabs daily every 4 to 6 hours if needed for nausea 20 tablet 0   benazepril (LOTENSIN) 20 MG tablet TAKE 1 TABLET (20 MG TOTAL) BY MOUTH DAILY. 30 tablet 0   metFORMIN (GLUCOPHAGE) 500 MG tablet Take 1 tablet (500 mg total) by mouth 2 (two) times daily with a meal for 14 days. 28 tablet 0   DULoxetine (CYMBALTA) 60 MG capsule Take 1 capsule (60 mg total) by mouth 2 (two) times daily. For back pain (Patient not taking: Reported on 06/20/2021) 60 capsule 3   esomeprazole (NEXIUM) 20 MG capsule TAKE 1 CAPSULE BY MOUTH DAILY BEFORE BREAKFAST. (Patient not taking: Reported on 06/20/2021) 30 capsule 2   methocarbamol (ROBAXIN) 500 MG tablet Take 1 tablet (500 mg total) by mouth every 8 (eight) hours as needed for muscle spasms. (Patient not taking: Reported on 06/20/2021) 60 tablet 6   azithromycin (ZITHROMAX Z-PAK) 250 MG tablet Take two tablets by mouth today followed by one tablet daily until gone (Patient not taking: Reported on 09/27/2021) 6 tablet 0   benzonatate (TESSALON) 100 MG capsule Take 1 capsule (100 mg total) by mouth every 8 (eight) hours. (Patient not taking: Reported on 09/27/2021) 21 capsule 0   cetirizine (ZYRTEC ALLERGY) 10 MG tablet Take 1 tablet (10 mg total) by mouth daily. (Patient not taking: Reported on 09/27/2021) 30 tablet 1   promethazine-dextromethorphan (PROMETHAZINE-DM) 6.25-15 MG/5ML syrup Take 5 mLs by mouth 4 (four) times daily as needed for cough. (Patient not taking: Reported on 09/27/2021) 118 mL 0   No facility-administered medications prior to visit.     ROS Review of Systems  Constitutional:  Negative for activity change, appetite change and fatigue.  HENT:  Negative for congestion, sinus pressure and  sore throat.   Eyes:  Negative for visual disturbance.  Respiratory:  Negative for cough, chest tightness, shortness of breath and wheezing.   Cardiovascular:  Negative for chest pain and palpitations.  Gastrointestinal:  Negative for abdominal distention, abdominal pain and constipation.  Endocrine: Negative for polydipsia.  Genitourinary:  Negative for dysuria and frequency.  Musculoskeletal:  Negative for arthralgias and back pain.  Skin:  Negative for rash.  Neurological:  Negative for tremors, light-headedness and numbness.  Hematological:  Does not bruise/bleed easily.  Psychiatric/Behavioral:  Negative for agitation and behavioral problems.    Objective:  BP 116/79   Pulse 73   Ht 5' 3"  (1.6 m)   Wt 232 lb 9.6 oz (105.5 kg)   SpO2 97%   BMI 41.20 kg/m      09/27/2021   11:13 AM 09/20/2021   11:24 AM 09/19/2021    4:30 PM  BP/Weight  Systolic BP 675 449 201  Diastolic BP 79 90 71  Wt. (Lbs) 232.6    BMI 41.2 kg/m2        Physical Exam Constitutional:      Appearance: She is well-developed. She is obese.  Cardiovascular:     Rate and Rhythm: Normal rate.     Heart sounds: Murmur heard.  Pulmonary:     Effort: Pulmonary effort is normal.     Breath sounds: Normal breath sounds. No wheezing or rales.  Chest:     Chest wall: No tenderness.  Abdominal:     General: Bowel sounds are normal. There is no distension.     Palpations: Abdomen is soft. There is no mass.     Tenderness: There is no abdominal tenderness.  Musculoskeletal:        General: Normal range of motion.     Right lower leg: No edema.     Left lower leg: No edema.  Neurological:     Mental Status: She is alert and oriented to person, place, and time.  Psychiatric:        Mood and Affect: Mood normal.       Latest Ref Rng & Units 09/19/2021   12:40 PM 06/20/2021   11:32 AM 06/12/2021    9:50 AM  CMP  Glucose 70 - 99 mg/dL 287   103   114    BUN 6 - 20 mg/dL 10   12   13     Creatinine 0.44 -  1.00 mg/dL 0.97   0.79   0.81    Sodium 135 - 145 mmol/L 139   140   140    Potassium 3.5 - 5.1 mmol/L 4.1   4.8   4.5    Chloride 98 - 111 mmol/L 107   104   100    CO2 22 - 32 mmol/L 24   23   24     Calcium 8.9 - 10.3 mg/dL 9.3   9.6   9.5    Total Protein 6.0 - 8.5 g/dL  7.3   7.1    Total Bilirubin 0.0 - 1.2 mg/dL  0.3   0.2    Alkaline Phos 44 - 121 IU/L  90   89    AST 0 - 40 IU/L  13   14    ALT 0 - 32 IU/L  19   14      Lipid Panel     Component Value Date/Time   CHOL 212 (H) 06/12/2021 0950   TRIG 161 (H) 06/12/2021 0950   HDL 56 06/12/2021 0950   CHOLHDL 5.0 (H) 01/16/2021 0943   CHOLHDL 3.9 04/08/2016 1025   VLDL 12 04/08/2016 1025   LDLCALC 128 (H) 06/12/2021 0950    CBC    Component Value Date/Time   WBC 6.6 09/19/2021 1240   RBC 4.37 09/19/2021 1240   HGB 13.8 09/19/2021 1240   HGB 13.2 06/20/2021 1132   HCT 40.7 09/19/2021 1240   HCT 39.5 06/20/2021 1132   PLT 292 09/19/2021 1240   PLT 268 06/20/2021 1132   MCV  93.1 09/19/2021 1240   MCV 93 06/20/2021 1132   MCH 31.6 09/19/2021 1240   MCHC 33.9 09/19/2021 1240   RDW 12.7 09/19/2021 1240   RDW 12.8 06/20/2021 1132   LYMPHSABS 1.6 06/20/2021 1132   MONOABS 0.3 03/01/2015 1600   EOSABS 0.1 06/20/2021 1132   BASOSABS 0.1 06/20/2021 1132    Lab Results  Component Value Date   HGBA1C 6.2 (H) 09/20/2021    The 10-year ASCVD risk score (Arnett DK, et al., 2019) is: 3.8%   Values used to calculate the score:     Age: 74 years     Sex: Female     Is Non-Hispanic African American: No     Diabetic: No     Tobacco smoker: No     Systolic Blood Pressure: 151 mmHg     Is BP treated: Yes     HDL Cholesterol: 56 mg/dL     Total Cholesterol: 212 mg/dL  Assessment & Plan:  1. Essential hypertension Controlled Counseled on blood pressure goal of less than 130/80, low-sodium, DASH diet, medication compliance, 150 minutes of moderate intensity exercise per week. Discussed medication compliance, adverse  effects. - CMP14+EGFR - LP+Non-HDL Cholesterol - benazepril (LOTENSIN) 20 MG tablet; TAKE 1 TABLET (20 MG TOTAL) BY MOUTH DAILY.  Dispense: 90 tablet; Refill: 1  2. Prediabetes A1c of 6.2 She is working on lifestyle and would like to hold off on metformin Continue to work on a low carbohydrate diet, increasing exercise up to 150 minutes/day - Blood Glucose Monitoring Suppl (TRUE METRIX METER) DEVI; 1 each by Does not apply route daily before breakfast.  Dispense: 1 each; Refill: 0 - TRUEplus Lancets 28G MISC; 1 each by Does not apply route 3 (three) times daily before meals.  Dispense: 100 each; Refill: 12 - glucose blood (TRUE METRIX BLOOD GLUCOSE TEST) test strip; Use daily  Dispense: 100 each; Refill: 12  3. Morbid obesity ([place]) She will be a good candidate for GLP-1 RA but she would like to work on lifestyle modifications instead  4. Lichen sclerosus - clobetasol cream (TEMOVATE) 0.05 %; Apply 1 application. topically 2 (two) times daily. Apply to the vulva for 4 weeks.  Dispense: 60 g; Refill: 0   Meds ordered this encounter  Medications   benazepril (LOTENSIN) 20 MG tablet    Sig: TAKE 1 TABLET (20 MG TOTAL) BY MOUTH DAILY.    Dispense:  90 tablet    Refill:  1   clobetasol cream (TEMOVATE) 0.05 %    Sig: Apply 1 application. topically 2 (two) times daily. Apply to the vulva for 4 weeks.    Dispense:  60 g    Refill:  0   Blood Glucose Monitoring Suppl (TRUE METRIX METER) DEVI    Sig: 1 each by Does not apply route daily before breakfast.    Dispense:  1 each    Refill:  0   TRUEplus Lancets 28G MISC    Sig: 1 each by Does not apply route 3 (three) times daily before meals.    Dispense:  100 each    Refill:  12   glucose blood (TRUE METRIX BLOOD GLUCOSE TEST) test strip    Sig: Use daily    Dispense:  100 each    Refill:  12    Follow-up: Return in about 3 months (around 12/28/2021) for Chronic medical conditions.       Charlott Rakes, MD, FAAFP. [facility] and [facility] [place], [place]  09/27/2021, 12:30 PM

## 2021-09-27 NOTE — Patient Instructions (Signed)

## 2021-09-27 NOTE — Progress Notes (Signed)
Has had multiple ED visit  in the last month

## 2021-09-28 LAB — CMP14+EGFR
ALT: 32 IU/L (ref 0–32)
AST: 27 IU/L (ref 0–40)
Albumin/Globulin Ratio: 1.7 (ref 1.2–2.2)
Albumin: 4.6 g/dL (ref 3.8–4.9)
Alkaline Phosphatase: 73 IU/L (ref 44–121)
BUN/Creatinine Ratio: 17 (ref 12–28)
BUN: 13 mg/dL (ref 8–27)
Bilirubin Total: 0.3 mg/dL (ref 0.0–1.2)
CO2: 23 mmol/L (ref 20–29)
Calcium: 9.5 mg/dL (ref 8.7–10.3)
Chloride: 101 mmol/L (ref 96–106)
Creatinine, Ser: 0.78 mg/dL (ref 0.57–1.00)
Globulin, Total: 2.7 g/dL (ref 1.5–4.5)
Glucose: 102 mg/dL — ABNORMAL HIGH (ref 70–99)
Potassium: 4.6 mmol/L (ref 3.5–5.2)
Sodium: 138 mmol/L (ref 134–144)
Total Protein: 7.3 g/dL (ref 6.0–8.5)
eGFR: 87 mL/min/{1.73_m2} (ref >59)

## 2021-09-28 LAB — LP+NON-HDL CHOLESTEROL
Cholesterol, Total: 204 mg/dL — ABNORMAL HIGH (ref 100–199)
HDL: 45 mg/dL (ref >39)
LDL Chol Calc (NIH): 131 mg/dL — ABNORMAL HIGH (ref 0–99)
Total Non-HDL-Chol (LDL+VLDL): 159 mg/dL — ABNORMAL HIGH (ref 0–129)
Triglycerides: 157 mg/dL — ABNORMAL HIGH (ref 0–149)
VLDL Cholesterol Cal: 28 mg/dL (ref 5–40)

## 2021-09-30 ENCOUNTER — Other Ambulatory Visit: Payer: Self-pay

## 2021-10-24 ENCOUNTER — Other Ambulatory Visit: Payer: Self-pay

## 2021-10-25 ENCOUNTER — Other Ambulatory Visit: Payer: Self-pay

## 2022-01-06 ENCOUNTER — Other Ambulatory Visit: Payer: Self-pay

## 2022-01-28 ENCOUNTER — Other Ambulatory Visit: Payer: Self-pay

## 2022-01-28 ENCOUNTER — Encounter: Payer: Self-pay | Admitting: Nurse Practitioner

## 2022-01-28 ENCOUNTER — Ambulatory Visit: Payer: Self-pay | Attending: Nurse Practitioner | Admitting: Nurse Practitioner

## 2022-01-28 VITALS — BP 135/85 | HR 83 | Temp 98.0°F | Ht 63.5 in | Wt 236.0 lb

## 2022-01-28 DIAGNOSIS — I10 Essential (primary) hypertension: Secondary | ICD-10-CM

## 2022-01-28 DIAGNOSIS — R7303 Prediabetes: Secondary | ICD-10-CM

## 2022-01-28 DIAGNOSIS — E7800 Pure hypercholesterolemia, unspecified: Secondary | ICD-10-CM

## 2022-01-28 MED ORDER — BENAZEPRIL HCL 20 MG PO TABS
30.0000 mg | ORAL_TABLET | Freq: Every day | ORAL | 1 refills | Status: DC
  Filled 2022-01-28 – 2022-04-10 (×2): qty 135, 90d supply, fill #0

## 2022-01-28 NOTE — Progress Notes (Signed)
Assessment & Plan:  Christina Burke was seen today for headache.  Diagnoses and all orders for this visit:  Essential hypertension Increased lotensin from 20 mg to 30 mg Blood pressure readings higher at home and not at goal today -     benazepril (LOTENSIN) 20 MG tablet; Take 1.5 tablets (30 mg total) by mouth daily. She will monitor blood pressure at home and has been given goal BP parameters  BP Readings from Last 3 Encounters:  01/28/22 135/85  09/27/21 116/79  09/20/21 135/90     Prediabetes -     Hemoglobin A1c  Pure hypercholesterolemia -     Lipid panel    Patient has been counseled on age-appropriate routine health concerns for screening and prevention. These are reviewed and up-to-date. Referrals have been placed accordingly. Immunizations are up-to-date or declined.    Subjective:   Chief Complaint  Patient presents with   Headache    With tension   HPI Christina Burke 61 y.o. female presents to office today for HTN  She has a past medical history of Anxiety, Cancer ([place]), Chest pain (12/08/2013), GERD (gastroesophageal reflux disease) (08/10/2014), Hyperlipidemia, Hypertension, and Murmur (12/08/2013).     She has been experiencing headaches several times per week and has noticed her blood pressure has also been elevated as well as heart rate. She has been taking 40 mg of benazepril  which has lessened the intensity of the headaches and blood pressures have been near normal with increase in ACE. She sometimes takes 40 mg 3 times a week or so. She also takes ibuprofen for her headaches.  If headaches continue she may need to be started on migraine medication.  BP Readings from Last 3 Encounters:  01/28/22 135/85  09/27/21 116/79  09/20/21 135/90     Concerned about weight gain and difficulty losing weight. Recommended pedometer (8000-10000 steps daily)  Well controlled with diet only at this time.  Lab Results  Component Value Date   HGBA1C 6.2 (H) 09/20/2021       Review of Systems  Constitutional:  Negative for fever, malaise/fatigue and weight loss.  HENT:  Positive for tinnitus. Negative for nosebleeds.   Eyes: Negative.  Negative for blurred vision, double vision and photophobia.  Respiratory: Negative.  Negative for cough and shortness of breath.   Cardiovascular: Negative.  Negative for chest pain, palpitations and leg swelling.  Gastrointestinal: Negative.  Negative for heartburn, nausea and vomiting.  Musculoskeletal: Negative.  Negative for myalgias.  Neurological:  Positive for headaches. Negative for dizziness, focal weakness and seizures.  Psychiatric/Behavioral: Negative.  Negative for suicidal ideas.     Past Medical History:  Diagnosis Date   Anxiety    Cancer ([place])    pt. reports had uterine cancer x 10 years ago   Chest pain 12/08/2013   GERD (gastroesophageal reflux disease) 08/10/2014   Hyperlipidemia    Hypertension    Murmur 12/08/2013    Past Surgical History:  Procedure Laterality Date   ABDOMINAL HYSTERECTOMY     ABDOMINAL SURGERY     COLONOSCOPY     UPPER GASTROINTESTINAL ENDOSCOPY  4/21/216    Family History  Problem Relation Age of Onset   Gallbladder disease Mother    Heart disease Father    Lung cancer Father    Brain cancer Father    Colon polyps Sister    Diabetes Sister        x3   Diabetes Brother    Breast cancer Paternal Aunt  Colon cancer Neg Hx    Kidney disease Neg Hx    Esophageal cancer Neg Hx    Inflammatory bowel disease Neg Hx    Liver disease Neg Hx    Pancreatic cancer Neg Hx    Rectal cancer Neg Hx    Stomach cancer Neg Hx     Social History Reviewed with no changes to be made today.   Outpatient Medications Prior to Visit  Medication Sig Dispense Refill   Blood Glucose Monitoring Suppl (TRUE METRIX METER) w/Device KIT use as directed daily before breakfast. 1 kit 0   clobetasol cream (TEMOVATE) 2.87 % Apply 1 application. topically 2 (two) times daily. Apply to the  vulva for 4 weeks. 60 g 0   clonazePAM (KLONOPIN) 1 MG tablet Take 1 mg by mouth 2 (two) times daily.     DULoxetine (CYMBALTA) 60 MG capsule Take 1 capsule (60 mg total) by mouth 2 (two) times daily. For back pain 60 capsule 3   glucose blood (TRUE METRIX BLOOD GLUCOSE TEST) test strip Use daily 100 each 12   lidocaine (LIDODERM) 5 % Place 1 patch onto the skin daily. Remove & Discard patch within 12 hours or as directed by MD 30 patch 3   methocarbamol (ROBAXIN) 500 MG tablet Take 1 tablet (500 mg total) by mouth every 8 (eight) hours as needed for muscle spasms. 60 tablet 6   promethazine (PHENERGAN) 12.5 MG tablet Take 1 to 2 tabs daily every 4 to 6 hours if needed for nausea 20 tablet 0   TRUEplus Lancets 28G MISC 1 each by Does not apply route 3 (three) times daily before meals. 100 each 12   benazepril (LOTENSIN) 20 MG tablet TAKE 1 TABLET (20 MG TOTAL) BY MOUTH DAILY. 90 tablet 1   esomeprazole (NEXIUM) 20 MG capsule TAKE 1 CAPSULE BY MOUTH DAILY BEFORE BREAKFAST. (Patient not taking: Reported on 06/20/2021) 30 capsule 2   No facility-administered medications prior to visit.    Allergies  Allergen Reactions   Amoxicillin Rash    Has patient had a PCN reaction causing immediate rash, facial/tongue/throat swelling, SOB or lightheadedness with hypotension: yes Has patient had a PCN reaction causing severe rash involving mucus membranes or skin necrosis: no Has patient had a PCN reaction that required hospitalization: no Has patient had a PCN reaction occurring within the last 10 years: no If all of the above answers are "NO", then may proceed with Cephalosporin use.    Penicillins Rash       Objective:    BP 135/85   Pulse 83   Temp 98 F (36.7 C) (Oral)   Ht 5' 3.5" (1.613 m)   Wt 236 lb (107 kg)   SpO2 99%   BMI 41.15 kg/m  Wt Readings from Last 3 Encounters:  01/28/22 236 lb (107 kg)  09/27/21 232 lb 9.6 oz (105.5 kg)  09/19/21 235 lb (106.6 kg)    Physical  Exam       Patient has been counseled extensively about nutrition and exercise as well as the importance of adherence with medications and regular follow-up. The patient was given clear instructions to go to ER or return to medical center if symptoms don't improve, worsen or new problems develop. The patient verbalized understanding.   Follow-up: Return in about 3 months (around 04/30/2022) for F/U HTN.   Gildardo Pounds, FNP-BC [facility] and [place], [place]   01/28/2022, 6:33 PM

## 2022-01-28 NOTE — Patient Instructions (Signed)
BP 100-130/60-80

## 2022-01-29 LAB — HEMOGLOBIN A1C
Est. average glucose Bld gHb Est-mCnc: 146 mg/dL
Hgb A1c MFr Bld: 6.7 % — ABNORMAL HIGH (ref 4.8–5.6)

## 2022-01-29 LAB — LIPID PANEL
Chol/HDL Ratio: 4.3 ratio (ref 0.0–4.4)
Cholesterol, Total: 198 mg/dL (ref 100–199)
HDL: 46 mg/dL (ref >39)
LDL Chol Calc (NIH): 123 mg/dL — ABNORMAL HIGH (ref 0–99)
Triglycerides: 163 mg/dL — ABNORMAL HIGH (ref 0–149)
VLDL Cholesterol Cal: 29 mg/dL (ref 5–40)

## 2022-04-10 ENCOUNTER — Other Ambulatory Visit: Payer: Self-pay

## 2022-05-05 ENCOUNTER — Ambulatory Visit: Payer: Self-pay | Attending: Family Medicine | Admitting: Family Medicine

## 2022-05-05 ENCOUNTER — Other Ambulatory Visit: Payer: Self-pay

## 2022-05-05 ENCOUNTER — Encounter: Payer: Self-pay | Admitting: Family Medicine

## 2022-05-05 VITALS — BP 120/75 | HR 78 | Temp 98.2°F | Ht 63.0 in | Wt 229.8 lb

## 2022-05-05 DIAGNOSIS — E1169 Type 2 diabetes mellitus with other specified complication: Secondary | ICD-10-CM

## 2022-05-05 DIAGNOSIS — R21 Rash and other nonspecific skin eruption: Secondary | ICD-10-CM

## 2022-05-05 DIAGNOSIS — I10 Essential (primary) hypertension: Secondary | ICD-10-CM

## 2022-05-05 LAB — POCT GLYCOSYLATED HEMOGLOBIN (HGB A1C): HbA1c, POC (controlled diabetic range): 6.3 % (ref 0.0–7.0)

## 2022-05-05 MED ORDER — ATORVASTATIN CALCIUM 20 MG PO TABS
20.0000 mg | ORAL_TABLET | Freq: Every day | ORAL | 1 refills | Status: DC
  Filled 2022-05-05 – 2022-05-15 (×2): qty 90, 90d supply, fill #0

## 2022-05-05 MED ORDER — BENAZEPRIL HCL 20 MG PO TABS
20.0000 mg | ORAL_TABLET | Freq: Every day | ORAL | 1 refills | Status: DC
  Filled 2022-05-05 – 2022-09-04 (×3): qty 90, 90d supply, fill #0
  Filled 2022-12-05: qty 90, 90d supply, fill #1

## 2022-05-05 MED ORDER — OZEMPIC (0.25 OR 0.5 MG/DOSE) 2 MG/1.5ML ~~LOC~~ SOPN
0.5000 mg | PEN_INJECTOR | SUBCUTANEOUS | 6 refills | Status: DC
  Filled 2022-05-05 – 2022-06-20 (×2): qty 2, 35d supply, fill #0

## 2022-05-05 MED ORDER — OZEMPIC (0.25 OR 0.5 MG/DOSE) 2 MG/3ML ~~LOC~~ SOPN
0.2500 mg | PEN_INJECTOR | SUBCUTANEOUS | 0 refills | Status: DC
  Filled 2022-05-05: qty 3, 28d supply, fill #0

## 2022-05-05 NOTE — Progress Notes (Signed)
Subjective:  Patient ID: Christina Burke, female    DOB: [birth year]  Age: 62 y.o. MRN: [id number]  CC: Diabetes   HPI SHERRIA RIEMANN is a 62 y.o. year old female with a history of hypertension, anxiety, hyperlipidemia history of endometrial CA confined to the uterus status post hysterectomy , esophageal dysphagia s/p esophageal dilatation, atherosclerotic cardiovascular disease, 2 diabetes mellitus (A1c 6.3)  Interval History:   She has noticed a forehead bump x1 month which is not tender, she denies history of preceding trauma. It has not changed in size but she notes that when the hairdresser applies dye to her hair she uses hand sanitizer to wipe her forehead.  She has been taking '20mg'$  of Benazepril rather than '30mg'$  on her chart.  At her last visit with the nurse practitioner her blood pressure was above goal and benazepril dose was increased from 20 mg to 30 mg. She has cut back on her alcohol and has also lost 6 pounds since her last visit.  She also had a new diagnosis of type 2 diabetes mellitus with an A1c of 6.7 but she states she is unaware of this.  Today A1c 6.3.  She was previously on metformin for prediabetes but never took it.  In the past she has also been adamant about not taking a statin but rather working on lifestyle modifications. She endorses craving a lot of sweets. Past Medical History:  Diagnosis Date   Anxiety    Cancer ([place])    pt. reports had uterine cancer x 10 years ago   Chest pain 12/08/2013   GERD (gastroesophageal reflux disease) 08/10/2014   Hyperlipidemia    Hypertension    Murmur 12/08/2013    Past Surgical History:  Procedure Laterality Date   ABDOMINAL HYSTERECTOMY     ABDOMINAL SURGERY     COLONOSCOPY     UPPER GASTROINTESTINAL ENDOSCOPY  4/21/216    Family History  Problem Relation Age of Onset   Gallbladder disease Mother    Heart disease Father    Lung cancer Father    Brain cancer Father    Colon polyps Sister    Diabetes  Sister        x3   Diabetes Brother    Breast cancer Paternal Aunt    Colon cancer Neg Hx    Kidney disease Neg Hx    Esophageal cancer Neg Hx    Inflammatory bowel disease Neg Hx    Liver disease Neg Hx    Pancreatic cancer Neg Hx    Rectal cancer Neg Hx    Stomach cancer Neg Hx     Social History   Socioeconomic History   Marital status: Single    Spouse name: Not on file   Number of children: 2   Years of education: Not on file   Highest education level: Some college, no degree  Occupational History   Occupation: Unemployed  Tobacco Use   Smoking status: Never    Passive exposure: Never   Smokeless tobacco: Never  Vaping Use   Vaping Use: Never used  Substance and Sexual Activity   Alcohol use: Yes    Alcohol/week: 6.0 standard drinks of alcohol    Types: 6 Cans of beer per week   Drug use: No   Sexual activity: Yes    Birth control/protection: None  Other Topics Concern   Not on file  Social History Narrative   Right handed   Social Determinants of Health   Financial  Resource Strain: Not on file  Food Insecurity: No Food Insecurity (01/08/2021)   Hunger Vital Sign    Worried About Running Out of Food in the Last Year: Never true    Ran Out of Food in the Last Year: Never true  Transportation Needs: No Transportation Needs (01/08/2021)   PRAPARE - Hydrologist (Medical): No    Lack of Transportation (Non-Medical): No  Physical Activity: Not on file  Stress: Not on file  Social Connections: Not on file    Allergies  Allergen Reactions   Amoxicillin Rash    Has patient had a PCN reaction causing immediate rash, facial/tongue/throat swelling, SOB or lightheadedness with hypotension: yes Has patient had a PCN reaction causing severe rash involving mucus membranes or skin necrosis: no Has patient had a PCN reaction that required hospitalization: no Has patient had a PCN reaction occurring within the last 10 years: no If all of  the above answers are "NO", then may proceed with Cephalosporin use.    Penicillins Rash    Outpatient Medications Prior to Visit  Medication Sig Dispense Refill   Blood Glucose Monitoring Suppl (TRUE METRIX METER) w/Device KIT use as directed daily before breakfast. 1 kit 0   clobetasol cream (TEMOVATE) 0.27 % Apply 1 application. topically 2 (two) times daily. Apply to the vulva for 4 weeks. 60 g 0   clonazePAM (KLONOPIN) 1 MG tablet Take 1 mg by mouth 2 (two) times daily.     DULoxetine (CYMBALTA) 60 MG capsule Take 1 capsule (60 mg total) by mouth 2 (two) times daily. For back pain 60 capsule 3   glucose blood (TRUE METRIX BLOOD GLUCOSE TEST) test strip Use daily 100 each 12   lidocaine (LIDODERM) 5 % Place 1 patch onto the skin daily. Remove & Discard patch within 12 hours or as directed by MD 30 patch 3   methocarbamol (ROBAXIN) 500 MG tablet Take 1 tablet (500 mg total) by mouth every 8 (eight) hours as needed for muscle spasms. 60 tablet 6   promethazine (PHENERGAN) 12.5 MG tablet Take 1 to 2 tabs daily every 4 to 6 hours if needed for nausea 20 tablet 0   TRUEplus Lancets 28G MISC 1 each by Does not apply route 3 (three) times daily before meals. 100 each 12   benazepril (LOTENSIN) 20 MG tablet Take 1.5 tablets (30 mg total) by mouth daily. 135 tablet 1   esomeprazole (NEXIUM) 20 MG capsule TAKE 1 CAPSULE BY MOUTH DAILY BEFORE BREAKFAST. (Patient not taking: Reported on 06/20/2021) 30 capsule 2   No facility-administered medications prior to visit.     ROS Review of Systems  Constitutional:  Negative for activity change and appetite change.  HENT:  Negative for sinus pressure and sore throat.   Respiratory:  Negative for chest tightness, shortness of breath and wheezing.   Cardiovascular:  Negative for chest pain and palpitations.  Gastrointestinal:  Negative for abdominal distention, abdominal pain and constipation.  Genitourinary: Negative.   Musculoskeletal: Negative.    Skin:  Positive for rash.  Psychiatric/Behavioral:  Negative for behavioral problems and dysphoric mood.     Objective:  BP 120/75   Pulse 78   Temp 98.2 F (36.8 C) (Oral)   Ht '5\' 3"'$  (1.6 m)   Wt 229 lb 12.8 oz (104.2 kg)   SpO2 98%   BMI 40.71 kg/m      05/05/2022   11:24 AM 01/28/2022    3:34 PM 09/27/2021  11:13 AM  BP/Weight  Systolic BP 323 557 322  Diastolic BP 75 85 79  Wt. (Lbs) 229.8 236 232.6  BMI 40.71 kg/m2 41.15 kg/m2 41.2 kg/m2      Physical Exam Constitutional:      Appearance: She is well-developed.  Cardiovascular:     Rate and Rhythm: Normal rate.     Heart sounds: Murmur heard.  Pulmonary:     Effort: Pulmonary effort is normal.     Breath sounds: Normal breath sounds. No wheezing or rales.  Chest:     Chest wall: No tenderness.  Abdominal:     General: Bowel sounds are normal. There is no distension.     Palpations: Abdomen is soft. There is no mass.     Tenderness: There is no abdominal tenderness.  Musculoskeletal:        General: Normal range of motion.     Right lower leg: No edema.     Left lower leg: No edema.  Skin:    Comments: Forehead with slight induration and superficial rough skin, not tender to palpation.  Lesion is normal pigmented  Neurological:     Mental Status: She is alert and oriented to person, place, and time.  Psychiatric:        Mood and Affect: Mood normal.        Latest Ref Rng & Units 09/27/2021   11:50 AM 09/19/2021   12:40 PM 06/20/2021   11:32 AM  CMP  Glucose 70 - 99 mg/dL 102  287  103   BUN 8 - 27 mg/dL '13  10  12   '$ Creatinine 0.57 - 1.00 mg/dL 0.78  0.97  0.79   Sodium 134 - 144 mmol/L 138  139  140   Potassium 3.5 - 5.2 mmol/L 4.6  4.1  4.8   Chloride 96 - 106 mmol/L 101  107  104   CO2 20 - 29 mmol/L '23  24  23   '$ Calcium 8.7 - 10.3 mg/dL 9.5  9.3  9.6   Total Protein 6.0 - 8.5 g/dL 7.3   7.3   Total Bilirubin 0.0 - 1.2 mg/dL 0.3   0.3   Alkaline Phos 44 - 121 IU/L 73   90   AST 0 - 40 IU/L 27    13   ALT 0 - 32 IU/L 32   19     Lipid Panel     Component Value Date/Time   CHOL 198 01/28/2022 1612   TRIG 163 (H) 01/28/2022 1612   HDL 46 01/28/2022 1612   CHOLHDL 4.3 01/28/2022 1612   CHOLHDL 3.9 04/08/2016 1025   VLDL 12 04/08/2016 1025   LDLCALC 123 (H) 01/28/2022 1612    CBC    Component Value Date/Time   WBC 6.6 09/19/2021 1240   RBC 4.37 09/19/2021 1240   HGB 13.8 09/19/2021 1240   HGB 13.2 06/20/2021 1132   HCT 40.7 09/19/2021 1240   HCT 39.5 06/20/2021 1132   PLT 292 09/19/2021 1240   PLT 268 06/20/2021 1132   MCV 93.1 09/19/2021 1240   MCV 93 06/20/2021 1132   MCH 31.6 09/19/2021 1240   MCHC 33.9 09/19/2021 1240   RDW 12.7 09/19/2021 1240   RDW 12.8 06/20/2021 1132   LYMPHSABS 1.6 06/20/2021 1132   MONOABS 0.3 03/01/2015 1600   EOSABS 0.1 06/20/2021 1132   BASOSABS 0.1 06/20/2021 1132    Lab Results  Component Value Date   HGBA1C 6.3 05/05/2022    Assessment &  Plan:  1. Type 2 diabetes mellitus with other specified complication, without long-term current use of insulin (HCC) A1c has trended down from 6.7 to 6.3 We have discussed the role of GLP-1 RA in glycemic control with additional weight loss and cardiovascular benefits and after shared decision making she has consented to commence this.  Side effects discussed. Offered to have the clinical pharmacist educate her on administration but she states her friend will be helping her with this. Counseled on Diabetic diet, my plate method, 397 minutes of moderate intensity exercise/week Blood sugar logs with fasting goals of 80-120 mg/dl, random of less than 180 and in the event of sugars less than 60 mg/dl or greater than 400 mg/dl encouraged to notify the clinic. Advised on the need for annual eye exams, annual foot exams, Pneumonia vaccine. - POCT glycosylated hemoglobin (Hb A1C) - Semaglutide,0.25 or 0.'5MG'$ /DOS, (OZEMPIC, 0.25 OR 0.5 MG/DOSE,) 2 MG/3ML SOPN; Inject 0.25 mg into the skin once a week.  For 4 weeks then increase to 0.5 mg  Dispense: 3 mL; Refill: 0 - atorvastatin (LIPITOR) 20 MG tablet; Take 1 tablet (20 mg total) by mouth daily.  Dispense: 90 tablet; Refill: 1 - Microalbumin / creatinine urine ratio - LP+Non-HDL Cholesterol - CMP14+EGFR - Semaglutide,0.25 or 0.'5MG'$ /DOS, (OZEMPIC, 0.25 OR 0.5 MG/DOSE,) 2 MG/1.5ML SOPN; Inject 0.5 mg into the skin once a week.  Dispense: 2 mL; Refill: 6  2. Essential hypertension Controlled Discontinued 30 mg of benazepril and she is back on 20 mg which she took previously Counseled on blood pressure goal of less than 130/80, low-sodium, DASH diet, medication compliance, 150 minutes of moderate intensity exercise per week. Discussed medication compliance, adverse effects. - benazepril (LOTENSIN) 20 MG tablet; Take 1 tablet (20 mg total) by mouth daily.  Dispense: 90 tablet; Refill: 1  3. Morbid obesity (HCC) Caloric restriction, increase exercise activity GLP-1 RA will be beneficial  4. Skin rash She has been advised to discuss with her hairdresser regarding toxic skin chemicals which can cause skin changes She will monitor this lesion and if any changes noted inform me so I can refer her to dermatology for possible biopsy   Health Care Maintenance: PCV 20 at next visit  Meds ordered this encounter  Medications   Semaglutide,0.25 or 0.'5MG'$ /DOS, (OZEMPIC, 0.25 OR 0.5 MG/DOSE,) 2 MG/3ML SOPN    Sig: Inject 0.25 mg into the skin once a week. For 4 weeks then increase to 0.5 mg    Dispense:  3 mL    Refill:  0   benazepril (LOTENSIN) 20 MG tablet    Sig: Take 1 tablet (20 mg total) by mouth daily.    Dispense:  90 tablet    Refill:  1    Discontinue previous dose   atorvastatin (LIPITOR) 20 MG tablet    Sig: Take 1 tablet (20 mg total) by mouth daily.    Dispense:  90 tablet    Refill:  1   Semaglutide,0.25 or 0.'5MG'$ /DOS, (OZEMPIC, 0.25 OR 0.5 MG/DOSE,) 2 MG/1.5ML SOPN    Sig: Inject 0.5 mg into the skin once a week.    Dispense:   2 mL    Refill:  6    Follow-up: Return in about 3 months (around 08/04/2022) for Chronic medical conditions.       Charlott Rakes, MD, FAAFP. [facility] [place], [place]   05/05/2022, 12:27 PM

## 2022-05-05 NOTE — Progress Notes (Signed)
Bump on forehead for 1 month.

## 2022-05-05 NOTE — Patient Instructions (Signed)
Semaglutide Injection What is this medication? SEMAGLUTIDE (SEM a GLOO tide) treats type 2 diabetes. It works by increasing insulin levels in your body, which decreases your blood sugar (glucose). It also reduces the amount of sugar released into the blood and slows down your digestion. It can also be used to lower the risk of heart attack and stroke in people with type 2 diabetes. Changes to diet and exercise are often combined with this medication. This medicine may be used for other purposes; ask your health care provider or pharmacist if you have questions. COMMON BRAND NAME(S): OZEMPIC What should I tell my care team before I take this medication? They need to know if you have any of these conditions: Endocrine tumors (MEN 2) or if someone in your family had these tumors Eye disease, vision problems History of pancreatitis Kidney disease Stomach problems Thyroid cancer or if someone in your family had thyroid cancer An unusual or allergic reaction to semaglutide, other medications, foods, dyes, or preservatives Pregnant or trying to get pregnant Breast-feeding How should I use this medication? This medication is for injection under the skin of your upper leg (thigh), stomach area, or upper arm. It is given once every week (every 7 days). You will be taught how to prepare and give this medication. Use exactly as directed. Take your medication at regular intervals. Do not take it more often than directed. If you use this medication with insulin, you should inject this medication and the insulin separately. Do not mix them together. Do not give the injections right next to each other. Change (rotate) injection sites with each injection. It is important that you put your used needles and syringes in a special sharps container. Do not put them in a trash can. If you do not have a sharps container, call your pharmacist or care team to get one. A special MedGuide will be given to you by the  pharmacist with each prescription and refill. Be sure to read this information carefully each time. This medication comes with INSTRUCTIONS FOR USE. Ask your pharmacist for directions on how to use this medication. Read the information carefully. Talk to your pharmacist or care team if you have questions. Talk to your care team about the use of this medication in children. Special care may be needed. Overdosage: If you think you have taken too much of this medicine contact a poison control center or emergency room at once. NOTE: This medicine is only for you. Do not share this medicine with others. What if I miss a dose? If you miss a dose, take it as soon as you can within 5 days after the missed dose. Then take your next dose at your regular weekly time. If it has been longer than 5 days after the missed dose, do not take the missed dose. Take the next dose at your regular time. Do not take double or extra doses. If you have questions about a missed dose, contact your care team for advice. What may interact with this medication? Other medications for diabetes Many medications may cause changes in blood sugar, these include: Alcohol containing beverages Antiviral medications for HIV or AIDS Aspirin and aspirin-like medications Certain medications for blood pressure, heart disease, irregular heart beat Chromium Diuretics Female hormones, such as estrogens or progestins, birth control pills Fenofibrate Gemfibrozil Isoniazid Lanreotide Female hormones or anabolic steroids MAOIs like Carbex, Eldepryl, Marplan, Nardil, and Parnate Medications for weight loss Medications for allergies, asthma, cold, or cough Medications for depression,   anxiety, or psychotic disturbances Niacin Nicotine NSAIDs, medications for pain and inflammation, like ibuprofen or naproxen Octreotide Pasireotide Pentamidine Phenytoin Probenecid Quinolone antibiotics such as ciprofloxacin, levofloxacin, ofloxacin Some  herbal dietary supplements Steroid medications such as prednisone or cortisone Sulfamethoxazole; trimethoprim Thyroid hormones Some medications can hide the warning symptoms of low blood sugar (hypoglycemia). You may need to monitor your blood sugar more closely if you are taking one of these medications. These include: Beta-blockers, often used for high blood pressure or heart problems (examples include atenolol, metoprolol, propranolol) Clonidine Guanethidine Reserpine This list may not describe all possible interactions. Give your health care provider a list of all the medicines, herbs, non-prescription drugs, or dietary supplements you use. Also tell them if you smoke, drink alcohol, or use illegal drugs. Some items may interact with your medicine. What should I watch for while using this medication? Visit your care team for regular checks on your progress. Drink plenty of fluids while taking this medication. Check with your care team if you get an attack of severe diarrhea, nausea, and vomiting. The loss of too much body fluid can make it dangerous for you to take this medication. A test called the HbA1C (A1C) will be monitored. This is a simple blood test. It measures your blood sugar control over the last 2 to 3 months. You will receive this test every 3 to 6 months. Learn how to check your blood sugar. Learn the symptoms of low and high blood sugar and how to manage them. Always carry a quick-source of sugar with you in case you have symptoms of low blood sugar. Examples include hard sugar candy or glucose tablets. Make sure others know that you can choke if you eat or drink when you develop serious symptoms of low blood sugar, such as seizures or unconsciousness. They must get medical help at once. Tell your care team if you have high blood sugar. You might need to change the dose of your medication. If you are sick or exercising more than usual, you might need to change the dose of your  medication. Do not skip meals. Ask your care team if you should avoid alcohol. Many nonprescription cough and cold products contain sugar or alcohol. These can affect blood sugar. Pens should never be shared. Even if the needle is changed, sharing may result in passing of viruses like hepatitis or HIV. Wear a medical ID bracelet or chain, and carry a card that describes your disease and details of your medication and dosage times. Do not become pregnant while taking this medication. Women should inform their care team if they wish to become pregnant or think they might be pregnant. There is a potential for serious side effects to an unborn child. Talk to your care team for more information. What side effects may I notice from receiving this medication? Side effects that you should report to your care team as soon as possible: Allergic reactions--skin rash, itching, hives, swelling of the face, lips, tongue, or throat Change in vision Dehydration--increased thirst, dry mouth, feeling faint or lightheaded, headache, dark yellow or brown urine Gallbladder problems--severe stomach pain, nausea, vomiting, fever Heart palpitations--rapid, pounding, or irregular heartbeat Kidney injury--decrease in the amount of urine, swelling of the ankles, hands, or feet Pancreatitis--severe stomach pain that spreads to your back or gets worse after eating or when touched, fever, nausea, vomiting Thyroid cancer--new mass or lump in the neck, pain or trouble swallowing, trouble breathing, hoarseness Side effects that usually do not require medical   attention (report to your care team if they continue or are bothersome): Diarrhea Loss of appetite Nausea Stomach pain Vomiting This list may not describe all possible side effects. Call your doctor for medical advice about side effects. You may report side effects to FDA at 1-800-FDA-1088. Where should I keep my medication? Keep out of the reach of children. Store  unopened pens in a refrigerator between 2 and 8 degrees C (36 and 46 degrees F). Do not freeze. Protect from light and heat. After you first use the pen, it can be stored for 56 days at room temperature between 15 and 30 degrees C (59 and 86 degrees F) or in a refrigerator. Throw away your used pen after 56 days or after the expiration date, whichever comes first. Do not store your pen with the needle attached. If the needle is left on, medication may leak from the pen. NOTE: This sheet is a summary. It may not cover all possible information. If you have questions about this medicine, talk to your doctor, pharmacist, or health care provider.  2023 Elsevier/Gold Standard (2020-06-28 00:00:00)  

## 2022-05-06 ENCOUNTER — Other Ambulatory Visit: Payer: Self-pay

## 2022-05-06 LAB — CMP14+EGFR
ALT: 25 IU/L (ref 0–32)
AST: 22 IU/L (ref 0–40)
Albumin/Globulin Ratio: 1.6 (ref 1.2–2.2)
Albumin: 4.5 g/dL (ref 3.9–4.9)
Alkaline Phosphatase: 88 IU/L (ref 44–121)
BUN/Creatinine Ratio: 11 — ABNORMAL LOW (ref 12–28)
BUN: 10 mg/dL (ref 8–27)
Bilirubin Total: 0.3 mg/dL (ref 0.0–1.2)
CO2: 22 mmol/L (ref 20–29)
Calcium: 9.4 mg/dL (ref 8.7–10.3)
Chloride: 100 mmol/L (ref 96–106)
Creatinine, Ser: 0.89 mg/dL (ref 0.57–1.00)
Globulin, Total: 2.9 g/dL (ref 1.5–4.5)
Glucose: 120 mg/dL — ABNORMAL HIGH (ref 70–99)
Potassium: 4.4 mmol/L (ref 3.5–5.2)
Sodium: 139 mmol/L (ref 134–144)
Total Protein: 7.4 g/dL (ref 6.0–8.5)
eGFR: 74 mL/min/{1.73_m2} (ref >59)

## 2022-05-06 LAB — MICROALBUMIN / CREATININE URINE RATIO
Creatinine, Urine: 182.2 mg/dL
Microalb/Creat Ratio: 4 mg/g creat (ref 0–29)
Microalbumin, Urine: 7.2 ug/mL

## 2022-05-06 LAB — LP+NON-HDL CHOLESTEROL
Cholesterol, Total: 196 mg/dL (ref 100–199)
HDL: 39 mg/dL — ABNORMAL LOW (ref >39)
LDL Chol Calc (NIH): 118 mg/dL — ABNORMAL HIGH (ref 0–99)
Total Non-HDL-Chol (LDL+VLDL): 157 mg/dL — ABNORMAL HIGH (ref 0–129)
Triglycerides: 222 mg/dL — ABNORMAL HIGH (ref 0–149)
VLDL Cholesterol Cal: 39 mg/dL (ref 5–40)

## 2022-05-07 ENCOUNTER — Other Ambulatory Visit: Payer: Self-pay

## 2022-05-08 ENCOUNTER — Encounter: Payer: Self-pay | Admitting: Family Medicine

## 2022-05-08 ENCOUNTER — Other Ambulatory Visit: Payer: Self-pay

## 2022-05-09 ENCOUNTER — Other Ambulatory Visit: Payer: Self-pay

## 2022-05-09 NOTE — Telephone Encounter (Signed)
This encounter was created in error - please disregard.

## 2022-05-12 ENCOUNTER — Other Ambulatory Visit: Payer: Self-pay

## 2022-05-13 ENCOUNTER — Other Ambulatory Visit: Payer: Self-pay

## 2022-05-15 ENCOUNTER — Other Ambulatory Visit: Payer: Self-pay

## 2022-06-05 ENCOUNTER — Emergency Department (HOSPITAL_COMMUNITY)
Admission: EM | Admit: 2022-06-05 | Discharge: 2022-06-05 | Disposition: A | Discharge status: Home / Self Care | Payer: Medicaid Other | Attending: Emergency Medicine | Admitting: Emergency Medicine

## 2022-06-05 ENCOUNTER — Encounter (HOSPITAL_COMMUNITY): Payer: Self-pay

## 2022-06-05 ENCOUNTER — Emergency Department (HOSPITAL_COMMUNITY): Payer: Medicaid Other

## 2022-06-05 ENCOUNTER — Other Ambulatory Visit: Payer: Self-pay

## 2022-06-05 DIAGNOSIS — R112 Nausea with vomiting, unspecified: Secondary | ICD-10-CM | POA: Diagnosis present

## 2022-06-05 DIAGNOSIS — E119 Type 2 diabetes mellitus without complications: Secondary | ICD-10-CM | POA: Diagnosis not present

## 2022-06-05 DIAGNOSIS — Z79899 Other long term (current) drug therapy: Secondary | ICD-10-CM | POA: Insufficient documentation

## 2022-06-05 DIAGNOSIS — R748 Abnormal levels of other serum enzymes: Secondary | ICD-10-CM | POA: Insufficient documentation

## 2022-06-05 DIAGNOSIS — K8020 Calculus of gallbladder without cholecystitis without obstruction: Secondary | ICD-10-CM | POA: Diagnosis not present

## 2022-06-05 DIAGNOSIS — Z794 Long term (current) use of insulin: Secondary | ICD-10-CM | POA: Diagnosis not present

## 2022-06-05 DIAGNOSIS — E878 Other disorders of electrolyte and fluid balance, not elsewhere classified: Secondary | ICD-10-CM | POA: Insufficient documentation

## 2022-06-05 DIAGNOSIS — I10 Essential (primary) hypertension: Secondary | ICD-10-CM | POA: Insufficient documentation

## 2022-06-05 LAB — URINALYSIS, ROUTINE W REFLEX MICROSCOPIC
Glucose, UA: NEGATIVE mg/dL
Hgb urine dipstick: NEGATIVE
Ketones, ur: NEGATIVE mg/dL
Leukocytes,Ua: NEGATIVE
Nitrite: NEGATIVE
Protein, ur: NEGATIVE mg/dL
Specific Gravity, Urine: 1.018 (ref 1.005–1.030)
pH: 5 (ref 5.0–8.0)

## 2022-06-05 LAB — COMPREHENSIVE METABOLIC PANEL
ALT: 354 U/L — ABNORMAL HIGH (ref 0–44)
AST: 361 U/L — ABNORMAL HIGH (ref 15–41)
Albumin: 4 g/dL (ref 3.5–5.0)
Alkaline Phosphatase: 137 U/L — ABNORMAL HIGH (ref 38–126)
Anion gap: 11 (ref 5–15)
BUN: 10 mg/dL (ref 8–23)
CO2: 26 mmol/L (ref 22–32)
Calcium: 10.1 mg/dL (ref 8.9–10.3)
Chloride: 100 mmol/L (ref 98–111)
Creatinine, Ser: 1.02 mg/dL — ABNORMAL HIGH (ref 0.44–1.00)
GFR, Estimated: >60 mL/min (ref >60)
Glucose, Bld: 108 mg/dL — ABNORMAL HIGH (ref 70–99)
Potassium: 4.3 mmol/L (ref 3.5–5.1)
Sodium: 137 mmol/L (ref 135–145)
Total Bilirubin: 3.4 mg/dL — ABNORMAL HIGH (ref 0.3–1.2)
Total Protein: 7.5 g/dL (ref 6.5–8.1)

## 2022-06-05 LAB — CBC
HCT: 41.9 % (ref 36.0–46.0)
Hemoglobin: 13.9 g/dL (ref 12.0–15.0)
MCH: 30.3 pg (ref 26.0–34.0)
MCHC: 33.2 g/dL (ref 30.0–36.0)
MCV: 91.3 fL (ref 80.0–100.0)
Platelets: 308 10*3/uL (ref 150–400)
RBC: 4.59 MIL/uL (ref 3.87–5.11)
RDW: 12.9 % (ref 11.5–15.5)
WBC: 6 10*3/uL (ref 4.0–10.5)
nRBC: 0 % (ref 0.0–0.2)

## 2022-06-05 LAB — LIPASE, BLOOD: Lipase: 54 U/L — ABNORMAL HIGH (ref 11–51)

## 2022-06-05 MED ORDER — ONDANSETRON 4 MG PO TBDP: 4.0000 mg | ORAL_TABLET | Freq: Three times a day (TID) | ORAL | 0 refills | Status: DC | PRN

## 2022-06-05 MED ORDER — IOHEXOL 350 MG/ML SOLN
75.0000 mL | Freq: Once | INTRAVENOUS | Status: AC | PRN
  Administered 2022-06-05: 75 mL via INTRAVENOUS

## 2022-06-05 MED ORDER — ONDANSETRON HCL 4 MG/2ML IJ SOLN
4.0000 mg | Freq: Once | INTRAMUSCULAR | Status: AC
  Administered 2022-06-05: 4 mg via INTRAVENOUS
  Filled 2022-06-05: qty 2

## 2022-06-05 MED ORDER — LACTATED RINGERS IV BOLUS
1000.0000 mL | Freq: Once | INTRAVENOUS | Status: AC
  Administered 2022-06-05: 1000 mL via INTRAVENOUS

## 2022-06-05 MED ORDER — FENTANYL CITRATE PF 50 MCG/ML IJ SOSY: 50.0000 ug | PREFILLED_SYRINGE | INTRAMUSCULAR | Status: DC | PRN

## 2022-06-05 NOTE — ED Notes (Signed)
PO fluid challenge performed on pt. Pt able drink water. No nausea and vomiting at this time.

## 2022-06-05 NOTE — ED Provider Notes (Signed)
[facility] Provider Note   CSN: [id number] Arrival date & time: 06/05/22  1545     History {Add pertinent medical, surgical, social history, OB history to HPI:1} Chief Complaint  Patient presents with   Nausea   Emesis   Back Pain    Christina Burke is a 62 y.o. female.   Emesis Back Pain  Patient is a 62 year old female with a past medical history significant for DM2, HTN, HLD, endometrial cancer status post hysterectomy  Patient recently started Ozempic 4.  DM 2 to 3 weeks ago.  She states her last shot was Friday.  She indicates that yesterday she developed some abdominal pain in her right upper quadrant and epigastric part of her abdomen.  She indicates these areas by pointing.  She states that the pain lasted a good portion of the day and then resolved after an episode of vomiting.  She states since then she has had some mild nausea but no pain.  She came to the ER today because of concerns for her symptoms she was having yesterday.  She states she feels well no chest pain difficulty breathing.  No lightheadedness or dizziness.  Normal bowel and bladder movements.  No fevers lightheadedness or dizziness.  No blood in stool.     Home Medications Prior to Admission medications   Medication Sig Start Date End Date Taking? Authorizing Provider  ondansetron (ZOFRAN-ODT) 4 MG disintegrating tablet Take 1 tablet (4 mg total) by mouth every 8 (eight) hours as needed for nausea or vomiting. 06/05/22  Yes Attilio Zeitler S, PA  atorvastatin (LIPITOR) 20 MG tablet Take 1 tablet (20 mg total) by mouth daily. 05/05/22   Charlott Rakes, MD  benazepril (LOTENSIN) 20 MG tablet Take 1 tablet (20 mg total) by mouth daily. 05/05/22   Charlott Rakes, MD  Blood Glucose Monitoring Suppl (TRUE METRIX METER) w/Device KIT use as directed daily before breakfast. 09/27/21   Charlott Rakes, MD  clobetasol cream (TEMOVATE) [id number] % Apply 1 application. topically  2 (two) times daily. Apply to the vulva for 4 weeks. 09/27/21   Charlott Rakes, MD  clonazePAM (KLONOPIN) 1 MG tablet Take 1 mg by mouth 2 (two) times daily.    [provider]  DULoxetine (CYMBALTA) 60 MG capsule Take 1 capsule (60 mg total) by mouth 2 (two) times daily. For back pain 06/28/19   Raulkar, Clide Deutscher, MD  esomeprazole (NEXIUM) 20 MG capsule TAKE 1 CAPSULE BY MOUTH DAILY BEFORE BREAKFAST. Patient not taking: Reported on 06/20/2021 06/27/20 06/27/21  Mansouraty, Telford Nab., MD  glucose blood (TRUE METRIX BLOOD GLUCOSE TEST) test strip Use daily 09/27/21   Charlott Rakes, MD  lidocaine (LIDODERM) 5 % Place 1 patch onto the skin daily. Remove & Discard patch within 12 hours or as directed by MD 12/13/19   Charlott Rakes, MD  methocarbamol (ROBAXIN) 500 MG tablet Take 1 tablet (500 mg total) by mouth every 8 (eight) hours as needed for muscle spasms. 05/04/19   Charlott Rakes, MD  promethazine (PHENERGAN) 12.5 MG tablet Take 1 to 2 tabs daily every 4 to 6 hours if needed for nausea 06/20/21   Argentina Donovan, PA-C  Semaglutide,0.25 or 0.5MG/DOS, (OZEMPIC, 0.25 OR 0.5 MG/DOSE,) 2 MG/1.5ML SOPN Inject 0.5 mg into the skin once a week. 05/05/22   Charlott Rakes, MD  Semaglutide,0.25 or 0.5MG/DOS, (OZEMPIC, 0.25 OR 0.5 MG/DOSE,) 2 MG/3ML SOPN Inject 0.25 mg into the skin once a week. For 4 weeks  then increase to 0.5 mg 05/05/22   Charlott Rakes, MD  TRUEplus Lancets 28G MISC 1 each by Does not apply route 3 (three) times daily before meals. 09/27/21   Charlott Rakes, MD      Allergies    Amoxicillin and Penicillins    Review of Systems   Review of Systems  Gastrointestinal:  Positive for vomiting.  Musculoskeletal:  Positive for back pain.    Physical Exam Updated Vital Signs BP 136/67   Pulse 72   Temp 98.9 F (37.2 C) (Oral)   Resp 17   Ht 5' 3.5" (1.613 m)   Wt 102.1 kg   SpO2 98%   BMI 39.23 kg/m  Physical Exam Vitals and nursing note reviewed.  Constitutional:       General: She is not in acute distress.    Comments: 62 year old female pleasant, able answer questions appropriate follow commands, no acute distress.  HENT:     Head: Normocephalic and atraumatic.     Nose: Nose normal.     Mouth/Throat:     Mouth: Mucous membranes are moist.  Eyes:     General: No scleral icterus. Cardiovascular:     Rate and Rhythm: Normal rate and regular rhythm.     Pulses: Normal pulses.     Heart sounds: Normal heart sounds.  Pulmonary:     Effort: Pulmonary effort is normal. No respiratory distress.     Breath sounds: No wheezing.  Abdominal:     Palpations: Abdomen is soft.     Tenderness: There is abdominal tenderness.     Comments: Patient has mild right lower quadrant tenderness no guarding or rebound.  Musculoskeletal:     Cervical back: Normal range of motion.     Right lower leg: No edema.     Left lower leg: No edema.  Skin:    General: Skin is warm and dry.     Capillary Refill: Capillary refill takes less than 2 seconds.     Comments: Skin without jaundice, eyes without icterus  Neurological:     Mental Status: She is alert. Mental status is at baseline.  Psychiatric:        Mood and Affect: Mood normal.        Behavior: Behavior normal.     ED Results / Procedures / Treatments   Labs (all labs ordered are listed, but only abnormal results are displayed) Labs Reviewed  LIPASE, BLOOD - Abnormal; Notable for the following components:      Result Value   Lipase 54 (*)    All other components within normal limits  COMPREHENSIVE METABOLIC PANEL - Abnormal; Notable for the following components:   Glucose, Bld 108 (*)    Creatinine, Ser 1.02 (*)    AST 361 (*)    ALT 354 (*)    Alkaline Phosphatase 137 (*)    Total Bilirubin 3.4 (*)    All other components within normal limits  URINALYSIS, ROUTINE W REFLEX MICROSCOPIC - Abnormal; Notable for the following components:   Color, Urine AMBER (*)    Bilirubin Urine SMALL (*)    All other  components within normal limits  CBC    EKG None  Radiology CT ABDOMEN PELVIS W CONTRAST  Result Date: 06/05/2022 CLINICAL DATA:  Back pain vomiting EXAM: CT ABDOMEN AND PELVIS WITH CONTRAST TECHNIQUE: Multidetector CT imaging of the abdomen and pelvis was performed using the standard protocol following bolus administration of intravenous contrast. RADIATION DOSE REDUCTION: This exam was performed  according to the departmental dose-optimization program which includes automated exposure control, adjustment of the mA and/or kV according to patient size and/or use of iterative reconstruction technique. CONTRAST:  25m OMNIPAQUE IOHEXOL 350 MG/ML SOLN COMPARISON:  CT 03/20/2020 FINDINGS: Lower chest: Lung bases are clear Hepatobiliary: Gallstones. Hepatic steatosis. No biliary dilatation or right upper quadrant inflammation Pancreas: Unremarkable. No pancreatic ductal dilatation or surrounding inflammatory changes. Spleen: Normal in size without focal abnormality. Adrenals/Urinary Tract: Adrenal glands are within normal limits. No convincing hydronephrosis. The bladder is normal Stomach/Bowel: Stomach is within normal limits. Appendix appears normal. No evidence of bowel wall thickening, distention, or inflammatory changes. Vascular/Lymphatic: Mild aortic atherosclerosis. No aneurysm. No suspicious lymph nodes. Reproductive: Status post hysterectomy. No adnexal masses. Other: Negative for pelvic effusion or free air. Small fat containing umbilical hernia Musculoskeletal: No acute or significant osseous findings. IMPRESSION: 1. No CT evidence for acute intra-abdominal or pelvic abnormality. 2. Hepatic steatosis. 3. Gallstones. 4. Aortic atherosclerosis. Aortic Atherosclerosis (ICD10-I70.0). Electronically Signed   By: KDonavan FoilM.D.   On: 06/05/2022 20:24    Procedures Procedures  {Document cardiac monitor, telemetry assessment procedure when appropriate:1}  Medications Ordered in ED Medications   lactated ringers bolus 1,000 mL (0 mLs Intravenous Stopped 06/05/22 2030)  ondansetron (ZOFRAN) injection 4 mg (4 mg Intravenous Given 06/05/22 1855)  iohexol (OMNIPAQUE) 350 MG/ML injection 75 mL (75 mLs Intravenous Contrast Given 06/05/22 2015)    ED Course/ Medical Decision Making/ A&P Clinical Course as of 06/06/22 2130  Thu Jun 05, 2022  1817 Ozempic 3 weeks ago Last shot Friday (weekly) [WF]    Clinical Course User Index [WF] FTedd Sias PA   {   Click here for ABCD2, HEART and other calculatorsREFRESH Note before signing :1}                          Medical Decision Making Amount and/or Complexity of Data Reviewed Labs: ordered. Radiology: ordered.  Risk Prescription drug management.   This patient presents to the ED for concern of nominal pain which is now resolved and persistent nausea, this involves a number of treatment options, and is a complaint that carries with it a moderate to high risk of complications and morbidity. A differential diagnosis was considered for the patient's symptoms which is discussed below:   The causes of generalized abdominal pain include but are not limited to AAA, mesenteric ischemia, appendicitis, diverticulitis, DKA, gastritis, gastroenteritis, AMI, nephrolithiasis, pancreatitis, peritonitis, adrenal insufficiency,lead poisoning, iron toxicity, intestinal ischemia, constipation, UTI,SBO/LBO, splenic rupture, biliary disease, IBD, IBS, PUD, or hepatitis. Ectopic pregnancy, ovarian torsion, PID.    Co morbidities: Discussed in HPI   Brief History:  Patient is a 62year old female with a past medical history significant for DM2, HTN, HLD, endometrial cancer status post hysterectomy  Patient recently started Ozempic 4.  DM 2 to 3 weeks ago.  She states her last shot was Friday.  She indicates that yesterday she developed some abdominal pain in her right upper quadrant and epigastric part of her abdomen.  She indicates these areas by  pointing.  She states that the pain lasted a good portion of the day and then resolved after an episode of vomiting.  She states since then she has had some mild nausea but no pain.  She came to the ER today because of concerns for her symptoms she was having yesterday.  She states she feels well no chest pain difficulty breathing.  No lightheadedness  or dizziness.  Normal bowel and bladder movements.  No fevers lightheadedness or dizziness.  No blood in stool.    EMR reviewed including pt PMHx, past surgical history and past visits to ER.   See HPI for more details   Lab Tests:   {Blank single:19197::"I ordered and independently interpreted labs. Labs notable for","I personally reviewed all laboratory work and imaging. Metabolic panel without any acute abnormality specifically kidney function within normal limits and no significant electrolyte abnormalities. CBC without leukocytosis or significant anemia."}   Imaging Studies:  {Blank single:19197::"NAD. I personally reviewed all imaging studies and no acute abnormality found. I agree with radiology interpretation.","Abnormal findings. I personally reviewed all imaging studies. Imaging notable for","No imaging studies ordered for this patient"}    Cardiac Monitoring:  {Blank single:19197::"The patient was maintained on a cardiac monitor.  I personally viewed and interpreted the cardiac monitored which showed an underlying rhythm of:","NA"} {Blank single:19197::"EKG non-ischemic","NA"}   Medicines ordered:  I ordered medication including ***  for *** Reevaluation of the patient after these medicines showed that the patient {resolved/improved/worsened:23923::"improved"} I have reviewed the patients home medicines and have made adjustments as needed   Critical Interventions:  ***   Consults/Attending Physician   {Blank single:19197::"I requested consultation with ***,  and discussed lab and imaging findings as well as  pertinent plan - they recommend: ***","I discussed this case with my attending physician who cosigned this note including patient's presenting symptoms, physical exam, and planned diagnostics and interventions. Attending physician stated agreement with plan or made changes to plan which were implemented."}   Reevaluation:  After the interventions noted above I re-evaluated patient and found that they have :{resolved/improved/worsened:23923::"improved"}   Social Determinants of Health:  {Blank single:19197::"Given cab voucher","Social work/case management involved","The patient's social determinants of health were a factor in the care of this patient"}    Problem List / ED Course:  ***   Dispostion:  After consideration of the diagnostic results and the patients response to treatment, I feel that the patent would benefit from ***       Final Clinical Impression(s) / ED Diagnoses Final diagnoses:  Calculus of gallbladder without cholecystitis without obstruction    Rx / DC Orders ED Discharge Orders          Ordered    ondansetron (ZOFRAN-ODT) 4 MG disintegrating tablet  Every 8 hours PRN        06/05/22 2258

## 2022-06-05 NOTE — ED Triage Notes (Signed)
Pt came in via POV d/t back pain that started yesterday & last night it moved to her chest & then her abd. Once she vomited (only once so far) the pain went away. But since then her nausea has persisted even when sipping on water. Does endorse starting Ozempic recently. A/Ox4.

## 2022-06-05 NOTE — Discharge Instructions (Addendum)
We discussed I have a suspicion that you have passed a biliary stone yesterday which caused her pain.  If you experience any fevers, yellowing of your skin or eyes, or recurrence of pain please return immediately to the emergency room.  Otherwise please follow-up with the general surgeon have given you the information for.  Use Zofran 3 doses once every 8 hours after this use as needed for nausea.  Avoid alcohol, follow gallbladder eating plan.

## 2022-06-09 ENCOUNTER — Encounter: Payer: Self-pay | Admitting: Family Medicine

## 2022-06-17 ENCOUNTER — Ambulatory Visit: Payer: Self-pay | Admitting: Surgery

## 2022-06-17 NOTE — H&P (Signed)
Chief Complaint: New Consultation (Evaluation of Calculus of Gallbladder)       History of Present Illness: Christina Burke is a 62 y.o. female who is seen today as an office consultation at the request of Dr. Room for evaluation of New Consultation (Evaluation of Calculus of Gallbladder) .     This is a 62 year old female who presents with a 3-year history of documented cholelithiasis.  The patient does not remember the previous episode in 2021.  Recently she developed severe upper abdominal pain, back pain, lower chest pain associated with severe nausea and vomiting.  Her pain spontaneously resolved but the nausea persisted.  She went to the emergency department for evaluation.  A CT scan was performed that showed cholelithiasis without sign of cholecystitis.  Her total bilirubin was elevated at 3.4.  Her lipase was mildly elevated.  AST and ALT were elevated.  Since she was asymptomatic, the ED PA decided to send the patient home.  Fortunately she did not develop any further jaundice.  Her symptoms have been mild.  She mostly has intermittent nausea and no abdominal pain.   The patient started on Ozempic 1 month ago.  She is only lost about 5 pounds.  She gives herself injections on Fridays. Review of Systems: A complete review of systems was obtained from the patient.  I have reviewed this information and discussed as appropriate with the patient.  See HPI as well for other ROS.   Review of Systems  Constitutional: Negative.   HENT: Negative.    Eyes: Negative.   Respiratory: Negative.    Cardiovascular: Negative.   Gastrointestinal:  Positive for heartburn and nausea.  Genitourinary: Negative.   Musculoskeletal: Negative.   Skin: Negative.   Neurological: Negative.   Endo/Heme/Allergies: Negative.   Psychiatric/Behavioral:  The patient is nervous/anxious.         Medical History: Past Medical History      Past Medical History:  Diagnosis Date   Anxiety     Arthritis      Diabetes mellitus without complication (CMS-HCC)     GERD (gastroesophageal reflux disease)     Hypertension             Patient Active Problem List  Diagnosis   Anxiety   Calculus of gallbladder without cholecystitis without obstruction   Constipation   Essential (primary) hypertension   GERD (gastroesophageal reflux disease)   History of malignant neoplasm of uterine body   Spinal stenosis of cervical region   Type 2 diabetes mellitus (CMS-HCC)   Vitamin D deficiency      Past Surgical History       Past Surgical History:  Procedure Laterality Date   HYSTERECTOMY            Allergies      Allergies  Allergen Reactions   Amoxicillin Rash              Current Outpatient Medications on File Prior to Visit  Medication Sig Dispense Refill   benazepriL (LOTENSIN) 20 MG tablet Take 1 tablet every day by oral route.       clonazePAM (KLONOPIN) 1 MG tablet Take 1 tablet by mouth 4 (four) times daily       semaglutide (OZEMPIC) 0.25 mg or 0.5 mg(2 mg/1.5 mL) pen injector Inject subcutaneously        No current facility-administered medications on file prior to visit.      Family History       Family History  Problem  Relation Age of Onset   Hyperlipidemia (Elevated cholesterol) Mother     High blood pressure (Hypertension) Mother     Coronary Artery Disease (Blocked arteries around heart) Mother     Hyperlipidemia (Elevated cholesterol) Father     Obesity Father     High blood pressure (Hypertension) Father     Coronary Artery Disease (Blocked arteries around heart) Father     Skin cancer Sister     Hyperlipidemia (Elevated cholesterol) Brother     High blood pressure (Hypertension) Brother     Coronary Artery Disease (Blocked arteries around heart) Brother          Social History       Tobacco Use  Smoking Status Never  Smokeless Tobacco Never      Social History  Social History        Socioeconomic History   Marital status: Single  Tobacco Use    Smoking status: Never   Smokeless tobacco: Never  Vaping Use   Vaping Use: Never used  Substance and Sexual Activity   Alcohol use: Yes   Drug use: Never        Objective:          Vitals:    06/17/22 1347 06/17/22 1348  BP: (!) 150/80    Temp: 36.8 C (98.2 F)    SpO2: 99%    Weight: (!) 102.4 kg (225 lb 12.8 oz)    Height: 161.3 cm (5' 3.5")    PainSc:   0-No pain    Body mass index is 39.37 kg/m.   Physical Exam    Constitutional:  WDWN in NAD, conversant, no obvious deformities; lying in bed comfortably Eyes:  Pupils equal, round; sclera anicteric; moist conjunctiva; no lid lag HENT:  Oral mucosa moist; good dentition  Neck:  No masses palpated, trachea midline; no thyromegaly Lungs:  CTA bilaterally; normal respiratory effort CV:  Regular rate and rhythm; no murmurs; extremities well-perfused with no edema Abd:  +bowel sounds, soft, non-tender, no palpable organomegaly; no palpable hernias; healed periumbilical laparoscopic incision. Musc: Normal gait; no apparent clubbing or cyanosis in extremities Lymphatic:  No palpable cervical or axillary lymphadenopathy Skin:  Warm, dry; no sign of jaundice Psychiatric - alert and oriented x 4; calm mood and affect     Labs, Imaging and Diagnostic Testing: 06/05/22 ED labs   T. Bili 3.4 AST 361 ALT 354 Alk phos 137 Lipase 54   CLINICAL DATA:  Back pain vomiting   EXAM: CT ABDOMEN AND PELVIS WITH CONTRAST   TECHNIQUE: Multidetector CT imaging of the abdomen and pelvis was performed using the standard protocol following bolus administration of intravenous contrast.   RADIATION DOSE REDUCTION: This exam was performed according to the departmental dose-optimization program which includes automated exposure control, adjustment of the mA and/or kV according to patient size and/or use of iterative reconstruction technique.   CONTRAST:  51m OMNIPAQUE IOHEXOL 350 MG/ML SOLN   COMPARISON:  CT 03/20/2020    FINDINGS: Lower chest: Lung bases are clear   Hepatobiliary: Gallstones. Hepatic steatosis. No biliary dilatation or right upper quadrant inflammation   Pancreas: Unremarkable. No pancreatic ductal dilatation or surrounding inflammatory changes.   Spleen: Normal in size without focal abnormality.   Adrenals/Urinary Tract: Adrenal glands are within normal limits. No convincing hydronephrosis. The bladder is normal   Stomach/Bowel: Stomach is within normal limits. Appendix appears normal. No evidence of bowel wall thickening, distention, or inflammatory changes.   Vascular/Lymphatic: Mild aortic atherosclerosis. No aneurysm.  No suspicious lymph nodes.   Reproductive: Status post hysterectomy. No adnexal masses.   Other: Negative for pelvic effusion or free air. Small fat containing umbilical hernia   Musculoskeletal: No acute or significant osseous findings.   IMPRESSION: 1. No CT evidence for acute intra-abdominal or pelvic abnormality. 2. Hepatic steatosis. 3. Gallstones. 4. Aortic atherosclerosis.   Aortic Atherosclerosis (ICD10-I70.0).     Electronically Signed   By: Donavan Foil M.D.   On: 06/05/2022 20:24   Assessment and Plan:  Diagnoses and all orders for this visit:   Calculus of gallbladder with chronic cholecystitis without obstruction     Recommend laparoscopic cholecystectomy with intraoperative cholangiogram.  The surgical procedure has been discussed with the patient.  Potential risks, benefits, alternative treatments, and expected outcomes have been explained.  All of the patient's questions at this time have been answered.  The likelihood of reaching the patient's treatment goal is good.  The patient understand the proposed surgical procedure and wishes to proceed.     No follow-ups on file.   Clester Chlebowski Jearld Adjutant, MD  06/17/2022 2:09 PM

## 2022-06-20 ENCOUNTER — Other Ambulatory Visit: Payer: Self-pay

## 2022-06-20 ENCOUNTER — Other Ambulatory Visit: Payer: Self-pay | Admitting: Pharmacist

## 2022-06-20 ENCOUNTER — Other Ambulatory Visit (HOSPITAL_COMMUNITY): Payer: Self-pay

## 2022-06-20 ENCOUNTER — Other Ambulatory Visit (HOSPITAL_BASED_OUTPATIENT_CLINIC_OR_DEPARTMENT_OTHER): Payer: Self-pay

## 2022-06-20 MED ORDER — OZEMPIC (0.25 OR 0.5 MG/DOSE) 2 MG/3ML ~~LOC~~ SOPN
0.5000 mL | PEN_INJECTOR | SUBCUTANEOUS | 3 refills | Status: DC
  Filled 2022-06-20: qty 3, 28d supply, fill #0
  Filled 2022-07-22: qty 3, 28d supply, fill #1

## 2022-08-21 ENCOUNTER — Other Ambulatory Visit (HOSPITAL_COMMUNITY): Payer: Self-pay

## 2022-08-21 ENCOUNTER — Other Ambulatory Visit: Payer: Self-pay | Admitting: Surgery

## 2022-08-21 ENCOUNTER — Other Ambulatory Visit: Payer: Self-pay

## 2022-08-21 MED ORDER — OXYCODONE HCL 5 MG PO TABS
ORAL_TABLET | ORAL | 0 refills | Status: DC
  Filled 2022-08-21: qty 20, 5d supply, fill #0

## 2022-08-27 ENCOUNTER — Other Ambulatory Visit: Payer: Self-pay

## 2022-08-29 ENCOUNTER — Encounter: Payer: Self-pay | Admitting: Family Medicine

## 2022-08-30 ENCOUNTER — Other Ambulatory Visit: Payer: Self-pay | Admitting: Family Medicine

## 2022-08-30 DIAGNOSIS — E1169 Type 2 diabetes mellitus with other specified complication: Secondary | ICD-10-CM

## 2022-09-03 ENCOUNTER — Other Ambulatory Visit: Payer: Self-pay

## 2022-09-04 ENCOUNTER — Other Ambulatory Visit: Payer: Self-pay

## 2022-10-07 ENCOUNTER — Ambulatory Visit: Payer: Medicaid Other | Attending: Family Medicine | Admitting: Family Medicine

## 2022-10-07 VITALS — BP 112/76 | HR 68 | Ht 63.0 in | Wt 217.4 lb

## 2022-10-07 DIAGNOSIS — Z7985 Long-term (current) use of injectable non-insulin antidiabetic drugs: Secondary | ICD-10-CM | POA: Diagnosis not present

## 2022-10-07 DIAGNOSIS — R7989 Other specified abnormal findings of blood chemistry: Secondary | ICD-10-CM

## 2022-10-07 DIAGNOSIS — N393 Stress incontinence (female) (male): Secondary | ICD-10-CM

## 2022-10-07 DIAGNOSIS — E1169 Type 2 diabetes mellitus with other specified complication: Secondary | ICD-10-CM

## 2022-10-07 DIAGNOSIS — Z789 Other specified health status: Secondary | ICD-10-CM

## 2022-10-07 DIAGNOSIS — R21 Rash and other nonspecific skin eruption: Secondary | ICD-10-CM

## 2022-10-07 DIAGNOSIS — E1159 Type 2 diabetes mellitus with other circulatory complications: Secondary | ICD-10-CM

## 2022-10-07 DIAGNOSIS — E785 Hyperlipidemia, unspecified: Secondary | ICD-10-CM | POA: Diagnosis not present

## 2022-10-07 DIAGNOSIS — I152 Hypertension secondary to endocrine disorders: Secondary | ICD-10-CM

## 2022-10-07 LAB — POCT URINALYSIS DIP (CLINITEK)
Bilirubin, UA: NEGATIVE
Blood, UA: NEGATIVE
Glucose, UA: NEGATIVE mg/dL
Ketones, POC UA: NEGATIVE mg/dL
Leukocytes, UA: NEGATIVE
Nitrite, UA: NEGATIVE
Spec Grav, UA: >=1.03 — AB (ref 1.010–1.025)
Urobilinogen, UA: 0.2 E.U./dL
pH, UA: 6 (ref 5.0–8.0)

## 2022-10-07 LAB — POCT GLYCOSYLATED HEMOGLOBIN (HGB A1C): HbA1c, POC (controlled diabetic range): 5.7 % (ref 0.0–7.0)

## 2022-10-07 NOTE — Progress Notes (Signed)
Subjective:  Patient ID: Christina Burke, female    DOB: [birth year]  Age: 62 y.o. MRN: [id number]  CC: Diabetes   HPI Christina Burke is a 62 y.o. year old female with a history of hypertension, anxiety, hyperlipidemia history of endometrial CA confined to the uterus status post hysterectomy , esophageal dysphagia s/p esophageal dilatation, atherosclerotic cardiovascular disease, 2 diabetes mellitus, alcohol dependence   Interval History:  Since her cholecystectomy her appetite has returned to normal and her bowel movements are regular. She discontinued Ozempic prior to her cholecytectomy because she was having nausea and some digestive issues but she has not restarted it. Currently working on trying to eat right, decreasing alcohol intake for management of her diabetes mellitus.  She Complains of urinary incontinence mostly when she coughs and sneezes, and on straining and she is having to wear pantiliners for this.   She has noticed dark patches on her face and does have a family history of skin cancer hence she would want to see a dermatologist.  Last set of labs revealed significantly elevated LFTs including hyperbilirubinemia but that was prior to her cholecystectomy.  She also endorses drinking alcohol but is working on cutting back and had 5 drinks of beer over the weekend.  Past Medical History:  Diagnosis Date   Anxiety    Cancer (HCC)    pt. reports had uterine cancer x 10 years ago   Chest pain 12/08/2013   GERD (gastroesophageal reflux disease) 08/10/2014   Hyperlipidemia    Hypertension    Murmur 12/08/2013    Past Surgical History:  Procedure Laterality Date   ABDOMINAL HYSTERECTOMY     ABDOMINAL SURGERY     COLONOSCOPY     UPPER GASTROINTESTINAL ENDOSCOPY  4/21/216    Family History  Problem Relation Age of Onset   Gallbladder disease Mother    Heart disease Father    Lung cancer Father    Brain cancer Father    Colon polyps Sister    Diabetes Sister         x3   Diabetes Brother    Breast cancer Paternal Aunt    Colon cancer Neg Hx    Kidney disease Neg Hx    Esophageal cancer Neg Hx    Inflammatory bowel disease Neg Hx    Liver disease Neg Hx    Pancreatic cancer Neg Hx    Rectal cancer Neg Hx    Stomach cancer Neg Hx     Social History   Socioeconomic History   Marital status: Single    Spouse name: Not on file   Number of children: 2   Years of education: Not on file   Highest education level: Some college, no degree  Occupational History   Occupation: Unemployed  Tobacco Use   Smoking status: Never    Passive exposure: Never   Smokeless tobacco: Never  Vaping Use   Vaping Use: Never used  Substance and Sexual Activity   Alcohol use: Yes    Alcohol/week: 6.0 standard drinks of alcohol    Types: 6 Cans of beer per week   Drug use: No   Sexual activity: Yes    Birth control/protection: None  Other Topics Concern   Not on file  Social History Narrative   Right handed   Social Determinants of Health   Financial Resource Strain: Not on file  Food Insecurity: No Food Insecurity (01/08/2021)   Hunger Vital Sign    Worried About Running Out of  Food in the Last Year: Never true    Ran Out of Food in the Last Year: Never true  Transportation Needs: No Transportation Needs (01/08/2021)   PRAPARE - Administrator, Civil Service (Medical): No    Lack of Transportation (Non-Medical): No  Physical Activity: Not on file  Stress: Not on file  Social Connections: Not on file    Allergies  Allergen Reactions   Amoxicillin Rash    Has patient had a PCN reaction causing immediate rash, facial/tongue/throat swelling, SOB or lightheadedness with hypotension: yes Has patient had a PCN reaction causing severe rash involving mucus membranes or skin necrosis: no Has patient had a PCN reaction that required hospitalization: no Has patient had a PCN reaction occurring within the last 10 years: no If all of the above  answers are "NO", then may proceed with Cephalosporin use.    Penicillins Rash    Outpatient Medications Prior to Visit  Medication Sig Dispense Refill   atorvastatin (LIPITOR) 20 MG tablet Take 1 tablet (20 mg total) by mouth daily. 90 tablet 1   benazepril (LOTENSIN) 20 MG tablet Take 1 tablet (20 mg total) by mouth daily. 90 tablet 1   Blood Glucose Monitoring Suppl (TRUE METRIX METER) w/Device KIT use as directed daily before breakfast. 1 kit 0   clobetasol cream (TEMOVATE) 0.05 % Apply 1 application. topically 2 (two) times daily. Apply to the vulva for 4 weeks. 60 g 0   clonazePAM (KLONOPIN) 1 MG tablet Take 1 mg by mouth 2 (two) times daily.     DULoxetine (CYMBALTA) 60 MG capsule Take 1 capsule (60 mg total) by mouth 2 (two) times daily. For back pain 60 capsule 3   glucose blood (TRUE METRIX BLOOD GLUCOSE TEST) test strip Use daily 100 each 12   lidocaine (LIDODERM) 5 % Place 1 patch onto the skin daily. Remove & Discard patch within 12 hours or as directed by MD 30 patch 3   methocarbamol (ROBAXIN) 500 MG tablet Take 1 tablet (500 mg total) by mouth every 8 (eight) hours as needed for muscle spasms. 60 tablet 6   ondansetron (ZOFRAN-ODT) 4 MG disintegrating tablet Take 1 tablet (4 mg total) by mouth every 8 (eight) hours as needed for nausea or vomiting. 20 tablet 0   oxyCODONE (OXY IR/ROXICODONE) 5 MG immediate release tablet Take 1 tablet (5 mg total) by mouth every 6 (six) hours as needed 20 tablet 0   promethazine (PHENERGAN) 12.5 MG tablet Take 1 to 2 tabs daily every 4 to 6 hours if needed for nausea 20 tablet 0   Semaglutide,0.25 or 0.5MG /DOS, (OZEMPIC, 0.25 OR 0.5 MG/DOSE,) 2 MG/3ML SOPN Inject 0.5 mLs into the skin once a week. 3 mL 3   TRUEplus Lancets 28G MISC 1 each by Does not apply route 3 (three) times daily before meals. 100 each 12   esomeprazole (NEXIUM) 20 MG capsule TAKE 1 CAPSULE BY MOUTH DAILY BEFORE BREAKFAST. (Patient not taking: Reported on 06/20/2021) 30  capsule 2   No facility-administered medications prior to visit.     ROS Review of Systems  Constitutional:  Negative for activity change and appetite change.  HENT:  Negative for sinus pressure and sore throat.   Respiratory:  Negative for chest tightness, shortness of breath and wheezing.   Cardiovascular:  Negative for chest pain and palpitations.  Gastrointestinal:  Negative for abdominal distention, abdominal pain and constipation.  Genitourinary: Negative.   Musculoskeletal: Negative.   Skin:  Positive  for rash.  Psychiatric/Behavioral:  Negative for behavioral problems and dysphoric mood.     Objective:  BP 112/76   Pulse 68   Ht 5\' 3"  (1.6 m)   Wt 217 lb 6.4 oz (98.6 kg)   SpO2 96%   BMI 38.51 kg/m      10/07/2022    8:45 AM 06/05/2022   11:00 PM 06/05/2022   10:40 PM  BP/Weight  Systolic BP 112 136 158  Diastolic BP 76 67 81  Wt. (Lbs) 217.4    BMI 38.51 kg/m2      Wt Readings from Last 3 Encounters:  10/07/22 217 lb 6.4 oz (98.6 kg)  06/05/22 225 lb (102.1 kg)  05/05/22 229 lb 12.8 oz (104.2 kg)      Physical Exam Constitutional:      Appearance: She is well-developed. She is obese.  Cardiovascular:     Rate and Rhythm: Normal rate.     Heart sounds: Normal heart sounds. No murmur heard. Pulmonary:     Effort: Pulmonary effort is normal.     Breath sounds: Normal breath sounds. No wheezing or rales.  Chest:     Chest wall: No tenderness.  Abdominal:     General: Bowel sounds are normal. There is no distension.     Palpations: Abdomen is soft. There is no mass.     Tenderness: There is no abdominal tenderness.  Musculoskeletal:        General: Normal range of motion.     Right lower leg: No edema.     Left lower leg: No edema.  Skin:    Comments: Hyperpigmented patches on face  Neurological:     Mental Status: She is alert and oriented to person, place, and time.  Psychiatric:        Mood and Affect: Mood normal.        Latest Ref Rng &  Units 06/05/2022    4:13 PM 05/05/2022   12:00 PM 09/27/2021   11:50 AM  CMP  Glucose 70 - 99 mg/dL 045  409  811   BUN 8 - 23 mg/dL 10  10  13    Creatinine 0.44 - 1.00 mg/dL 9.14  7.82  9.56   Sodium 135 - 145 mmol/L 137  139  138   Potassium 3.5 - 5.1 mmol/L 4.3  4.4  4.6   Chloride 98 - 111 mmol/L 100  100  101   CO2 22 - 32 mmol/L 26  22  23    Calcium 8.9 - 10.3 mg/dL 21.3  9.4  9.5   Total Protein 6.5 - 8.1 g/dL 7.5  7.4  7.3   Total Bilirubin 0.3 - 1.2 mg/dL 3.4  0.3  0.3   Alkaline Phos 38 - 126 U/L 137  88  73   AST 15 - 41 U/L 361  22  27   ALT 0 - 44 U/L 354  25  32     Lipid Panel     Component Value Date/Time   CHOL 196 05/05/2022 1200   TRIG 222 (H) 05/05/2022 1200   HDL 39 (L) 05/05/2022 1200   CHOLHDL 4.3 01/28/2022 1612   CHOLHDL 3.9 04/08/2016 1025   VLDL 12 04/08/2016 1025   LDLCALC 118 (H) 05/05/2022 1200    CBC    Component Value Date/Time   WBC 6.0 06/05/2022 1613   RBC 4.59 06/05/2022 1613   HGB 13.9 06/05/2022 1613   HGB 13.2 06/20/2021 1132   HCT 41.9 06/05/2022  1613   HCT 39.5 06/20/2021 1132   PLT 308 06/05/2022 1613   PLT 268 06/20/2021 1132   MCV 91.3 06/05/2022 1613   MCV 93 06/20/2021 1132   MCH 30.3 06/05/2022 1613   MCHC 33.2 06/05/2022 1613   RDW 12.9 06/05/2022 1613   RDW 12.8 06/20/2021 1132   LYMPHSABS 1.6 06/20/2021 1132   MONOABS 0.3 03/01/2015 1600   EOSABS 0.1 06/20/2021 1132   BASOSABS 0.1 06/20/2021 1132    Lab Results  Component Value Date   HGBA1C 5.7 10/07/2022   Lab Results  Component Value Date   COLORU straw (A) 10/07/2022   CLARITYU clear 10/07/2022   GLUCOSEUR negative 10/07/2022   BILIRUBINUR negative 10/07/2022   SPECGRAV >=1.030 (A) 10/07/2022   RBCUR negative 10/07/2022   PHUR 6.0 10/07/2022   PROTEINUR NEGATIVE 06/05/2022   UROBILINOGEN 0.2 10/07/2022   LEUKOCYTESUR Negative 10/07/2022     Assessment & Plan:  1. Type 2 diabetes mellitus with other specified complication, without long-term  current use of insulin (HCC) Controlled with A1c of 5.7 She has not been taking Ozempic and is undecided about it She would like to know if she should restart Ozempic but she has been informed this is the decision she would need to make especially since she had complained of GI symptoms earlier in the year but again she was reminded GI symptoms were prior to her cholecystectomy and is unclear if GI symptoms were secondary to cholelithiasis and GLP-1 RA use. Counseled on Diabetic diet, my plate method, 161 minutes of moderate intensity exercise/week Blood sugar logs with fasting goals of 80-120 mg/dl, random of less than 096 and in the event of sugars less than 60 mg/dl or greater than 045 mg/dl encouraged to notify the clinic. Advised on the need for annual eye exams, annual foot exams, Pneumonia vaccine. - POCT glycosylated hemoglobin (Hb A1C) - Ambulatory referral to Ophthalmology  2. Urinary, incontinence, stress female UA negative for UTI We have discussed Kegel exercises and initiation of medications but she is inquiring about implantable devices that help with Kegel exercises. I will refer her to urogynecology to discuss this further. - Ambulatory referral to Urogynecology - POCT URINALYSIS DIP (CLINITEK)  3. Rash Family history of skin cancer - Ambulatory referral to Dermatology  4. Elevated LFTs This was in the setting of cholelithiasis coupled with alcohol use - CMP14+EGFR - Gamma GT  5. Alcohol use See #4 above  6. Hypertension associated with diabetes (HCC) Controlled Continue antihypertensive Counseled on blood pressure goal of less than 130/80, low-sodium, DASH diet, medication compliance, 150 minutes of moderate intensity exercise per week. Discussed medication compliance, adverse effects.  7. Hyperlipidemia associated with type 2 diabetes mellitus (HCC) Stable Low-cholesterol diet    No orders of the defined types were placed in this encounter.   Follow-up:  Return in about 3 months (around 01/07/2023) for Chronic medical conditions.       Hoy Register, MD, FAAFP. [facility] [place], [place] [phone]   10/07/2022, 1:17 PM

## 2022-10-07 NOTE — Patient Instructions (Signed)

## 2022-10-07 NOTE — Progress Notes (Signed)
Urine incontinence Dry spots on face.

## 2022-10-08 LAB — GAMMA GT: GGT: 18 IU/L (ref 0–60)

## 2022-10-08 LAB — CMP14+EGFR
ALT: 24 IU/L (ref 0–32)
AST: 19 IU/L (ref 0–40)
Albumin/Globulin Ratio: 1.8
Albumin: 4.5 g/dL (ref 3.9–4.9)
Alkaline Phosphatase: 89 IU/L (ref 44–121)
BUN/Creatinine Ratio: 14 (ref 12–28)
BUN: 14 mg/dL (ref 8–27)
Bilirubin Total: 0.4 mg/dL (ref 0.0–1.2)
CO2: 22 mmol/L (ref 20–29)
Calcium: 9.4 mg/dL (ref 8.7–10.3)
Chloride: 101 mmol/L (ref 96–106)
Creatinine, Ser: 1.02 mg/dL — ABNORMAL HIGH (ref 0.57–1.00)
Globulin, Total: 2.5 g/dL (ref 1.5–4.5)
Glucose: 111 mg/dL — ABNORMAL HIGH (ref 70–99)
Potassium: 4.5 mmol/L (ref 3.5–5.2)
Sodium: 138 mmol/L (ref 134–144)
Total Protein: 7 g/dL (ref 6.0–8.5)
eGFR: 63 mL/min/{1.73_m2} (ref >59)

## 2022-10-09 ENCOUNTER — Encounter: Payer: Self-pay | Admitting: Obstetrics and Gynecology

## 2022-10-09 ENCOUNTER — Ambulatory Visit (INDEPENDENT_AMBULATORY_CARE_PROVIDER_SITE_OTHER): Payer: Medicaid Other | Admitting: Obstetrics and Gynecology

## 2022-10-09 VITALS — BP 123/85 | HR 77

## 2022-10-09 DIAGNOSIS — N393 Stress incontinence (female) (male): Secondary | ICD-10-CM

## 2022-10-09 DIAGNOSIS — N952 Postmenopausal atrophic vaginitis: Secondary | ICD-10-CM | POA: Diagnosis not present

## 2022-10-09 DIAGNOSIS — N8110 Cystocele, unspecified: Secondary | ICD-10-CM

## 2022-10-09 DIAGNOSIS — R350 Frequency of micturition: Secondary | ICD-10-CM

## 2022-10-09 LAB — POCT URINALYSIS DIPSTICK
Bilirubin, UA: NEGATIVE
Blood, UA: NEGATIVE
Glucose, UA: NEGATIVE
Ketones, UA: NEGATIVE
Leukocytes, UA: NEGATIVE
Nitrite, UA: NEGATIVE
Protein, UA: NEGATIVE
Spec Grav, UA: >=1.03 — AB (ref 1.010–1.025)
Urobilinogen, UA: 0.2 E.U./dL
pH, UA: 5.5 (ref 5.0–8.0)

## 2022-10-09 NOTE — Patient Instructions (Addendum)
Please consider the options for Stress incontinence   You have stage 1 out of 4 anterior vaginal wall prolapse.   Use coconut oil in the vagina 2-3 times a week.

## 2022-10-09 NOTE — Progress Notes (Signed)
[facility] New Patient Evaluation and Consultation  Referring Provider: Hoy Register, MD PCP: Hoy Register, MD Date of Service: 10/09/2022  SUBJECTIVE Chief Complaint: New Patient (Initial Visit) Christina Burke is a 62 y.o. female is here incontinence.)  History of Present Illness: Christina Burke is a 62 y.o. White or Caucasian female seen in consultation at the request of Dr. Alvis Lemmings for evaluation of Incontinence.  SUI> UUI, especially with cough. Leaks a trickle coming down.   Review of records significant for: Last A1c 5.7   Urinary Symptoms: Leaks urine with cough/ sneeze, lifting, and going from sitting to standing Leaks 3 time(s) per days.  Pad use: 3 liners/ mini-pads per day.   She is bothered by her UI symptoms.  Day time voids 8.  Nocturia: 1 times per night to void. Voiding dysfunction: she empties her bladder well.  does not use a catheter to empty bladder.  When urinating, she feels a weak stream Drinks: 24 oz Sugar Free Tea, 16 oz Water, and  Diet Dr. Reino Kent occasionally per day  UTIs: 0 UTI's in the last year.   Denies history of blood in urine, kidney or bladder stones, pyelonephritis, bladder cancer, and kidney cancer  Pelvic Organ Prolapse Symptoms:                  She Denies a feeling of a bulge the vaginal area.   Bowel Symptom: Bowel movements: 1 time(s) per day Stool consistency: soft  Straining: no.  Splinting: no.  Incomplete evacuation: no.  She Denies accidental bowel leakage / fecal incontinence Bowel regimen: none Last colonoscopy: Date 2022, Results pre-cancerous polyps removed  Sexual Function Sexually active: no.  Sexual orientation: Straight Pain with sex: No  Pelvic Pain Admits to pelvic pain Location: Right side in pelvis Pain occurs: right side when moving and changing position Prior pain treatment: None Improved by: Not changing positions Worsened by: Position change   Past Medical History:  Past  Medical History:  Diagnosis Date   Anxiety    Cancer (HCC)    pt. reports had uterine cancer x 10 years ago   Chest pain 12/08/2013   Gallbladder stone with nonacute cholecystitis    GERD (gastroesophageal reflux disease) 08/10/2014   Hyperlipidemia    Hypertension    Murmur 12/08/2013     Past Surgical History:   Past Surgical History:  Procedure Laterality Date   ABDOMINAL HYSTERECTOMY     ABDOMINAL SURGERY     COLONOSCOPY     UPPER GASTROINTESTINAL ENDOSCOPY  4/21/216     Past OB/GYN History: G3 P2 Vaginal deliveries: 2,  Forceps/ Vacuum deliveries: 0, Cesarean section: 0 Menopausal: Yes, at age 36's Last pap smear was 2022.  Any history of abnormal pap smears: yes.   Medications: She has a current medication list which includes the following prescription(s): atorvastatin, benazepril, true metrix meter, clobetasol cream, clonazepam, duloxetine, true metrix blood glucose test, trueplus lancets 28g, esomeprazole, ondansetron, promethazine, and ozempic (0.25 or 0.5 mg/dose).   Allergies: Patient is allergic to amoxicillin and penicillins.   Social History:  Social History   Tobacco Use   Smoking status: Never    Passive exposure: Never   Smokeless tobacco: Never  Vaping Use   Vaping Use: Never used  Substance Use Topics   Alcohol use: Yes    Alcohol/week: 6.0 standard drinks of alcohol    Types: 6 Cans of beer per week   Drug use: No    Relationship status: divorced  She lives with Son.   She is not employed. Regular exercise: No History of abuse: No  Family History:   Family History  Problem Relation Age of Onset   Gallbladder disease Mother    Heart disease Father    Lung cancer Father    Brain cancer Father    Colon polyps Sister    Skin cancer Sister    Diabetes Sister        x3   Diabetes Brother    Breast cancer Paternal Aunt    Colon cancer Neg Hx    Kidney disease Neg Hx    Esophageal cancer Neg Hx    Inflammatory bowel disease Neg Hx     Liver disease Neg Hx    Pancreatic cancer Neg Hx    Rectal cancer Neg Hx    Stomach cancer Neg Hx      Review of Systems: Review of Systems  Constitutional:  Negative for fever, malaise/fatigue and weight loss.  Respiratory:  Positive for shortness of breath.   Cardiovascular:  Positive for palpitations. Negative for chest pain and leg swelling.  Gastrointestinal:  Negative for abdominal pain, blood in stool and constipation.  Genitourinary:  Negative for flank pain, hematuria and urgency.  Skin:  Negative for rash.  Neurological:  Negative for dizziness, weakness and headaches.  Endo/Heme/Allergies:  Does not bruise/bleed easily.  Psychiatric/Behavioral:  Negative for depression and suicidal ideas. The patient is nervous/anxious.      OBJECTIVE Physical Exam: Vitals:   10/09/22 1315  BP: 123/85  Pulse: 77    Physical Exam Constitutional:      Appearance: Normal appearance.  Pulmonary:     Effort: Pulmonary effort is normal.  Abdominal:     General: Abdomen is flat.  Genitourinary:    General: Normal vulva.  Skin:    General: Skin is warm and dry.  Neurological:     Mental Status: She is alert and oriented to person, place, and time.  Psychiatric:        Mood and Affect: Mood normal.        Behavior: Behavior normal.        Thought Content: Thought content normal.      GU / Detailed Urogynecologic Evaluation:  Pelvic Exam: Normal external female genitalia; Bartholin's and Skene's glands normal in appearance; urethral meatus normal in appearance, no urethral masses or discharge.   CST: negative  s/p hysterectomy: Speculum exam reveals normal vaginal mucosa with  atrophy and normal vaginal cuff.  Adnexa normal adnexa.    With apex supported, anterior compartment defect was present  Pelvic floor strength I/V,  Pelvic floor musculature: Right levator tender, Right obturator non-tender, Left levator tender, Left obturator non-tender  POP-Q:    POP-Q  -1.5                                            Aa   -1.5                                           Ba  -7  C   4                                            Gh  3.5                                            Pb  8.5                                            tvl   -2                                            Ap  -2                                            Bp                                                 D      Rectal Exam:  Normal external exam  Post-Void Residual (PVR) by Bladder Scan: In order to evaluate bladder emptying, we discussed obtaining a postvoid residual and she agreed to this procedure.  Procedure: The ultrasound unit was placed on the patient's abdomen in the suprapubic region after the patient had voided. A PVR of 0 ml was obtained by bladder scan.  Laboratory Results: POC Urine: Negative for all components.  ASSESSMENT AND PLAN Ms. Soon is a 62 y.o. with:  1. SUI (stress urinary incontinence, female)   2. Prolapse of anterior vaginal wall   3. Urinary frequency   4. Vaginal atrophy    Patient's symptoms are consistent with Stress incontinence and she denies any urge incontinence. For treatment of SUI we discussed options including Pelvic floor PT, Pessary, Sling, or urethral bulking. She is leaning towards doing a sling as she wants something more permanent that does not have to be maintained. I cautioned her that with her anxiety that sometimes patients with poorly controlled anxiety and depression have difficulties with mesh. She will need a simple CMG to confirm leakage and would like her to meet with Dr. Florian Buff to discuss sling. Patient would like to start with some physical therapy while awaiting to meet with Dr. Florian Buff. Referral placed for PT.  Patient is mostly asymptomatic of prolapse, but we discussed she does have some laxity to the anterior vaginal wall that may need to be  addressed if she is planning to have a sling. We briefly discussed an anterior repair but patient seemed a little overwhelmed with all the options for treatment. She will discuss more in depth with Dr. Florian Buff if anterior repair is needed with sling.  Patient has no real issues with urgency, no signs of acute UTI. Will focus on SUI.  Patient has mild vaginal atrophy and had  discomfort with pelvic exam. Encouraged her to start using lubrication, vitamin E or coconut oil for her vaginal dryness. May need estrogen cream if she decides she would like surgery.   Patient to follow up in 8 weeks or sooner if needed.    Selmer Dominion, NP

## 2022-11-03 ENCOUNTER — Ambulatory Visit: Payer: Medicaid Other | Admitting: Family Medicine

## 2022-11-04 ENCOUNTER — Encounter: Payer: Medicaid Other | Attending: Family Medicine | Admitting: Skilled Nursing Facility1

## 2022-11-04 ENCOUNTER — Encounter: Payer: Self-pay | Admitting: Skilled Nursing Facility1

## 2022-11-04 VITALS — Ht 63.5 in | Wt 217.0 lb

## 2022-11-04 DIAGNOSIS — E1169 Type 2 diabetes mellitus with other specified complication: Secondary | ICD-10-CM | POA: Diagnosis present

## 2022-11-04 NOTE — Progress Notes (Signed)
Other Dx: Anxiety Cancer GERD Heart murmur Hyperlipidemia HTN  A1C 5.7 Creatinine 1.02 AST 361 ALT 354   Pt states after drinking too many diet dr peppers she believes she got to a bad place such has having her gallbladder removed and having a lot of indigestion. Pt states she was taking Ozempic at this time as well so has stopped taking Ozempic.  Pt states she thinks she was able to get her A1C down with cutting her portions down.  Pt states when she eats out she gives her son half of her food.  Pt states she eats out once a week.  Pt states she sleeps about 4 hours or 2 hours so it is interrupted. Pt states she goes to bed around 1am.

## 2022-11-08 ENCOUNTER — Encounter (HOSPITAL_COMMUNITY): Payer: Self-pay

## 2022-11-08 ENCOUNTER — Ambulatory Visit (HOSPITAL_COMMUNITY)
Admission: EM | Admit: 2022-11-08 | Discharge: 2022-11-08 | Disposition: A | Discharge status: Home / Self Care | Payer: Medicaid Other | Attending: Emergency Medicine | Admitting: Emergency Medicine

## 2022-11-08 DIAGNOSIS — H0014 Chalazion left upper eyelid: Secondary | ICD-10-CM | POA: Diagnosis not present

## 2022-11-08 DIAGNOSIS — H1032 Unspecified acute conjunctivitis, left eye: Secondary | ICD-10-CM

## 2022-11-08 MED ORDER — MOXIFLOXACIN HCL 0.5 % OP SOLN: 1.0000 [drp] | Freq: Three times a day (TID) | OPHTHALMIC | 0 refills | Status: AC

## 2022-11-08 NOTE — Discharge Instructions (Signed)
Apply warm compress to eyelid for 10-15 minutes at a time, several times daily Gentle massage of the lid to help open up any blocked gland  I am also treating you for conjunctivitis with eye drops. Use 3x daily for then ext 5-6 days

## 2022-11-08 NOTE — ED Triage Notes (Signed)
Patient here today with c/o left eye swelling and pain X 2-3 days ago after applying glitter eye shadow on and it getting in her eye.

## 2022-11-08 NOTE — ED Provider Notes (Signed)
[facility]    CSN: [id number] Arrival date & time: 11/08/22  1314      History   Chief Complaint Chief Complaint  Patient presents with   Eye Problem    HPI Christina Burke is a 62 y.o. female.  2 days ago she noticed a bump on upper left eyelid. Was concerned about swelling and the area was tender. Also has some left eye irritation. Maybe a little drainage and crusting this morning. No vision changes. No eye pain. No injury  She wonders if related to the new eye shadow she had applied before symptoms started  Past Medical History:  Diagnosis Date   Anxiety    Cancer (HCC)    pt. 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   Calculus of gallbladder without cholecystitis without obstruction 07/03/2020   Elevated lipase 07/03/2020   Intermittent diarrhea 07/03/2020   Dysphagia 02/19/2020   Schatzki's ring 02/19/2020   Right flank pain 02/19/2020   Constipation 02/19/2020   Family history of colonic polyps 02/19/2020   Spinal stenosis of cervical region 10/18/2019   Other spondylosis with radiculopathy, cervical region 08/09/2019   Vitamin D deficiency 03/23/2018   Hypertension    Anxiety    Hyperlipidemia    GERD (gastroesophageal reflux disease) 08/10/2014   HTN (hypertension) 01/25/2014   Chest pain 12/08/2013   Murmur 12/08/2013    Past Surgical History:  Procedure Laterality Date   ABDOMINAL HYSTERECTOMY     ABDOMINAL SURGERY     COLONOSCOPY     UPPER GASTROINTESTINAL ENDOSCOPY  4/21/216    OB History     Gravida  3   Para      Term      Preterm      AB  1   Living  2       SAB  1   IAB      Ectopic      Multiple      Live Births  2            Home Medications    Prior to Admission medications   Medication Sig Start Date End Date Taking? Authorizing Provider  benazepril (LOTENSIN) 20 MG tablet Take 1 tablet (20 mg total) by mouth daily. 05/05/22  Yes Newlin, Odette Horns, MD  clonazePAM (KLONOPIN) 1 MG tablet Take 1 mg by mouth 2 (two) times daily.   Yes [provider]  moxifloxacin (VIGAMOX) 0.5 % ophthalmic solution Place 1 drop into the left eye 3 (three) times daily. 11/08/22  Yes Klaire Court, Lurena Joiner, PA-C  atorvastatin (LIPITOR) 20 MG tablet Take 1 tablet (20 mg total) by mouth daily. 05/05/22   Hoy Register, MD  Blood Glucose Monitoring Suppl (TRUE METRIX METER) w/Device KIT use as directed daily before breakfast. 09/27/21   Hoy Register, MD  clobetasol cream (TEMOVATE) 0.05 % Apply 1 application. topically 2 (two) times daily. Apply to the vulva for 4 weeks. 09/27/21   Hoy Register, MD  DULoxetine (CYMBALTA) 60 MG capsule Take 1 capsule (60 mg total) by mouth 2 (two) times daily. For back pain 06/28/19   Raulkar, Drema Pry,  MD  glucose blood (TRUE METRIX BLOOD GLUCOSE TEST) test strip Use daily 09/27/21   Hoy Register, MD  TRUEplus Lancets 28G MISC 1 each by Does not apply route 3 (three) times daily before meals. 09/27/21   Hoy Register, MD    Family History Family History  Problem Relation Age of Onset   Gallbladder disease Mother    Heart disease Father    Lung cancer Father    Brain cancer Father    Colon polyps Sister    Skin cancer Sister    Diabetes Sister        x3   Diabetes Brother    Breast cancer Paternal Aunt    Colon cancer Neg Hx    Kidney disease Neg Hx    Esophageal cancer Neg Hx    Inflammatory bowel disease Neg Hx    Liver disease Neg Hx    Pancreatic cancer Neg Hx    Rectal cancer Neg Hx    Stomach cancer Neg Hx     Social History Social History   Tobacco Use   Smoking status: Never     Passive exposure: Never   Smokeless tobacco: Never  Vaping Use   Vaping status: Never Used  Substance Use Topics   Alcohol use: Yes    Alcohol/week: 6.0 standard drinks of alcohol    Types: 6 Cans of beer per week   Drug use: No     Allergies   Amoxicillin   Review of Systems Review of Systems Per HPI  Physical Exam Triage Vital Signs ED Triage Vitals  Encounter Vitals Group     BP 11/08/22 1348 137/79     Systolic BP Percentile --      Diastolic BP Percentile --      Pulse Rate 11/08/22 1348 86     Resp 11/08/22 1348 16     Temp 11/08/22 1348 98.2 F (36.8 C)     Temp Source 11/08/22 1348 Oral     SpO2 11/08/22 1348 96 %     Weight 11/08/22 1348 213 lb (96.6 kg)     Height 11/08/22 1348 5' 3.5" (1.613 m)     Head Circumference --      Peak Flow --      Pain Score 11/08/22 1347 7     Pain Loc --      Pain Education --      Exclude from Growth Chart --    No data found.  Updated Vital Signs BP 137/79 (BP Location: Right Arm)   Pulse 86   Temp 98.2 F (36.8 C) (Oral)   Resp 16   Ht 5' 3.5" (1.613 m)   Wt 213 lb (96.6 kg)   SpO2 96%   BMI 37.14 kg/m   Physical Exam Vitals and nursing note reviewed.  Constitutional:      General: She is not in acute distress.    Appearance: Normal appearance.  HENT:     Head: Atraumatic.     Mouth/Throat:     Mouth: Mucous membranes are moist.     Pharynx: Oropharynx is clear.  Eyes:     General: Lids are everted, no foreign bodies appreciated. Vision grossly intact. Gaze aligned appropriately.        Left eye: No foreign body or hordeolum.     Extraocular Movements: Extraocular movements intact.     Conjunctiva/sclera: Conjunctivae normal.     Left eye: Left conjunctiva is not injected.     Pupils: Pupils are  equal, round, and reactive to light.      Comments: Small area on upper eyelid is erythematous and tender. Not on lash line. No drainage. Consistent with chalazion. Conjunctiva are normal, vision intact, no  pain with EOM. There is scant yellow discharge in the left lower lashes, some crusting at corner  Cardiovascular:     Rate and Rhythm: Normal rate and regular rhythm.     Heart sounds: Normal heart sounds.  Pulmonary:     Effort: Pulmonary effort is normal.     Breath sounds: Normal breath sounds.  Musculoskeletal:     Cervical back: Normal range of motion.  Neurological:     Mental Status: She is alert and oriented to person, place, and time.     UC Treatments / Results  Labs (all labs ordered are listed, but only abnormal results are displayed) Labs Reviewed - No data to display  EKG  Radiology No results found.  Procedures Procedures   Medications Ordered in UC Medications - No data to display  Initial Impression / Assessment and Plan / UC Course  I have reviewed the triage vital signs and the nursing notes.  Pertinent labs & imaging results that were available during my care of the patient were reviewed by me and considered in my medical decision making (see chart for details).  Discussed with patient I suspect blocked oil gland or similar, likely chalazion. She is concerned she needs oral antibiotics. Discussed no signs of preseptal cellulitis or other skin infection and oral abx not indicated at this time. However with the scant discharge in lashes will cover for bacterial conjunctivitis with vigamox drops TID. Advised warm compress and gentle massage for chalazion. Can return if any concerns. Patient agreeable to plan, all questions answered  Final Clinical Impressions(s) / UC Diagnoses   Final diagnoses:  Chalazion left upper eyelid  Acute bacterial conjunctivitis of left eye     Discharge Instructions      Apply warm compress to eyelid for 10-15 minutes at a time, several times daily Gentle massage of the lid to help open up any blocked gland  I am also treating you for conjunctivitis with eye drops. Use 3x daily for then ext 5-6 days     ED  Prescriptions     Medication Sig Dispense Auth. Provider   moxifloxacin (VIGAMOX) 0.5 % ophthalmic solution Place 1 drop into the left eye 3 (three) times daily. 3 mL Maurio Baize, Lurena Joiner, PA-C      PDMP not reviewed this encounter.   Ikechukwu Cerny, Lurena Joiner, PA-C 11/08/22 1500

## 2022-12-05 ENCOUNTER — Encounter: Payer: Self-pay | Admitting: Obstetrics and Gynecology

## 2022-12-05 ENCOUNTER — Other Ambulatory Visit: Payer: Self-pay

## 2022-12-05 ENCOUNTER — Ambulatory Visit (INDEPENDENT_AMBULATORY_CARE_PROVIDER_SITE_OTHER): Payer: Medicaid Other | Admitting: Obstetrics and Gynecology

## 2022-12-05 VITALS — BP 122/77 | HR 96

## 2022-12-05 DIAGNOSIS — N393 Stress incontinence (female) (male): Secondary | ICD-10-CM | POA: Diagnosis not present

## 2022-12-05 NOTE — Progress Notes (Signed)
[facility] Return Visit  SUBJECTIVE  History of Present Illness: Christina Burke is a 62 y.o. female seen in follow-up for incontinence, SUI. Has some urinary frequency but no urge incontience. She maybe is interested in surgery for her SUI but she has also made an appt with pelvic PT which will start later this month. She was noted to have mild asymptomatic anterior vaginal prolapse.   Past Medical History: Patient  has a past medical history of Anxiety, Cancer (HCC), Chest pain (12/08/2013), Gallbladder stone with nonacute cholecystitis, GERD (gastroesophageal reflux disease) (08/10/2014), Hyperlipidemia, Hypertension, and Murmur (12/08/2013).   Past Surgical History: She  has a past surgical history that includes Abdominal surgery; Abdominal hysterectomy; Upper gastrointestinal endoscopy (4/21/216); and Colonoscopy.   Medications: She has a current medication list which includes the following prescription(s): atorvastatin, benazepril, true metrix meter, clobetasol cream, clonazepam, duloxetine, true metrix blood glucose test, moxifloxacin, and trueplus lancets 28g.   Allergies: Patient is allergic to amoxicillin.   Social History: Patient  reports that she has never smoked. She has never been exposed to tobacco smoke. She has never used smokeless tobacco. She reports current alcohol use of about 6.0 standard drinks of alcohol per week. She reports that she does not use drugs.      OBJECTIVE     Physical Exam: Vitals:   12/05/22 1430  BP: 122/77  Pulse: 96   Gen: No apparent distress, A&O x 3.  Detailed Urogynecologic Evaluation:  Normal external genitalia. On speculum, normal vaginal mucosa. On bimanual, no masses present.   Prior exam showed:  POP-Q  -2                                            Aa   -2                                           Ba  -9                                              C   4.5                                             Gh  3                                            Pb  9.5                                            tvl   -2                                            Ap  -  2                                            Bp                                                 D     Verbal consent was obtained to perform simple CMG procedure:   Prolapse was reduced using 2 large cotton swabs. Urethra was prepped with betadine and a 79F catheter was placed and bladder was drained completely. The bladder was then backfilled with sterile water by gravity.  First sensation: First Desire: Strong Desire: Capacity: Cough stress test was positive in the standing position. Valsalva stress test was negative.  She was was allowed to void on her own- voided .   Interpretation: CMG showed normal sensation, and normal cystometric capacity. Findings positive for stress incontinence, negative for detrusor overactivity.     ASSESSMENT AND PLAN    Christina Burke is a 62 y.o. with:  1. SUI (stress urinary incontinence, female)    - No significant prolapse noted today, so will defer treatment.  - For SUI, we discussed options of pelvic PT, urethral bulking or a sling. Handouts provided on surgical options for her to review. She prefers to go through pelvic PT first, then if her symptoms do not improve then she will consider a procedure  All questions answered  Marguerita Beards, MD  Time spent: I spent 20 minutes dedicated to the care of this patient on the date of this encounter to include pre-visit review of records, face-to-face time with the patient discussing treatment and post visit documentation. Additional time was spent on the procedure.

## 2022-12-22 ENCOUNTER — Ambulatory Visit: Payer: Medicaid Other

## 2023-01-12 ENCOUNTER — Encounter: Payer: Self-pay | Admitting: Family Medicine

## 2023-01-12 ENCOUNTER — Other Ambulatory Visit: Payer: Self-pay

## 2023-01-12 ENCOUNTER — Ambulatory Visit: Payer: Medicaid Other | Attending: Family Medicine | Admitting: Family Medicine

## 2023-01-12 ENCOUNTER — Other Ambulatory Visit: Payer: Self-pay | Admitting: Pharmacist

## 2023-01-12 VITALS — BP 105/72 | HR 81 | Ht 63.5 in | Wt 223.6 lb

## 2023-01-12 DIAGNOSIS — E1169 Type 2 diabetes mellitus with other specified complication: Secondary | ICD-10-CM

## 2023-01-12 DIAGNOSIS — I10 Essential (primary) hypertension: Secondary | ICD-10-CM | POA: Diagnosis not present

## 2023-01-12 DIAGNOSIS — Z1231 Encounter for screening mammogram for malignant neoplasm of breast: Secondary | ICD-10-CM

## 2023-01-12 DIAGNOSIS — E668 Other obesity: Secondary | ICD-10-CM | POA: Diagnosis not present

## 2023-01-12 DIAGNOSIS — M549 Dorsalgia, unspecified: Secondary | ICD-10-CM | POA: Diagnosis not present

## 2023-01-12 MED ORDER — BENAZEPRIL HCL 20 MG PO TABS
20.0000 mg | ORAL_TABLET | Freq: Every day | ORAL | 1 refills | Status: DC
  Filled 2023-01-12 – 2023-03-02 (×2): qty 90, 90d supply, fill #0
  Filled 2023-06-04: qty 90, 90d supply, fill #1

## 2023-01-12 MED ORDER — ACCU-CHEK GUIDE VI STRP
ORAL_STRIP | 0 refills | Status: DC
  Filled 2023-01-12: qty 100, 90d supply, fill #0
  Filled 2023-02-05: qty 50, 50d supply, fill #0

## 2023-01-12 MED ORDER — TRUEPLUS LANCETS 28G MISC
1.0000 | Freq: Three times a day (TID) | 12 refills | Status: DC
  Filled 2023-01-12: qty 100, 34d supply, fill #0

## 2023-01-12 MED ORDER — TRUE METRIX BLOOD GLUCOSE TEST VI STRP
ORAL_STRIP | 12 refills | Status: DC
  Filled 2023-01-12: qty 100, fill #0

## 2023-01-12 MED ORDER — ACCU-CHEK GUIDE W/DEVICE KIT
PACK | 0 refills | Status: AC
  Filled 2023-01-12: qty 1, 1d supply, fill #0
  Filled 2023-02-05: qty 1, 30d supply, fill #0

## 2023-01-12 MED ORDER — ACCU-CHEK SOFTCLIX LANCETS MISC
0 refills | Status: DC
  Filled 2023-01-12: qty 100, 90d supply, fill #0
  Filled 2023-02-05: qty 100, 100d supply, fill #0

## 2023-01-12 NOTE — Patient Instructions (Signed)

## 2023-01-12 NOTE — Progress Notes (Signed)
Subjective:  Patient ID: Christina Burke, female    DOB: [birth year]  Age: 62 y.o. MRN: [id number]  CC: Medical Management of Chronic Issues (Back pain)   HPI Christina Burke is a 62 y.o. year old female with a history of hypertension, anxiety, hyperlipidemia history of endometrial CA confined to the uterus status post hysterectomy , esophageal dysphagia s/p esophageal dilatation, atherosclerotic cardiovascular disease, Type 2 diabetes mellitus, alcohol dependence   Interval History: Discussed the use of AI scribe software for clinical note transcription with the patient, who gave verbal consent to proceed.  History of Present Illness   The patient, with a history of diabetes and hypertension, presents with intermittent back pain. The pain is located in the upper back, neck, and lower back, and radiates down the right leg. The intensity of the pain varies, reaching up to a 7 on a scale of 1 to 10. The patient has noticed that certain positions, such as sitting on the couch in a certain way, can exacerbate the pain. The patient has been managing the pain with ibuprofen and Tylenol, which seem to provide relief.  The patient also reports a recent weight gain of about ten pounds. She has seen a dietician but has not yet implemented the recommended dietary changes. The patient admits to an increased intake of sweets recently.  The patient's blood pressure is well-controlled on benazepril. The patient's last A1c was 5.7, and she is not currently on any medication for diabetes.  The patient also mentions a recent episode of COVID, which she managed with rest and hydration.        Past Medical History:  Diagnosis Date   Anxiety    Cancer (HCC)    pt. reports had uterine cancer x 10 years ago   Chest pain 12/08/2013   Gallbladder stone with nonacute cholecystitis    GERD (gastroesophageal reflux disease) 08/10/2014   Hyperlipidemia    Hypertension    Murmur 12/08/2013    Past  Surgical History:  Procedure Laterality Date   ABDOMINAL HYSTERECTOMY     ABDOMINAL SURGERY     COLONOSCOPY     UPPER GASTROINTESTINAL ENDOSCOPY  4/21/216    Family History  Problem Relation Age of Onset   Gallbladder disease Mother    Heart disease Father    Lung cancer Father    Brain cancer Father    Colon polyps Sister    Skin cancer Sister    Diabetes Sister        x3   Diabetes Brother    Breast cancer Paternal Aunt    Colon cancer Neg Hx    Kidney disease Neg Hx    Esophageal cancer Neg Hx    Inflammatory bowel disease Neg Hx    Liver disease Neg Hx    Pancreatic cancer Neg Hx    Rectal cancer Neg Hx    Stomach cancer Neg Hx     Social History   Socioeconomic History   Marital status: Single    Spouse name: Not on file   Number of children: 2   Years of education: Not on file   Highest education level: Some college, no degree  Occupational History   Occupation: Unemployed  Tobacco Use   Smoking status: Never    Passive exposure: Never   Smokeless tobacco: Never  Vaping Use   Vaping status: Never Used  Substance and Sexual Activity   Alcohol use: Yes    Alcohol/week: 6.0 standard drinks of alcohol  Types: 6 Cans of beer per week   Drug use: No   Sexual activity: Not Currently    Birth control/protection: None  Other Topics Concern   Not on file  Social History Narrative   Right handed   Social Determinants of Health   Financial Resource Strain: Not on file  Food Insecurity: No Food Insecurity (01/08/2021)   Hunger Vital Sign    Worried About Running Out of Food in the Last Year: Never true    Ran Out of Food in the Last Year: Never true  Transportation Needs: No Transportation Needs (01/08/2021)   PRAPARE - Administrator, Civil Service (Medical): No    Lack of Transportation (Non-Medical): No  Physical Activity: Not on file  Stress: Not on file  Social Connections: Not on file    Allergies  Allergen Reactions   Amoxicillin  Rash    Has patient had a PCN reaction causing immediate rash, facial/tongue/throat swelling, SOB or lightheadedness with hypotension: yes Has patient had a PCN reaction causing severe rash involving mucus membranes or skin necrosis: no Has patient had a PCN reaction that required hospitalization: no Has patient had a PCN reaction occurring within the last 10 years: no If all of the above answers are "NO", then may proceed with Cephalosporin use.     Outpatient Medications Prior to Visit  Medication Sig Dispense Refill   atorvastatin (LIPITOR) 20 MG tablet Take 1 tablet (20 mg total) by mouth daily. 90 tablet 1   clobetasol cream (TEMOVATE) 0.05 % Apply 1 application. topically 2 (two) times daily. Apply to the vulva for 4 weeks. 60 g 0   clonazePAM (KLONOPIN) 1 MG tablet Take 1 mg by mouth 2 (two) times daily.     DULoxetine (CYMBALTA) 60 MG capsule Take 1 capsule (60 mg total) by mouth 2 (two) times daily. For back pain 60 capsule 3   moxifloxacin (VIGAMOX) 0.5 % ophthalmic solution Place 1 drop into the left eye 3 (three) times daily. 3 mL 0   benazepril (LOTENSIN) 20 MG tablet Take 1 tablet (20 mg total) by mouth daily. 90 tablet 1   Blood Glucose Monitoring Suppl (TRUE METRIX METER) w/Device KIT use as directed daily before breakfast. 1 kit 0   glucose blood (TRUE METRIX BLOOD GLUCOSE TEST) test strip Use daily 100 each 12   TRUEplus Lancets 28G MISC 1 each by Does not apply route 3 (three) times daily before meals. 100 each 12   No facility-administered medications prior to visit.     ROS Review of Systems  Constitutional:  Negative for activity change and appetite change.  HENT:  Negative for sinus pressure and sore throat.   Respiratory:  Negative for chest tightness, shortness of breath and wheezing.   Cardiovascular:  Negative for chest pain and palpitations.  Gastrointestinal:  Negative for abdominal distention, abdominal pain and constipation.  Genitourinary: Negative.    Musculoskeletal: Negative.   Psychiatric/Behavioral:  Negative for behavioral problems and dysphoric mood.     Objective:  BP 105/72   Pulse 81   Ht 5' 3.5" (1.613 m)   Wt 223 lb 9.6 oz (101.4 kg)   SpO2 95%   BMI 38.99 kg/m      01/12/2023   11:09 AM 12/05/2022    2:30 PM 11/08/2022    1:48 PM  BP/Weight  Systolic BP 105 122 137  Diastolic BP 72 77 79  Wt. (Lbs) 223.6  213  BMI 38.99 kg/m2  37.14 kg/m2  Physical Exam Constitutional:      Appearance: She is well-developed.  Cardiovascular:     Rate and Rhythm: Normal rate.     Heart sounds: Normal heart sounds. No murmur heard. Pulmonary:     Effort: Pulmonary effort is normal.     Breath sounds: Normal breath sounds. No wheezing or rales.  Chest:     Chest wall: No tenderness.  Abdominal:     General: Bowel sounds are normal. There is no distension.     Palpations: Abdomen is soft. There is no mass.     Tenderness: There is no abdominal tenderness.  Musculoskeletal:        General: Normal range of motion.     Right lower leg: No edema.     Left lower leg: No edema.  Neurological:     Mental Status: She is alert and oriented to person, place, and time.  Psychiatric:        Mood and Affect: Mood normal.        Latest Ref Rng & Units 10/07/2022    9:48 AM 06/05/2022    4:13 PM 05/05/2022   12:00 PM  CMP  Glucose 70 - 99 mg/dL 161  096  045   BUN 8 - 27 mg/dL 14  10  10    Creatinine 0.57 - 1.00 mg/dL 4.09  8.11  9.14   Sodium 134 - 144 mmol/L 138  137  139   Potassium 3.5 - 5.2 mmol/L 4.5  4.3  4.4   Chloride 96 - 106 mmol/L 101  100  100   CO2 20 - 29 mmol/L 22  26  22    Calcium 8.7 - 10.3 mg/dL 9.4  78.2  9.4   Total Protein 6.0 - 8.5 g/dL 7.0  7.5  7.4   Total Bilirubin 0.0 - 1.2 mg/dL 0.4  3.4  0.3   Alkaline Phos 44 - 121 IU/L 89  137  88   AST 0 - 40 IU/L 19  361  22   ALT 0 - 32 IU/L 24  354  25     Lipid Panel     Component Value Date/Time   CHOL 196 05/05/2022 1200   TRIG 222 (H)  05/05/2022 1200   HDL 39 (L) 05/05/2022 1200   CHOLHDL 4.3 01/28/2022 1612   CHOLHDL 3.9 04/08/2016 1025   VLDL 12 04/08/2016 1025   LDLCALC 118 (H) 05/05/2022 1200    CBC    Component Value Date/Time   WBC 6.0 06/05/2022 1613   RBC 4.59 06/05/2022 1613   HGB 13.9 06/05/2022 1613   HGB 13.2 06/20/2021 1132   HCT 41.9 06/05/2022 1613   HCT 39.5 06/20/2021 1132   PLT 308 06/05/2022 1613   PLT 268 06/20/2021 1132   MCV 91.3 06/05/2022 1613   MCV 93 06/20/2021 1132   MCH 30.3 06/05/2022 1613   MCHC 33.2 06/05/2022 1613   RDW 12.9 06/05/2022 1613   RDW 12.8 06/20/2021 1132   LYMPHSABS 1.6 06/20/2021 1132   MONOABS 0.3 03/01/2015 1600   EOSABS 0.1 06/20/2021 1132   BASOSABS 0.1 06/20/2021 1132    Lab Results  Component Value Date   HGBA1C 5.7 10/07/2022    Assessment & Plan:      Moderate obesity: Recent weight gain of 10 pounds. Patient has seen a dietician but has not fully implemented recommendations. -Encouraged to continue with dietary modifications and weight loss efforts.  Back Pain: Reports of intermittent back pain, rating it as  a 7/10 on some days. Pain is located in upper back, lower back, and radiates down the right leg. -Continue with over-the-counter pain management (ibuprofen and Tylenol) as needed. -Encouraged to use a heating pad and perform back exercises. -Consider physical therapy if pain persists or worsens.  Hyperlipidemia: Patient has not been taking prescribed atorvastatin. -Restart atorvastatin for prevention of cardiovascular events despite normal cholesterol levels due to history of diabetes.  Type II Diabetes Mellitus: Last A1c was 5.7 and patient is not currently on any medication for diabetes. -Order A1c to assess current glycemic control. -Counseled on Diabetic diet, my plate method, 409 minutes of moderate intensity exercise/week Blood sugar logs with fasting goals of 80-120 mg/dl, random of less than 811 and in the event of sugars less  than 60 mg/dl or greater than 914 mg/dl encouraged to notify the clinic. Advised on the need for annual eye exams, annual foot exams, Pneumonia vaccine.   Hypertension: Blood pressure is well-controlled on benazepril. -Continue benazepril. -Counseled on blood pressure goal of less than 130/80, low-sodium, DASH diet, medication compliance, 150 minutes of moderate intensity exercise per week. Discussed medication compliance, adverse effects.   General Health Maintenance: -Order kidney function, liver function, and cholesterol tests. -Order mammogram as patient is due for one. -Declined flu shot.          Meds ordered this encounter  Medications   benazepril (LOTENSIN) 20 MG tablet    Sig: Take 1 tablet (20 mg total) by mouth daily.    Dispense:  90 tablet    Refill:  1   DISCONTD: glucose blood (TRUE METRIX BLOOD GLUCOSE TEST) test strip    Sig: Use daily    Dispense:  100 each    Refill:  12   DISCONTD: TRUEplus Lancets 28G MISC    Sig: 1 each by Does not apply route 3 (three) times daily before meals.    Dispense:  100 each    Refill:  12    Follow-up: Return in about 6 months (around 07/12/2023) for Chronic medical conditions.       Hoy Register, MD, FAAFP. [facility] [place], [place] [phone]   01/12/2023, 12:01 PM

## 2023-01-13 ENCOUNTER — Other Ambulatory Visit: Payer: Self-pay | Admitting: Family Medicine

## 2023-01-13 DIAGNOSIS — E1169 Type 2 diabetes mellitus with other specified complication: Secondary | ICD-10-CM

## 2023-01-13 LAB — HEMOGLOBIN A1C
Est. average glucose Bld gHb Est-mCnc: 131 mg/dL
Hgb A1c MFr Bld: 6.2 % — ABNORMAL HIGH (ref 4.8–5.6)

## 2023-01-13 LAB — CMP14+EGFR
ALT: 18 IU/L (ref 0–32)
AST: 16 IU/L (ref 0–40)
Albumin: 4.3 g/dL (ref 3.9–4.9)
Alkaline Phosphatase: 85 IU/L (ref 44–121)
BUN/Creatinine Ratio: 15 (ref 12–28)
BUN: 14 mg/dL (ref 8–27)
Bilirubin Total: 0.3 mg/dL (ref 0.0–1.2)
CO2: 24 mmol/L (ref 20–29)
Calcium: 9.5 mg/dL (ref 8.7–10.3)
Chloride: 103 mmol/L (ref 96–106)
Creatinine, Ser: 0.92 mg/dL (ref 0.57–1.00)
Globulin, Total: 3 g/dL (ref 1.5–4.5)
Glucose: 121 mg/dL — ABNORMAL HIGH (ref 70–99)
Potassium: 4.8 mmol/L (ref 3.5–5.2)
Sodium: 141 mmol/L (ref 134–144)
Total Protein: 7.3 g/dL (ref 6.0–8.5)
eGFR: 70 mL/min/{1.73_m2} (ref >59)

## 2023-01-13 LAB — LP+NON-HDL CHOLESTEROL
Cholesterol, Total: 218 mg/dL — ABNORMAL HIGH (ref 100–199)
HDL: 51 mg/dL (ref >39)
LDL Chol Calc (NIH): 135 mg/dL — ABNORMAL HIGH (ref 0–99)
Total Non-HDL-Chol (LDL+VLDL): 167 mg/dL — ABNORMAL HIGH (ref 0–129)
Triglycerides: 180 mg/dL — ABNORMAL HIGH (ref 0–149)
VLDL Cholesterol Cal: 32 mg/dL (ref 5–40)

## 2023-01-13 MED ORDER — ATORVASTATIN CALCIUM 40 MG PO TABS
40.0000 mg | ORAL_TABLET | Freq: Every day | ORAL | 1 refills | Status: DC
Start: 2023-01-13 — End: 2023-07-22

## 2023-01-16 ENCOUNTER — Other Ambulatory Visit: Payer: Self-pay

## 2023-01-26 ENCOUNTER — Other Ambulatory Visit: Payer: Self-pay

## 2023-02-05 ENCOUNTER — Other Ambulatory Visit: Payer: Self-pay

## 2023-02-06 ENCOUNTER — Other Ambulatory Visit: Payer: Self-pay

## 2023-03-02 ENCOUNTER — Other Ambulatory Visit: Payer: Self-pay

## 2023-03-05 ENCOUNTER — Other Ambulatory Visit: Payer: Self-pay

## 2023-03-12 ENCOUNTER — Ambulatory Visit
Admission: RE | Admit: 2023-03-12 | Discharge: 2023-03-12 | Disposition: A | Discharge status: Home / Self Care | Payer: Medicaid Other | Source: Ambulatory Visit | Attending: Family Medicine | Admitting: Family Medicine

## 2023-03-12 DIAGNOSIS — Z1231 Encounter for screening mammogram for malignant neoplasm of breast: Secondary | ICD-10-CM

## 2023-03-16 ENCOUNTER — Other Ambulatory Visit: Payer: Self-pay

## 2023-03-16 ENCOUNTER — Ambulatory Visit: Payer: Medicaid Other | Attending: Obstetrics and Gynecology

## 2023-03-16 DIAGNOSIS — R279 Unspecified lack of coordination: Secondary | ICD-10-CM | POA: Diagnosis not present

## 2023-03-16 DIAGNOSIS — N393 Stress incontinence (female) (male): Secondary | ICD-10-CM | POA: Diagnosis not present

## 2023-03-16 DIAGNOSIS — R293 Abnormal posture: Secondary | ICD-10-CM | POA: Insufficient documentation

## 2023-03-16 DIAGNOSIS — M62838 Other muscle spasm: Secondary | ICD-10-CM | POA: Diagnosis not present

## 2023-03-16 DIAGNOSIS — M5459 Other low back pain: Secondary | ICD-10-CM | POA: Diagnosis not present

## 2023-03-16 DIAGNOSIS — M6281 Muscle weakness (generalized): Secondary | ICD-10-CM | POA: Diagnosis not present

## 2023-03-16 DIAGNOSIS — N8110 Cystocele, unspecified: Secondary | ICD-10-CM | POA: Diagnosis not present

## 2023-03-16 DIAGNOSIS — R350 Frequency of micturition: Secondary | ICD-10-CM | POA: Diagnosis present

## 2023-03-16 NOTE — Patient Instructions (Addendum)
Squatty potty: When your knees are level or below the level of your hips, pelvic floor muscles are pressed against rectum, preventing ease of bowel movement. By getting knees above the level of the hips, these pelvic floor muscles relax, allowing easier passage of bowel movement. Ways to get knees above hips: o Squatty Potty (7inch and 9inch versions) o Small stool o Roll of toilet paper under each foot o Hardback book or stack of magazines under each foot  Relaxed Toileting mechanics: Once in this position, make sure to lean forward with forearms on thighs, wide knees, relaxed stomach, and breathe.   Urge Incontinence  Ideal urination frequency is every 2-4 wakeful hours, which equates to 5-8 times within a 24-hour period.   Urge incontinence is leakage that occurs when the bladder muscle contracts, creating a sudden need to go before getting to the bathroom.   Going too often when your bladder isn't actually full can disrupt the body's automatic signals to store and hold urine longer, which will increase urgency/frequency.  In this case, the bladder "is running the show" and strategies can be learned to retrain this pattern.   One should be able to control the first urge to urinate, at around .  The bladder can hold up to a "grande latte," or . To help you gain control, practice the Urge Drill below when urgency strikes.  This drill will help retrain your bladder signals and allow you to store and hold urine longer.  The overall goal is to stretch out your time between voids to reach a more manageable voiding schedule.    Practice your "quick flicks" often throughout the day (each waking hour) even when you don't need feel the urge to go.  This will help strengthen your pelvic floor muscles, making them more effective in controlling leakage.  Urge Drill  When you feel an urge to go, follow these steps to regain control: Stop what you are doing and be still Take one deep  breath, directing your air into your abdomen Think an affirming thought, such as "I've got this." Do 5 quick flicks of your pelvic floor Walk with control to the bathroom to void, or delay voiding    [facility] [address] [place], [place] [address] Phone # [phone] Fax [phone]

## 2023-03-16 NOTE — Therapy (Signed)
OUTPATIENT PHYSICAL THERAPY FEMALE PELVIC EVALUATION   Patient Name: Christina Burke MRN: [id number] DOB:Jun 22, 1960, 62 y.o., female Today's Date: 03/16/2023  END OF SESSION:  PT End of Session - 03/16/23 1150     Visit Number 1    Date for PT Re-Evaluation 08/31/23    Authorization Type [employer]    PT Start Time 1145    PT Stop Time 1225    PT Time Calculation (min) 40 min    Activity Tolerance Patient tolerated treatment well    Behavior During Therapy WFL for tasks assessed/performed             Past Medical History:  Diagnosis Date   Anxiety    Cancer (HCC)    pt. reports had uterine cancer x 10 years ago   Chest pain 12/08/2013   Gallbladder stone with nonacute cholecystitis    GERD (gastroesophageal reflux disease) 08/10/2014   Hyperlipidemia    Hypertension    Murmur 12/08/2013   Past Surgical History:  Procedure Laterality Date   ABDOMINAL HYSTERECTOMY     ABDOMINAL SURGERY     COLONOSCOPY     UPPER GASTROINTESTINAL ENDOSCOPY  4/21/216   Patient Active Problem List   Diagnosis Date Noted   Lichen sclerosus 09/27/2021   Type 2 diabetes mellitus (HCC) 09/27/2021   Atherosclerotic cardiovascular disease 02/05/2021   Calculus of gallbladder without cholecystitis without obstruction 07/03/2020   Elevated lipase 07/03/2020   Intermittent diarrhea 07/03/2020   Dysphagia 02/19/2020   Schatzki's ring 02/19/2020   Right flank pain 02/19/2020   Constipation 02/19/2020   Family history of colonic polyps 02/19/2020   Spinal stenosis of cervical region 10/18/2019   Other spondylosis with radiculopathy, cervical region 08/09/2019   Vitamin D deficiency 03/23/2018   Hypertension    Anxiety    Hyperlipidemia    GERD (gastroesophageal reflux disease) 08/10/2014   HTN (hypertension) 01/25/2014   Chest pain 12/08/2013   Murmur 12/08/2013    PCP: Hoy Register, MD  REFERRING PROVIDER: Selmer Dominion, NP  REFERRING DIAG: R35.0 (ICD-10-CM)  - Urinary frequency N39.3 (ICD-10-CM) - SUI (stress urinary incontinence, female) N81.10 (ICD-10-CM) - Prolapse of anterior vaginal wall  THERAPY DIAG:  Abnormal posture  Muscle weakness (generalized)  Other muscle spasm  Unspecified lack of coordination  Other low back pain  Rationale for Evaluation and Treatment: Rehabilitation  ONSET DATE: 10 years ago  SUBJECTIVE:                                                                                                                                                                                           SUBJECTIVE STATEMENT:  Pt states that she started having some urinary leakage about 10 years ago and has started getting a little bit worse. She was educated on different options for surgery and pessary.  Fluid intake: Yes: 1-2 plastic bottles of water a day, drinks a lot of unsweet tea, some milk     PAIN:  Are you having pain? Yes NPRS scale: 5/10, 7/10 at worst Pain location:  low back pain, Rt   Pain type: aching Pain description: constant   Aggravating factors: prolonged sitting, prolonged standing, lying down in the bed in one position too long Relieving factors: ibuprofen, changing positions    PRECAUTIONS: None  RED FLAGS: None   WEIGHT BEARING RESTRICTIONS: No  FALLS:  Has patient fallen in last 6 months? No  LIVING ENVIRONMENT: Lives with: lives alone Lives in: House/apartment   OCCUPATION: does not work  PLOF: Independent  PATIENT GOALS: to see if this will help with leakage   PERTINENT HISTORY:  Abdominal hysterectomy, history uterine cancer, cholecystectomy   BOWEL MOVEMENT: Pain with bowel movement: No Type of bowel movement:Frequency every day to every other day and Strain Yes - sometimes Fully empty rectum: No Leakage: No - maybe with diarrhea  Pads: No Fiber supplement: No  URINATION: Pain with urination: No Fully empty bladder: Yes: - Stream: Strong Urgency: Yes: occasional  urgency Frequency: at least 3x/hour, sometimes will wake up at night Leakage: Coughing, Sneezing, Laughing, and sitting down Pads: No, except when she has a cold   INTERCOURSE: Pain with intercourse:  no pain Ability to have vaginal penetration:  Yes: - Climax: states that clonipin makes it difficult Marinoff Scale: 0/3  PREGNANCY: Vaginal deliveries 2 Tearing Yes: episiotomy C-section deliveries 0 Currently pregnant No  PROLAPSE: No sensation of vaginal pressure    OBJECTIVE:  Note: Objective measures were completed at Evaluation unless otherwise noted.  03/16/23: COGNITION: Overall cognitive status: Within functional limits for tasks assessed     SENSATION: Light touch: Appears intact Proprioception: Appears intact  FUNCTIONAL TESTS:  Squat: Large Rt weight shift Single leg stance:  Rt: Lt pelvic drop  Lt:Rt pelvic drop  GAIT: Comments: WNL  POSTURE: rounded shoulders, increased lumbar lordosis, decreased thoracic kyphosis, and anterior pelvic tilt  PELVIC ALIGNMENT: Rt posterior rotation  LUMBARAROM/PROM:  A/PROM A/PROM  Eval (% available)  Flexion 75  Extension 25  Right lateral flexion 25, pain on Rt  Left lateral flexion 50  Right rotation  10  Left rotation 10   (Blank rows = not tested)   PALPATION:   General  Bil lumbar paraspinal restriction/high tone;                 External Perineal Exam some redness/dryness in vestibule and at posterior fourchette; tenderness over bladder externally                              Internal Pelvic Floor no tenderness, some increase in tone in Rt deep pelvic floor  Patient confirms identification and approves PT to assess internal pelvic floor and treatment Yes  PELVIC MMT:   MMT eval  Vaginal 3/5, 2 second hold, 6 repeat contractions   Diastasis Recti 2 finger width separation at umbilicus with distortion with curl-up test  (Blank rows = not tested)        TONE: Increase Rt deep  layer  PROLAPSE: Grade 2 anterior vaginal wall laxity, grade 1 posterior vaginal wall laxity  TODAY'S TREATMENT:  DATE:  03/16/23  EVAL  Neuromuscular re-education: Pelvic floor contraction training with internal vaginal feedback Quick flicks Long holds Urge drill Therapeutic activities: Squatty potty and relaxed toilet mechanics  For all possible CPT codes, reference the Planned Interventions line above.     Check all conditions that are expected to impact treatment: {Conditions expected to impact treatment:None of these apply   If treatment provided at initial evaluation, no treatment charged due to lack of authorization.       PATIENT EDUCATION:  Education details: See above Person educated: Patient Education method: Explanation, Demonstration, Tactile cues, Verbal cues, and Handouts Education comprehension: verbalized understanding  HOME EXERCISE PROGRAM: [id number]  ASSESSMENT:  CLINICAL IMPRESSION: Patient is a 62 y.o. female who was seen today for physical therapy evaluation and treatment for urinary incontinence and bladder prolapse. Exam findings notable for decreased lumbar A/ROM, abnormal posture, decreased functional core strength, 2 finger width diastasis recti with distortion upon curl-up test, tenderness with palpation externally over bladder, pelvic drop during single leg stance, decreased single leg stance time, pelvic floor muscle weakness and decreased endurance, decreased pelvic floor muscle coordination, and anterior/posterior vaginal wall laxity. Signs and symptoms are most consistent with poor bladder habits, poor abdominal pressure management, and pelvic floor muscle weakness. Initial treatment consisted of pelvic floor muscle contraction training, urge drill, and relaxed toilet mechanics. She will continue to benefit from skilled PT  intervention in order to improve bladder control, improve abdominal pressure management, and begin/progress functional strengthening program.  OBJECTIVE IMPAIRMENTS: decreased activity tolerance, decreased coordination, decreased endurance, decreased strength, increased fascial restrictions, increased muscle spasms, impaired tone, postural dysfunction, and pain.   ACTIVITY LIMITATIONS: bending, sitting, standing, squatting, transfers, and continence  PARTICIPATION LIMITATIONS: community activity and daily activities  PERSONAL FACTORS: 1 comorbidity: medical history  are also affecting patient's functional outcome.   REHAB POTENTIAL: Good  CLINICAL DECISION MAKING: Stable/uncomplicated  EVALUATION COMPLEXITY: Low   GOALS: Goals reviewed with patient? Yes  SHORT TERM GOALS: Target date: 04/13/23  Pt will be independent with HEP.   Baseline: Goal status: INITIAL  2.  Pt will increase pelvic floor muscle endurance to 6 second hold.  Baseline:  Goal status: INITIAL  3.  Pt will be able to correctly perform diaphragmatic breathing and appropriate pressure management in order to prevent worsening vaginal wall laxity and improve pelvic floor A/ROM.   Baseline:  Goal status: INITIAL   4.  Pt will be independent with the knack, urge suppression technique, and double voiding in order to improve bladder habits and decrease urinary incontinence.   Baseline:  Goal status: INITIAL   LONG TERM GOALS: Target date: 08/31/2023  Pt will be independent with advanced HEP.   Baseline:  Goal status: INITIAL  2.  Pt will demonstrate pelvic floor muscle contraction strength 4/5.  Baseline:  Goal status: INITIAL  3.  Pt will demonstrate pelvic floor muscle endurance >10 seconds. Baseline:  Goal status: INITIAL  4.  Pt will be able to stand on single LE without pelvic drop for >10 seconds in order to demonstrate good pelvic stability and core strength.  Baseline:  Goal status:  INITIAL  5.  Pt will report no leaks with laughing, coughing, sneezing in order to improve comfort with interpersonal relationships and community activities.   Baseline:  Goal status: INITIAL  6.  Pt will be able to go 2-3 hours in between voids without urgency or incontinence in order to improve QOL and perform all functional activities with less difficulty.  Baseline:  Goal status: INITIAL  PLAN:  PT FREQUENCY: 1-2x/week  PT DURATION: 6 months  PLANNED INTERVENTIONS: 97110-Therapeutic exercises, 97530- Therapeutic activity, [id number]- Neuromuscular re-education, 97535- Self Care, 40981- Manual therapy, Dry Needling, and Biofeedback  PLAN FOR NEXT SESSION: Begin core training/strengthening.    Julio Alm, PT, DPT11/18/2412:35 PM

## 2023-03-30 ENCOUNTER — Encounter: Payer: Self-pay | Admitting: Physical Therapy

## 2023-03-30 ENCOUNTER — Ambulatory Visit: Payer: Medicaid Other | Attending: Obstetrics and Gynecology | Admitting: Physical Therapy

## 2023-03-30 DIAGNOSIS — M5459 Other low back pain: Secondary | ICD-10-CM | POA: Diagnosis present

## 2023-03-30 DIAGNOSIS — R279 Unspecified lack of coordination: Secondary | ICD-10-CM | POA: Insufficient documentation

## 2023-03-30 DIAGNOSIS — M62838 Other muscle spasm: Secondary | ICD-10-CM | POA: Insufficient documentation

## 2023-03-30 DIAGNOSIS — M6281 Muscle weakness (generalized): Secondary | ICD-10-CM | POA: Diagnosis present

## 2023-03-30 DIAGNOSIS — R293 Abnormal posture: Secondary | ICD-10-CM | POA: Insufficient documentation

## 2023-03-30 NOTE — Therapy (Signed)
OUTPATIENT PHYSICAL THERAPY FEMALE PELVIC Treatment   Patient Name: Christina Burke MRN: [id number] DOB:1960-12-01, 62 y.o., female Today's Date: 03/30/2023  END OF SESSION:  PT End of Session - 03/30/23 1224     Visit Number 2    Date for PT Re-Evaluation 08/31/23    Authorization Type [employer]    PT Start Time 1230    PT Stop Time 1309    PT Time Calculation (min) 39 min    Activity Tolerance Patient tolerated treatment well    Behavior During Therapy WFL for tasks assessed/performed             Past Medical History:  Diagnosis Date   Anxiety    Cancer (HCC)    pt. reports had uterine cancer x 10 years ago   Chest pain 12/08/2013   Gallbladder stone with nonacute cholecystitis    GERD (gastroesophageal reflux disease) 08/10/2014   Hyperlipidemia    Hypertension    Murmur 12/08/2013   Past Surgical History:  Procedure Laterality Date   ABDOMINAL HYSTERECTOMY     ABDOMINAL SURGERY     COLONOSCOPY     UPPER GASTROINTESTINAL ENDOSCOPY  4/21/216   Patient Active Problem List   Diagnosis Date Noted   Lichen sclerosus 09/27/2021   Type 2 diabetes mellitus (HCC) 09/27/2021   Atherosclerotic cardiovascular disease 02/05/2021   Calculus of gallbladder without cholecystitis without obstruction 07/03/2020   Elevated lipase 07/03/2020   Intermittent diarrhea 07/03/2020   Dysphagia 02/19/2020   Schatzki's ring 02/19/2020   Right flank pain 02/19/2020   Constipation 02/19/2020   Family history of colonic polyps 02/19/2020   Spinal stenosis of cervical region 10/18/2019   Other spondylosis with radiculopathy, cervical region 08/09/2019   Vitamin D deficiency 03/23/2018   Hypertension    Anxiety    Hyperlipidemia    GERD (gastroesophageal reflux disease) 08/10/2014   HTN (hypertension) 01/25/2014   Chest pain 12/08/2013   Murmur 12/08/2013    PCP: Hoy Register, MD  REFERRING PROVIDER: Selmer Dominion, NP  REFERRING DIAG: R35.0 (ICD-10-CM) -  Urinary frequency N39.3 (ICD-10-CM) - SUI (stress urinary incontinence, female) N81.10 (ICD-10-CM) - Prolapse of anterior vaginal wall  THERAPY DIAG:  Abnormal posture  Muscle weakness (generalized)  Other muscle spasm  Unspecified lack of coordination  Rationale for Evaluation and Treatment: Rehabilitation  ONSET DATE: 10 years ago  SUBJECTIVE:                                                                                                                                                                                           SUBJECTIVE STATEMENT: Had a cold after last  session for 2 weeks and with coughing, blowing nose, sneezing and had worse leakage with this. Reports she did not do exercises while sick.   Fluid intake: Yes: 1-2 plastic bottles of water a day, drinks a lot of unsweet tea, some milk     PAIN:  Are you having pain? Yes NPRS scale: 5/10, 7/10 at worst Pain location:  low back pain, Rt   Pain type: aching Pain description: constant   Aggravating factors: prolonged sitting, prolonged standing, lying down in the bed in one position too long Relieving factors: ibuprofen, changing positions    PRECAUTIONS: None  RED FLAGS: None   WEIGHT BEARING RESTRICTIONS: No  FALLS:  Has patient fallen in last 6 months? No  LIVING ENVIRONMENT: Lives with: lives alone Lives in: House/apartment   OCCUPATION: does not work  PLOF: Independent  PATIENT GOALS: to see if this will help with leakage   PERTINENT HISTORY:  Abdominal hysterectomy, history uterine cancer, cholecystectomy   BOWEL MOVEMENT: Pain with bowel movement: No Type of bowel movement:Frequency every day to every other day and Strain Yes - sometimes Fully empty rectum: No Leakage: No - maybe with diarrhea  Pads: No Fiber supplement: No  URINATION: Pain with urination: No Fully empty bladder: Yes: - Stream: Strong Urgency: Yes: occasional urgency Frequency: at least 3x/hour, sometimes  will wake up at night Leakage: Coughing, Sneezing, Laughing, and sitting down Pads: No, except when she has a cold   INTERCOURSE: Pain with intercourse:  no pain Ability to have vaginal penetration:  Yes: - Climax: states that clonipin makes it difficult Marinoff Scale: 0/3  PREGNANCY: Vaginal deliveries 2 Tearing Yes: episiotomy C-section deliveries 0 Currently pregnant No  PROLAPSE: No sensation of vaginal pressure    OBJECTIVE:  Note: Objective measures were completed at Evaluation unless otherwise noted.  03/16/23: COGNITION: Overall cognitive status: Within functional limits for tasks assessed     SENSATION: Light touch: Appears intact Proprioception: Appears intact  FUNCTIONAL TESTS:  Squat: Large Rt weight shift Single leg stance:  Rt: Lt pelvic drop  Lt:Rt pelvic drop  GAIT: Comments: WNL  POSTURE: rounded shoulders, increased lumbar lordosis, decreased thoracic kyphosis, and anterior pelvic tilt  PELVIC ALIGNMENT: Rt posterior rotation  LUMBARAROM/PROM:  A/PROM A/PROM  Eval (% available)  Flexion 75  Extension 25  Right lateral flexion 25, pain on Rt  Left lateral flexion 50  Right rotation  10  Left rotation 10   (Blank rows = not tested)   PALPATION:   General  Bil lumbar paraspinal restriction/high tone;                 External Perineal Exam some redness/dryness in vestibule and at posterior fourchette; tenderness over bladder externally                              Internal Pelvic Floor no tenderness, some increase in tone in Rt deep pelvic floor  Patient confirms identification and approves PT to assess internal pelvic floor and treatment Yes  PELVIC MMT:   MMT eval  Vaginal 3/5, 2 second hold, 6 repeat contractions   Diastasis Recti 2 finger width separation at umbilicus with distortion with curl-up test  (Blank rows = not tested)        TONE: Increase Rt deep layer  PROLAPSE: Grade 2 anterior vaginal wall laxity, grade 1  posterior vaginal wall laxity  TODAY'S TREATMENT:  DATE:  03/16/23  EVAL  Neuromuscular re-education: Pelvic floor contraction training with internal vaginal feedback Quick flicks Long holds Urge drill Therapeutic activities: Squatty potty and relaxed toilet mechanics 03/30/23  NMRE: all exercises for exhale and pelvic floor contraction with activity for improved coordination of muscle activation and decreased stress at pelvic floor for decreased leakage.   2x10 seated pelvic floor contractions with coordination of exhale on towel roll for improved awareness of pelvic floor activation 2x10 quick flicks in sitting on towel roll Isometric pelvic floor contractions 5s x10 in sitting with towel roll Reviewed voiding mechanics to prevent/limit strain Hooklying opp hand/knee ball press 2x10 Hooklying ball squeeze 2x10  Hooklying hip abduction green band 2x10 Hooklying hip flexion green band 2x10 X10 Sit to stands from table lowest setting  PATIENT EDUCATION:  Education details: [id number], urge drill, voiding mechanics Person educated: Patient Education method: Explanation, Demonstration, Tactile cues, Verbal cues, and Handouts Education comprehension: verbalized understanding  HOME EXERCISE PROGRAM: [id number]  ASSESSMENT:  CLINICAL IMPRESSION: Patient presents for treatment today, focus on coordination of pelvic floor and exercise with breathing mechanics to decreased strain at pelvic floor with stressors as well as increase strength at core and hips. Pt tolerated session well and denied leakage throughout but does require max verbal cues for technique and coordination to initiate exercises. She will continue to benefit from skilled PT intervention in order to improve bladder control, improve abdominal pressure management, and begin/progress functional strengthening  program.  OBJECTIVE IMPAIRMENTS: decreased activity tolerance, decreased coordination, decreased endurance, decreased strength, increased fascial restrictions, increased muscle spasms, impaired tone, postural dysfunction, and pain.   ACTIVITY LIMITATIONS: bending, sitting, standing, squatting, transfers, and continence  PARTICIPATION LIMITATIONS: community activity and daily activities  PERSONAL FACTORS: 1 comorbidity: medical history  are also affecting patient's functional outcome.   REHAB POTENTIAL: Good  CLINICAL DECISION MAKING: Stable/uncomplicated  EVALUATION COMPLEXITY: Low   GOALS: Goals reviewed with patient? Yes  SHORT TERM GOALS: Target date: 04/13/23  Pt will be independent with HEP.   Baseline: Goal status: INITIAL  2.  Pt will increase pelvic floor muscle endurance to 6 second hold.  Baseline:  Goal status: INITIAL  3.  Pt will be able to correctly perform diaphragmatic breathing and appropriate pressure management in order to prevent worsening vaginal wall laxity and improve pelvic floor A/ROM.   Baseline:  Goal status: INITIAL   4.  Pt will be independent with the knack, urge suppression technique, and double voiding in order to improve bladder habits and decrease urinary incontinence.   Baseline:  Goal status: INITIAL   LONG TERM GOALS: Target date: 08/31/2023  Pt will be independent with advanced HEP.   Baseline:  Goal status: INITIAL  2.  Pt will demonstrate pelvic floor muscle contraction strength 4/5.  Baseline:  Goal status: INITIAL  3.  Pt will demonstrate pelvic floor muscle endurance >10 seconds. Baseline:  Goal status: INITIAL  4.  Pt will be able to stand on single LE without pelvic drop for >10 seconds in order to demonstrate good pelvic stability and core strength.  Baseline:  Goal status: INITIAL  5.  Pt will report no leaks with laughing, coughing, sneezing in order to improve comfort with interpersonal relationships and  community activities.   Baseline:  Goal status: INITIAL  6.  Pt will be able to go 2-3 hours in between voids without urgency or incontinence in order to improve QOL and perform all functional activities with less difficulty.   Baseline:  Goal  status: INITIAL  PLAN:  PT FREQUENCY: 1-2x/week  PT DURATION: 6 months  PLANNED INTERVENTIONS: 97110-Therapeutic exercises, 97530- Therapeutic activity, [id number]- Neuromuscular re-education, 97535- Self Care, 53664- Manual therapy, Dry Needling, and Biofeedback  PLAN FOR NEXT SESSION: Begin core training/strengthening.    Otelia Sergeant, PT, DPT 12/02/242:09 PM

## 2023-04-06 ENCOUNTER — Ambulatory Visit: Payer: Medicaid Other

## 2023-04-06 DIAGNOSIS — M6281 Muscle weakness (generalized): Secondary | ICD-10-CM

## 2023-04-06 DIAGNOSIS — R293 Abnormal posture: Secondary | ICD-10-CM | POA: Diagnosis not present

## 2023-04-06 DIAGNOSIS — R279 Unspecified lack of coordination: Secondary | ICD-10-CM

## 2023-04-06 DIAGNOSIS — M5459 Other low back pain: Secondary | ICD-10-CM

## 2023-04-06 DIAGNOSIS — M62838 Other muscle spasm: Secondary | ICD-10-CM

## 2023-04-06 NOTE — Therapy (Signed)
OUTPATIENT PHYSICAL THERAPY FEMALE PELVIC Treatment   Patient Name: Christina Burke MRN: [id number] DOB:1960/06/26, 62 y.o., female Today's Date: 04/06/2023  END OF SESSION:  PT End of Session - 04/06/23 1019     Visit Number 3    Date for PT Re-Evaluation 08/31/23    Authorization Type [employer]    PT Start Time 1015    PT Stop Time 1055    PT Time Calculation (min) 40 min    Activity Tolerance Patient tolerated treatment well    Behavior During Therapy WFL for tasks assessed/performed              Past Medical History:  Diagnosis Date   Anxiety    Cancer (HCC)    pt. reports had uterine cancer x 10 years ago   Chest pain 12/08/2013   Gallbladder stone with nonacute cholecystitis    GERD (gastroesophageal reflux disease) 08/10/2014   Hyperlipidemia    Hypertension    Murmur 12/08/2013   Past Surgical History:  Procedure Laterality Date   ABDOMINAL HYSTERECTOMY     ABDOMINAL SURGERY     COLONOSCOPY     UPPER GASTROINTESTINAL ENDOSCOPY  4/21/216   Patient Active Problem List   Diagnosis Date Noted   Lichen sclerosus 09/27/2021   Type 2 diabetes mellitus (HCC) 09/27/2021   Atherosclerotic cardiovascular disease 02/05/2021   Calculus of gallbladder without cholecystitis without obstruction 07/03/2020   Elevated lipase 07/03/2020   Intermittent diarrhea 07/03/2020   Dysphagia 02/19/2020   Schatzki's ring 02/19/2020   Right flank pain 02/19/2020   Constipation 02/19/2020   Family history of colonic polyps 02/19/2020   Spinal stenosis of cervical region 10/18/2019   Other spondylosis with radiculopathy, cervical region 08/09/2019   Vitamin D deficiency 03/23/2018   Hypertension    Anxiety    Hyperlipidemia    GERD (gastroesophageal reflux disease) 08/10/2014   HTN (hypertension) 01/25/2014   Chest pain 12/08/2013   Murmur 12/08/2013    PCP: Hoy Register, MD  REFERRING PROVIDER: Selmer Dominion, NP  REFERRING DIAG: R35.0 (ICD-10-CM)  - Urinary frequency N39.3 (ICD-10-CM) - SUI (stress urinary incontinence, female) N81.10 (ICD-10-CM) - Prolapse of anterior vaginal wall  THERAPY DIAG:  Abnormal posture  Muscle weakness (generalized)  Other muscle spasm  Unspecified lack of coordination  Other low back pain  Rationale for Evaluation and Treatment: Rehabilitation  ONSET DATE: 10 years ago  SUBJECTIVE:                                                                                                                                                                                           SUBJECTIVE  STATEMENT: Pt states that she has not noticed much difference since her last visit.   PAIN:  Are you having pain? Yes NPRS scale: 5/10 Pain location:  low back pain, Rt   Pain type: aching Pain description: constant   Aggravating factors: prolonged sitting, prolonged standing, lying down in the bed in one position too long Relieving factors: ibuprofen, changing positions    PRECAUTIONS: None  RED FLAGS: None   WEIGHT BEARING RESTRICTIONS: No  FALLS:  Has patient fallen in last 6 months? No  LIVING ENVIRONMENT: Lives with: lives alone Lives in: House/apartment   OCCUPATION: does not work  PLOF: Independent  PATIENT GOALS: to see if this will help with leakage   PERTINENT HISTORY:  Abdominal hysterectomy, history uterine cancer, cholecystectomy   BOWEL MOVEMENT: Pain with bowel movement: No Type of bowel movement:Frequency every day to every other day and Strain Yes - sometimes Fully empty rectum: No Leakage: No - maybe with diarrhea  Pads: No Fiber supplement: No  URINATION: Pain with urination: No Fully empty bladder: Yes: - Stream: Strong Urgency: Yes: occasional urgency Frequency: at least 3x/hour, sometimes will wake up at night Leakage: Coughing, Sneezing, Laughing, and sitting down Pads: No, except when she has a cold   INTERCOURSE: Pain with intercourse:  no pain Ability to  have vaginal penetration:  Yes: - Climax: states that clonipin makes it difficult Marinoff Scale: 0/3  PREGNANCY: Vaginal deliveries 2 Tearing Yes: episiotomy C-section deliveries 0 Currently pregnant No  PROLAPSE: No sensation of vaginal pressure    OBJECTIVE:  Note: Objective measures were completed at Evaluation unless otherwise noted.  03/16/23: COGNITION: Overall cognitive status: Within functional limits for tasks assessed     SENSATION: Light touch: Appears intact Proprioception: Appears intact  FUNCTIONAL TESTS:  Squat: Large Rt weight shift Single leg stance:  Rt: Lt pelvic drop  Lt:Rt pelvic drop  GAIT: Comments: WNL  POSTURE: rounded shoulders, increased lumbar lordosis, decreased thoracic kyphosis, and anterior pelvic tilt  PELVIC ALIGNMENT: Rt posterior rotation  LUMBARAROM/PROM:  A/PROM A/PROM  Eval (% available)  Flexion 75  Extension 25  Right lateral flexion 25, pain on Rt  Left lateral flexion 50  Right rotation  10  Left rotation 10   (Blank rows = not tested)   PALPATION:   General  Bil lumbar paraspinal restriction/high tone;                 External Perineal Exam some redness/dryness in vestibule and at posterior fourchette; tenderness over bladder externally                              Internal Pelvic Floor no tenderness, some increase in tone in Rt deep pelvic floor  Patient confirms identification and approves PT to assess internal pelvic floor and treatment Yes  PELVIC MMT:   MMT eval  Vaginal 3/5, 2 second hold, 6 repeat contractions   Diastasis Recti 2 finger width separation at umbilicus with distortion with curl-up test  (Blank rows = not tested)        TONE: Increase Rt deep layer  PROLAPSE: Grade 2 anterior vaginal wall laxity, grade 1 posterior vaginal wall laxity  TODAY'S TREATMENT:  DATE:  04/06/23 Neuromuscular re-education: Transversus abdominus training with multimodal cues for improved motor control and breath coordination  Transversus abdominus isometrics 10x Supine UE ball press with transversus abdominus and pelvic floor muscle 12x Supine hip adduction with transversus abdominus and pelvic floor muscle 12x Supine resisted march with opposite hand press 10 x bil Bridge with hip adduction, transversus abdominus, and pelvic floor muscle 2 x 10  Seated hip adduction ball press with transversus abdominus and pelvic floor muscle 2 x 10 add Seated hip adduction ball press with transversus abdominus and pelvic floor muscle 2 x 10  Seated resisted march red band with transversus abdominus and pelvic floor muscle 2 x 10  03/30/23  NMRE: all exercises for exhale and pelvic floor contraction with activity for improved coordination of muscle activation and decreased stress at pelvic floor for decreased leakage.   2x10 seated pelvic floor contractions with coordination of exhale on towel roll for improved awareness of pelvic floor activation 2x10 quick flicks in sitting on towel roll Isometric pelvic floor contractions 5s x10 in sitting with towel roll Reviewed voiding mechanics to prevent/limit strain Hooklying opp hand/knee ball press 2x10 Hooklying ball squeeze 2x10  Hooklying hip abduction green band 2x10 Hooklying hip flexion green band 2x10 X10 Sit to stands from table lowest setting  03/16/23  EVAL  Neuromuscular re-education: Pelvic floor contraction training with internal vaginal feedback Quick flicks Long holds Urge drill Therapeutic activities: Training and development officer and relaxed toilet mechanics    PATIENT EDUCATION:  Education details: [id number], urge drill, voiding mechanics Person educated: Patient Education method: Explanation, Demonstration, Tactile cues, Verbal cues, and Handouts Education comprehension: verbalized understanding  HOME EXERCISE  PROGRAM: [id number]  ASSESSMENT:  CLINICAL IMPRESSION: Pt has not seen any progress since last session, but did not perform any exercises in the last week. We decided to perform specific core training to make sure transversus abdominus was working with pelvic floor muscle and then review exercises from last session, updated HEP with written instructions and pictures. She initially had difficulty getting a strong and coordinated transversus abdominus contraction with pelvic floor muscles, but responded very well to multimodal cues and use of ball for more forceful contraction. She did very well with all exercises, coordinating breath appropriately. She will continue to benefit from skilled PT intervention in order to improve bladder control, improve abdominal pressure management, and begin/progress functional strengthening program.  OBJECTIVE IMPAIRMENTS: decreased activity tolerance, decreased coordination, decreased endurance, decreased strength, increased fascial restrictions, increased muscle spasms, impaired tone, postural dysfunction, and pain.   ACTIVITY LIMITATIONS: bending, sitting, standing, squatting, transfers, and continence  PARTICIPATION LIMITATIONS: community activity and daily activities  PERSONAL FACTORS: 1 comorbidity: medical history  are also affecting patient's functional outcome.   REHAB POTENTIAL: Good  CLINICAL DECISION MAKING: Stable/uncomplicated  EVALUATION COMPLEXITY: Low   GOALS: Goals reviewed with patient? Yes  SHORT TERM GOALS: Target date: 04/13/23   Pt will be independent with HEP.   Baseline: Goal status: INITIAL  2.  Pt will increase pelvic floor muscle endurance to 6 second hold.  Baseline:  Goal status: INITIAL  3.  Pt will be able to correctly perform diaphragmatic breathing and appropriate pressure management in order to prevent worsening vaginal wall laxity and improve pelvic floor A/ROM.   Baseline:  Goal status: INITIAL   4.  Pt will  be independent with the knack, urge suppression technique, and double voiding in order to improve bladder habits and decrease urinary incontinence.   Baseline:  Goal status: INITIAL  LONG TERM GOALS: Target date: 08/31/2023  Pt will be independent with advanced HEP.   Baseline:  Goal status: INITIAL  2.  Pt will demonstrate pelvic floor muscle contraction strength 4/5.  Baseline:  Goal status: INITIAL  3.  Pt will demonstrate pelvic floor muscle endurance >10 seconds. Baseline:  Goal status: INITIAL  4.  Pt will be able to stand on single LE without pelvic drop for >10 seconds in order to demonstrate good pelvic stability and core strength.  Baseline:  Goal status: INITIAL  5.  Pt will report no leaks with laughing, coughing, sneezing in order to improve comfort with interpersonal relationships and community activities.   Baseline:  Goal status: INITIAL  6.  Pt will be able to go 2-3 hours in between voids without urgency or incontinence in order to improve QOL and perform all functional activities with less difficulty.   Baseline:  Goal status: INITIAL  PLAN:  PT FREQUENCY: 1-2x/week  PT DURATION: 6 months  PLANNED INTERVENTIONS: 97110-Therapeutic exercises, 97530- Therapeutic activity, 97112- Neuromuscular re-education, 97535- Self Care, 65784- Manual therapy, Dry Needling, and Biofeedback  PLAN FOR NEXT SESSION: Progress cores strengthening   Julio Alm, PT, DPT12/12/2408:57 AM

## 2023-04-13 ENCOUNTER — Ambulatory Visit: Payer: Medicaid Other

## 2023-04-13 DIAGNOSIS — R279 Unspecified lack of coordination: Secondary | ICD-10-CM

## 2023-04-13 DIAGNOSIS — M62838 Other muscle spasm: Secondary | ICD-10-CM

## 2023-04-13 DIAGNOSIS — M5459 Other low back pain: Secondary | ICD-10-CM

## 2023-04-13 DIAGNOSIS — R293 Abnormal posture: Secondary | ICD-10-CM | POA: Diagnosis not present

## 2023-04-13 DIAGNOSIS — M6281 Muscle weakness (generalized): Secondary | ICD-10-CM

## 2023-04-13 NOTE — Therapy (Signed)
OUTPATIENT PHYSICAL THERAPY FEMALE PELVIC Treatment   Patient Name: Christina Burke MRN: [id number] DOB:Sep 27, 1960, 62 y.o., female Today's Date: 04/13/2023  END OF SESSION:  PT End of Session - 04/13/23 1056     Visit Number 4    Date for PT Re-Evaluation 08/31/23    Authorization Type [employer]    PT Start Time 1100    PT Stop Time 1140    PT Time Calculation (min) 40 min    Activity Tolerance Patient tolerated treatment well    Behavior During Therapy WFL for tasks assessed/performed              Past Medical History:  Diagnosis Date   Anxiety    Cancer (HCC)    pt. reports had uterine cancer x 10 years ago   Chest pain 12/08/2013   Gallbladder stone with nonacute cholecystitis    GERD (gastroesophageal reflux disease) 08/10/2014   Hyperlipidemia    Hypertension    Murmur 12/08/2013   Past Surgical History:  Procedure Laterality Date   ABDOMINAL HYSTERECTOMY     ABDOMINAL SURGERY     COLONOSCOPY     UPPER GASTROINTESTINAL ENDOSCOPY  4/21/216   Patient Active Problem List   Diagnosis Date Noted   Lichen sclerosus 09/27/2021   Type 2 diabetes mellitus (HCC) 09/27/2021   Atherosclerotic cardiovascular disease 02/05/2021   Calculus of gallbladder without cholecystitis without obstruction 07/03/2020   Elevated lipase 07/03/2020   Intermittent diarrhea 07/03/2020   Dysphagia 02/19/2020   Schatzki's ring 02/19/2020   Right flank pain 02/19/2020   Constipation 02/19/2020   Family history of colonic polyps 02/19/2020   Spinal stenosis of cervical region 10/18/2019   Other spondylosis with radiculopathy, cervical region 08/09/2019   Vitamin D deficiency 03/23/2018   Hypertension    Anxiety    Hyperlipidemia    GERD (gastroesophageal reflux disease) 08/10/2014   HTN (hypertension) 01/25/2014   Chest pain 12/08/2013   Murmur 12/08/2013    PCP: Hoy Register, MD  REFERRING PROVIDER: Selmer Dominion, NP  REFERRING DIAG: R35.0  (ICD-10-CM) - Urinary frequency N39.3 (ICD-10-CM) - SUI (stress urinary incontinence, female) N81.10 (ICD-10-CM) - Prolapse of anterior vaginal wall  THERAPY DIAG:  Abnormal posture  Muscle weakness (generalized)  Other muscle spasm  Unspecified lack of coordination  Other low back pain  Rationale for Evaluation and Treatment: Rehabilitation  ONSET DATE: 10 years ago  SUBJECTIVE:                                                                                                                                                                                           SUBJECTIVE  STATEMENT: Pt states that she is not seeing any progress yet with urinary incontinence. She had a episode when she was sitting and felt vaginal muscles contract on their own, but there was no pain. She states that this used to happen when she was younger. She feels like her low back is about the same as well.   PAIN:  Are you having pain? Yes NPRS scale: 3/10 Pain location:  low back pain, Rt   Pain type: aching Pain description: constant   Aggravating factors: prolonged sitting, prolonged standing, lying down in the bed in one position too long Relieving factors: ibuprofen, changing positions    PRECAUTIONS: None  RED FLAGS: None   WEIGHT BEARING RESTRICTIONS: No  FALLS:  Has patient fallen in last 6 months? No  LIVING ENVIRONMENT: Lives with: lives alone Lives in: House/apartment   OCCUPATION: does not work  PLOF: Independent  PATIENT GOALS: to see if this will help with leakage   PERTINENT HISTORY:  Abdominal hysterectomy, history uterine cancer, cholecystectomy   BOWEL MOVEMENT: Pain with bowel movement: No Type of bowel movement:Frequency every day to every other day and Strain Yes - sometimes Fully empty rectum: No Leakage: No - maybe with diarrhea  Pads: No Fiber supplement: No  URINATION: Pain with urination: No Fully empty bladder: Yes: - Stream: Strong Urgency: Yes:  occasional urgency Frequency: at least 3x/hour, sometimes will wake up at night Leakage: Coughing, Sneezing, Laughing, and sitting down Pads: No, except when she has a cold   INTERCOURSE: Pain with intercourse:  no pain Ability to have vaginal penetration:  Yes: - Climax: states that clonipin makes it difficult Marinoff Scale: 0/3  PREGNANCY: Vaginal deliveries 2 Tearing Yes: episiotomy C-section deliveries 0 Currently pregnant No  PROLAPSE: No sensation of vaginal pressure    OBJECTIVE:  Note: Objective measures were completed at Evaluation unless otherwise noted.  03/16/23: COGNITION: Overall cognitive status: Within functional limits for tasks assessed     SENSATION: Light touch: Appears intact Proprioception: Appears intact  FUNCTIONAL TESTS:  Squat: Large Rt weight shift Single leg stance:  Rt: Lt pelvic drop  Lt:Rt pelvic drop  GAIT: Comments: WNL  POSTURE: rounded shoulders, increased lumbar lordosis, decreased thoracic kyphosis, and anterior pelvic tilt  PELVIC ALIGNMENT: Rt posterior rotation  LUMBARAROM/PROM:  A/PROM A/PROM  Eval (% available)  Flexion 75  Extension 25  Right lateral flexion 25, pain on Rt  Left lateral flexion 50  Right rotation  10  Left rotation 10   (Blank rows = not tested)   PALPATION:   General  Bil lumbar paraspinal restriction/high tone;                 External Perineal Exam some redness/dryness in vestibule and at posterior fourchette; tenderness over bladder externally                              Internal Pelvic Floor no tenderness, some increase in tone in Rt deep pelvic floor  Patient confirms identification and approves PT to assess internal pelvic floor and treatment Yes  PELVIC MMT:   MMT eval  Vaginal 3/5, 2 second hold, 6 repeat contractions   Diastasis Recti 2 finger width separation at umbilicus with distortion with curl-up test  (Blank rows = not tested)        TONE: Increase Rt deep  layer  PROLAPSE: Grade 2 anterior vaginal wall laxity, grade 1 posterior vaginal wall laxity  TODAY'S TREATMENT:                                                                                                                              DATE:  04/13/23 Neuromuscular re-education: Supine leg extensions with transversus abdominus and pelvic floor muscle 10x bil Supine resisted march with red band 2 x 10 Seated hip abduction red band with transversus abdominus and pelvic floor muscle 2 x 10 Seated resisted march red band with transversus abdominus and pelvic floor muscle 2 x 10 Seated resisted trunk rotation 2 x 10 bil red band Seated horizontal abduction 2 x 10 red band  Exercises: Supine piriformis stretch 60 seconds Lower trunk rotation 2 x 10  04/06/23 Neuromuscular re-education: Transversus abdominus training with multimodal cues for improved motor control and breath coordination  Transversus abdominus isometrics 10x Supine UE ball press with transversus abdominus and pelvic floor muscle 12x Supine hip adduction with transversus abdominus and pelvic floor muscle 12x Supine resisted march with opposite hand press 10 x bil Bridge with hip adduction, transversus abdominus, and pelvic floor muscle 2 x 10  Seated hip adduction ball press with transversus abdominus and pelvic floor muscle 2 x 10 add Seated hip adduction ball press with transversus abdominus and pelvic floor muscle 2 x 10  Seated resisted march red band with transversus abdominus and pelvic floor muscle 2 x 10  03/30/23  NMRE: all exercises for exhale and pelvic floor contraction with activity for improved coordination of muscle activation and decreased stress at pelvic floor for decreased leakage.   2x10 seated pelvic floor contractions with coordination of exhale on towel roll for improved awareness of pelvic floor activation 2x10 quick flicks in sitting on towel roll Isometric pelvic floor contractions 5s x10 in  sitting with towel roll Reviewed voiding mechanics to prevent/limit strain Hooklying opp hand/knee ball press 2x10 Hooklying ball squeeze 2x10  Hooklying hip abduction green band 2x10 Hooklying hip flexion green band 2x10 X10 Sit to stands from table lowest setting   PATIENT EDUCATION:  Education details: [id number], urge drill, voiding mechanics Person educated: Patient Education method: Explanation, Demonstration, Tactile cues, Verbal cues, and Handouts Education comprehension: verbalized understanding  HOME EXERCISE PROGRAM: [id number]  ASSESSMENT:  CLINICAL IMPRESSION: Pt not seeing any progress with urinary incontinence yet. She has more difficulty with use of Rt LE due to low back pain. We discussed utilizing her deep core when she is going to lift Rt LE in order to determine if increasing core support helps to stabilize her, allowing her more comfortable use of Rt LE. She did well with strengthening progressions today to include more challenging activities, but did fatigue faster on Rt side compared to Lt. Due to report of what sounds like pelvic floor muscle spasm even though there was not pain, we also started adding in some mobility and stretching to her program. She will continue to benefit from skilled PT intervention in order to improve bladder control, improve  abdominal pressure management, and begin/progress functional strengthening program.  OBJECTIVE IMPAIRMENTS: decreased activity tolerance, decreased coordination, decreased endurance, decreased strength, increased fascial restrictions, increased muscle spasms, impaired tone, postural dysfunction, and pain.   ACTIVITY LIMITATIONS: bending, sitting, standing, squatting, transfers, and continence  PARTICIPATION LIMITATIONS: community activity and daily activities  PERSONAL FACTORS: 1 comorbidity: medical history  are also affecting patient's functional outcome.   REHAB POTENTIAL: Good  CLINICAL DECISION MAKING:  Stable/uncomplicated  EVALUATION COMPLEXITY: Low   GOALS: Goals reviewed with patient? Yes  SHORT TERM GOALS: Target date: 04/13/23 - updated 04/13/23  Pt will be independent with HEP.   Baseline: Goal status: MET 04/13/23  2.  Pt will increase pelvic floor muscle endurance to 6 second hold.  Baseline:  Goal status: IN PROGRESS 04/13/23  3.  Pt will be able to correctly perform diaphragmatic breathing and appropriate pressure management in order to prevent worsening vaginal wall laxity and improve pelvic floor A/ROM.   Baseline:  Goal status: IN PROGRESS 12/16/24AL   4.  Pt will be independent with the knack, urge suppression technique, and double voiding in order to improve bladder habits and decrease urinary incontinence.   Baseline:  Goal status: MET 04/13/23   LONG TERM GOALS: Target date: 08/31/2023 - updated 04/13/23  Pt will be independent with advanced HEP.   Baseline:  Goal status: IN PROGRESS 04/13/23  2.  Pt will demonstrate pelvic floor muscle contraction strength 4/5.  Baseline:  Goal status: IN PROGRESS 04/13/23  3.  Pt will demonstrate pelvic floor muscle endurance >10 seconds. Baseline:  Goal status: IN PROGRESS 04/13/23  4.  Pt will be able to stand on single LE without pelvic drop for >10 seconds in order to demonstrate good pelvic stability and core strength.  Baseline:  Goal status: IN PROGRESS 04/13/23  5.  Pt will report no leaks with laughing, coughing, sneezing in order to improve comfort with interpersonal relationships and community activities.   Baseline:  Goal status: IN PROGRESS 04/13/23  6.  Pt will be able to go 2-3 hours in between voids without urgency or incontinence in order to improve QOL and perform all functional activities with less difficulty.   Baseline:  Goal status: IN PROGRESS 04/13/23  PLAN:  PT FREQUENCY: 1-2x/week  PT DURATION: 6 months  PLANNED INTERVENTIONS: 97110-Therapeutic exercises, 97530- Therapeutic  activity, 97112- Neuromuscular re-education, 97535- Self Care, [id number]- Manual therapy, Dry Needling, and Biofeedback  PLAN FOR NEXT SESSION: Progress cores strengthening   Julio Alm, PT, DPT12/16/2410:57 AM

## 2023-04-17 ENCOUNTER — Encounter: Payer: Self-pay | Admitting: Family Medicine

## 2023-04-17 NOTE — Telephone Encounter (Signed)
 Care team updated and letter sent for eye exam notes.

## 2023-04-23 ENCOUNTER — Ambulatory Visit: Payer: Medicaid Other

## 2023-04-23 DIAGNOSIS — R279 Unspecified lack of coordination: Secondary | ICD-10-CM

## 2023-04-23 DIAGNOSIS — R293 Abnormal posture: Secondary | ICD-10-CM | POA: Diagnosis not present

## 2023-04-23 DIAGNOSIS — M6281 Muscle weakness (generalized): Secondary | ICD-10-CM

## 2023-04-23 DIAGNOSIS — M5459 Other low back pain: Secondary | ICD-10-CM

## 2023-04-23 DIAGNOSIS — M62838 Other muscle spasm: Secondary | ICD-10-CM

## 2023-04-23 NOTE — Therapy (Signed)
OUTPATIENT PHYSICAL THERAPY FEMALE PELVIC Treatment   Patient Name: NEZZIE MAYER MRN: [id number] DOB:01-11-1961, 62 y.o., female Today's Date: 04/23/2023  END OF SESSION:  PT End of Session - 04/23/23 1104     Visit Number 5    Authorization Type [employer]    PT Start Time 1100    PT Stop Time 1140    PT Time Calculation (min) 40 min    Activity Tolerance Patient tolerated treatment well    Behavior During Therapy WFL for tasks assessed/performed              Past Medical History:  Diagnosis Date   Anxiety    Cancer (HCC)    pt. reports had uterine cancer x 10 years ago   Chest pain 12/08/2013   Gallbladder stone with nonacute cholecystitis    GERD (gastroesophageal reflux disease) 08/10/2014   Hyperlipidemia    Hypertension    Murmur 12/08/2013   Past Surgical History:  Procedure Laterality Date   ABDOMINAL HYSTERECTOMY     ABDOMINAL SURGERY     COLONOSCOPY     UPPER GASTROINTESTINAL ENDOSCOPY  4/21/216   Patient Active Problem List   Diagnosis Date Noted   Lichen sclerosus 09/27/2021   Type 2 diabetes mellitus (HCC) 09/27/2021   Atherosclerotic cardiovascular disease 02/05/2021   Calculus of gallbladder without cholecystitis without obstruction 07/03/2020   Elevated lipase 07/03/2020   Intermittent diarrhea 07/03/2020   Dysphagia 02/19/2020   Schatzki's ring 02/19/2020   Right flank pain 02/19/2020   Constipation 02/19/2020   Family history of colonic polyps 02/19/2020   Spinal stenosis of cervical region 10/18/2019   Other spondylosis with radiculopathy, cervical region 08/09/2019   Vitamin D deficiency 03/23/2018   Hypertension    Anxiety    Hyperlipidemia    GERD (gastroesophageal reflux disease) 08/10/2014   HTN (hypertension) 01/25/2014   Chest pain 12/08/2013   Murmur 12/08/2013    PCP: Hoy Register, MD  REFERRING PROVIDER: Selmer Dominion, NP  REFERRING DIAG: R35.0 (ICD-10-CM) - Urinary frequency N39.3  (ICD-10-CM) - SUI (stress urinary incontinence, female) N81.10 (ICD-10-CM) - Prolapse of anterior vaginal wall  THERAPY DIAG:  Abnormal posture  Muscle weakness (generalized)  Other muscle spasm  Unspecified lack of coordination  Other low back pain  Rationale for Evaluation and Treatment: Rehabilitation  ONSET DATE: 10 years ago  SUBJECTIVE:                                                                                                                                                                                           SUBJECTIVE STATEMENT: Pt states that she is doing better  with bladder control in the last week.   PAIN:  Are you having pain? Yes NPRS scale: 3/10 Pain location:  low back pain, Rt   Pain type: aching Pain description: constant   Aggravating factors: prolonged sitting, prolonged standing, lying down in the bed in one position too long Relieving factors: ibuprofen, changing positions    PRECAUTIONS: None  RED FLAGS: None   WEIGHT BEARING RESTRICTIONS: No  FALLS:  Has patient fallen in last 6 months? No  LIVING ENVIRONMENT: Lives with: lives alone Lives in: House/apartment   OCCUPATION: does not work  PLOF: Independent  PATIENT GOALS: to see if this will help with leakage   PERTINENT HISTORY:  Abdominal hysterectomy, history uterine cancer, cholecystectomy   BOWEL MOVEMENT: Pain with bowel movement: No Type of bowel movement:Frequency every day to every other day and Strain Yes - sometimes Fully empty rectum: No Leakage: No - maybe with diarrhea  Pads: No Fiber supplement: No  URINATION: Pain with urination: No Fully empty bladder: Yes: - Stream: Strong Urgency: Yes: occasional urgency Frequency: at least 3x/hour, sometimes will wake up at night Leakage: Coughing, Sneezing, Laughing, and sitting down Pads: No, except when she has a cold   INTERCOURSE: Pain with intercourse:  no pain Ability to have vaginal penetration:   Yes: - Climax: states that clonipin makes it difficult Marinoff Scale: 0/3  PREGNANCY: Vaginal deliveries 2 Tearing Yes: episiotomy C-section deliveries 0 Currently pregnant No  PROLAPSE: No sensation of vaginal pressure    OBJECTIVE:  Note: Objective measures were completed at Evaluation unless otherwise noted.  03/16/23: COGNITION: Overall cognitive status: Within functional limits for tasks assessed     SENSATION: Light touch: Appears intact Proprioception: Appears intact  FUNCTIONAL TESTS:  Squat: Large Rt weight shift Single leg stance:  Rt: Lt pelvic drop  Lt:Rt pelvic drop  GAIT: Comments: WNL  POSTURE: rounded shoulders, increased lumbar lordosis, decreased thoracic kyphosis, and anterior pelvic tilt  PELVIC ALIGNMENT: Rt posterior rotation  LUMBARAROM/PROM:  A/PROM A/PROM  Eval (% available)  Flexion 75  Extension 25  Right lateral flexion 25, pain on Rt  Left lateral flexion 50  Right rotation  10  Left rotation 10   (Blank rows = not tested)   PALPATION:   General  Bil lumbar paraspinal restriction/high tone;                 External Perineal Exam some redness/dryness in vestibule and at posterior fourchette; tenderness over bladder externally                              Internal Pelvic Floor no tenderness, some increase in tone in Rt deep pelvic floor  Patient confirms identification and approves PT to assess internal pelvic floor and treatment Yes  PELVIC MMT:   MMT eval  Vaginal 3/5, 2 second hold, 6 repeat contractions   Diastasis Recti 2 finger width separation at umbilicus with distortion with curl-up test  (Blank rows = not tested)        TONE: Increase Rt deep layer  PROLAPSE: Grade 2 anterior vaginal wall laxity, grade 1 posterior vaginal wall laxity  TODAY'S TREATMENT:  DATE:   04/23/23 Neuromuscular re-education: Seated hip abduction green band with transversus abdominus and pelvic floor muscle 2 x 10 Seated resisted march green band with transversus abdominus and pelvic floor muscle 2 x 10 Seated resisted trunk rotation 2 x 10 bil green band Seated horizontal abduction 2 x 10 green band  Seated shoulder flexion 5lbs 2 x 10 Exercises: Seated piriformis stretch 60 sec bil Seated hamstring stretch 60 sec bil Therapeutic activities: Sit<>stand at table 10x Standing rows green band 2 x 10 Standing shoulder extension 2 x 10 red band   04/13/23 Neuromuscular re-education: Supine leg extensions with transversus abdominus and pelvic floor muscle 10x bil Supine resisted march with red band 2 x 10 Seated hip abduction red band with transversus abdominus and pelvic floor muscle 2 x 10 Seated resisted march red band with transversus abdominus and pelvic floor muscle 2 x 10 Seated resisted trunk rotation 2 x 10 bil red band Seated horizontal abduction 2 x 10 red band  Exercises: Supine piriformis stretch 60 seconds Lower trunk rotation 2 x 10  04/06/23 Neuromuscular re-education: Transversus abdominus training with multimodal cues for improved motor control and breath coordination  Transversus abdominus isometrics 10x Supine UE ball press with transversus abdominus and pelvic floor muscle 12x Supine hip adduction with transversus abdominus and pelvic floor muscle 12x Supine resisted march with opposite hand press 10 x bil Bridge with hip adduction, transversus abdominus, and pelvic floor muscle 2 x 10  Seated hip adduction ball press with transversus abdominus and pelvic floor muscle 2 x 10 add Seated hip adduction ball press with transversus abdominus and pelvic floor muscle 2 x 10  Seated resisted march red band with transversus abdominus and pelvic floor muscle 2 x 10     PATIENT EDUCATION:  Education details: [id number], urge drill, voiding  mechanics Person educated: Patient Education method: Explanation, Demonstration, Tactile cues, Verbal cues, and Handouts Education comprehension: verbalized understanding  HOME EXERCISE PROGRAM: [id number]  ASSESSMENT:  CLINICAL IMPRESSION: Pt starting to see some good improvement in urinary incontinence over the last week with reduced symptoms. She was able to progress seated exercises and start working towards more functional standing activities with good coordination of breathing and core contraction. Believe working towards these more functional activities will continue to help her see faster progress. She initially had difficulty with squat form, but was able to correct with broader base of support and verbal cues for knee to track out over feet. She will continue to benefit from skilled PT intervention in order to improve bladder control, improve abdominal pressure management, and begin/progress functional strengthening program.  OBJECTIVE IMPAIRMENTS: decreased activity tolerance, decreased coordination, decreased endurance, decreased strength, increased fascial restrictions, increased muscle spasms, impaired tone, postural dysfunction, and pain.   ACTIVITY LIMITATIONS: bending, sitting, standing, squatting, transfers, and continence  PARTICIPATION LIMITATIONS: community activity and daily activities  PERSONAL FACTORS: 1 comorbidity: medical history  are also affecting patient's functional outcome.   REHAB POTENTIAL: Good  CLINICAL DECISION MAKING: Stable/uncomplicated  EVALUATION COMPLEXITY: Low   GOALS: Goals reviewed with patient? Yes  SHORT TERM GOALS: Target date: 04/13/23 - updated 04/13/23  Pt will be independent with HEP.   Baseline: Goal status: MET 04/13/23  2.  Pt will increase pelvic floor muscle endurance to 6 second hold.  Baseline:  Goal status: IN PROGRESS 04/13/23  3.  Pt will be able to correctly perform diaphragmatic breathing and appropriate pressure  management in order to prevent worsening vaginal wall laxity and improve pelvic  floor A/ROM.   Baseline:  Goal status: IN PROGRESS 12/16/24AL   4.  Pt will be independent with the knack, urge suppression technique, and double voiding in order to improve bladder habits and decrease urinary incontinence.   Baseline:  Goal status: MET 04/13/23   LONG TERM GOALS: Target date: 08/31/2023 - updated 04/13/23  Pt will be independent with advanced HEP.   Baseline:  Goal status: IN PROGRESS 04/13/23  2.  Pt will demonstrate pelvic floor muscle contraction strength 4/5.  Baseline:  Goal status: IN PROGRESS 04/13/23  3.  Pt will demonstrate pelvic floor muscle endurance >10 seconds. Baseline:  Goal status: IN PROGRESS 04/13/23  4.  Pt will be able to stand on single LE without pelvic drop for >10 seconds in order to demonstrate good pelvic stability and core strength.  Baseline:  Goal status: IN PROGRESS 04/13/23  5.  Pt will report no leaks with laughing, coughing, sneezing in order to improve comfort with interpersonal relationships and community activities.   Baseline:  Goal status: IN PROGRESS 04/13/23  6.  Pt will be able to go 2-3 hours in between voids without urgency or incontinence in order to improve QOL and perform all functional activities with less difficulty.   Baseline:  Goal status: IN PROGRESS 04/13/23  PLAN:  PT FREQUENCY: 1-2x/week  PT DURATION: 6 months  PLANNED INTERVENTIONS: 97110-Therapeutic exercises, 97530- Therapeutic activity, 97112- Neuromuscular re-education, 97535- Self Care, 52841- Manual therapy, Dry Needling, and Biofeedback  PLAN FOR NEXT SESSION: Progress cores strengthening   Julio Alm, PT, DPT12/26/2411:43 AM

## 2023-04-27 ENCOUNTER — Ambulatory Visit: Payer: Medicaid Other

## 2023-04-27 DIAGNOSIS — M5459 Other low back pain: Secondary | ICD-10-CM

## 2023-04-27 DIAGNOSIS — M62838 Other muscle spasm: Secondary | ICD-10-CM

## 2023-04-27 DIAGNOSIS — R293 Abnormal posture: Secondary | ICD-10-CM

## 2023-04-27 DIAGNOSIS — M6281 Muscle weakness (generalized): Secondary | ICD-10-CM

## 2023-04-27 DIAGNOSIS — R279 Unspecified lack of coordination: Secondary | ICD-10-CM

## 2023-04-27 NOTE — Therapy (Signed)
OUTPATIENT PHYSICAL THERAPY FEMALE PELVIC Treatment   Patient Name: Christina Burke MRN: [id number] DOB:02/23/1961, 62 y.o., female Today's Date: 04/27/2023  END OF SESSION:  PT End of Session - 04/27/23 1614     Visit Number 6    Date for PT Re-Evaluation 08/31/23    Authorization Type [employer]    PT Start Time 1615    PT Stop Time 1655    PT Time Calculation (min) 40 min    Activity Tolerance Patient tolerated treatment well    Behavior During Therapy [facility] for tasks assessed/performed              Past Medical History:  Diagnosis Date   Anxiety    Cancer (HCC)    pt. reports had uterine cancer x 10 years ago   Chest pain 12/08/2013   Gallbladder stone with nonacute cholecystitis    GERD (gastroesophageal reflux disease) 08/10/2014   Hyperlipidemia    Hypertension    Murmur 12/08/2013   Past Surgical History:  Procedure Laterality Date   ABDOMINAL HYSTERECTOMY     ABDOMINAL SURGERY     COLONOSCOPY     UPPER GASTROINTESTINAL ENDOSCOPY  4/21/216   Patient Active Problem List   Diagnosis Date Noted   Lichen sclerosus 09/27/2021   Type 2 diabetes mellitus (HCC) 09/27/2021   Atherosclerotic cardiovascular disease 02/05/2021   Calculus of gallbladder without cholecystitis without obstruction 07/03/2020   Elevated lipase 07/03/2020   Intermittent diarrhea 07/03/2020   Dysphagia 02/19/2020   Schatzki's ring 02/19/2020   Right flank pain 02/19/2020   Constipation 02/19/2020   Family history of colonic polyps 02/19/2020   Spinal stenosis of cervical region 10/18/2019   Other spondylosis with radiculopathy, cervical region 08/09/2019   Vitamin D deficiency 03/23/2018   Hypertension    Anxiety    Hyperlipidemia    GERD (gastroesophageal reflux disease) 08/10/2014   HTN (hypertension) 01/25/2014   Chest pain 12/08/2013   Murmur 12/08/2013    PCP: Hoy Register, MD  REFERRING PROVIDER: Selmer Dominion, NP  REFERRING DIAG: R35.0  (ICD-10-CM) - Urinary frequency N39.3 (ICD-10-CM) - SUI (stress urinary incontinence, female) N81.10 (ICD-10-CM) - Prolapse of anterior vaginal wall  THERAPY DIAG:  Abnormal posture  Muscle weakness (generalized)  Other muscle spasm  Unspecified lack of coordination  Other low back pain  Rationale for Evaluation and Treatment: Rehabilitation  ONSET DATE: 10 years ago  SUBJECTIVE:                                                                                                                                                                                           SUBJECTIVE  STATEMENT: Pt states that bladder continues to improve. She had a little more low back tightness after walking around [employer] for a long time.   PAIN:  Are you having pain? Yes NPRS scale: 7/10 Pain location:  low back pain, Rt   Pain type: aching Pain description: constant   Aggravating factors: prolonged sitting, prolonged standing, lying down in the bed in one position too long Relieving factors: ibuprofen, changing positions    PRECAUTIONS: None  RED FLAGS: None   WEIGHT BEARING RESTRICTIONS: No  FALLS:  Has patient fallen in last 6 months? No  LIVING ENVIRONMENT: Lives with: lives alone Lives in: House/apartment   OCCUPATION: does not work  PLOF: Independent  PATIENT GOALS: to see if this will help with leakage   PERTINENT HISTORY:  Abdominal hysterectomy, history uterine cancer, cholecystectomy   BOWEL MOVEMENT: Pain with bowel movement: No Type of bowel movement:Frequency every day to every other day and Strain Yes - sometimes Fully empty rectum: No Leakage: No - maybe with diarrhea  Pads: No Fiber supplement: No  URINATION: Pain with urination: No Fully empty bladder: Yes: - Stream: Strong Urgency: Yes: occasional urgency Frequency: at least 3x/hour, sometimes will wake up at night Leakage: Coughing, Sneezing, Laughing, and sitting down Pads: No, except when she has  a cold   INTERCOURSE: Pain with intercourse:  no pain Ability to have vaginal penetration:  Yes: - Climax: states that clonipin makes it difficult Marinoff Scale: 0/3  PREGNANCY: Vaginal deliveries 2 Tearing Yes: episiotomy C-section deliveries 0 Currently pregnant No  PROLAPSE: No sensation of vaginal pressure    OBJECTIVE:  Note: Objective measures were completed at Evaluation unless otherwise noted.  03/16/23: COGNITION: Overall cognitive status: Within functional limits for tasks assessed     SENSATION: Light touch: Appears intact Proprioception: Appears intact  FUNCTIONAL TESTS:  Squat: Large Rt weight shift Single leg stance:  Rt: Lt pelvic drop  Lt:Rt pelvic drop  GAIT: Comments: WNL  POSTURE: rounded shoulders, increased lumbar lordosis, decreased thoracic kyphosis, and anterior pelvic tilt  PELVIC ALIGNMENT: Rt posterior rotation  LUMBARAROM/PROM:  A/PROM A/PROM  Eval (% available)  Flexion 75  Extension 25  Right lateral flexion 25, pain on Rt  Left lateral flexion 50  Right rotation  10  Left rotation 10   (Blank rows = not tested)   PALPATION:   General  Bil lumbar paraspinal restriction/high tone;                 External Perineal Exam some redness/dryness in vestibule and at posterior fourchette; tenderness over bladder externally                              Internal Pelvic Floor no tenderness, some increase in tone in Rt deep pelvic floor  Patient confirms identification and approves PT to assess internal pelvic floor and treatment Yes  PELVIC MMT:   MMT eval  Vaginal 3/5, 2 second hold, 6 repeat contractions   Diastasis Recti 2 finger width separation at umbilicus with distortion with curl-up test  (Blank rows = not tested)        TONE: Increase Rt deep layer  PROLAPSE: Grade 2 anterior vaginal wall laxity, grade 1 posterior vaginal wall laxity  TODAY'S TREATMENT:  DATE:  04/27/23 Neuromuscular re-education: Bridge with hip adduction, transversus abdominus, and pelvic floor muscle 2 x 10 Propped sideling with unilateral horizontal abduction 10x 3lbs bil  Seated unilateral hip abduction green band 10x bil Seated resisted march red band with transversus abdominus and pelvic floor muscle 2 x 10  Exercises: Single knee to chest 5x bil Lower trunk rotation 2 x 10 Open books 10x bil Clam shells 2 x 10 bil   04/23/23 Neuromuscular re-education: Seated hip abduction green band with transversus abdominus and pelvic floor muscle 2 x 10 Seated resisted march green band with transversus abdominus and pelvic floor muscle 2 x 10 Seated resisted trunk rotation 2 x 10 bil green band Seated horizontal abduction 2 x 10 green band  Seated shoulder flexion 5lbs 2 x 10 Exercises: Seated piriformis stretch 60 sec bil Seated hamstring stretch 60 sec bil Therapeutic activities: Sit<>stand at table 10x Standing rows green band 2 x 10 Standing shoulder extension 2 x 10 red band   04/13/23 Neuromuscular re-education: Supine leg extensions with transversus abdominus and pelvic floor muscle 10x bil Supine resisted march with red band 2 x 10 Seated hip abduction red band with transversus abdominus and pelvic floor muscle 2 x 10 Seated resisted march red band with transversus abdominus and pelvic floor muscle 2 x 10 Seated resisted trunk rotation 2 x 10 bil red band Seated horizontal abduction 2 x 10 red band  Exercises: Supine piriformis stretch 60 seconds Lower trunk rotation 2 x 10  PATIENT EDUCATION:  Education details: [id number], urge drill, voiding mechanics Person educated: Patient Education method: Explanation, Demonstration, Tactile cues, Verbal cues, and Handouts Education comprehension: verbalized understanding  HOME EXERCISE PROGRAM: [id number]  ASSESSMENT:  CLINICAL IMPRESSION: Pt  doing very well overall with improved bladder control. Her low back was bothering her more today after prolonged standing and walking at the store. She did very well starting with a few stretches to help low back tension reduce and then was able to work on exercises. At end of session she reported 0/10 low back pain. She is doing very well with understnaind how to coordinate core and pelvic floor muscle contractions into exercises for improved stability. She will continue to benefit from skilled PT intervention in order to improve bladder control, improve abdominal pressure management, and begin/progress functional strengthening program.  OBJECTIVE IMPAIRMENTS: decreased activity tolerance, decreased coordination, decreased endurance, decreased strength, increased fascial restrictions, increased muscle spasms, impaired tone, postural dysfunction, and pain.   ACTIVITY LIMITATIONS: bending, sitting, standing, squatting, transfers, and continence  PARTICIPATION LIMITATIONS: community activity and daily activities  PERSONAL FACTORS: 1 comorbidity: medical history  are also affecting patient's functional outcome.   REHAB POTENTIAL: Good  CLINICAL DECISION MAKING: Stable/uncomplicated  EVALUATION COMPLEXITY: Low   GOALS: Goals reviewed with patient? Yes  SHORT TERM GOALS: Target date: 04/13/23 - updated 04/13/23  Pt will be independent with HEP.   Baseline: Goal status: MET 04/13/23  2.  Pt will increase pelvic floor muscle endurance to 6 second hold.  Baseline:  Goal status: IN PROGRESS 04/13/23  3.  Pt will be able to correctly perform diaphragmatic breathing and appropriate pressure management in order to prevent worsening vaginal wall laxity and improve pelvic floor A/ROM.   Baseline:  Goal status: IN PROGRESS 12/16/24AL   4.  Pt will be independent with the knack, urge suppression technique, and double voiding in order to improve bladder habits and decrease urinary incontinence.    Baseline:  Goal status: MET 04/13/23  LONG TERM GOALS: Target date: 08/31/2023 - updated 04/13/23  Pt will be independent with advanced HEP.   Baseline:  Goal status: IN PROGRESS 04/13/23  2.  Pt will demonstrate pelvic floor muscle contraction strength 4/5.  Baseline:  Goal status: IN PROGRESS 04/13/23  3.  Pt will demonstrate pelvic floor muscle endurance >10 seconds. Baseline:  Goal status: IN PROGRESS 04/13/23  4.  Pt will be able to stand on single LE without pelvic drop for >10 seconds in order to demonstrate good pelvic stability and core strength.  Baseline:  Goal status: IN PROGRESS 04/13/23  5.  Pt will report no leaks with laughing, coughing, sneezing in order to improve comfort with interpersonal relationships and community activities.   Baseline:  Goal status: IN PROGRESS 04/13/23  6.  Pt will be able to go 2-3 hours in between voids without urgency or incontinence in order to improve QOL and perform all functional activities with less difficulty.   Baseline:  Goal status: IN PROGRESS 04/13/23  PLAN:  PT FREQUENCY: 1-2x/week  PT DURATION: 6 months  PLANNED INTERVENTIONS: 97110-Therapeutic exercises, 97530- Therapeutic activity, 97112- Neuromuscular re-education, 97535- Self Care, [id number]- Manual therapy, Dry Needling, and Biofeedback  PLAN FOR NEXT SESSION: Progress cores strengthening   Julio Alm, PT, DPT12/30/244:56 PM

## 2023-05-05 ENCOUNTER — Ambulatory Visit: Payer: Medicaid Other | Attending: Obstetrics and Gynecology

## 2023-05-05 DIAGNOSIS — R279 Unspecified lack of coordination: Secondary | ICD-10-CM | POA: Insufficient documentation

## 2023-05-05 DIAGNOSIS — R293 Abnormal posture: Secondary | ICD-10-CM | POA: Insufficient documentation

## 2023-05-05 DIAGNOSIS — M62838 Other muscle spasm: Secondary | ICD-10-CM | POA: Insufficient documentation

## 2023-05-05 DIAGNOSIS — M6281 Muscle weakness (generalized): Secondary | ICD-10-CM | POA: Insufficient documentation

## 2023-05-05 DIAGNOSIS — M5459 Other low back pain: Secondary | ICD-10-CM | POA: Diagnosis present

## 2023-05-05 NOTE — Therapy (Signed)
 OUTPATIENT PHYSICAL THERAPY FEMALE PELVIC Treatment   Patient Name: Christina Burke MRN: [id number] DOB:07-03-1960, 63 y.o., female Today's Date: 05/05/2023  END OF SESSION:  PT End of Session - 05/05/23 0845     Visit Number 7    Date for PT Re-Evaluation 08/31/23    Authorization Type [employer]    PT Start Time 0845    PT Stop Time 0925    PT Time Calculation (min) 40 min    Activity Tolerance Patient tolerated treatment well    Behavior During Therapy [facility] for tasks assessed/performed              Past Medical History:  Diagnosis Date   Anxiety    Cancer (HCC)    pt. reports had uterine cancer x 10 years ago   Chest pain 12/08/2013   Gallbladder stone with nonacute cholecystitis    GERD (gastroesophageal reflux disease) 08/10/2014   Hyperlipidemia    Hypertension    Murmur 12/08/2013   Past Surgical History:  Procedure Laterality Date   ABDOMINAL HYSTERECTOMY     ABDOMINAL SURGERY     COLONOSCOPY     UPPER GASTROINTESTINAL ENDOSCOPY  4/21/216   Patient Active Problem List   Diagnosis Date Noted   Lichen sclerosus 09/27/2021   Type 2 diabetes mellitus (HCC) 09/27/2021   Atherosclerotic cardiovascular disease 02/05/2021   Calculus of gallbladder without cholecystitis without obstruction 07/03/2020   Elevated lipase 07/03/2020   Intermittent diarrhea 07/03/2020   Dysphagia 02/19/2020   Schatzki's ring 02/19/2020   Right flank pain 02/19/2020   Constipation 02/19/2020   Family history of colonic polyps 02/19/2020   Spinal stenosis of cervical region 10/18/2019   Other spondylosis with radiculopathy, cervical region 08/09/2019   Vitamin D  deficiency 03/23/2018   Hypertension    Anxiety    Hyperlipidemia    GERD (gastroesophageal reflux disease) 08/10/2014   HTN (hypertension) 01/25/2014   Chest pain 12/08/2013   Murmur 12/08/2013    PCP: Delbert Clam, MD  REFERRING PROVIDER: Zuleta, Kaitlin G, NP  REFERRING DIAG: R35.0 (ICD-10-CM)  - Urinary frequency N39.3 (ICD-10-CM) - SUI (stress urinary incontinence, female) N81.10 (ICD-10-CM) - Prolapse of anterior vaginal wall  THERAPY DIAG:  Abnormal posture  Muscle weakness (generalized)  Other muscle spasm  Unspecified lack of coordination  Other low back pain  Rationale for Evaluation and Treatment: Rehabilitation  ONSET DATE: 10 years ago  SUBJECTIVE:                                                                                                                                                                                           SUBJECTIVE  STATEMENT: Pt states that bladder continues to improve. She states that low back was tight this morning and exercises were helpful.   PAIN:  Are you having pain? Yes NPRS scale: 5/10 Pain location:  low back pain, Rt   Pain type: aching Pain description: constant   Aggravating factors: prolonged sitting, prolonged standing, lying down in the bed in one position too long Relieving factors: ibuprofen, changing positions    PRECAUTIONS: None  RED FLAGS: None   WEIGHT BEARING RESTRICTIONS: No  FALLS:  Has patient fallen in last 6 months? No  LIVING ENVIRONMENT: Lives with: lives alone Lives in: House/apartment   OCCUPATION: does not work  PLOF: Independent  PATIENT GOALS: to see if this will help with leakage   PERTINENT HISTORY:  Abdominal hysterectomy, history uterine cancer, cholecystectomy   BOWEL MOVEMENT: Pain with bowel movement: No Type of bowel movement:Frequency every day to every other day and Strain Yes - sometimes Fully empty rectum: No Leakage: No - maybe with diarrhea  Pads: No Fiber supplement: No  URINATION: Pain with urination: No Fully empty bladder: Yes: - Stream: Strong Urgency: Yes: occasional urgency Frequency: at least 3x/hour, sometimes will wake up at night Leakage: Coughing, Sneezing, Laughing, and sitting down Pads: No, except when she has a cold    INTERCOURSE: Pain with intercourse:  no pain Ability to have vaginal penetration:  Yes: - Climax: states that clonipin makes it difficult Marinoff Scale: 0/3  PREGNANCY: Vaginal deliveries 2 Tearing Yes: episiotomy C-section deliveries 0 Currently pregnant No  PROLAPSE: No sensation of vaginal pressure    OBJECTIVE:  Note: Objective measures were completed at Evaluation unless otherwise noted.  03/16/23: COGNITION: Overall cognitive status: Within functional limits for tasks assessed     SENSATION: Light touch: Appears intact Proprioception: Appears intact  FUNCTIONAL TESTS:  Squat: Large Rt weight shift Single leg stance:  Rt: Lt pelvic drop  Lt:Rt pelvic drop  GAIT: Comments: WNL  POSTURE: rounded shoulders, increased lumbar lordosis, decreased thoracic kyphosis, and anterior pelvic tilt  PELVIC ALIGNMENT: Rt posterior rotation  LUMBARAROM/PROM:  A/PROM A/PROM  Eval (% available)  Flexion 75  Extension 25  Right lateral flexion 25, pain on Rt  Left lateral flexion 50  Right rotation  10  Left rotation 10   (Blank rows = not tested)   PALPATION:   General  Bil lumbar paraspinal restriction/high tone;                 External Perineal Exam some redness/dryness in vestibule and at posterior fourchette; tenderness over bladder externally                              Internal Pelvic Floor no tenderness, some increase in tone in Rt deep pelvic floor  Patient confirms identification and approves PT to assess internal pelvic floor and treatment Yes  PELVIC MMT:   MMT eval  Vaginal 3/5, 2 second hold, 6 repeat contractions   Diastasis Recti 2 finger width separation at umbilicus with distortion with curl-up test  (Blank rows = not tested)        TONE: Increase Rt deep layer  PROLAPSE: Grade 2 anterior vaginal wall laxity, grade 1 posterior vaginal wall laxity  TODAY'S TREATMENT:  DATE:  05/05/23 Neuromuscular re-education: Seated unilateral hip abduction green band 2 x 10 bil Seated resisted march green band with transversus abdominus and pelvic floor muscle 2 x 10  Seated bil UE flexion to shoulder level 2 x 10 5lbs  Standing march with anterior weight hold 3 lbs 3 x 10 Standing chop 3 lbs 2 x 10 bil  Standing bil UE extensions 2 x 10 red band  Therapeutic activities: Squats to table 2 x 10 3-way kick 10x each, bil    04/27/23 Neuromuscular re-education: Bridge with hip adduction, transversus abdominus, and pelvic floor muscle 2 x 10 Propped sideling with unilateral horizontal abduction 10x 3lbs bil  Seated unilateral hip abduction green band 10x bil Seated resisted march red band with transversus abdominus and pelvic floor muscle 2 x 10  Exercises: Single knee to chest 5x bil Lower trunk rotation 2 x 10 Open books 10x bil Clam shells 2 x 10 bil   04/23/23 Neuromuscular re-education: Seated hip abduction green band with transversus abdominus and pelvic floor muscle 2 x 10 Seated resisted march green band with transversus abdominus and pelvic floor muscle 2 x 10 Seated resisted trunk rotation 2 x 10 bil green band Seated horizontal abduction 2 x 10 green band  Seated shoulder flexion 5lbs 2 x 10 Exercises: Seated piriformis stretch 60 sec bil Seated hamstring stretch 60 sec bil Therapeutic activities: Sit<>stand at table 10x Standing rows green band 2 x 10 Standing shoulder extension 2 x 10 red band   PATIENT EDUCATION:  Education details: [id number], urge drill, voiding mechanics Person educated: Patient Education method: Explanation, Demonstration, Tactile cues, Verbal cues, and Handouts Education comprehension: verbalized understanding  HOME EXERCISE PROGRAM: [id number]  ASSESSMENT:  CLINICAL IMPRESSION: Pt continues to make progress with bladder control. She was able to progress  seated exercises today and begin working on standing activities with appropriate challenge. She had some difficulty with breathing, but was able to perform correct core and pelvic floor muscle activation with all exercises. Continuing to progress core strengthening activities into more functional positions will help continue to improve bladder control in various activities. As she is making good progress, we started discussing discharge planning. She will continue to benefit from skilled PT intervention in order to improve bladder control, improve abdominal pressure management, and begin/progress functional strengthening program.  OBJECTIVE IMPAIRMENTS: decreased activity tolerance, decreased coordination, decreased endurance, decreased strength, increased fascial restrictions, increased muscle spasms, impaired tone, postural dysfunction, and pain.   ACTIVITY LIMITATIONS: bending, sitting, standing, squatting, transfers, and continence  PARTICIPATION LIMITATIONS: community activity and daily activities  PERSONAL FACTORS: 1 comorbidity: medical history  are also affecting patient's functional outcome.   REHAB POTENTIAL: Good  CLINICAL DECISION MAKING: Stable/uncomplicated  EVALUATION COMPLEXITY: Low   GOALS: Goals reviewed with patient? Yes  SHORT TERM GOALS: Target date: 04/13/23 - updated 04/13/23  Pt will be independent with HEP.   Baseline: Goal status: MET 04/13/23  2.  Pt will increase pelvic floor muscle endurance to 6 second hold.  Baseline:  Goal status: IN PROGRESS 04/13/23  3.  Pt will be able to correctly perform diaphragmatic breathing and appropriate pressure management in order to prevent worsening vaginal wall laxity and improve pelvic floor A/ROM.   Baseline:  Goal status: IN PROGRESS 12/16/24AL   4.  Pt will be independent with the knack, urge suppression technique, and double voiding in order to improve bladder habits and decrease urinary incontinence.    Baseline:  Goal status: MET 04/13/23   LONG  TERM GOALS: Target date: 08/31/2023 - updated 04/13/23  Pt will be independent with advanced HEP.   Baseline:  Goal status: IN PROGRESS 04/13/23  2.  Pt will demonstrate pelvic floor muscle contraction strength 4/5.  Baseline:  Goal status: IN PROGRESS 04/13/23  3.  Pt will demonstrate pelvic floor muscle endurance >10 seconds. Baseline:  Goal status: IN PROGRESS 04/13/23  4.  Pt will be able to stand on single LE without pelvic drop for >10 seconds in order to demonstrate good pelvic stability and core strength.  Baseline:  Goal status: IN PROGRESS 04/13/23  5.  Pt will report no leaks with laughing, coughing, sneezing in order to improve comfort with interpersonal relationships and community activities.   Baseline:  Goal status: IN PROGRESS 04/13/23  6.  Pt will be able to go 2-3 hours in between voids without urgency or incontinence in order to improve QOL and perform all functional activities with less difficulty.   Baseline:  Goal status: IN PROGRESS 04/13/23  PLAN:  PT FREQUENCY: 1-2x/week  PT DURATION: 6 months  PLANNED INTERVENTIONS: 97110-Therapeutic exercises, 97530- Therapeutic activity, 97112- Neuromuscular re-education, 97535- Self Care, [id number]- Manual therapy, Dry Needling, and Biofeedback  PLAN FOR NEXT SESSION: Progress cores strengthening   Josette Mares, PT, DPT01/07/259:25 AM

## 2023-05-13 ENCOUNTER — Ambulatory Visit: Payer: Medicaid Other

## 2023-05-13 DIAGNOSIS — R293 Abnormal posture: Secondary | ICD-10-CM

## 2023-05-13 DIAGNOSIS — M6281 Muscle weakness (generalized): Secondary | ICD-10-CM

## 2023-05-13 DIAGNOSIS — M62838 Other muscle spasm: Secondary | ICD-10-CM

## 2023-05-13 DIAGNOSIS — R279 Unspecified lack of coordination: Secondary | ICD-10-CM

## 2023-05-13 DIAGNOSIS — M5459 Other low back pain: Secondary | ICD-10-CM

## 2023-05-13 NOTE — Therapy (Signed)
 OUTPATIENT PHYSICAL THERAPY FEMALE PELVIC Treatment   Patient Name: Christina Burke MRN: [id number] DOB:June 16, 1960, 63 y.o., female Today's Date: 05/13/2023  END OF SESSION:  PT End of Session - 05/13/23 1540     Visit Number 8    Date for PT Re-Evaluation 08/31/23    Authorization Type [employer]    PT Start Time 1540    PT Stop Time 1613    PT Time Calculation (min) 33 min    Activity Tolerance Patient tolerated treatment well    Behavior During Therapy WFL for tasks assessed/performed              Past Medical History:  Diagnosis Date   Anxiety    Cancer (HCC)    pt. reports had uterine cancer x 10 years ago   Chest pain 12/08/2013   Gallbladder stone with nonacute cholecystitis    GERD (gastroesophageal reflux disease) 08/10/2014   Hyperlipidemia    Hypertension    Murmur 12/08/2013   Past Surgical History:  Procedure Laterality Date   ABDOMINAL HYSTERECTOMY     ABDOMINAL SURGERY     COLONOSCOPY     UPPER GASTROINTESTINAL ENDOSCOPY  4/21/216   Patient Active Problem List   Diagnosis Date Noted   Lichen sclerosus 09/27/2021   Type 2 diabetes mellitus (HCC) 09/27/2021   Atherosclerotic cardiovascular disease 02/05/2021   Calculus of gallbladder without cholecystitis without obstruction 07/03/2020   Elevated lipase 07/03/2020   Intermittent diarrhea 07/03/2020   Dysphagia 02/19/2020   Schatzki's ring 02/19/2020   Right flank pain 02/19/2020   Constipation 02/19/2020   Family history of colonic polyps 02/19/2020   Spinal stenosis of cervical region 10/18/2019   Other spondylosis with radiculopathy, cervical region 08/09/2019   Vitamin D  deficiency 03/23/2018   Hypertension    Anxiety    Hyperlipidemia    GERD (gastroesophageal reflux disease) 08/10/2014   HTN (hypertension) 01/25/2014   Chest pain 12/08/2013   Murmur 12/08/2013    PCP: Joaquin Mulberry, MD  REFERRING PROVIDER: Zuleta, Kaitlin G, NP  REFERRING DIAG: R35.0 (ICD-10-CM)  - Urinary frequency N39.3 (ICD-10-CM) - SUI (stress urinary incontinence, female) N81.10 (ICD-10-CM) - Prolapse of anterior vaginal wall  THERAPY DIAG:  Abnormal posture  Muscle weakness (generalized)  Other muscle spasm  Unspecified lack of coordination  Other low back pain  Rationale for Evaluation and Treatment: Rehabilitation  ONSET DATE: 10 years ago  SUBJECTIVE:                                                                                                                                                                                           SUBJECTIVE  STATEMENT: Pt states that she is feeling much stronger in her pelvic floor. She is unsure if her leaking is getting better. She knows for sure that she is leaking with getting up from a chair/couch, coughing, sneezing.  PAIN:  Are you having pain? Yes NPRS scale: 0/10 Pain location:  low back pain, Rt   Pain type: aching Pain description: constant   Aggravating factors: prolonged sitting, prolonged standing, lying down in the bed in one position too long Relieving factors: ibuprofen, changing positions    PRECAUTIONS: None  RED FLAGS: None   WEIGHT BEARING RESTRICTIONS: No  FALLS:  Has patient fallen in last 6 months? No  LIVING ENVIRONMENT: Lives with: lives alone Lives in: House/apartment   OCCUPATION: does not work  PLOF: Independent  PATIENT GOALS: to see if this will help with leakage   PERTINENT HISTORY:  Abdominal hysterectomy, history uterine cancer, cholecystectomy   BOWEL MOVEMENT: Pain with bowel movement: No Type of bowel movement:Frequency every day to every other day and Strain Yes - sometimes Fully empty rectum: No Leakage: No - maybe with diarrhea  Pads: No Fiber supplement: No  URINATION: Pain with urination: No Fully empty bladder: Yes: - Stream: Strong Urgency: Yes: occasional urgency Frequency: at least 3x/hour, sometimes will wake up at night Leakage: Coughing,  Sneezing, Laughing, and sitting down Pads: No, except when she has a cold   INTERCOURSE: Pain with intercourse:  no pain Ability to have vaginal penetration:  Yes: - Climax: states that clonipin makes it difficult Marinoff Scale: 0/3  PREGNANCY: Vaginal deliveries 2 Tearing Yes: episiotomy C-section deliveries 0 Currently pregnant No  PROLAPSE: No sensation of vaginal pressure    OBJECTIVE:  Note: Objective measures were completed at Evaluation unless otherwise noted.  03/16/23: COGNITION: Overall cognitive status: Within functional limits for tasks assessed     SENSATION: Light touch: Appears intact Proprioception: Appears intact  FUNCTIONAL TESTS:  Squat: Large Rt weight shift Single leg stance:  Rt: Lt pelvic drop  Lt:Rt pelvic drop  GAIT: Comments: WNL  POSTURE: rounded shoulders, increased lumbar lordosis, decreased thoracic kyphosis, and anterior pelvic tilt  PELVIC ALIGNMENT: Rt posterior rotation  LUMBARAROM/PROM:  A/PROM A/PROM  Eval (% available)  Flexion 75  Extension 25  Right lateral flexion 25, pain on Rt  Left lateral flexion 50  Right rotation  10  Left rotation 10   (Blank rows = not tested)   PALPATION:   General  Bil lumbar paraspinal restriction/high tone;                 External Perineal Exam some redness/dryness in vestibule and at posterior fourchette; tenderness over bladder externally                              Internal Pelvic Floor no tenderness, some increase in tone in Rt deep pelvic floor  Patient confirms identification and approves PT to assess internal pelvic floor and treatment Yes  PELVIC MMT:   MMT eval  Vaginal 3/5, 2 second hold, 6 repeat contractions   Diastasis Recti 2 finger width separation at umbilicus with distortion with curl-up test  (Blank rows = not tested)        TONE: Increase Rt deep layer  PROLAPSE: Grade 2 anterior vaginal wall laxity, grade 1 posterior vaginal wall laxity  TODAY'S  TREATMENT:  DATE:  05/13/23 Neuromuscular re-education: Supine modified dead bug 10x bil Swiss ball supine chop 3 lbs weight 10x bil Supine posterior pelvic tilts 2 x 10 Therapeutic activities: The knack Sit<>stand with pelvic floor muscle contraction while rising with exhale Lower trunk rotation in supine with LE supported on red ball to help improve breath/pelvic floor muscle coordination in bed mobility   05/05/23 Neuromuscular re-education: Seated unilateral hip abduction green band 2 x 10 bil Seated resisted march green band with transversus abdominus and pelvic floor muscle 2 x 10  Seated bil UE flexion to shoulder level 2 x 10 5lbs  Standing march with anterior weight hold 3 lbs 3 x 10 Standing chop 3 lbs 2 x 10 bil  Standing bil UE extensions 2 x 10 red band  Therapeutic activities: Squats to table 2 x 10 3-way kick 10x each, bil    04/27/23 Neuromuscular re-education: Bridge with hip adduction, transversus abdominus, and pelvic floor muscle 2 x 10 Propped sideling with unilateral horizontal abduction 10x 3lbs bil  Seated unilateral hip abduction green band 10x bil Seated resisted march red band with transversus abdominus and pelvic floor muscle 2 x 10  Exercises: Single knee to chest 5x bil Lower trunk rotation 2 x 10 Open books 10x bil Clam shells 2 x 10 bil    PATIENT EDUCATION:  Education details: [id number], urge drill, voiding mechanics Person educated: Patient Education method: Explanation, Demonstration, Tactile cues, Verbal cues, and Handouts Education comprehension: verbalized understanding  HOME EXERCISE PROGRAM: [id number]  ASSESSMENT:  CLINICAL IMPRESSION: Pt states today that she feels like pelvic floor muscles are stronger, but she does not feel like urinary incontinence is any different. All of the times she can report having  episodes of incontinence involve increased abdominal pressure and breath holding. We practiced exercises that help to mimic rolling in bed and performing sit<>stands. We also went over the knack and how to perform in order to reduce stress incontinence with coughing and sneezing. She will continue to benefit from skilled PT intervention in order to improve bladder control, improve abdominal pressure management, and begin/progress functional strengthening program.  OBJECTIVE IMPAIRMENTS: decreased activity tolerance, decreased coordination, decreased endurance, decreased strength, increased fascial restrictions, increased muscle spasms, impaired tone, postural dysfunction, and pain.   ACTIVITY LIMITATIONS: bending, sitting, standing, squatting, transfers, and continence  PARTICIPATION LIMITATIONS: community activity and daily activities  PERSONAL FACTORS: 1 comorbidity: medical history  are also affecting patient's functional outcome.   REHAB POTENTIAL: Good  CLINICAL DECISION MAKING: Stable/uncomplicated  EVALUATION COMPLEXITY: Low   GOALS: Goals reviewed with patient? Yes  SHORT TERM GOALS: Target date: 04/13/23 - updated 04/13/23 - updated 05/13/23  Pt will be independent with HEP.   Baseline: Goal status: MET 04/13/23  2.  Pt will increase pelvic floor muscle endurance to 6 second hold.  Baseline:  Goal status: IN PROGRESS 04/13/23  3.  Pt will be able to correctly perform diaphragmatic breathing and appropriate pressure management in order to prevent worsening vaginal wall laxity and improve pelvic floor A/ROM.   Baseline: Pt still having trouble with this during bed mobility and exercises Goal status: IN PROGRESS 05/13/23   4.  Pt will be independent with the knack, urge suppression technique, and double voiding in order to improve bladder habits and decrease urinary incontinence.   Baseline:  Goal status: MET 04/13/23   LONG TERM GOALS: Target date: 08/31/2023 - updated  04/13/23 - updated 05/13/23  Pt will be independent with advanced HEP.   Baseline:  Goal status: IN PROGRESS 05/13/23  2.  Pt will demonstrate pelvic floor muscle contraction strength 4/5.  Baseline:  Goal status: IN PROGRESS 04/13/23  3.  Pt will demonstrate pelvic floor muscle endurance >10 seconds. Baseline:  Goal status: IN PROGRESS 04/13/23  4.  Pt will be able to stand on single LE without pelvic drop for >10 seconds in order to demonstrate good pelvic stability and core strength.  Baseline:  Goal status: IN PROGRESS 04/13/23  5.  Pt will report no leaks with laughing, coughing, sneezing in order to improve comfort with interpersonal relationships and community activities.   Baseline: Pt reports no progress with this Goal status: IN PROGRESS 05/13/23  6.  Pt will be able to go 2-3 hours in between voids without urgency or incontinence in order to improve QOL and perform all functional activities with less difficulty.   Baseline: Improving Goal status: IN PROGRESS 05/13/23  PLAN:  PT FREQUENCY: 1-2x/week  PT DURATION: 6 months  PLANNED INTERVENTIONS: 97110-Therapeutic exercises, 97530- Therapeutic activity, 97112- Neuromuscular re-education, 97535- Self Care, 09811- Manual therapy, Dry Needling, and Biofeedback  PLAN FOR NEXT SESSION: Progress cores strengthening   Verlena Glenn, PT, DPT01/15/254:10 PM

## 2023-05-27 ENCOUNTER — Ambulatory Visit: Payer: Medicaid Other

## 2023-06-02 ENCOUNTER — Ambulatory Visit: Payer: Medicaid Other | Attending: Obstetrics and Gynecology

## 2023-06-02 DIAGNOSIS — M5459 Other low back pain: Secondary | ICD-10-CM | POA: Diagnosis present

## 2023-06-02 DIAGNOSIS — M62838 Other muscle spasm: Secondary | ICD-10-CM | POA: Diagnosis present

## 2023-06-02 DIAGNOSIS — M6281 Muscle weakness (generalized): Secondary | ICD-10-CM | POA: Insufficient documentation

## 2023-06-02 DIAGNOSIS — R293 Abnormal posture: Secondary | ICD-10-CM | POA: Diagnosis present

## 2023-06-02 DIAGNOSIS — R279 Unspecified lack of coordination: Secondary | ICD-10-CM | POA: Insufficient documentation

## 2023-06-02 NOTE — Therapy (Signed)
 OUTPATIENT PHYSICAL THERAPY FEMALE PELVIC Treatment   Patient Name: Christina Burke MRN: [id number] DOB:12-Aug-1960, 63 y.o., female Today's Date: 06/02/2023  END OF SESSION:  PT End of Session - 06/02/23 1145     Visit Number 9    Date for PT Re-Evaluation 08/31/23    Authorization Type [employer]    PT Start Time 1145    PT Stop Time 1225    PT Time Calculation (min) 40 min    Activity Tolerance Patient tolerated treatment well    Behavior During Therapy WFL for tasks assessed/performed              Past Medical History:  Diagnosis Date   Anxiety    Cancer (HCC)    pt. reports had uterine cancer x 10 years ago   Chest pain 12/08/2013   Gallbladder stone with nonacute cholecystitis    GERD (gastroesophageal reflux disease) 08/10/2014   Hyperlipidemia    Hypertension    Murmur 12/08/2013   Past Surgical History:  Procedure Laterality Date   ABDOMINAL HYSTERECTOMY     ABDOMINAL SURGERY     COLONOSCOPY     UPPER GASTROINTESTINAL ENDOSCOPY  4/21/216   Patient Active Problem List   Diagnosis Date Noted   Lichen sclerosus 09/27/2021   Type 2 diabetes mellitus (HCC) 09/27/2021   Atherosclerotic cardiovascular disease 02/05/2021   Calculus of gallbladder without cholecystitis without obstruction 07/03/2020   Elevated lipase 07/03/2020   Intermittent diarrhea 07/03/2020   Dysphagia 02/19/2020   Schatzki's ring 02/19/2020   Right flank pain 02/19/2020   Constipation 02/19/2020   Family history of colonic polyps 02/19/2020   Spinal stenosis of cervical region 10/18/2019   Other spondylosis with radiculopathy, cervical region 08/09/2019   Vitamin D  deficiency 03/23/2018   Hypertension    Anxiety    Hyperlipidemia    GERD (gastroesophageal reflux disease) 08/10/2014   HTN (hypertension) 01/25/2014   Chest pain 12/08/2013   Murmur 12/08/2013    PCP: Delbert Clam, MD  REFERRING PROVIDER: Zuleta, Kaitlin G, NP  REFERRING DIAG: R35.0 (ICD-10-CM)  - Urinary frequency N39.3 (ICD-10-CM) - SUI (stress urinary incontinence, female) N81.10 (ICD-10-CM) - Prolapse of anterior vaginal wall  THERAPY DIAG:  Abnormal posture  Muscle weakness (generalized)  Other muscle spasm  Unspecified lack of coordination  Other low back pain  Rationale for Evaluation and Treatment: Rehabilitation  ONSET DATE: 10 years ago  SUBJECTIVE:                                                                                                                                                                                           SUBJECTIVE  STATEMENT: Pt states that she feels at least 50% better overall. She is starting to feel more comfortable with using the knack for coughing, not just getting up from seated position. She states that she feels ready to D/C at this time.   PAIN:  Are you having pain? Yes NPRS scale: 0/10 Pain location:  low back pain, Rt   Pain type: aching Pain description: constant   Aggravating factors: prolonged sitting, prolonged standing, lying down in the bed in one position too long Relieving factors: ibuprofen, changing positions    PRECAUTIONS: None  RED FLAGS: None   WEIGHT BEARING RESTRICTIONS: No  FALLS:  Has patient fallen in last 6 months? No  LIVING ENVIRONMENT: Lives with: lives alone Lives in: House/apartment   OCCUPATION: does not work  PLOF: Independent  PATIENT GOALS: to see if this will help with leakage   PERTINENT HISTORY:  Abdominal hysterectomy, history uterine cancer, cholecystectomy   BOWEL MOVEMENT: Pain with bowel movement: No Type of bowel movement:Frequency every day to every other day and Strain Yes - sometimes Fully empty rectum: No Leakage: No - maybe with diarrhea  Pads: No Fiber supplement: No  URINATION: Pain with urination: No Fully empty bladder: Yes: - Stream: Strong Urgency: Yes: occasional urgency Frequency: at least 3x/hour, sometimes will wake up at  night Leakage: Coughing, Sneezing, Laughing, and sitting down Pads: No, except when she has a cold   INTERCOURSE: Pain with intercourse:  no pain Ability to have vaginal penetration:  Yes: - [place]: states that clonipin makes it difficult Marinoff Scale: 0/3  PREGNANCY: Vaginal deliveries 2 Tearing Yes: episiotomy C-section deliveries 0 Currently pregnant No  PROLAPSE: No sensation of vaginal pressure    OBJECTIVE:  Note: Objective measures were completed at Evaluation unless otherwise noted. 06/02/23:               External Perineal Exam large active lichens sclerosis patch on Lt labia majora - pt was unaware                             Internal Pelvic Floor no tenderness  Patient confirms identification and approves PT to assess internal pelvic floor and treatment Yes  PELVIC MMT:   MMT eval  Vaginal 4/5, 15 second hold, 10 repeat contractions   (Blank rows = not tested)  Single leg stance demonstrates good pelvic stability bil  03/16/23: COGNITION: Overall cognitive status: Within functional limits for tasks assessed     SENSATION: Light touch: Appears intact Proprioception: Appears intact  FUNCTIONAL TESTS:  Squat: Large Rt weight shift Single leg stance:  Rt: Lt pelvic drop  Lt:Rt pelvic drop  GAIT: Comments: WNL  POSTURE: rounded shoulders, increased lumbar lordosis, decreased thoracic kyphosis, and anterior pelvic tilt  PELVIC ALIGNMENT: Rt posterior rotation  LUMBARAROM/PROM:  A/PROM A/PROM  Eval (% available)  Flexion 75  Extension 25  Right lateral flexion 25, pain on Rt  Left lateral flexion 50  Right rotation  10  Left rotation 10   (Blank rows = not tested)   PALPATION:   General  Bil lumbar paraspinal restriction/high tone;                 External Perineal Exam some redness/dryness in vestibule and at posterior fourchette; tenderness over bladder externally  Internal Pelvic Floor no tenderness, some  increase in tone in Rt deep pelvic floor  Patient confirms identification and approves PT to assess internal pelvic floor and treatment Yes  PELVIC MMT:   MMT eval  Vaginal 3/5, 2 second hold, 6 repeat contractions   Diastasis Recti 2 finger width separation at umbilicus with distortion with curl-up test  (Blank rows = not tested)        TONE: Increase Rt deep layer  PROLAPSE: Grade 2 anterior vaginal wall laxity, grade 1 posterior vaginal wall laxity  TODAY'S TREATMENT:                                                                                                                              DATE:  06/02/23 Neuromuscular re-education: Pt provides verbal consent for internal vaginal/rectal pelvic floor exam. Reassessment of pelvic floor muscle contractions Pelvic floor muscle strengthening Quick flicks 10x Long holds 5 x 10 seconds Therapeutic activities: Standing march with anterior weight hold 3 lbs 3 x 10 Standing chop 3 lbs 2 x 10 bil  Standing bil UE extensions 2 x 10 red band  Squats to table 2 x 10 3-way kick 10x each, bil    05/13/23 Neuromuscular re-education: Supine modified dead bug 10x bil Swiss ball supine chop 3 lbs weight 10x bil Supine posterior pelvic tilts 2 x 10 Therapeutic activities: The knack Sit<>stand with pelvic floor muscle contraction while rising with exhale Lower trunk rotation in supine with LE supported on red ball to help improve breath/pelvic floor muscle coordination in bed mobility   05/05/23 Neuromuscular re-education: Seated unilateral hip abduction green band 2 x 10 bil Seated resisted march green band with transversus abdominus and pelvic floor muscle 2 x 10  Seated bil UE flexion to shoulder level 2 x 10 5lbs  Standing march with anterior weight hold 3 lbs 3 x 10 Standing chop 3 lbs 2 x 10 bil  Standing bil UE extensions 2 x 10 red band  Therapeutic activities: Squats to table 2 x 10 3-way kick 10x each, bil    PATIENT  EDUCATION:  Education details: [id number], urge drill, voiding mechanics Person educated: Patient Education method: Explanation, Demonstration, Tactile cues, Verbal cues, and Handouts Education comprehension: verbalized understanding  HOME EXERCISE PROGRAM: [id number]  ASSESSMENT:  CLINICAL IMPRESSION: Pt is here for her last visit. She feels like she has made at least 50% improvement in bladder control since starting PT. Internal vaginal pelvic floor muscle exam performed today with notable improvements in strength, endurance, and coordination. Believe that continuing to work on facilitation of pelvic floor muscles and core in all activities will continue to allow her to see greater progress with bladder control. We discussed that she can return to physical therapy if she feels like she is not continuing to make progress. Due to being pleased with progress and having met most of her rehabilitative goals, pt is ready to discharge skilled physical therapy intervention at this  time.   OBJECTIVE IMPAIRMENTS: decreased activity tolerance, decreased coordination, decreased endurance, decreased strength, increased fascial restrictions, increased muscle spasms, impaired tone, postural dysfunction, and pain.   ACTIVITY LIMITATIONS: bending, sitting, standing, squatting, transfers, and continence  PARTICIPATION LIMITATIONS: community activity and daily activities  PERSONAL FACTORS: 1 comorbidity: medical history  are also affecting patient's functional outcome.   REHAB POTENTIAL: Good  CLINICAL DECISION MAKING: Stable/uncomplicated  EVALUATION COMPLEXITY: Low   GOALS: Goals reviewed with patient? Yes  SHORT TERM GOALS: Target date: 04/13/23 - updated 04/13/23 - updated 05/13/23 - updated 06/02/23  Pt will be independent with HEP.   Baseline: Goal status: MET 04/13/23  2.  Pt will increase pelvic floor muscle endurance to 6 second hold.  Baseline:  Goal status: MET 06/02/23  3.  Pt will be  able to correctly perform diaphragmatic breathing and appropriate pressure management in order to prevent worsening vaginal wall laxity and improve pelvic floor A/ROM.   Baseline: Pt still having trouble with this during bed mobility and exercises Goal status: MET 06/02/23   4.  Pt will be independent with the knack, urge suppression technique, and double voiding in order to improve bladder habits and decrease urinary incontinence.   Baseline:  Goal status: MET 04/13/23   LONG TERM GOALS: Target date: 08/31/2023 - updated 04/13/23 - updated 05/13/23 - updated 06/02/23  Pt will be independent with advanced HEP.   Baseline:  Goal status: MET 06/02/23  2.  Pt will demonstrate pelvic floor muscle contraction strength 4/5.  Baseline:  Goal status: MET 06/02/23  3.  Pt will demonstrate pelvic floor muscle endurance >10 seconds. Baseline:  Goal status: 06/02/23  4.  Pt will be able to stand on single LE without pelvic drop for >10 seconds in order to demonstrate good pelvic stability and core strength.  Baseline:  Goal status: 06/02/23  5.  Pt will report no leaks with laughing, coughing, sneezing in order to improve comfort with interpersonal relationships and community activities.   Baseline: Pt states that this is better, but she is still having some leaking Goal status: DISCHARGED 06/02/23  6.  Pt will be able to go 2-3 hours in between voids without urgency or incontinence in order to improve QOL and perform all functional activities with less difficulty.   Baseline:  Goal status: MET 06/02/23  PLAN:  PT FREQUENCY:-  PT DURATION: -  PLANNED INTERVENTIONS: -  PLAN FOR NEXT SESSION: DC  PHYSICAL THERAPY DISCHARGE SUMMARY  Visits from Start of Care: 9  Current functional level related to goals / functional outcomes: Independent   Remaining deficits: See above   Education / Equipment: HEP   Patient agrees to discharge. Patient goals were partially met. Patient is being  discharged due to being pleased with the current functional level.   Josette Mares, PT, DPT02/07/2510:08 PM

## 2023-06-10 ENCOUNTER — Other Ambulatory Visit: Payer: Self-pay

## 2023-06-10 ENCOUNTER — Other Ambulatory Visit: Payer: Self-pay | Admitting: Family Medicine

## 2023-06-10 MED ORDER — GLUCOSE BLOOD VI STRP
ORAL_STRIP | 0 refills | Status: DC
  Filled 2023-06-10: qty 100, 90d supply, fill #0

## 2023-06-10 MED ORDER — ACCU-CHEK SOFTCLIX LANCETS MISC
0 refills | Status: AC
  Filled 2023-06-10: qty 100, 90d supply, fill #0

## 2023-06-14 ENCOUNTER — Other Ambulatory Visit: Payer: Self-pay | Admitting: Family Medicine

## 2023-06-14 DIAGNOSIS — L900 Lichen sclerosus et atrophicus: Secondary | ICD-10-CM

## 2023-06-15 ENCOUNTER — Other Ambulatory Visit: Payer: Self-pay

## 2023-06-15 MED ORDER — CLOBETASOL PROPIONATE 0.05 % EX CREA
1.0000 | TOPICAL_CREAM | Freq: Two times a day (BID) | CUTANEOUS | 0 refills | Status: AC
  Filled 2023-06-15: qty 60, 30d supply, fill #0

## 2023-06-18 ENCOUNTER — Other Ambulatory Visit: Payer: Self-pay

## 2023-06-23 ENCOUNTER — Emergency Department (HOSPITAL_COMMUNITY): Payer: Medicaid Other

## 2023-06-23 ENCOUNTER — Emergency Department (HOSPITAL_COMMUNITY)
Admission: EM | Admit: 2023-06-23 | Discharge: 2023-06-24 | Disposition: A | Discharge status: Home / Self Care | Payer: Medicaid Other | Attending: Emergency Medicine | Admitting: Emergency Medicine

## 2023-06-23 ENCOUNTER — Other Ambulatory Visit: Payer: Self-pay

## 2023-06-23 DIAGNOSIS — E860 Dehydration: Secondary | ICD-10-CM | POA: Insufficient documentation

## 2023-06-23 DIAGNOSIS — J101 Influenza due to other identified influenza virus with other respiratory manifestations: Secondary | ICD-10-CM | POA: Diagnosis not present

## 2023-06-23 DIAGNOSIS — R519 Headache, unspecified: Secondary | ICD-10-CM | POA: Diagnosis present

## 2023-06-23 DIAGNOSIS — S0081XA Abrasion of other part of head, initial encounter: Secondary | ICD-10-CM | POA: Insufficient documentation

## 2023-06-23 DIAGNOSIS — R55 Syncope and collapse: Secondary | ICD-10-CM

## 2023-06-23 DIAGNOSIS — W0110XA Fall on same level from slipping, tripping and stumbling with subsequent striking against unspecified object, initial encounter: Secondary | ICD-10-CM | POA: Insufficient documentation

## 2023-06-23 LAB — CBG MONITORING, ED: Glucose-Capillary: 136 mg/dL — ABNORMAL HIGH (ref 70–99)

## 2023-06-23 MED ORDER — LACTATED RINGERS IV BOLUS
1000.0000 mL | Freq: Once | INTRAVENOUS | Status: AC
  Administered 2023-06-24: 1000 mL via INTRAVENOUS

## 2023-06-23 MED ORDER — ONDANSETRON HCL 4 MG/2ML IJ SOLN
4.0000 mg | Freq: Once | INTRAMUSCULAR | Status: AC
  Administered 2023-06-24: 4 mg via INTRAVENOUS
  Filled 2023-06-23: qty 2

## 2023-06-23 NOTE — ED Triage Notes (Addendum)
 Pt comes in from the lobby, she was waiting with her husband to be seen. She stated she has been feeling sick witht the flue the past few days. She passed out in the lobby and hit her head.

## 2023-06-24 LAB — COMPREHENSIVE METABOLIC PANEL
ALT: 31 U/L (ref 0–44)
AST: 37 U/L (ref 15–41)
Albumin: 3.9 g/dL (ref 3.5–5.0)
Alkaline Phosphatase: 72 U/L (ref 38–126)
Anion gap: 12 (ref 5–15)
BUN: 11 mg/dL (ref 8–23)
CO2: 23 mmol/L (ref 22–32)
Calcium: 9.3 mg/dL (ref 8.9–10.3)
Chloride: 102 mmol/L (ref 98–111)
Creatinine, Ser: 0.99 mg/dL (ref 0.44–1.00)
GFR, Estimated: >60 mL/min (ref >60)
Glucose, Bld: 135 mg/dL — ABNORMAL HIGH (ref 70–99)
Potassium: 3.6 mmol/L (ref 3.5–5.1)
Sodium: 137 mmol/L (ref 135–145)
Total Bilirubin: 0.6 mg/dL (ref 0.0–1.2)
Total Protein: 7.5 g/dL (ref 6.5–8.1)

## 2023-06-24 LAB — CBC WITH DIFFERENTIAL/PLATELET
Abs Immature Granulocytes: 0.03 10*3/uL (ref 0.00–0.07)
Basophils Absolute: 0 10*3/uL (ref 0.0–0.1)
Basophils Relative: 1 %
Eosinophils Absolute: 0 10*3/uL (ref 0.0–0.5)
Eosinophils Relative: 0 %
HCT: 41.7 % (ref 36.0–46.0)
Hemoglobin: 13.8 g/dL (ref 12.0–15.0)
Immature Granulocytes: 1 %
Lymphocytes Relative: 35 %
Lymphs Abs: 2.2 10*3/uL (ref 0.7–4.0)
MCH: 30.4 pg (ref 26.0–34.0)
MCHC: 33.1 g/dL (ref 30.0–36.0)
MCV: 91.9 fL (ref 80.0–100.0)
Monocytes Absolute: 1.3 10*3/uL — ABNORMAL HIGH (ref 0.1–1.0)
Monocytes Relative: 20 %
Neutro Abs: 2.7 10*3/uL (ref 1.7–7.7)
Neutrophils Relative %: 43 %
Platelets: 224 10*3/uL (ref 150–400)
RBC: 4.54 MIL/uL (ref 3.87–5.11)
RDW: 13.2 % (ref 11.5–15.5)
WBC: 6.3 10*3/uL (ref 4.0–10.5)
nRBC: 0 % (ref 0.0–0.2)

## 2023-06-24 LAB — URINALYSIS, ROUTINE W REFLEX MICROSCOPIC
Bilirubin Urine: NEGATIVE
Glucose, UA: NEGATIVE mg/dL
Hgb urine dipstick: NEGATIVE
Ketones, ur: NEGATIVE mg/dL
Leukocytes,Ua: NEGATIVE
Nitrite: NEGATIVE
Protein, ur: NEGATIVE mg/dL
Specific Gravity, Urine: 1.006 (ref 1.005–1.030)
pH: 6 (ref 5.0–8.0)

## 2023-06-24 LAB — RESP PANEL BY RT-PCR (RSV, FLU A&B, COVID)  RVPGX2
Influenza A by PCR: POSITIVE — AB
Influenza B by PCR: NEGATIVE
Resp Syncytial Virus by PCR: NEGATIVE
SARS Coronavirus 2 by RT PCR: NEGATIVE

## 2023-06-24 LAB — TYPE AND SCREEN

## 2023-06-24 LAB — ABO/RH

## 2023-06-24 MED ORDER — LACTATED RINGERS IV BOLUS
1000.0000 mL | Freq: Once | INTRAVENOUS | Status: AC
  Administered 2023-06-24: 1000 mL via INTRAVENOUS

## 2023-06-24 NOTE — ED Notes (Signed)
 Pt transported to CT ?

## 2023-06-24 NOTE — ED Notes (Signed)
   06/24/23 0334  Vitals  BP 124/63  MAP (mmHg) 78  Pulse Rate 97  ECG Heart Rate 96  Resp 14  MEWS COLOR  MEWS Score Color Green  Orthostatic Sitting  BP- Sitting 124/63  Pulse- Sitting 98  Orthostatic Standing at 0 minutes  BP- Standing at 0 minutes 114/75  Pulse- Standing at 0 minutes 104  Oxygen Therapy  SpO2 100 %  MEWS Score  MEWS Temp 0  MEWS Systolic 0  MEWS Pulse 0  MEWS RR 0  MEWS LOC 0  MEWS Score 0

## 2023-06-24 NOTE — ED Notes (Signed)
Pt ambulated to the bathroom with no issues.

## 2023-06-24 NOTE — ED Provider Notes (Signed)
 [place] [facility] AT [facility] Provider Note   CSN: [id number] Arrival date & time: 06/23/23  2327     History  Chief Complaint  Patient presents with   Loss of Consciousness    Christina Burke is a 63 y.o. female.  Patient has been sick for about 3 days.  She has had a bit of a cough feeling unwell and a fever at home.  States that if that they had the flu.  She is actually here with her husband.  Patient was not plan on checking him but then while there wait and she had a syncopal event fell and hit her forehead.  Was unconscious for couple minutes and is starting to feel better now at the time of my evaluation.  She does have some headache from the fall.  No other associated symptoms.   Loss of Consciousness      Home Medications Prior to Admission medications   Medication Sig Start Date End Date Taking? Authorizing Provider  Accu-Chek Softclix Lancets lancets Use to check blood sugar once daily. 06/10/23   Hoy Register, MD  atorvastatin (LIPITOR) 40 MG tablet Take 1 tablet (40 mg total) by mouth daily. 01/13/23   Hoy Register, MD  benazepril (LOTENSIN) 20 MG tablet Take 1 tablet (20 mg total) by mouth daily. 01/12/23   Hoy Register, MD  Blood Glucose Monitoring Suppl (ACCU-CHEK GUIDE) w/Device KIT Use to check blood sugar once daily. 01/12/23   Hoy Register, MD  clobetasol cream (TEMOVATE) 0.05 % Apply 1 Application topically 2 (two) times daily. Apply to the vulva for 4 weeks. 06/15/23   Hoy Register, MD  clonazePAM (KLONOPIN) 1 MG tablet Take 1 mg by mouth 2 (two) times daily.    [provider]  DULoxetine (CYMBALTA) 60 MG capsule Take 1 capsule (60 mg total) by mouth 2 (two) times daily. For back pain 06/28/19   Raulkar, Drema Pry, MD  glucose blood test strip Use to check blood sugar once daily. 06/10/23   Hoy Register, MD  moxifloxacin (VIGAMOX) 0.5 % ophthalmic solution Place 1 drop into the left eye 3 (three) times daily.  11/08/22   Rising, Lurena Joiner, PA-C      Allergies    Amoxicillin    Review of Systems   Review of Systems  Cardiovascular:  Positive for syncope.    Physical Exam Updated Vital Signs BP 114/75   Pulse 98   Temp 99 F (37.2 C) (Oral)   Resp 16   SpO2 100%  Physical Exam Vitals and nursing note reviewed.  Constitutional:      Appearance: She is well-developed.  HENT:     Head: Normocephalic.     Comments: [name] and abrasion to forehead Cardiovascular:     Rate and Rhythm: Normal rate and regular rhythm.  Pulmonary:     Effort: No respiratory distress.     Breath sounds: No stridor.  Abdominal:     General: There is no distension.  Musculoskeletal:     Cervical back: Normal range of motion.  Neurological:     Mental Status: She is alert.     ED Results / Procedures / Treatments   Labs (all labs ordered are listed, but only abnormal results are displayed) Labs Reviewed  RESP PANEL BY RT-PCR (RSV, FLU A&B, COVID)  RVPGX2 - Abnormal; Notable for the following components:      Result Value   Influenza A by PCR POSITIVE (*)    All other components  within normal limits  CBC WITH DIFFERENTIAL/PLATELET - Abnormal; Notable for the following components:   Monocytes Absolute 1.3 (*)    All other components within normal limits  COMPREHENSIVE METABOLIC PANEL - Abnormal; Notable for the following components:   Glucose, Bld 135 (*)    All other components within normal limits  CBG MONITORING, ED - Abnormal; Notable for the following components:   Glucose-Capillary 136 (*)    All other components within normal limits  URINALYSIS, ROUTINE W REFLEX MICROSCOPIC  TYPE AND SCREEN  ABO/RH    EKG None  Radiology CT Maxillofacial Wo Contrast Result Date: 06/24/2023 CLINICAL DATA:  Syncope, head injury EXAM: CT HEAD WITHOUT CONTRAST CT MAXILLOFACIAL WITHOUT CONTRAST CT CERVICAL SPINE WITHOUT CONTRAST TECHNIQUE: Multidetector CT imaging of the head, cervical spine, and  maxillofacial structures were performed using the standard protocol without intravenous contrast. Multiplanar CT image reconstructions of the cervical spine and maxillofacial structures were also generated. RADIATION DOSE REDUCTION: This exam was performed according to the departmental dose-optimization program which includes automated exposure control, adjustment of the mA and/or kV according to patient size and/or use of iterative reconstruction technique. COMPARISON:  CT head dated 03/20/2020. FINDINGS: CT HEAD FINDINGS Brain: No evidence of acute infarction, hemorrhage, hydrocephalus, extra-axial collection or mass lesion/mass effect. Mild subcortical white matter and periventricular small vessel ischemic changes. Vascular: Mild intracranial atherosclerosis. Skull: Normal. Negative for fracture or focal lesion. Other: Mild soft tissue swelling/hematoma overlying the midline frontal bone (series 3/image 8). CT MAXILLOFACIAL FINDINGS Osseous: No evidence maxillofacial fracture. Nasal bones are intact. Mandible is intact. Bilateral mandibular condyles are well-seated in the TMJs. Orbits: Bilateral orbits, including the globes and retroconal soft tissues, are within normal limits. Sinuses: The visualized paranasal sinuses are essentially clear. The mastoid air cells are unopacified. Soft tissues: Negative. CT CERVICAL SPINE FINDINGS Alignment: Reversal of the normal cervical lordosis. Skull base and vertebrae: No acute fracture. No primary bone lesion or focal pathologic process. Soft tissues and spinal canal: No prevertebral fluid or swelling. No visible canal hematoma. Disc levels: Mild degenerative changes of the mid cervical spine, most prominent C5-6 and C6-7. Spinal canal is patent. Upper chest: Visualized lung apices are clear. Other: Visualized thyroid is unremarkable. IMPRESSION: Mild soft tissue swelling/hematoma overlying the midline frontal bone. No evidence of calvarial fracture. No acute intracranial  abnormality. Mild small vessel ischemic changes. Normal maxillofacial CT. No traumatic injury to the cervical spine. Mild degenerative changes. Electronically Signed   By: Charline Bills M.D.   On: 06/24/2023 00:25   CT Head Wo Contrast Result Date: 06/24/2023 CLINICAL DATA:  Syncope, head injury EXAM: CT HEAD WITHOUT CONTRAST CT MAXILLOFACIAL WITHOUT CONTRAST CT CERVICAL SPINE WITHOUT CONTRAST TECHNIQUE: Multidetector CT imaging of the head, cervical spine, and maxillofacial structures were performed using the standard protocol without intravenous contrast. Multiplanar CT image reconstructions of the cervical spine and maxillofacial structures were also generated. RADIATION DOSE REDUCTION: This exam was performed according to the departmental dose-optimization program which includes automated exposure control, adjustment of the mA and/or kV according to patient size and/or use of iterative reconstruction technique. COMPARISON:  CT head dated 03/20/2020. FINDINGS: CT HEAD FINDINGS Brain: No evidence of acute infarction, hemorrhage, hydrocephalus, extra-axial collection or mass lesion/mass effect. Mild subcortical white matter and periventricular small vessel ischemic changes. Vascular: Mild intracranial atherosclerosis. Skull: Normal. Negative for fracture or focal lesion. Other: Mild soft tissue swelling/hematoma overlying the midline frontal bone (series 3/image 8). CT MAXILLOFACIAL FINDINGS Osseous: No evidence maxillofacial fracture. Nasal bones are  intact. Mandible is intact. Bilateral mandibular condyles are well-seated in the TMJs. Orbits: Bilateral orbits, including the globes and retroconal soft tissues, are within normal limits. Sinuses: The visualized paranasal sinuses are essentially clear. The mastoid air cells are unopacified. Soft tissues: Negative. CT CERVICAL SPINE FINDINGS Alignment: Reversal of the normal cervical lordosis. Skull base and vertebrae: No acute fracture. No primary bone lesion  or focal pathologic process. Soft tissues and spinal canal: No prevertebral fluid or swelling. No visible canal hematoma. Disc levels: Mild degenerative changes of the mid cervical spine, most prominent C5-6 and C6-7. Spinal canal is patent. Upper chest: Visualized lung apices are clear. Other: Visualized thyroid is unremarkable. IMPRESSION: Mild soft tissue swelling/hematoma overlying the midline frontal bone. No evidence of calvarial fracture. No acute intracranial abnormality. Mild small vessel ischemic changes. Normal maxillofacial CT. No traumatic injury to the cervical spine. Mild degenerative changes. Electronically Signed   By: Charline Bills M.D.   On: 06/24/2023 00:25   CT Cervical Spine Wo Contrast Result Date: 06/24/2023 CLINICAL DATA:  Syncope, head injury EXAM: CT HEAD WITHOUT CONTRAST CT MAXILLOFACIAL WITHOUT CONTRAST CT CERVICAL SPINE WITHOUT CONTRAST TECHNIQUE: Multidetector CT imaging of the head, cervical spine, and maxillofacial structures were performed using the standard protocol without intravenous contrast. Multiplanar CT image reconstructions of the cervical spine and maxillofacial structures were also generated. RADIATION DOSE REDUCTION: This exam was performed according to the departmental dose-optimization program which includes automated exposure control, adjustment of the mA and/or kV according to patient size and/or use of iterative reconstruction technique. COMPARISON:  CT head dated 03/20/2020. FINDINGS: CT HEAD FINDINGS Brain: No evidence of acute infarction, hemorrhage, hydrocephalus, extra-axial collection or mass lesion/mass effect. Mild subcortical white matter and periventricular small vessel ischemic changes. Vascular: Mild intracranial atherosclerosis. Skull: Normal. Negative for fracture or focal lesion. Other: Mild soft tissue swelling/hematoma overlying the midline frontal bone (series 3/image 8). CT MAXILLOFACIAL FINDINGS Osseous: No evidence maxillofacial fracture.  Nasal bones are intact. Mandible is intact. Bilateral mandibular condyles are well-seated in the TMJs. Orbits: Bilateral orbits, including the globes and retroconal soft tissues, are within normal limits. Sinuses: The visualized paranasal sinuses are essentially clear. The mastoid air cells are unopacified. Soft tissues: Negative. CT CERVICAL SPINE FINDINGS Alignment: Reversal of the normal cervical lordosis. Skull base and vertebrae: No acute fracture. No primary bone lesion or focal pathologic process. Soft tissues and spinal canal: No prevertebral fluid or swelling. No visible canal hematoma. Disc levels: Mild degenerative changes of the mid cervical spine, most prominent C5-6 and C6-7. Spinal canal is patent. Upper chest: Visualized lung apices are clear. Other: Visualized thyroid is unremarkable. IMPRESSION: Mild soft tissue swelling/hematoma overlying the midline frontal bone. No evidence of calvarial fracture. No acute intracranial abnormality. Mild small vessel ischemic changes. Normal maxillofacial CT. No traumatic injury to the cervical spine. Mild degenerative changes. Electronically Signed   By: Charline Bills M.D.   On: 06/24/2023 00:25   DG Chest Portable 1 View Result Date: 06/23/2023 CLINICAL DATA:  Syncope.  Patient passed out and hit head. EXAM: PORTABLE CHEST 1 VIEW COMPARISON:  09/19/2021 FINDINGS: Shallow inspiration. Heart size and pulmonary vascularity are normal. Suggestion of airspace infiltration in the left lung base, possibly pneumonia. Right lung is clear. No pleural effusion or pneumothorax. Mediastinal contours appear intact. IMPRESSION: Focal infiltration in the left lung base possibly pneumonia. Electronically Signed   By: Burman Nieves M.D.   On: 06/23/2023 23:54    Procedures Procedures    Medications Ordered in ED  Medications  lactated ringers bolus 1,000 mL (0 mLs Intravenous Stopped 06/24/23 0141)  ondansetron (ZOFRAN) injection 4 mg (4 mg Intravenous Given  06/24/23 0016)  lactated ringers bolus 1,000 mL (0 mLs Intravenous Stopped 06/24/23 0330)    ED Course/ Medical Decision Making/ A&P                                 Medical Decision Making Amount and/or Complexity of Data Reviewed Labs: ordered. Radiology: ordered.  Risk Prescription drug management.   Pallor improved.  Patient is feeling better after some fluids.  Flu was positive.  She did have some findings on her x-ray however very minimal likely related to a viral infection rather than bacterial.  Will likely give her as needed prescription for antibiotics.  She is outside the window for Tamiflu.  Trauma wise she did not have any traumatic injuries to her head besides soft tissue.  Labs are reassuring.  Vital signs are within normal limits.  Will trial ambulation and orthostatic vital signs for likely discharge.  Patient feels much better. Better color. Ambulated without difficulty. Stable for discharge.    Final Clinical Impression(s) / ED Diagnoses Final diagnoses:  Influenza A  Dehydration  Syncope and collapse    Rx / DC Orders ED Discharge Orders     None         Byren Pankow, Barbara Cower, MD 06/24/23 [phone]

## 2023-06-30 ENCOUNTER — Encounter: Payer: Self-pay | Admitting: Family Medicine

## 2023-06-30 ENCOUNTER — Other Ambulatory Visit: Payer: Self-pay | Admitting: Family Medicine

## 2023-06-30 MED ORDER — AZITHROMYCIN 250 MG PO TABS: ORAL_TABLET | ORAL | 0 refills | Status: AC

## 2023-07-22 ENCOUNTER — Ambulatory Visit: Payer: Medicaid Other | Attending: Family Medicine | Admitting: Family Medicine

## 2023-07-22 ENCOUNTER — Encounter: Payer: Self-pay | Admitting: Family Medicine

## 2023-07-22 ENCOUNTER — Other Ambulatory Visit: Payer: Self-pay

## 2023-07-22 VITALS — BP 129/86 | HR 99 | Ht 63.5 in | Wt 228.8 lb

## 2023-07-22 DIAGNOSIS — R6889 Other general symptoms and signs: Secondary | ICD-10-CM | POA: Diagnosis not present

## 2023-07-22 DIAGNOSIS — I10 Essential (primary) hypertension: Secondary | ICD-10-CM

## 2023-07-22 DIAGNOSIS — E119 Type 2 diabetes mellitus without complications: Secondary | ICD-10-CM

## 2023-07-22 DIAGNOSIS — E1169 Type 2 diabetes mellitus with other specified complication: Secondary | ICD-10-CM

## 2023-07-22 LAB — POCT GLYCOSYLATED HEMOGLOBIN (HGB A1C): HbA1c, POC (controlled diabetic range): 6.2 % (ref 0.0–7.0)

## 2023-07-22 MED ORDER — BENAZEPRIL HCL 20 MG PO TABS
20.0000 mg | ORAL_TABLET | Freq: Every day | ORAL | 1 refills | Status: DC
  Filled 2023-07-22 – 2023-09-02 (×2): qty 90, 90d supply, fill #0
  Filled 2023-11-30: qty 90, 90d supply, fill #1

## 2023-07-22 MED ORDER — ATORVASTATIN CALCIUM 40 MG PO TABS
40.0000 mg | ORAL_TABLET | Freq: Every day | ORAL | 1 refills | Status: AC
  Filled 2023-07-22: qty 90, 90d supply, fill #0
  Filled 2024-05-03 – 2024-05-09 (×2): qty 90, 90d supply, fill #1

## 2023-07-22 NOTE — Patient Instructions (Signed)
 VISIT SUMMARY:  During today's visit, we discussed your recent blurry vision, weight gain, and overall health management. We reviewed your diabetes and cholesterol management, as well as your blood pressure and general health maintenance. We also addressed your concerns about feeling hot frequently and your desire to increase physical activity.  YOUR PLAN:  -BLURRY VISION: Your blurry vision has resolved and is not likely related to your diabetes. We recommend monitoring your blood glucose levels regularly and maintaining a healthy diet. If the blurry vision returns, please follow up with an eye specialist.  -TYPE II DIABETES MELLITUS: Your diabetes is well-managed with a current A1c of 6.2. Continue with your dietary management and lifestyle modifications, and engage in regular physical activity like walking to help control your blood sugar levels.  -HYPERLIPIDEMIA: Hyperlipidemia means having high levels of fats in your blood, which can increase your risk of heart disease. You should resume taking atorvastatin (Lipitor) 20 mg daily to help reduce this risk. We will also check your fasting lipid panel and liver function.  -HYPERTENSION: Your blood pressure is well-controlled. Please continue taking amlodipine as prescribed.  -GENERAL HEALTH MAINTENANCE: You have gained five pounds since your last visit. Regular physical activity, including walking and strength training, is encouraged to help with weight loss and overall health. We will also check your thyroid function to understand the cause of your frequent hot sensations.  INSTRUCTIONS:  Please schedule your fasting lipid panel and liver function tests for tomorrow at 8:30 AM. Follow up in six months or sooner if any issues arise. You will receive your test results via MyChart.

## 2023-07-22 NOTE — Progress Notes (Signed)
 Subjective:  Patient ID: Christina Burke, female    DOB: 10-17-1960  Age: 63 y.o. MRN: [id number]  CC: Medical Management of Chronic Issues (Blurred vision 1 month ago/)     Discussed the use of AI scribe software for clinical note transcription with the patient, who gave verbal consent to proceed.  History of Present Illness The patient, with a history of  hypertension, anxiety, hyperlipidemia history of endometrial CA confined to the uterus status post hysterectomy , esophageal dysphagia s/p esophageal dilatation, atherosclerotic cardiovascular disease, Type 2 diabetes mellitus, alcohol dependence presents with blurry vision and weight gain.   The blurry vision occurred prior to a recent bout of flu, which was severe enough to warrant a visit to the emergency room and subsequent antibiotic treatment. The patient reports that the blurry vision lasted for about a month and then resolved. She also reports headaches during this period. The patient checked her blood sugar during this time and found it to be elevated, but not extremely high but she is unable to provide an actual reading that she got. She has not been taking any diabetes medication (previously metformin was prescribed but she wanted to work on diet controlled). She has also stopped taking her cholesterol medication (Lipitor) due to stomach upset during her illness. The patient is unsure whether the stomach upset was due to the medication or the flu.  The patient also reports feeling hot frequently, even when others in the room do not feel hot. She is unsure whether this is due to her weight, menopause, or another cause. The patient has gained five pounds since her last visit in September. She expresses a desire to increase her physical activity, particularly walking, now that the weather is improving.  The patient also reports taking Klonopin for anxiety, which she finds helpful for sleep and relaxation. She sees a psychiatrist  every six months.    Past Medical History:  Diagnosis Date   Anxiety    Cancer (HCC)    pt. reports had uterine cancer x 10 years ago   Chest pain 12/08/2013   Gallbladder stone with nonacute cholecystitis    GERD (gastroesophageal reflux disease) 08/10/2014   Hyperlipidemia    Hypertension    Murmur 12/08/2013    Past Surgical History:  Procedure Laterality Date   ABDOMINAL HYSTERECTOMY     ABDOMINAL SURGERY     COLONOSCOPY     UPPER GASTROINTESTINAL ENDOSCOPY  4/21/216    Family History  Problem Relation Age of Onset   Gallbladder disease Mother    Heart disease Father    Lung cancer Father    Brain cancer Father    Colon polyps Sister    Skin cancer Sister    Diabetes Sister        x3   Diabetes Brother    Breast cancer Paternal Aunt    Colon cancer Neg Hx    Kidney disease Neg Hx    Esophageal cancer Neg Hx    Inflammatory bowel disease Neg Hx    Liver disease Neg Hx    Pancreatic cancer Neg Hx    Rectal cancer Neg Hx    Stomach cancer Neg Hx     Social History   Socioeconomic History   Marital status: Single    Spouse name: Not on file   Number of children: 2   Years of education: Not on file   Highest education level: Some college, no degree  Occupational History   Occupation: Unemployed  Tobacco Use   Smoking status: Never    Passive exposure: Never   Smokeless tobacco: Never  Vaping Use   Vaping status: Never Used  Substance and Sexual Activity   Alcohol use: Yes    Alcohol/week: 6.0 standard drinks of alcohol    Types: 6 Cans of beer per week   Drug use: No   Sexual activity: Not Currently    Birth control/protection: None  Other Topics Concern   Not on file  Social History Narrative   Right handed   Social Drivers of Health   Financial Resource Strain: Not on file  Food Insecurity: No Food Insecurity (01/08/2021)   Hunger Vital Sign    Worried About Running Out of Food in the Last Year: Never true    Ran Out of Food in the Last  Year: Never true  Transportation Needs: No Transportation Needs (01/08/2021)   PRAPARE - Administrator, Civil Service (Medical): No    Lack of Transportation (Non-Medical): No  Physical Activity: Not on file  Stress: Not on file  Social Connections: Not on file    Allergies  Allergen Reactions   Amoxicillin Rash    Has patient had a PCN reaction causing immediate rash, facial/tongue/throat swelling, SOB or lightheadedness with hypotension: yes Has patient had a PCN reaction causing severe rash involving mucus membranes or skin necrosis: no Has patient had a PCN reaction that required hospitalization: no Has patient had a PCN reaction occurring within the last 10 years: no If all of the above answers are "NO", then may proceed with Cephalosporin use.     Outpatient Medications Prior to Visit  Medication Sig Dispense Refill   Accu-Chek Softclix Lancets lancets Use to check blood sugar once daily. 100 each 0   Blood Glucose Monitoring Suppl (ACCU-CHEK GUIDE) w/Device KIT Use to check blood sugar once daily. 1 kit 0   clobetasol cream (TEMOVATE) 0.05 % Apply 1 Application topically 2 (two) times daily. Apply to the vulva for 4 weeks. 60 g 0   clonazePAM (KLONOPIN) 1 MG tablet Take 1 mg by mouth 2 (two) times daily.     glucose blood test strip Use to check blood sugar once daily. 100 each 0   atorvastatin (LIPITOR) 40 MG tablet Take 1 tablet (40 mg total) by mouth daily. 90 tablet 1   benazepril (LOTENSIN) 20 MG tablet Take 1 tablet (20 mg total) by mouth daily. 90 tablet 1   DULoxetine (CYMBALTA) 60 MG capsule Take 1 capsule (60 mg total) by mouth 2 (two) times daily. For back pain (Patient not taking: Reported on 07/22/2023) 60 capsule 3   moxifloxacin (VIGAMOX) 0.5 % ophthalmic solution Place 1 drop into the left eye 3 (three) times daily. (Patient not taking: Reported on 07/22/2023) 3 mL 0   No facility-administered medications prior to visit.     ROS Review of Systems   Constitutional:  Negative for activity change and appetite change.  HENT:  Negative for sinus pressure and sore throat.   Eyes:  Positive for visual disturbance.  Respiratory:  Negative for chest tightness, shortness of breath and wheezing.   Cardiovascular:  Negative for chest pain and palpitations.  Gastrointestinal:  Negative for abdominal distention, abdominal pain and constipation.  Genitourinary: Negative.   Musculoskeletal: Negative.   Psychiatric/Behavioral:  Negative for behavioral problems and dysphoric mood.     Objective:  BP 129/86   Pulse 99   Ht 5' 3.5" (1.613 m)   Wt 228 lb  12.8 oz (103.8 kg)   SpO2 97%   BMI 39.89 kg/m      07/22/2023    2:33 PM 06/24/2023    3:36 AM 06/24/2023    3:34 AM  BP/Weight  Systolic BP 129 114 124  Diastolic BP 86 75 63  Wt. (Lbs) 228.8    BMI 39.89 kg/m2      Wt Readings from Last 3 Encounters:  07/22/23 228 lb 12.8 oz (103.8 kg)  01/12/23 223 lb 9.6 oz (101.4 kg)  11/08/22 213 lb (96.6 kg)      Physical Exam Constitutional:      Appearance: She is well-developed.  Cardiovascular:     Rate and Rhythm: Normal rate.     Heart sounds: Normal heart sounds. No murmur heard. Pulmonary:     Effort: Pulmonary effort is normal.     Breath sounds: Normal breath sounds. No wheezing or rales.  Chest:     Chest wall: No tenderness.  Abdominal:     General: Bowel sounds are normal. There is no distension.     Palpations: Abdomen is soft. There is no mass.     Tenderness: There is no abdominal tenderness.  Musculoskeletal:        General: Normal range of motion.     Right lower leg: No edema.     Left lower leg: No edema.  Neurological:     Mental Status: She is alert and oriented to person, place, and time.  Psychiatric:        Mood and Affect: Mood normal.    Diabetic Foot Exam - Simple   Simple Foot Form Diabetic Foot exam was performed with the following findings: Yes 07/22/2023  2:52 PM  Visual Inspection No  deformities, no ulcerations, no other skin breakdown bilaterally: Yes Sensation Testing Intact to touch and monofilament testing bilaterally: Yes Pulse Check Posterior Tibialis and Dorsalis pulse intact bilaterally: Yes Comments        Latest Ref Rng & Units 06/23/2023   11:25 PM 01/12/2023   12:10 PM 10/07/2022    9:48 AM  CMP  Glucose 70 - 99 mg/dL 161  096  045   BUN 8 - 23 mg/dL 11  14  14    Creatinine 0.44 - 1.00 mg/dL 4.09  8.11  9.14   Sodium 135 - 145 mmol/L 137  141  138   Potassium 3.5 - 5.1 mmol/L 3.6  4.8  4.5   Chloride 98 - 111 mmol/L 102  103  101   CO2 22 - 32 mmol/L 23  24  22    Calcium 8.9 - 10.3 mg/dL 9.3  9.5  9.4   Total Protein 6.5 - 8.1 g/dL 7.5  7.3  7.0   Total Bilirubin 0.0 - 1.2 mg/dL 0.6  0.3  0.4   Alkaline Phos 38 - 126 U/L 72  85  89   AST 15 - 41 U/L 37  16  19   ALT 0 - 44 U/L 31  18  24      Lipid Panel     Component Value Date/Time   CHOL 218 (H) 01/12/2023 1210   TRIG 180 (H) 01/12/2023 1210   HDL 51 01/12/2023 1210   CHOLHDL 4.3 01/28/2022 1612   CHOLHDL 3.9 04/08/2016 1025   VLDL 12 04/08/2016 1025   LDLCALC 135 (H) 01/12/2023 1210    CBC    Component Value Date/Time   WBC 6.3 06/23/2023 2325   RBC 4.54 06/23/2023 2325   HGB  13.8 06/23/2023 2325   HGB 13.2 06/20/2021 1132   HCT 41.7 06/23/2023 2325   HCT 39.5 06/20/2021 1132   PLT 224 06/23/2023 2325   PLT 268 06/20/2021 1132   MCV 91.9 06/23/2023 2325   MCV 93 06/20/2021 1132   MCH 30.4 06/23/2023 2325   MCHC 33.1 06/23/2023 2325   RDW 13.2 06/23/2023 2325   RDW 12.8 06/20/2021 1132   LYMPHSABS 2.2 06/23/2023 2325   LYMPHSABS 1.6 06/20/2021 1132   MONOABS 1.3 (H) 06/23/2023 2325   EOSABS 0.0 06/23/2023 2325   EOSABS 0.1 06/20/2021 1132   BASOSABS 0.0 06/23/2023 2325   BASOSABS 0.1 06/20/2021 1132    Lab Results  Component Value Date   HGBA1C 6.2 07/22/2023    Lab Results  Component Value Date   TSH 1.890 03/08/2020    The 10-year ASCVD risk score (Arnett  DK, et al., 2019) is: 11.5%   Values used to calculate the score:     Age: 84 years     Sex: Female     Is Non-Hispanic African American: No     Diabetic: Yes     Tobacco smoker: No     Systolic Blood Pressure: 129 mmHg     Is BP treated: Yes     HDL Cholesterol: 51 mg/dL     Total Cholesterol: 218 mg/dL     Assessment & Plan Blurry Vision Blurry vision resolved after a month. Stable A1c suggests vision changes not directly related to diabetes. Transient hyperglycemia considered but less likely due to stable A1c. - Monitor blood glucose levels regularly. - Encourage dietary control to maintain stable blood glucose levels. - Consider follow-up with an ophthalmologist if symptoms recur.  Type II Diabetes Mellitus A1c of 6.2 indicates well-managed diabetes through diet and lifestyle. Emphasized maintaining control to prevent complications. - Continue dietary management and lifestyle modifications. - Encourage regular physical activity, such as walking, to help control blood glucose levels. -Counseled on Diabetic diet, my plate method, 161 minutes of moderate intensity exercise/week Blood sugar logs with fasting goals of 80-120 mg/dl, random of less than 096 and in the event of sugars less than 60 mg/dl or greater than 045 mg/dl encouraged to notify the clinic. Advised on the need for annual eye exams, annual foot exams, Pneumonia vaccine.   Hyperlipidemia Discontinued atorvastatin due to stomach upset. Explained importance of atorvastatin to reduce cardiovascular risk, especially with diabetes and aortic plaque.  Year ASCVD risk score is 11.5%. - Resume atorvastatin (Lipitor) 40 mg orally daily. - Order fasting lipid panel. - Check liver function. - Educate on the importance of continuing atorvastatin despite normal cholesterol levels.  Hypertension Blood pressure well-controlled. - Continue amlodipine as prescribed. -Counseled on blood pressure goal of less than 130/80,  low-sodium, DASH diet, medication compliance, 150 minutes of moderate intensity exercise per week. Discussed medication compliance, adverse effects.   Heat intolerance -Will check thyroid labs -Hot flashes could also explain this  General Health Maintenance Gained five pounds since September. Encouraged physical activity and strength training for weight loss and health improvement.  - Encourage regular physical activity, including walking and strength training. - Encourage weight loss through diet and exercise.  Follow-up Advised follow-up for fasting lipid panel and liver function test. Results via MyChart. Return in six months unless issues arise. - Schedule fasting lipid panel and liver function tests for tomorrow at 8:30 AM. - Follow up in six months or as needed.      Meds ordered this encounter  Medications   atorvastatin (LIPITOR) 40 MG tablet    Sig: Take 1 tablet (40 mg total) by mouth daily.    Dispense:  90 tablet    Refill:  1   benazepril (LOTENSIN) 20 MG tablet    Sig: Take 1 tablet (20 mg total) by mouth daily.    Dispense:  90 tablet    Refill:  1    Follow-up: No follow-ups on file.       Hoy Register, MD, FAAFP. [facility] [place], [place] [phone]   07/22/2023, 2:52 PM

## 2023-07-23 ENCOUNTER — Ambulatory Visit: Attending: Family Medicine

## 2023-07-23 ENCOUNTER — Other Ambulatory Visit: Payer: Self-pay

## 2023-07-23 ENCOUNTER — Other Ambulatory Visit: Payer: Self-pay | Admitting: Family Medicine

## 2023-07-23 DIAGNOSIS — R6889 Other general symptoms and signs: Secondary | ICD-10-CM

## 2023-07-23 DIAGNOSIS — E1169 Type 2 diabetes mellitus with other specified complication: Secondary | ICD-10-CM

## 2023-07-24 ENCOUNTER — Encounter: Payer: Self-pay | Admitting: Family Medicine

## 2023-07-24 LAB — T3: T3, Total: 154 ng/dL (ref 71–180)

## 2023-07-24 LAB — T4, FREE: Free T4: 0.99 ng/dL (ref 0.82–1.77)

## 2023-07-24 LAB — LP+NON-HDL CHOLESTEROL
Cholesterol, Total: 219 mg/dL — ABNORMAL HIGH (ref 100–199)
HDL: 44 mg/dL (ref >39)
LDL Chol Calc (NIH): 136 mg/dL — ABNORMAL HIGH (ref 0–99)
Total Non-HDL-Chol (LDL+VLDL): 175 mg/dL — ABNORMAL HIGH (ref 0–129)
Triglycerides: 218 mg/dL — ABNORMAL HIGH (ref 0–149)
VLDL Cholesterol Cal: 39 mg/dL (ref 5–40)

## 2023-07-24 LAB — MICROALBUMIN / CREATININE URINE RATIO
Creatinine, Urine: 101.1 mg/dL
Microalb/Creat Ratio: <3 mg/g{creat} (ref 0–29)
Microalbumin, Urine: <3 ug/mL

## 2023-07-24 LAB — TSH: TSH: 1.68 u[IU]/mL (ref 0.450–4.500)

## 2023-09-03 ENCOUNTER — Other Ambulatory Visit: Payer: Self-pay

## 2023-10-13 ENCOUNTER — Encounter: Payer: Self-pay | Admitting: Family Medicine

## 2023-11-17 ENCOUNTER — Other Ambulatory Visit: Payer: Self-pay | Admitting: Family Medicine

## 2023-11-17 ENCOUNTER — Encounter: Payer: Self-pay | Admitting: Family Medicine

## 2023-11-17 DIAGNOSIS — M25551 Pain in right hip: Secondary | ICD-10-CM

## 2023-11-20 ENCOUNTER — Telehealth: Payer: Self-pay | Admitting: Family Medicine

## 2023-11-20 NOTE — Telephone Encounter (Signed)
 Copied from CRM #[id number]. Topic: Clinical - Request for Lab/Test Order >> Nov 20, 2023 10:17 AM Delon DASEN wrote:  Reason for CRM: Patient needs to schedule xray- orders have been put in - [phone]

## 2023-11-24 ENCOUNTER — Ambulatory Visit
Admission: RE | Admit: 2023-11-24 | Discharge: 2023-11-24 | Disposition: A | Discharge status: Home / Self Care | Source: Ambulatory Visit | Attending: Family Medicine | Admitting: Family Medicine

## 2023-11-24 DIAGNOSIS — M25551 Pain in right hip: Secondary | ICD-10-CM

## 2023-11-24 NOTE — Telephone Encounter (Signed)
 Patient has been called and given contact information for [facility].

## 2023-11-30 ENCOUNTER — Other Ambulatory Visit: Payer: Self-pay

## 2023-11-30 ENCOUNTER — Ambulatory Visit: Payer: Self-pay | Admitting: Family Medicine

## 2023-11-30 ENCOUNTER — Other Ambulatory Visit: Payer: Self-pay | Admitting: Family Medicine

## 2023-11-30 DIAGNOSIS — M25551 Pain in right hip: Secondary | ICD-10-CM

## 2023-12-01 ENCOUNTER — Other Ambulatory Visit: Payer: Self-pay

## 2023-12-01 MED ORDER — ACCU-CHEK GUIDE TEST VI STRP
ORAL_STRIP | 0 refills | Status: AC
  Filled 2023-12-01: qty 100, 90d supply, fill #0

## 2023-12-01 NOTE — Telephone Encounter (Signed)
 Requested medication (s) are due for refill today: routing for review  Requested medication (s) are on the active medication list: yes  Last refill:  n/a  Future visit scheduled: no  Notes to clinic:  unable to attach protocol, routing for approval.     Requested Prescriptions  Pending Prescriptions Disp Refills   glucose blood (ACCU-CHEK GUIDE TEST) test strip 100 each 0    Sig: Use to check blood sugar once daily.     There is no refill protocol information for this order

## 2023-12-03 ENCOUNTER — Other Ambulatory Visit: Payer: Self-pay

## 2023-12-09 ENCOUNTER — Encounter: Payer: Self-pay | Admitting: Orthopaedic Surgery

## 2023-12-09 ENCOUNTER — Ambulatory Visit: Admitting: Orthopaedic Surgery

## 2023-12-09 DIAGNOSIS — M25551 Pain in right hip: Secondary | ICD-10-CM | POA: Insufficient documentation

## 2023-12-09 MED ORDER — METHYLPREDNISOLONE 4 MG PO TBPK
ORAL_TABLET | ORAL | 0 refills | Status: AC
Start: 2023-12-09 — End: ?

## 2023-12-09 NOTE — Progress Notes (Signed)
 Office Visit Note   Patient: Christina Burke           Date of Birth: 1960-07-13           MRN: [id number] Visit Date: 12/09/2023              Requested by: Delbert Clam, MD [address] [place] [address] [place],  [place] [address] PCP: Delbert Clam, MD   Assessment & Plan: Visit Diagnoses:  1. Pain in right hip     Plan: History of Present Illness Christina Burke is a 63 year old female who presents with right hip and leg pain following a twisting injury.  She experiences pain primarily in the right hip, radiating to the knee, back, and groin, following a twisting injury while pulling a door. The pain is described as 'anophelous' and causes difficulty walking. Tenderness is present in the hip area. The back pain, which has been long-standing, feels more irritated currently. The pain does not affect her ability to navigate stairs. She has been taking collagen supplements but is unsure of their efficacy.  Physical Exam MUSCULOSKELETAL: Pain on hip flexion, pain on hip rotation, no pain on bursa palpation, pain on resisted hip flexion.  Results RADIOLOGY Hip X-ray: Mild arthritic changes present.  Assessment and Plan Right hip osteoarthritis with acute exacerbation.  Could be manifestation of lumbar spine issue.   Acute exacerbation following twisting injury. X-rays show arthritic changes. Pain moderate, prefers oral medication. - Prescribe steroid dose pack. - Explained treatment options including steroid dose pack and cortisone shot. Chose steroid dose pack due to moderate pain and preference for oral medication. - Explained cortisone shot provides quicker relief but involves deeper injection. - Advised collagen supplements beneficial, glucosamine not significantly effective.  Follow-Up Instructions: No follow-ups on file.   Orders:  No orders of the defined types were placed in this encounter.  Meds ordered this encounter  Medications   methylPREDNISolone  (MEDROL   DOSEPAK) 4 MG TBPK tablet    Sig: Take as directed    Dispense:  21 tablet    Refill:  0      Procedures: No procedures performed   Clinical Data: No additional findings.   Subjective: Chief Complaint  Patient presents with   Right Hip - Pain    HPI  Review of Systems  Constitutional: Negative.   HENT: Negative.    Eyes: Negative.   Respiratory: Negative.    Cardiovascular: Negative.   Endocrine: Negative.   Musculoskeletal: Negative.   Neurological: Negative.   Hematological: Negative.   Psychiatric/Behavioral: Negative.    All other systems reviewed and are negative.    Objective: Vital Signs: There were no vitals taken for this visit.  Physical Exam Vitals and nursing note reviewed.  Constitutional:      Appearance: She is well-developed.  HENT:     Head: Atraumatic.     Nose: Nose normal.  Eyes:     Extraocular Movements: Extraocular movements intact.  Cardiovascular:     Pulses: Normal pulses.  Pulmonary:     Effort: Pulmonary effort is normal.  Abdominal:     Palpations: Abdomen is soft.  Musculoskeletal:     Cervical back: Neck supple.  Skin:    General: Skin is warm.     Capillary Refill: Capillary refill takes less than 2 seconds.  Neurological:     Mental Status: She is alert. Mental status is at baseline.  Psychiatric:        Behavior: Behavior normal.  Thought Content: Thought content normal.        Judgment: Judgment normal.     Ortho Exam  Specialty Comments:  No specialty comments available.  Imaging: No results found.   PMFS History: Patient Active Problem List   Diagnosis Date Noted   Pain in right hip 12/09/2023   Lichen sclerosus 09/27/2021   Type 2 diabetes mellitus (HCC) 09/27/2021   Atherosclerotic cardiovascular disease 02/05/2021   Calculus of gallbladder without cholecystitis without obstruction 07/03/2020   Elevated lipase 07/03/2020   Intermittent diarrhea 07/03/2020   Dysphagia 02/19/2020    Schatzki's ring 02/19/2020   Right flank pain 02/19/2020   Constipation 02/19/2020   Family history of colonic polyps 02/19/2020   Spinal stenosis of cervical region 10/18/2019   Other spondylosis with radiculopathy, cervical region 08/09/2019   Vitamin D deficiency 03/23/2018   Hypertension    Anxiety    Hyperlipidemia    GERD (gastroesophageal reflux disease) 08/10/2014   HTN (hypertension) 01/25/2014   Chest pain 12/08/2013   Murmur 12/08/2013   Past Medical History:  Diagnosis Date   Anxiety    Cancer (HCC)    pt. reports had uterine cancer x 10 years ago   Chest pain 12/08/2013   Gallbladder stone with nonacute cholecystitis    GERD (gastroesophageal reflux disease) 08/10/2014   Hyperlipidemia    Hypertension    Murmur 12/08/2013    Family History  Problem Relation Age of Onset   Gallbladder disease Mother    Heart disease Father    Lung cancer Father    Brain cancer Father    Colon polyps Sister    Skin cancer Sister    Diabetes Sister        x3   Diabetes Brother    Breast cancer Paternal Aunt    Colon cancer Neg Hx    Kidney disease Neg Hx    Esophageal cancer Neg Hx    Inflammatory bowel disease Neg Hx    Liver disease Neg Hx    Pancreatic cancer Neg Hx    Rectal cancer Neg Hx    Stomach cancer Neg Hx     Past Surgical History:  Procedure Laterality Date   ABDOMINAL HYSTERECTOMY     ABDOMINAL SURGERY     COLONOSCOPY     UPPER GASTROINTESTINAL ENDOSCOPY  4/21/216   Social History   Occupational History   Occupation: Unemployed  Tobacco Use   Smoking status: Never    Passive exposure: Never   Smokeless tobacco: Never  Vaping Use   Vaping status: Never Used  Substance and Sexual Activity   Alcohol use: Yes    Alcohol/week: 6.0 standard drinks of alcohol    Types: 6 Cans of beer per week   Drug use: No   Sexual activity: Not Currently    Birth control/protection: None

## 2024-01-20 ENCOUNTER — Encounter: Payer: Self-pay | Admitting: Family Medicine

## 2024-01-20 ENCOUNTER — Other Ambulatory Visit: Payer: Self-pay

## 2024-01-20 ENCOUNTER — Ambulatory Visit: Attending: Family Medicine | Admitting: Family Medicine

## 2024-01-20 VITALS — BP 128/84 | HR 65 | Ht 63.5 in | Wt 225.0 lb

## 2024-01-20 DIAGNOSIS — M25551 Pain in right hip: Secondary | ICD-10-CM | POA: Diagnosis not present

## 2024-01-20 DIAGNOSIS — E785 Hyperlipidemia, unspecified: Secondary | ICD-10-CM

## 2024-01-20 DIAGNOSIS — E119 Type 2 diabetes mellitus without complications: Secondary | ICD-10-CM

## 2024-01-20 DIAGNOSIS — I10 Essential (primary) hypertension: Secondary | ICD-10-CM

## 2024-01-20 DIAGNOSIS — E1169 Type 2 diabetes mellitus with other specified complication: Secondary | ICD-10-CM

## 2024-01-20 LAB — POCT GLYCOSYLATED HEMOGLOBIN (HGB A1C): HbA1c, POC (controlled diabetic range): 6 % (ref 0.0–7.0)

## 2024-01-20 MED ORDER — BENAZEPRIL HCL 20 MG PO TABS
20.0000 mg | ORAL_TABLET | Freq: Every day | ORAL | 1 refills | Status: AC
Start: 2024-01-20 — End: ?
  Filled 2024-01-20 – 2024-03-11 (×2): qty 90, 90d supply, fill #0

## 2024-01-20 NOTE — Progress Notes (Signed)
 Subjective:  Patient ID: Christina Burke, female    DOB: Mar 09, 1961  Age: 63 y.o. MRN: [id number]  CC: Medical Management of Chronic Issues (Hip and leg pain/Back pain)     Discussed the use of AI scribe software for clinical note transcription with the patient, who gave verbal consent to proceed.  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 A steroid dose pack was prescribed which provided some relief but residual pain is 6/10.   Her diabetes is diet controlled with A1c of 6.0 which has decreased from 6.2 previously.  She is doing well on her antihypertensive and her statin.  She continues to see psychiatry for management of her anxiety. Past Medical History:  Diagnosis Date   Anxiety    Cancer (HCC)    pt. reports had uterine cancer x 10 years ago   Chest pain 12/08/2013   Gallbladder stone with nonacute cholecystitis    GERD (gastroesophageal reflux disease) 08/10/2014   Hyperlipidemia    Hypertension    Murmur 12/08/2013    Past Surgical History:  Procedure Laterality Date   ABDOMINAL HYSTERECTOMY     ABDOMINAL SURGERY     COLONOSCOPY     UPPER GASTROINTESTINAL ENDOSCOPY  4/21/216    Family History  Problem Relation Age of Onset   Gallbladder disease Mother    Heart disease Father    Lung cancer Father    Brain cancer Father    Colon polyps Sister    Skin cancer Sister    Diabetes Sister        x3   Diabetes Brother    Breast cancer  Paternal Aunt    Colon cancer Neg Hx    Kidney disease Neg Hx    Esophageal cancer Neg Hx    Inflammatory bowel disease Neg Hx    Liver disease Neg Hx    Pancreatic cancer Neg Hx    Rectal cancer Neg Hx    Stomach cancer Neg Hx     Social History   Socioeconomic History   Marital status: Single    Spouse name: Not on file   Number of children: 2   Years of education: Not on file   Highest education level: Some college, no degree  Occupational History   Occupation: Unemployed  Tobacco Use   Smoking status: Never    Passive exposure: Never   Smokeless tobacco: Never  Vaping Use   Vaping status: Never Used  Substance and Sexual Activity   Alcohol use: Yes    Alcohol/week: 6.0 standard drinks of alcohol    Types: 6 Cans of beer per week   Drug use: No   Sexual activity: Not Currently    Birth control/protection: None  Other Topics Concern   Not on file  Social History Narrative   Right handed   Social Drivers of Health   Financial Resource Strain: Not on file  Food Insecurity: No Food Insecurity (01/08/2021)   Hunger Vital Sign  Worried About Programme researcher, broadcasting/film/video in the Last Year: Never true    Ran Out of Food in the Last Year: Never true  Transportation Needs: No Transportation Needs (01/08/2021)   PRAPARE - Administrator, Civil Service (Medical): No    Lack of Transportation (Non-Medical): No  Physical Activity: Not on file  Stress: Not on file  Social Connections: Not on file    Allergies  Allergen Reactions   Amoxicillin Rash    Has patient had a PCN reaction causing immediate rash, facial/tongue/throat swelling, SOB or lightheadedness with hypotension: yes Has patient had a PCN reaction causing severe rash involving mucus membranes or skin necrosis: no Has patient had a PCN reaction that required hospitalization: no Has patient had a PCN reaction occurring within the last 10 years: no If all of the above answers are NO, then may proceed with  Cephalosporin use.     Outpatient Medications Prior to Visit  Medication Sig Dispense Refill   Accu-Chek Softclix Lancets lancets Use to check blood sugar once daily. 100 each 0   atorvastatin  (LIPITOR) 40 MG tablet Take 1 tablet (40 mg total) by mouth daily. 90 tablet 1   Blood Glucose Monitoring Suppl (ACCU-CHEK GUIDE) w/Device KIT Use to check blood sugar once daily. 1 kit 0   clobetasol  cream (TEMOVATE ) 0.05 % Apply 1 Application topically 2 (two) times daily. Apply to the vulva for 4 weeks. 60 g 0   clonazePAM (KLONOPIN) 1 MG tablet Take 1 mg by mouth 2 (two) times daily.     glucose blood (ACCU-CHEK GUIDE TEST) test strip Use to check blood sugar once daily. 100 each 0   methylPREDNISolone  (MEDROL  DOSEPAK) 4 MG TBPK tablet Take as directed 21 tablet 0   benazepril  (LOTENSIN ) 20 MG tablet Take 1 tablet (20 mg total) by mouth daily. 90 tablet 1   DULoxetine  (CYMBALTA ) 60 MG capsule Take 1 capsule (60 mg total) by mouth 2 (two) times daily. For back pain 60 capsule 3   moxifloxacin  (VIGAMOX ) 0.5 % ophthalmic solution Place 1 drop into the left eye 3 (three) times daily. (Patient not taking: Reported on 01/20/2024) 3 mL 0   No facility-administered medications prior to visit.     ROS Review of Systems  Constitutional:  Negative for activity change and appetite change.  HENT:  Negative for sinus pressure and sore throat.   Respiratory:  Negative for chest tightness, shortness of breath and wheezing.   Cardiovascular:  Negative for chest pain and palpitations.  Gastrointestinal:  Negative for abdominal distention, abdominal pain and constipation.  Genitourinary: Negative.   Musculoskeletal:        See HPI  Psychiatric/Behavioral:  Negative for behavioral problems and dysphoric mood.     Objective:  BP 128/84   Pulse 65   Ht 5' 3.5 (1.613 m)   Wt 225 lb (102.1 kg)   SpO2 97%   BMI 39.23 kg/m      01/20/2024   11:06 AM 07/22/2023    2:33 PM 06/24/2023    3:36 AM  BP/Weight   Systolic BP 128 129 114  Diastolic BP 84 86 75  Wt. (Lbs) 225 228.8   BMI 39.23 kg/m2 39.89 kg/m2       Physical Exam Constitutional:      Appearance: She is well-developed.  Cardiovascular:     Rate and Rhythm: Normal rate.     Heart sounds: Normal heart sounds. No murmur heard. Pulmonary:     Effort: Pulmonary  effort is normal.     Breath sounds: Normal breath sounds. No wheezing or rales.  Chest:     Chest wall: No tenderness.  Abdominal:     General: Bowel sounds are normal. There is no distension.     Palpations: Abdomen is soft. There is no mass.     Tenderness: There is no abdominal tenderness.  Musculoskeletal:        General: Normal range of motion.     Right lower leg: No edema.     Left lower leg: No edema.  Neurological:     Mental Status: She is alert and oriented to person, place, and time.  Psychiatric:        Mood and Affect: Mood normal.        Latest Ref Rng & Units 06/23/2023   11:25 PM 01/12/2023   12:10 PM 10/07/2022    9:48 AM  CMP  Glucose 70 - 99 mg/dL 864  878  888   BUN 8 - 23 mg/dL 11  14  14    Creatinine 0.44 - 1.00 mg/dL 9.00  9.07  8.97   Sodium 135 - 145 mmol/L 137  141  138   Potassium 3.5 - 5.1 mmol/L 3.6  4.8  4.5   Chloride 98 - 111 mmol/L 102  103  101   CO2 22 - 32 mmol/L 23  24  22    Calcium  8.9 - 10.3 mg/dL 9.3  9.5  9.4   Total Protein 6.5 - 8.1 g/dL 7.5  7.3  7.0   Total Bilirubin 0.0 - 1.2 mg/dL 0.6  0.3  0.4   Alkaline Phos 38 - 126 U/L 72  85  89   AST 15 - 41 U/L 37  16  19   ALT 0 - 44 U/L 31  18  24      Lipid Panel     Component Value Date/Time   CHOL 219 (H) 07/23/2023 0909   TRIG 218 (H) 07/23/2023 0909   HDL 44 07/23/2023 0909   CHOLHDL 4.3 01/28/2022 1612   CHOLHDL 3.9 04/08/2016 1025   VLDL 12 04/08/2016 1025   LDLCALC 136 (H) 07/23/2023 0909    CBC    Component Value Date/Time   WBC 6.3 06/23/2023 2325   RBC 4.54 06/23/2023 2325   HGB 13.8 06/23/2023 2325   HGB 13.2 06/20/2021 1132   HCT 41.7  06/23/2023 2325   HCT 39.5 06/20/2021 1132   PLT 224 06/23/2023 2325   PLT 268 06/20/2021 1132   MCV 91.9 06/23/2023 2325   MCV 93 06/20/2021 1132   MCH 30.4 06/23/2023 2325   MCHC 33.1 06/23/2023 2325   RDW 13.2 06/23/2023 2325   RDW 12.8 06/20/2021 1132   LYMPHSABS 2.2 06/23/2023 2325   LYMPHSABS 1.6 06/20/2021 1132   MONOABS 1.3 (H) 06/23/2023 2325   EOSABS 0.0 06/23/2023 2325   EOSABS 0.1 06/20/2021 1132   BASOSABS 0.0 06/23/2023 2325   BASOSABS 0.1 06/20/2021 1132    Lab Results  Component Value Date   HGBA1C 6.0 01/20/2024    Lab Results  Component Value Date   HGBA1C 6.0 01/20/2024   HGBA1C 6.2 07/22/2023   HGBA1C 6.2 (H) 01/12/2023      1. Type 2 diabetes mellitus with other specified complication, without long-term current use of insulin (HCC) (Primary) Diet controlled with A1c of 6.0 Counseled on Diabetic diet, the healthy plate, 849 minutes of moderate intensity exercise/week Blood sugar logs with fasting goals of 80-120 mg/dl,  random of less than 180 and in the event of sugars less than 60 mg/dl or greater than 599 mg/dl encouraged to notify the clinic. Advised on the need for annual eye exams, annual foot exams, Pneumonia vaccine. - POCT glycosylated hemoglobin (Hb A1C) - Ambulatory referral to Ophthalmology - CMP14+EGFR - LP+Non-HDL Cholesterol  2. Essential hypertension Controlled Counseled on blood pressure goal of less than 130/80, low-sodium, DASH diet, medication compliance, 150 minutes of moderate intensity exercise per week. Discussed medication compliance, adverse effects. - benazepril  (LOTENSIN ) 20 MG tablet; Take 1 tablet (20 mg total) by mouth daily.  Dispense: 90 tablet; Refill: 1  3. Right hip pain Pain is still present but has improved Advised that if pain worsens she can reach back out to orthopedic for another appointment for possible steroid injection  4. Hyperlipidemia associated with type 2 diabetes mellitus (HCC) LDL of 136 above  goal of less than 100 from last set of labs Will check lipid panel today Continue statin - LP+Non-HDL Cholesterol     Meds ordered this encounter  Medications   benazepril  (LOTENSIN ) 20 MG tablet    Sig: Take 1 tablet (20 mg total) by mouth daily.    Dispense:  90 tablet    Refill:  1    Follow-up: Return in about 6 months (around 07/19/2024) for Chronic medical conditions.       Corrina Sabin, MD, FAAFP. [facility] [place], [place] [phone]   01/20/2024, 1:18 PM

## 2024-01-20 NOTE — Patient Instructions (Signed)
 Dyslipidemia Dyslipidemia is an imbalance of waxy, fat-like substances (lipids) in the blood. The body needs lipids in small amounts. Dyslipidemia often involves a high level of cholesterol or triglycerides, which are types of lipids. Common forms of dyslipidemia include: High levels of LDL cholesterol. LDL is the type of cholesterol that causes fatty deposits (plaques) to build up in the blood vessels that carry blood away from the heart (arteries). Low levels of HDL cholesterol. HDL cholesterol is the type of cholesterol that protects against heart disease. High levels of HDL remove the LDL buildup from arteries. High levels of triglycerides. Triglycerides are a fatty substance in the blood that is linked to a buildup of plaques in the arteries. What are the causes? There are two main types of dyslipidemia: primary and secondary. Primary dyslipidemia is caused by changes (mutations) in genes that are passed down through families (inherited). These mutations cause several types of dyslipidemia. Secondary dyslipidemia may be caused by various risk factors that can lead to the disease, such as lifestyle choices and certain medical conditions. What increases the risk? You are more likely to develop this condition if you are an older man or if you are a woman who has gone through menopause. Other risk factors include: Having a family history of dyslipidemia. Taking certain medicines, including birth control pills, steroids, some diuretics, and beta-blockers. Eating a diet high in saturated fat. Smoking cigarettes or excessive alcohol intake. Having certain medical conditions such as diabetes, polycystic ovary syndrome (PCOS), kidney disease, liver disease, or hypothyroidism. Not exercising regularly. Being overweight or obese with too much belly fat. What are the signs or symptoms? In most cases, dyslipidemia does not usually cause any symptoms. In severe cases, very high lipid levels can  cause: Fatty bumps under the skin (xanthomas). A white or gray ring around the black center (pupil) of the eye. Very high triglyceride levels can cause inflammation of the pancreas (pancreatitis). How is this diagnosed? Your health care provider may diagnose dyslipidemia based on a routine blood test (fasting blood test). Because most people do not have symptoms of the condition, this blood testing (lipid profile) is done on adults age 63 and older and is repeated every 4-6 years. This test checks: Total cholesterol. This measures the total amount of cholesterol in your blood, including LDL cholesterol, HDL cholesterol, and triglycerides. A healthy number is below 200 mg/dL (4.82 mmol/L). LDL cholesterol. The target number for LDL cholesterol is different for each person, depending on individual risk factors. A healthy number is usually below 100 mg/dL (7.40 mmol/L). Ask your health care provider what your LDL cholesterol should be. HDL cholesterol. An HDL level of 60 mg/dL (8.44 mmol/L) or higher is best because it helps to protect against heart disease. A number below 40 mg/dL (8.96 mmol/L) for men or below 50 mg/dL (8.70 mmol/L) for women increases the risk for heart disease. Triglycerides. A healthy triglyceride number is below 150 mg/dL (8.30 mmol/L). If your lipid profile is abnormal, your health care provider may do other blood tests. How is this treated? Treatment depends on the type of dyslipidemia that you have and your other risk factors for heart disease and stroke. Your health care provider will have a target range for your lipid levels based on this information. Treatment for dyslipidemia starts with lifestyle changes, such as diet and exercise. Your health care provider may recommend that you: Get regular exercise. Make changes to your diet. Quit smoking if you smoke. Limit your alcohol intake. If diet  changes and exercise do not help you reach your goals, your health care provider  may also prescribe medicine to lower lipids. The most commonly prescribed type of medicine lowers your LDL cholesterol (statin drug). If you have a high triglyceride level, your provider may prescribe another type of drug (fibrate) or an omega-3 fish oil supplement, or both. Follow these instructions at home: Eating and drinking  Follow instructions from your health care provider or dietitian about eating or drinking restrictions. Eat a healthy diet as told by your health care provider. This can help you reach and maintain a healthy weight, lower your LDL cholesterol, and raise your HDL cholesterol. This may include: Limiting your calories, if you are overweight. Eating more fruits, vegetables, whole grains, fish, and lean meats. Limiting saturated fat, trans fat, and cholesterol. Do not drink alcohol if: Your health care provider tells you not to drink. You are pregnant, may be pregnant, or are planning to become pregnant. If you drink alcohol: Limit how much you have to: 0-1 drink a day for women. 0-2 drinks a day for men. Know how much alcohol is in your drink. In the [place] one drink equals one 12 oz bottle of beer (355 mL), one 5 oz glass of wine (148 mL), or one 1 oz glass of hard liquor (44 mL). Activity Get regular exercise. Start an exercise and strength training program as told by your health care provider. Ask your health care provider what activities are safe for you. Your health care provider may recommend: 30 minutes of aerobic activity 4-6 days a week. Brisk walking is an example of aerobic activity. Strength training 2 days a week. General instructions Do not use any products that contain nicotine or tobacco. These products include cigarettes, chewing tobacco, and vaping devices, such as e-cigarettes. If you need help quitting, ask your health care provider. Take over-the-counter and prescription medicines only as told by your health care provider. This includes  supplements. Keep all follow-up visits. This is important. Contact a health care provider if: You are having trouble sticking to your exercise or diet plan. You are struggling to quit smoking or to control your use of alcohol. Summary Dyslipidemia often involves a high level of cholesterol or triglycerides, which are types of lipids. Treatment depends on the type of dyslipidemia that you have and your other risk factors for heart disease and stroke. Treatment for dyslipidemia starts with lifestyle changes, such as diet and exercise. Your health care provider may prescribe medicine to lower lipids. This information is not intended to replace advice given to you by your health care provider. Make sure you discuss any questions you have with your health care provider. Document Revised: 11/15/2021 Document Reviewed: 06/18/2020 Elsevier Patient Education  2025 [employer].

## 2024-01-21 ENCOUNTER — Ambulatory Visit: Payer: Self-pay | Admitting: Family Medicine

## 2024-01-21 LAB — CMP14+EGFR
ALT: 49 IU/L — ABNORMAL HIGH (ref 0–32)
AST: 32 IU/L (ref 0–40)
Albumin: 4.5 g/dL (ref 3.9–4.9)
Alkaline Phosphatase: 151 IU/L — ABNORMAL HIGH (ref 49–135)
BUN/Creatinine Ratio: 16 (ref 12–28)
BUN: 13 mg/dL (ref 8–27)
Bilirubin Total: 0.4 mg/dL (ref 0.0–1.2)
CO2: 22 mmol/L (ref 20–29)
Calcium: 9.6 mg/dL (ref 8.7–10.3)
Chloride: 104 mmol/L (ref 96–106)
Creatinine, Ser: 0.8 mg/dL (ref 0.57–1.00)
Globulin, Total: 2.4 g/dL (ref 1.5–4.5)
Glucose: 119 mg/dL — ABNORMAL HIGH (ref 70–99)
Potassium: 4.6 mmol/L (ref 3.5–5.2)
Sodium: 140 mmol/L (ref 134–144)
Total Protein: 6.9 g/dL (ref 6.0–8.5)
eGFR: 83 mL/min/1.73 (ref >59)

## 2024-01-21 LAB — LP+NON-HDL CHOLESTEROL
Cholesterol, Total: 138 mg/dL (ref 100–199)
HDL: 49 mg/dL (ref >39)
LDL Chol Calc (NIH): 68 mg/dL (ref 0–99)
Total Non-HDL-Chol (LDL+VLDL): 89 mg/dL (ref 0–129)
Triglycerides: 117 mg/dL (ref 0–149)
VLDL Cholesterol Cal: 21 mg/dL (ref 5–40)

## 2024-02-29 ENCOUNTER — Encounter: Payer: Self-pay | Admitting: Radiology

## 2024-03-11 ENCOUNTER — Other Ambulatory Visit: Payer: Self-pay

## 2024-05-03 ENCOUNTER — Other Ambulatory Visit: Payer: Self-pay

## 2024-05-03 ENCOUNTER — Encounter: Payer: Self-pay | Admitting: Pharmacist

## 2024-05-06 ENCOUNTER — Other Ambulatory Visit: Payer: Self-pay

## 2024-05-09 ENCOUNTER — Encounter: Payer: Self-pay | Admitting: *Deleted

## 2024-05-09 ENCOUNTER — Other Ambulatory Visit: Payer: Self-pay

## 2024-07-19 ENCOUNTER — Ambulatory Visit: Admitting: Family Medicine
